# Patient Record
Sex: Male | Born: 1955
Health system: Southern US, Community
[De-identification: ages and names within clinical notes are randomized; demographics above are authoritative.]

## PROBLEM LIST (undated history)

## (undated) DIAGNOSIS — G4730 Sleep apnea, unspecified: Secondary | ICD-10-CM

## (undated) DIAGNOSIS — G510 Bell's palsy: Secondary | ICD-10-CM

## (undated) DIAGNOSIS — D696 Thrombocytopenia, unspecified: Secondary | ICD-10-CM

## (undated) DIAGNOSIS — E7800 Pure hypercholesterolemia, unspecified: Secondary | ICD-10-CM

## (undated) DIAGNOSIS — J309 Allergic rhinitis, unspecified: Secondary | ICD-10-CM

## (undated) DIAGNOSIS — K219 Gastro-esophageal reflux disease without esophagitis: Secondary | ICD-10-CM

## (undated) DIAGNOSIS — J45909 Unspecified asthma, uncomplicated: Secondary | ICD-10-CM

## (undated) DIAGNOSIS — C439 Malignant melanoma of skin, unspecified: Secondary | ICD-10-CM

## (undated) DIAGNOSIS — M109 Gout, unspecified: Secondary | ICD-10-CM

## (undated) DIAGNOSIS — M1990 Unspecified osteoarthritis, unspecified site: Secondary | ICD-10-CM

## (undated) DIAGNOSIS — D369 Benign neoplasm, unspecified site: Secondary | ICD-10-CM

## (undated) HISTORY — DX: Unspecified asthma, uncomplicated: J45.909

## (undated) HISTORY — DX: Malignant melanoma of skin, unspecified: C43.9

## (undated) HISTORY — DX: Allergic rhinitis, unspecified: J30.9

## (undated) HISTORY — PX: MELANOMA EXCISION: SHX5266

## (undated) HISTORY — DX: Bell's palsy: G51.0

## (undated) HISTORY — DX: Pure hypercholesterolemia, unspecified: E78.00

## (undated) HISTORY — PX: NASAL RECONSTRUCTION: SHX2069

## (undated) HISTORY — DX: Thrombocytopenia, unspecified: D69.6

## (undated) HISTORY — DX: Benign neoplasm, unspecified site: D36.9

## (undated) HISTORY — DX: Unspecified osteoarthritis, unspecified site: M19.90

## (undated) HISTORY — DX: Gastro-esophageal reflux disease without esophagitis: K21.9

## (undated) HISTORY — DX: Sleep apnea, unspecified: G47.30

## (undated) HISTORY — DX: Gout, unspecified: M10.9

---

## 1991-05-06 HISTORY — PX: KNEE SURGERY: SHX244

## 1997-05-05 HISTORY — PX: VASECTOMY: SHX75

## 1999-12-11 ENCOUNTER — Emergency Department (HOSPITAL_COMMUNITY): Admission: EM | Admit: 1999-12-11 | Discharge: 1999-12-11 | Payer: Self-pay | Admitting: Emergency Medicine

## 2002-10-16 ENCOUNTER — Ambulatory Visit (HOSPITAL_BASED_OUTPATIENT_CLINIC_OR_DEPARTMENT_OTHER): Admission: RE | Admit: 2002-10-16 | Discharge: 2002-10-16 | Payer: Self-pay | Admitting: Family Medicine

## 2009-08-09 ENCOUNTER — Emergency Department (HOSPITAL_COMMUNITY): Admission: EM | Admit: 2009-08-09 | Discharge: 2009-08-10 | Payer: Self-pay | Admitting: Emergency Medicine

## 2009-08-18 ENCOUNTER — Encounter: Payer: Self-pay | Admitting: Emergency Medicine

## 2009-08-18 ENCOUNTER — Ambulatory Visit: Payer: Self-pay | Admitting: Diagnostic Radiology

## 2009-08-18 ENCOUNTER — Ambulatory Visit (HOSPITAL_COMMUNITY): Admission: EM | Admit: 2009-08-18 | Discharge: 2009-08-19 | Payer: Self-pay | Admitting: Emergency Medicine

## 2009-08-19 ENCOUNTER — Ambulatory Visit: Payer: Self-pay | Admitting: Gastroenterology

## 2009-08-21 ENCOUNTER — Encounter (INDEPENDENT_AMBULATORY_CARE_PROVIDER_SITE_OTHER): Payer: Self-pay | Admitting: *Deleted

## 2009-08-21 ENCOUNTER — Telehealth (INDEPENDENT_AMBULATORY_CARE_PROVIDER_SITE_OTHER): Payer: Self-pay | Admitting: *Deleted

## 2009-08-21 DIAGNOSIS — K222 Esophageal obstruction: Secondary | ICD-10-CM | POA: Insufficient documentation

## 2009-09-13 ENCOUNTER — Ambulatory Visit (HOSPITAL_COMMUNITY): Admission: RE | Admit: 2009-09-13 | Discharge: 2009-09-13 | Payer: Self-pay | Admitting: Gastroenterology

## 2009-09-13 ENCOUNTER — Ambulatory Visit: Payer: Self-pay | Admitting: Gastroenterology

## 2010-06-04 NOTE — Letter (Signed)
Summary: EGD Instructions   Gastroenterology  [address] [place], [place] [address]   Phone: [phone]  Fax: [phone]       [name]    Sep 03, 1955    MRN: [id number]       Procedure Day /Date:09/13/09     Arrival Time: 630 am     Procedure Time:730 am     Location of Procedure:                     X  [facility] ( Outpatient Registration)    PREPARATION FOR ENDOSCOPY   On 09/13/09  THE DAY OF THE PROCEDURE:  1.   No solid foods, milk or milk products are allowed after midnight the night before your procedure.  2.   Do not drink anything colored red or purple.  Avoid juices with pulp.  No orange juice.  3.  You may drink clear liquids until 330 am , which is 4 hours before your procedure.                                                                                                CLEAR LIQUIDS INCLUDE: Water Jello Ice Popsicles Tea (sugar ok, no milk/cream) Powdered fruit flavored drinks Coffee (sugar ok, no milk/cream) Gatorade Juice: apple, white grape, white cranberry  Lemonade Clear bullion, consomm, broth Carbonated beverages (any kind) Strained chicken noodle soup Hard Candy   MEDICATION INSTRUCTIONS  Unless otherwise instructed, you should take regular prescription medications with a small sip of water as early as possible the morning of your procedure             OTHER INSTRUCTIONS  You will need a responsible adult at least 55 years of age to accompany you and drive you home.   This person must remain in the waiting room during your procedure.  Wear loose fitting clothing that is easily removed.  Leave jewelry and other valuables at home.  However, you may wish to bring a book to read or an iPod/MP3 player to listen to music as you wait for your procedure to start.  Remove all body piercing jewelry and leave at home.  Total time from sign-in until discharge is approximately 2-3 hours.  You should go home  directly after your procedure and rest.  You can resume normal activities the day after your procedure.  The day of your procedure you should not:   Drive   Make legal decisions   Operate machinery   Drink alcohol   Return to work  You will receive specific instructions about eating, activities and medications before you leave.    The above instructions have been reviewed and explained to me by   Chales Abrahams CMA Duncan Dull)  August 21, 2009 4:23 PM     I fully understand and can verbalize these instructions over the phone mailed to home Date 08/21/09

## 2010-06-04 NOTE — Procedures (Signed)
Summary: Upper Endoscopy  Patient: [name] Note: All result statuses are Final unless otherwise noted.  Tests: (1) Upper Endoscopy (EGD)   EGD Upper Endoscopy       DONE     [facility]     [address] [place], [place]  [address]           ENDOSCOPY PROCEDURE REPORT           PATIENT:  Joseph Powell, Joseph Powell  MR#:  [id number]     BIRTHDATE:  10-Feb-1956, 53 yrs. old  GENDER:  male     ENDOSCOPIST:  Rachael Fee, MD     PROCEDURE DATE:  09/13/2009     PROCEDURE:  EGD with balloon dilatation     ASA CLASS:  Class II     INDICATIONS:  recent food impaction above Schatzki's ring (2-3     weeks ago); here for dilation     MEDICATIONS:  Fentanyl 100 mcg IV, Versed 7 mg IV     TOPICAL ANESTHETIC:  none           DESCRIPTION OF PROCEDURE:   After the risks benefits and     alternatives of the procedure were thoroughly explained, informed     consent was obtained.  The  endoscope was introduced through the     mouth and advanced to the second portion of the duodenum, without     limitations.  The instrument was slowly withdrawn as the mucosa     was fully examined.     <<PROCEDUREIMAGES>>     There was a minor, partially obstricting Schatzki's ring above     otherwise normal GE junction. This was dilated up to 20mm (60Fr)     with a CRE TTS balloon held inflated for one minute (see image1).     Mild, non-specific bulbar duodenitis (see image3).  Otherwise the     examination was normal (see image2, image4, and image5).     Retroflexed views revealed no abnormalities.    The scope was then     withdrawn from the patient and the procedure completed.           COMPLICATIONS:  None           ENDOSCOPIC IMPRESSION:     1) Schatzki's ring, small. This was dilated up to 20 mm with CRE     TTS balloon     2) Mild, non-specific bulbar duodenitis     3) Otherwise normal examination           RECOMMENDATIONS:     OK to decrease protonix to once daily, best taken 20-30  min     prior to a meal.     Continue to chew food well, eat slowly.     Return for repeat dilation as needed.           ______________________________     Rachael Fee, MD           n.     eSIGNED:   Rachael Fee at 09/13/2009 07:48 AM           Steva Colder, [id number]  Note: An exclamation mark (!) indicates a result that was not dispersed into the flowsheet. Document Creation Date: 09/13/2009 7:49 AM _______________________________________________________________________  (1) Order result status: Final Collection or observation date-time: 09/13/2009 07:42 Requested date-time:  Receipt date-time:  Reported date-time:  Referring Physician:   Ordering Physician: Rob Bunting [phone]) Specimen  Source:  Source: Launa Grill Order Number: [id number] Lab site:

## 2010-06-04 NOTE — Progress Notes (Signed)
Summary: EGD  Phone Note Outgoing Call Call back at [facility] Phone [phone] Call back at Work Phone [phone]   Call placed by: Chales Abrahams CMA Duncan Dull),  August 21, 2009 4:05 PM Summary of Call: left message on machine to call back regarding 2 week repeat of EGD Initial call taken by: Chales Abrahams CMA Duncan Dull),  August 21, 2009 4:06 PM  Follow-up for Phone Call        pt returned call and was scheduled for repeat EGD with Ballon Dil.  Meds reviewed and instructed pt.  Mailed to the home. Follow-up by: Chales Abrahams CMA Duncan Dull),  August 21, 2009 4:22 PM  New Problems: SCHATZKI'S RING (ICD-530.3)   New Problems: SCHATZKI'S RING (ICD-530.3)

## 2010-06-04 NOTE — Procedures (Signed)
Summary: Upper Endoscopy  Patient: Joseph Powell Note: All result statuses are Final unless otherwise noted.  Tests: (1) Upper Endoscopy (EGD)   EGD Upper Endoscopy       DONE     [facility]     [address] [place], [place]  [address]           ENDOSCOPY PROCEDURE REPORT           PATIENT:  Rossie, Bretado  MR#:  [id number]     BIRTHDATE:  1955/09/30, 53 yrs. old  GENDER:  male     ENDOSCOPIST:  Rachael Fee, MD     PROCEDURE DATE:  08/19/2009     PROCEDURE:  EGD with foreign body removal     ASA CLASS:  Class II     INDICATIONS:  food impaction (chicken, while eating dinner several     hours ago)     MEDICATIONS:  Fentanyl 75 mcg IV, Versed 7 mg IV     TOPICAL ANESTHETIC:  Cetacaine Spray           DESCRIPTION OF PROCEDURE:   After the risks benefits and     alternatives of the procedure were thoroughly explained, informed     consent was obtained.  The EG-[id number] ([id number]) endoscope was     introduced through the mouth and advanced to the stomach antrum,     without limitations.  The instrument was slowly withdrawn as the     mucosa was fully examined.     <<PROCEDUREIMAGES>>     There was a large bolus of whitish meat in distal esophagus. This     was able to be gently pushed into the stomach with gastroscope. A     slightly obstructing Schatzki's ring was noted, surrounded by     acute appearing edema (see image001, image002, and image005).     There was retained solid and liquid food in stomach (see     image003).  Otherwise the examination was normal (see image004).     Retroflexed views revealed no abnormalities.    The scope was then     withdrawn from the patient and the procedure completed.     COMPLICATIONS:  None     ENDOSCOPIC IMPRESSION:     1) Large bolus of chicken in distal esophagus, gently pushed     into stomach     2) Mildly obstructive Schat[place]i's type ring in distal esophagus;     acute edema     3) Otherwise normal  examination           RECOMMENDATIONS:     Dr. Christella Hartigan' office will arrange repeat EGD at [facility] hospital in     about 2 weeks to dilate the narrow area of your esophagus.     Until then, take protonix twice daily.     Chew food very well and eat slowly.           ______________________________     Rachael Fee, MD           n.     eSIGNED:   Rachael Fee at 08/19/2009 02:48 AM           Steva Colder, [id number]  Note: An exclamation mark (!) indicates a result that was not dispersed into the flowsheet. Document Creation Date: 08/20/2009 9:08 AM _______________________________________________________________________  (1) Order result status: Final Collection or observation date-time: 08/19/2009 02:40 Requested date-time:  Receipt date-time:  Reported date-time:  Referring Physician:   Ordering Physician: Rob Bunting [phone]) Specimen Source:  Source: Launa Grill Order Number: [id number] Lab site:

## 2011-04-04 ENCOUNTER — Telehealth: Payer: Self-pay | Admitting: Internal Medicine

## 2011-04-04 NOTE — Telephone Encounter (Signed)
Talked to pt and gave him appt, address and telephone

## 2011-04-13 ENCOUNTER — Other Ambulatory Visit: Payer: Self-pay | Admitting: Internal Medicine

## 2011-04-14 ENCOUNTER — Encounter: Payer: Self-pay | Admitting: Internal Medicine

## 2011-04-14 ENCOUNTER — Ambulatory Visit (HOSPITAL_BASED_OUTPATIENT_CLINIC_OR_DEPARTMENT_OTHER): Payer: BC Managed Care – PPO | Admitting: Internal Medicine

## 2011-04-14 ENCOUNTER — Ambulatory Visit: Payer: BC Managed Care – PPO

## 2011-04-14 ENCOUNTER — Other Ambulatory Visit (HOSPITAL_BASED_OUTPATIENT_CLINIC_OR_DEPARTMENT_OTHER): Payer: BC Managed Care – PPO | Admitting: Lab

## 2011-04-14 VITALS — BP 153/96 | HR 74 | Temp 97.7°F | Ht 71.0 in | Wt 246.4 lb

## 2011-04-14 DIAGNOSIS — D696 Thrombocytopenia, unspecified: Secondary | ICD-10-CM

## 2011-04-14 DIAGNOSIS — R69 Illness, unspecified: Secondary | ICD-10-CM

## 2011-04-14 DIAGNOSIS — M2550 Pain in unspecified joint: Secondary | ICD-10-CM

## 2011-04-14 LAB — CBC WITH DIFFERENTIAL/PLATELET
BASO%: 0.4 % (ref 0.0–2.0)
EOS%: 0.6 % (ref 0.0–7.0)
MCH: 30.5 pg (ref 27.2–33.4)
MCV: 85.2 fL (ref 79.3–98.0)
MONO%: 21.9 % — ABNORMAL HIGH (ref 0.0–14.0)
NEUT#: 2 10*3/uL (ref 1.5–6.5)
RBC: 5.08 10*6/uL (ref 4.20–5.82)
RDW: 12.4 % (ref 11.0–14.6)
lymph#: 1.7 10*3/uL (ref 0.9–3.3)

## 2011-04-14 LAB — COMPREHENSIVE METABOLIC PANEL
ALT: 20 U/L (ref 0–53)
AST: 20 U/L (ref 0–37)
Albumin: 4.5 g/dL (ref 3.5–5.2)
Alkaline Phosphatase: 66 U/L (ref 39–117)
Chloride: 108 mEq/L (ref 96–112)
Potassium: 4.3 mEq/L (ref 3.5–5.3)
Sodium: 142 mEq/L (ref 135–145)
Total Protein: 6.8 g/dL (ref 6.0–8.3)

## 2011-04-14 NOTE — Progress Notes (Signed)
REASON FOR CONSULTATION:  55 years old white male with low platelets count.  HPI Joseph Powell is a 55 y.o. male was past medical history significant for gout, dyslipidemia, GERD, sleep apnea and allergic rhinitis. The patient was seen by his primary care physician recently and on blood work was found to have few platelets count. He mentioned that previous bloodwork performed 2-3 years ago also showed low platelets count but it was not as low as the recent one. He was referred to me today for evaluation and recommendation regarding his thrombocytopenia. The patient mentions that he has few episodes of gout recently and he was started on treatment with allopurinol last year he also receive treatment with indomethacin few times last one was almost 3-4 months ago. The patient also has few other medications that can cause thrombocytopenia including Protonix that he has been taken for 3 years he was also recently started on pravastatin, in addition to Relafen on as needed basis. Joseph Powell denied having any bleeding issues, no bruises or ecchymosis. He also denied having any other significant complaints today except for left knee pain. He has no significant weight loss or night sweats, no chest pain or shortness of breath., no cough or hemoptysis, no nausea or vomiting. @SFHPI @  Past Medical History  Diagnosis Date  . Hypercholesteremia     Past Surgical History  Procedure Date  . Knee surgery 1993  . Vasectomy 1999    No family history on file.  Social History History  Substance Use Topics  . Smoking status: Not on file  . Smokeless tobacco: Not on file  . Alcohol Use: Not on file    Allergies  Allergen Reactions  . Penicillins     Current Outpatient Prescriptions  Medication Sig Dispense Refill  . Albuterol Sulfate (PROAIR HFA IN) Inhale 90 mcg into the lungs as needed.        . ALLOPURINOL PO Take by mouth daily.        Marland Kitchen desloratadine (CLARINEX) 5 MG tablet Take 5 mg by  mouth daily.        . nabumetone (RELAFEN) 750 MG tablet Take 750 mg by mouth as needed.        . pantoprazole (PROTONIX) 40 MG tablet Take 40 mg by mouth daily.        . pravastatin (PRAVACHOL) 40 MG tablet Take 40 mg by mouth daily.        Marland Kitchen UNABLE TO FIND as needed. Endomethicine PRN         Review of Systems  A comprehensive review of systems was negative except for: Musculoskeletal: positive for arthralgias  Physical Exam  ZOX:WRUEA, healthy and no distress SKIN: skin color, texture, turgor are normal HEAD: Normocephalic EYES: normal EARS: External ears normal OROPHARYNX:no exudate, no erythema and lips, buccal mucosa, and tongue normal  NECK: supple, no adenopathy LYMPH:  no palpable lymphadenopathy LUNGS: clear to auscultation  HEART: regular rate & rhythm, no murmurs and no gallops ABDOMEN:abdomen soft, non-tender, obese and normal bowel sounds BACK: Back symmetric, no curvature. EXTREMITIES:no joint deformities, effusion, or inflammation, no edema, no skin discoloration, no clubbing  NEURO: alert & oriented x 3 with fluent speech, no focal motor/sensory deficits, gait normal   Assessment: This is a very pleasant 55 years old white male with thrombocytopenia most likely drug-induced. The patient is currently on several medications that can cause of the cytopenia including allopurinol, pravastatin, Protonix in addition to Relafen. I have a lengthy discussion with [name] about his condition. The patient is currently asymptomatic.  Plan: I recommended for him continuous observation for now. I would see him back for followup visit in 6 months with repeat CBC, CMET and LDH. The patient observation as long his platelets count to over 50,000 and he is asymptomatic. He agreed to the current plan.  All questions were answered. The patient knows to call the clinic with any problems, questions or concerns. We can certainly see the patient much sooner if necessary.  Thank you so  much for allowing me to participate in the care of Joseph Powell. I will continue to follow up the patient with you and assist in his care.  Joseph Powell K. 04/14/2011, 11:32 AM

## 2011-10-13 ENCOUNTER — Telehealth: Payer: Self-pay | Admitting: Internal Medicine

## 2011-10-13 ENCOUNTER — Ambulatory Visit (HOSPITAL_BASED_OUTPATIENT_CLINIC_OR_DEPARTMENT_OTHER): Payer: BC Managed Care – PPO | Admitting: Internal Medicine

## 2011-10-13 ENCOUNTER — Other Ambulatory Visit (HOSPITAL_BASED_OUTPATIENT_CLINIC_OR_DEPARTMENT_OTHER): Payer: BC Managed Care – PPO | Admitting: Lab

## 2011-10-13 VITALS — BP 142/91 | HR 73 | Temp 97.4°F | Ht 71.0 in | Wt 244.3 lb

## 2011-10-13 DIAGNOSIS — R69 Illness, unspecified: Secondary | ICD-10-CM

## 2011-10-13 DIAGNOSIS — D696 Thrombocytopenia, unspecified: Secondary | ICD-10-CM

## 2011-10-13 LAB — COMPREHENSIVE METABOLIC PANEL
ALT: 25 U/L (ref 0–53)
AST: 21 U/L (ref 0–37)
CO2: 25 mEq/L (ref 19–32)
Creatinine, Ser: 0.98 mg/dL (ref 0.50–1.35)
Sodium: 140 mEq/L (ref 135–145)
Total Bilirubin: 0.6 mg/dL (ref 0.3–1.2)
Total Protein: 6.8 g/dL (ref 6.0–8.3)

## 2011-10-13 LAB — LACTATE DEHYDROGENASE: LDH: 176 U/L (ref 94–250)

## 2011-10-13 LAB — CBC WITH DIFFERENTIAL/PLATELET
Basophils Absolute: 0.1 10*3/uL (ref 0.0–0.1)
Eosinophils Absolute: 0 10*3/uL (ref 0.0–0.5)
HGB: 15.8 g/dL (ref 13.0–17.1)
MONO#: 1.6 10*3/uL — ABNORMAL HIGH (ref 0.1–0.9)
NEUT#: 2.6 10*3/uL (ref 1.5–6.5)
RDW: 13.2 % (ref 11.0–14.6)
lymph#: 1.5 10*3/uL (ref 0.9–3.3)

## 2011-10-13 NOTE — Progress Notes (Signed)
[facility] [phone]   [phone]  OFFICE PROGRESS NOTE  DIAGNOSIS: Thrombocytopenia, drug induced/ITP   PRIOR THERAPY: None  CURRENT THERAPY: Observation.  INTERVAL HISTORY: Joseph Powell 56 y.o. male returns to the clinic today for six-month followup visit. The patient is feeling fine today with no specific complaints except for arthritis in the right knee. He still on several medications that can cause low platelets count including allopurinol, Relafen and pravastatin. He denied having any bleeding issues. He has no bruises or ecchymosis. No significant change in his weight or night sweats. He has repeat CBC performed earlier today and he is here for evaluation and discussion of his lab results.  MEDICAL HISTORY: Past Medical History  Diagnosis Date  . Hypercholesteremia     ALLERGIES:  is allergic to penicillins.  MEDICATIONS:  Current Outpatient Prescriptions  Medication Sig Dispense Refill  . Albuterol Sulfate (PROAIR HFA IN) Inhale 90 mcg into the lungs as needed.        . ALLOPURINOL PO Take 200 mg by mouth daily.       Marland Kitchen desloratadine (CLARINEX) 5 MG tablet Take 5 mg by mouth daily as needed.       . nabumetone (RELAFEN) 750 MG tablet Take 750 mg by mouth as needed.        . pantoprazole (PROTONIX) 40 MG tablet Take 40 mg by mouth daily.        . pravastatin (PRAVACHOL) 40 MG tablet Take 40 mg by mouth daily.        Marland Kitchen UNABLE TO FIND as needed. Endomethicine PRN         SURGICAL HISTORY:  Past Surgical History  Procedure Date  . Knee surgery 1993  . Vasectomy 1999    REVIEW OF SYSTEMS:  A comprehensive review of systems was negative.   PHYSICAL EXAMINATION: General appearance: alert, cooperative and no distress Neck: no adenopathy Lymph nodes: Cervical, supraclavicular, and axillary nodes normal. Resp: clear to auscultation bilaterally Cardio: regular rate and rhythm, S1, S2 normal, no murmur, click, rub or  gallop GI: soft, non-tender; bowel sounds normal; no masses,  no organomegaly Extremities: extremities normal, atraumatic, no cyanosis or edema Neurologic: Alert and oriented X 3, normal strength and tone. Normal symmetric reflexes. Normal coordination and gait  ECOG PERFORMANCE STATUS: 1 - Symptomatic but completely ambulatory  Blood pressure 142/91, pulse 73, temperature 97.4 F (36.3 C), temperature source Oral, height 5\' 11"  (1.803 m), weight 244 lb 4.8 oz (110.814 kg).  LABORATORY DATA: Lab Results  Component Value Date   WBC 5.8 10/13/2011   HGB 15.8 10/13/2011   HCT 46.2 10/13/2011   MCV 89.8 10/13/2011   PLT 89* 10/13/2011      Chemistry      Component Value Date/Time   NA 142 04/14/2011 1045   K 4.3 04/14/2011 1045   CL 108 04/14/2011 1045   CO2 27 04/14/2011 1045   BUN 12 04/14/2011 1045   CREATININE 0.96 04/14/2011 1045      Component Value Date/Time   CALCIUM 9.1 04/14/2011 1045   ALKPHOS 66 04/14/2011 1045   AST 20 04/14/2011 1045   ALT 20 04/14/2011 1045   BILITOT 0.6 04/14/2011 1045       RADIOGRAPHIC STUDIES: No results found.  ASSESSMENT: This is a very pleasant 56 years old white male with history of thrombocytopenia most likely drug-induced/ITP. The patient is doing fine and currently asymptomatic.  PLAN: I will continue him on  observation for now. I would not consider the patient for treatment unless his platelets count less than 50,000 or he becomes symptomatic with bleeding, bruises or ecchymosis. I would see him back for followup visit in 6 months with repeat CBC and comprehensive metabolic panel. He was advised to call me immediately if he has any concerning symptoms in the interval.  All questions were answered. The patient knows to call the clinic with any problems, questions or concerns. We can certainly see the patient much sooner if necessary.

## 2011-10-13 NOTE — Telephone Encounter (Signed)
gv pt appt for dec2013

## 2012-04-13 ENCOUNTER — Other Ambulatory Visit: Payer: BC Managed Care – PPO | Admitting: Lab

## 2012-04-13 ENCOUNTER — Ambulatory Visit: Payer: BC Managed Care – PPO | Admitting: Internal Medicine

## 2013-09-16 ENCOUNTER — Encounter: Payer: Self-pay | Admitting: *Deleted

## 2013-09-19 ENCOUNTER — Ambulatory Visit (INDEPENDENT_AMBULATORY_CARE_PROVIDER_SITE_OTHER): Payer: BC Managed Care – PPO | Admitting: Pulmonary Disease

## 2013-09-19 ENCOUNTER — Encounter (INDEPENDENT_AMBULATORY_CARE_PROVIDER_SITE_OTHER): Payer: Self-pay

## 2013-09-19 ENCOUNTER — Encounter: Payer: Self-pay | Admitting: Pulmonary Disease

## 2013-09-19 VITALS — BP 140/86 | HR 74 | Temp 98.2°F | Ht 71.0 in | Wt 252.0 lb

## 2013-09-19 DIAGNOSIS — G4733 Obstructive sleep apnea (adult) (pediatric): Secondary | ICD-10-CM

## 2013-09-19 NOTE — Progress Notes (Signed)
Subjective:    Patient ID: Joseph Powell, male    DOB: 04-04-1956, 58 y.o.   MRN: [id number]  HPI The patient is a 58 year old male who I've been asked to see for management of obstructive sleep apnea. He was diagnosed over 10 years ago, and has been on CPAP compliantly since that time. He is using the same machine, and also has aged supplies and a mask. He feels that he sleeps fairly well with the device, and has no issues with mouth opening. He does use a heated humidifier, but is unsure of his pressure. He feels that he is rested in the mornings upon arising, and is satisfied with his alertness during the day. His weight is neutral of the last few years, and his Epworth score today is only 5.   Sleep Questionnaire What time do you typically go to bed?( Between what hours) 12-1am 12-1am at 1544 on 09/19/13 by Lilli Few, CMA How long does it take you to fall asleep? 5 minutes 5 minutes at 1544 on 09/19/13 by Lilli Few, CMA How many times during the night do you wake up? 2 2 at 1544 on 09/19/13 by Lilli Few, CMA What time do you get out of bed to start your day? 0700 0700 at 1544 on 09/19/13 by Lilli Few, CMA Do you drive or operate heavy machinery in your occupation? No No at 1544 on 09/19/13 by Lilli Few, CMA How much has your weight changed (up or down) over the past two years? (In pounds) 0 oz (0 kg) 0 oz (0 kg) at 1544 on 09/19/13 by Lilli Few, CMA Have you ever had a sleep study before? Yes Yes at 1544 on 09/19/13 by Lilli Few, CMA If yes, location of study? [facility] [place] at 1544 on 09/19/13 by Lilli Few, CMA If yes, date of study? 2002 2002 at 1544 on 09/19/13 by Lilli Few, CMA Do you currently use CPAP? Yes Yes at 1544 on 09/19/13 by Lilli Few, CMA If so, what pressure? unknown unknown at 1544 on 09/19/13 by Lilli Few, CMA Do you wear oxygen at  any time? No No at 1544 on 09/19/13 by Lilli Few, CMA   Review of Systems  Constitutional: Negative for fever and unexpected weight change.  HENT: Positive for sneezing. Negative for congestion, dental problem, ear pain, nosebleeds, postnasal drip, rhinorrhea, sinus pressure, sore throat and trouble swallowing.   Eyes: Negative for redness and itching.  Respiratory: Positive for shortness of breath. Negative for cough, chest tightness and wheezing.   Cardiovascular: Negative for palpitations and leg swelling.  Gastrointestinal: Negative for nausea and vomiting.  Genitourinary: Negative for dysuria.  Musculoskeletal: Negative for joint swelling.  Skin: Negative for rash.  Neurological: Negative for headaches.  Hematological: Does not bruise/bleed easily.  Psychiatric/Behavioral: Negative for dysphoric mood. The patient is not nervous/anxious.        Objective:   Physical Exam Constitutional:  Obese male, no acute distress  HENT:  Nares patent without discharge, septal deviation to the left with narrowing.   Oropharynx without exudate, palate and uvula are thick and elongated.  Large tongue.  Eyes:  Perrla, eomi, no scleral icterus  Neck:  No JVD, no TMG  Cardiovascular:  Normal rate, regular rhythm, no rubs or gallops.  No murmurs        Intact distal pulses  Pulmonary :  Normal breath sounds, no stridor or respiratory distress  No rales, rhonchi, or wheezing  Abdominal:  Soft, nondistended, bowel sounds present.  No tenderness noted.   Musculoskeletal:  No lower extremity edema noted.  Lymph Nodes:  No cervical lymphadenopathy noted  Skin:  No cyanosis noted  Neurologic:  Alert, appropriate, moves all 4 extremities without obvious deficit.         Assessment & Plan:

## 2013-09-19 NOTE — Assessment & Plan Note (Signed)
The patient has a history of obstructive sleep apnea, and has done well with CPAP in the past. He is now well overdue for a new CPAP device and also supplies. Will take this opportunity to optimize his pressure again on the automatic setting, and I've also encouraged him to work aggressively on weight loss. If he is doing well with his new setup, I will see him back in one year. I have also stressed to him the importance of aggressive weight loss.

## 2013-09-19 NOTE — Patient Instructions (Signed)
Will get a new cpap machine, and set on the auto setting to optimize your pressure.  If you rather be on a fixed pressure, let me know Work on weight loss followup with me again in one year if doing well.  Please call if having issues with your device.

## 2013-10-12 ENCOUNTER — Encounter: Payer: Self-pay | Admitting: Pulmonary Disease

## 2014-11-17 ENCOUNTER — Telehealth: Payer: Self-pay | Admitting: Pulmonary Disease

## 2014-11-17 NOTE — Telephone Encounter (Signed)
Spoke with pt. He needs a copy of the order we placed in 2015 for his CPAP. This has been mailed to his home address.

## 2015-01-03 LAB — HM COLONOSCOPY

## 2015-09-28 DIAGNOSIS — R7301 Impaired fasting glucose: Secondary | ICD-10-CM | POA: Diagnosis not present

## 2015-09-28 DIAGNOSIS — G5601 Carpal tunnel syndrome, right upper limb: Secondary | ICD-10-CM | POA: Diagnosis not present

## 2015-09-28 DIAGNOSIS — R1013 Epigastric pain: Secondary | ICD-10-CM | POA: Diagnosis not present

## 2015-09-28 DIAGNOSIS — D696 Thrombocytopenia, unspecified: Secondary | ICD-10-CM | POA: Diagnosis not present

## 2015-10-02 ENCOUNTER — Other Ambulatory Visit: Payer: Self-pay | Admitting: Family Medicine

## 2015-10-02 DIAGNOSIS — R1013 Epigastric pain: Secondary | ICD-10-CM

## 2015-10-09 ENCOUNTER — Ambulatory Visit
Admission: RE | Admit: 2015-10-09 | Discharge: 2015-10-09 | Disposition: A | Discharge status: Home / Self Care | Payer: BLUE CROSS/BLUE SHIELD | Source: Ambulatory Visit | Attending: Family Medicine | Admitting: Family Medicine

## 2015-10-09 DIAGNOSIS — R1013 Epigastric pain: Secondary | ICD-10-CM

## 2015-10-10 ENCOUNTER — Other Ambulatory Visit (HOSPITAL_COMMUNITY): Payer: Self-pay | Admitting: Family Medicine

## 2015-10-10 DIAGNOSIS — R1013 Epigastric pain: Secondary | ICD-10-CM

## 2015-10-11 ENCOUNTER — Other Ambulatory Visit (HOSPITAL_COMMUNITY): Payer: Self-pay | Admitting: Family Medicine

## 2015-10-15 DIAGNOSIS — M25512 Pain in left shoulder: Secondary | ICD-10-CM | POA: Diagnosis not present

## 2015-10-17 ENCOUNTER — Ambulatory Visit (HOSPITAL_COMMUNITY): Payer: BLUE CROSS/BLUE SHIELD

## 2015-10-22 DIAGNOSIS — M62830 Muscle spasm of back: Secondary | ICD-10-CM | POA: Diagnosis not present

## 2015-10-22 DIAGNOSIS — M5137 Other intervertebral disc degeneration, lumbosacral region: Secondary | ICD-10-CM | POA: Diagnosis not present

## 2015-10-22 DIAGNOSIS — M545 Low back pain: Secondary | ICD-10-CM | POA: Diagnosis not present

## 2015-10-22 DIAGNOSIS — M9903 Segmental and somatic dysfunction of lumbar region: Secondary | ICD-10-CM | POA: Diagnosis not present

## 2015-10-24 DIAGNOSIS — M545 Low back pain: Secondary | ICD-10-CM | POA: Diagnosis not present

## 2015-10-24 DIAGNOSIS — M5137 Other intervertebral disc degeneration, lumbosacral region: Secondary | ICD-10-CM | POA: Diagnosis not present

## 2015-10-24 DIAGNOSIS — M9903 Segmental and somatic dysfunction of lumbar region: Secondary | ICD-10-CM | POA: Diagnosis not present

## 2015-10-24 DIAGNOSIS — M62830 Muscle spasm of back: Secondary | ICD-10-CM | POA: Diagnosis not present

## 2015-10-25 DIAGNOSIS — M5137 Other intervertebral disc degeneration, lumbosacral region: Secondary | ICD-10-CM | POA: Diagnosis not present

## 2015-10-25 DIAGNOSIS — M545 Low back pain: Secondary | ICD-10-CM | POA: Diagnosis not present

## 2015-10-25 DIAGNOSIS — M62830 Muscle spasm of back: Secondary | ICD-10-CM | POA: Diagnosis not present

## 2015-10-25 DIAGNOSIS — M9903 Segmental and somatic dysfunction of lumbar region: Secondary | ICD-10-CM | POA: Diagnosis not present

## 2015-10-29 DIAGNOSIS — M9903 Segmental and somatic dysfunction of lumbar region: Secondary | ICD-10-CM | POA: Diagnosis not present

## 2015-10-29 DIAGNOSIS — M62830 Muscle spasm of back: Secondary | ICD-10-CM | POA: Diagnosis not present

## 2015-10-29 DIAGNOSIS — M5137 Other intervertebral disc degeneration, lumbosacral region: Secondary | ICD-10-CM | POA: Diagnosis not present

## 2015-10-29 DIAGNOSIS — M545 Low back pain: Secondary | ICD-10-CM | POA: Diagnosis not present

## 2015-11-01 DIAGNOSIS — M25511 Pain in right shoulder: Secondary | ICD-10-CM | POA: Diagnosis not present

## 2015-11-01 DIAGNOSIS — G8929 Other chronic pain: Secondary | ICD-10-CM | POA: Diagnosis not present

## 2015-12-13 DIAGNOSIS — M5137 Other intervertebral disc degeneration, lumbosacral region: Secondary | ICD-10-CM | POA: Diagnosis not present

## 2015-12-13 DIAGNOSIS — M62830 Muscle spasm of back: Secondary | ICD-10-CM | POA: Diagnosis not present

## 2015-12-13 DIAGNOSIS — M545 Low back pain: Secondary | ICD-10-CM | POA: Diagnosis not present

## 2015-12-13 DIAGNOSIS — M9903 Segmental and somatic dysfunction of lumbar region: Secondary | ICD-10-CM | POA: Diagnosis not present

## 2015-12-18 DIAGNOSIS — M1712 Unilateral primary osteoarthritis, left knee: Secondary | ICD-10-CM | POA: Diagnosis not present

## 2015-12-26 DIAGNOSIS — M1712 Unilateral primary osteoarthritis, left knee: Secondary | ICD-10-CM | POA: Diagnosis not present

## 2016-01-02 DIAGNOSIS — M1712 Unilateral primary osteoarthritis, left knee: Secondary | ICD-10-CM | POA: Diagnosis not present

## 2016-02-18 DIAGNOSIS — L570 Actinic keratosis: Secondary | ICD-10-CM | POA: Diagnosis not present

## 2016-02-18 DIAGNOSIS — Z85820 Personal history of malignant melanoma of skin: Secondary | ICD-10-CM | POA: Diagnosis not present

## 2016-02-18 DIAGNOSIS — D235 Other benign neoplasm of skin of trunk: Secondary | ICD-10-CM | POA: Diagnosis not present

## 2016-02-18 DIAGNOSIS — L814 Other melanin hyperpigmentation: Secondary | ICD-10-CM | POA: Diagnosis not present

## 2016-02-18 DIAGNOSIS — D1801 Hemangioma of skin and subcutaneous tissue: Secondary | ICD-10-CM | POA: Diagnosis not present

## 2016-04-16 DIAGNOSIS — M7542 Impingement syndrome of left shoulder: Secondary | ICD-10-CM | POA: Diagnosis not present

## 2016-07-27 ENCOUNTER — Emergency Department (HOSPITAL_COMMUNITY): Payer: BLUE CROSS/BLUE SHIELD

## 2016-07-27 ENCOUNTER — Encounter (HOSPITAL_COMMUNITY): Payer: Self-pay | Admitting: Emergency Medicine

## 2016-07-27 ENCOUNTER — Observation Stay (HOSPITAL_COMMUNITY)
Admission: EM | Admit: 2016-07-27 | Discharge: 2016-07-28 | Disposition: A | Discharge status: Home / Self Care | Payer: BLUE CROSS/BLUE SHIELD | Attending: Internal Medicine | Admitting: Internal Medicine

## 2016-07-27 DIAGNOSIS — K222 Esophageal obstruction: Secondary | ICD-10-CM | POA: Diagnosis not present

## 2016-07-27 DIAGNOSIS — Z79899 Other long term (current) drug therapy: Secondary | ICD-10-CM | POA: Diagnosis not present

## 2016-07-27 DIAGNOSIS — G4733 Obstructive sleep apnea (adult) (pediatric): Secondary | ICD-10-CM | POA: Diagnosis not present

## 2016-07-27 DIAGNOSIS — G510 Bell's palsy: Secondary | ICD-10-CM | POA: Insufficient documentation

## 2016-07-27 DIAGNOSIS — K219 Gastro-esophageal reflux disease without esophagitis: Secondary | ICD-10-CM | POA: Diagnosis present

## 2016-07-27 DIAGNOSIS — R079 Chest pain, unspecified: Secondary | ICD-10-CM | POA: Diagnosis not present

## 2016-07-27 DIAGNOSIS — Z8249 Family history of ischemic heart disease and other diseases of the circulatory system: Secondary | ICD-10-CM | POA: Insufficient documentation

## 2016-07-27 DIAGNOSIS — Z880 Allergy status to penicillin: Secondary | ICD-10-CM | POA: Diagnosis not present

## 2016-07-27 DIAGNOSIS — M19012 Primary osteoarthritis, left shoulder: Secondary | ICD-10-CM | POA: Diagnosis not present

## 2016-07-27 DIAGNOSIS — Z7982 Long term (current) use of aspirin: Secondary | ICD-10-CM | POA: Diagnosis not present

## 2016-07-27 DIAGNOSIS — I10 Essential (primary) hypertension: Secondary | ICD-10-CM | POA: Diagnosis not present

## 2016-07-27 DIAGNOSIS — R0789 Other chest pain: Secondary | ICD-10-CM | POA: Diagnosis present

## 2016-07-27 DIAGNOSIS — Z825 Family history of asthma and other chronic lower respiratory diseases: Secondary | ICD-10-CM | POA: Diagnosis not present

## 2016-07-27 DIAGNOSIS — E7800 Pure hypercholesterolemia, unspecified: Secondary | ICD-10-CM | POA: Insufficient documentation

## 2016-07-27 DIAGNOSIS — Z833 Family history of diabetes mellitus: Secondary | ICD-10-CM | POA: Diagnosis not present

## 2016-07-27 DIAGNOSIS — Z9852 Vasectomy status: Secondary | ICD-10-CM | POA: Diagnosis not present

## 2016-07-27 DIAGNOSIS — D696 Thrombocytopenia, unspecified: Secondary | ICD-10-CM | POA: Diagnosis present

## 2016-07-27 DIAGNOSIS — E785 Hyperlipidemia, unspecified: Secondary | ICD-10-CM | POA: Diagnosis not present

## 2016-07-27 DIAGNOSIS — Z85820 Personal history of malignant melanoma of skin: Secondary | ICD-10-CM | POA: Diagnosis not present

## 2016-07-27 DIAGNOSIS — Z8349 Family history of other endocrine, nutritional and metabolic diseases: Secondary | ICD-10-CM | POA: Insufficient documentation

## 2016-07-27 DIAGNOSIS — M109 Gout, unspecified: Secondary | ICD-10-CM | POA: Diagnosis present

## 2016-07-27 LAB — CBC
HCT: 43.9 % (ref 39.0–52.0)
Hemoglobin: 15 g/dL (ref 13.0–17.0)
MCH: 29.6 pg (ref 26.0–34.0)
MCHC: 34.2 g/dL (ref 30.0–36.0)
MCV: 86.6 fL (ref 78.0–100.0)
PLATELETS: 88 10*3/uL — AB (ref 150–400)
RBC: 5.07 MIL/uL (ref 4.22–5.81)
RDW: 13.8 % (ref 11.5–15.5)
WBC: 6.9 10*3/uL (ref 4.0–10.5)

## 2016-07-27 LAB — TROPONIN I

## 2016-07-27 LAB — BASIC METABOLIC PANEL
Anion gap: 12 (ref 5–15)
BUN: 7 mg/dL (ref 6–20)
CALCIUM: 9.5 mg/dL (ref 8.9–10.3)
CHLORIDE: 102 mmol/L (ref 101–111)
CO2: 24 mmol/L (ref 22–32)
CREATININE: 0.9 mg/dL (ref 0.61–1.24)
GFR calc Af Amer: >60 mL/min (ref >60)
Glucose, Bld: 187 mg/dL — ABNORMAL HIGH (ref 65–99)
POTASSIUM: 3.9 mmol/L (ref 3.5–5.1)
Sodium: 138 mmol/L (ref 135–145)

## 2016-07-27 LAB — I-STAT TROPONIN, ED: TROPONIN I, POC: 0 ng/mL (ref 0.00–0.08)

## 2016-07-27 MED ORDER — ALLOPURINOL 100 MG PO TABS
100.0000 mg | ORAL_TABLET | Freq: Two times a day (BID) | ORAL | Status: DC
  Administered 2016-07-28: 100 mg via ORAL
  Filled 2016-07-27: qty 1

## 2016-07-27 MED ORDER — ASPIRIN 81 MG PO CHEW
324.0000 mg | CHEWABLE_TABLET | Freq: Every day | ORAL | Status: DC
  Administered 2016-07-28: 324 mg via ORAL
  Filled 2016-07-27: qty 4

## 2016-07-27 MED ORDER — MORPHINE SULFATE (PF) 4 MG/ML IV SOLN: 2.0000 mg | INTRAVENOUS | Status: DC | PRN

## 2016-07-27 MED ORDER — ZOLPIDEM TARTRATE 5 MG PO TABS: 5.0000 mg | ORAL_TABLET | Freq: Every evening | ORAL | Status: DC | PRN

## 2016-07-27 MED ORDER — NITROGLYCERIN 0.4 MG SL SUBL: 0.4000 mg | SUBLINGUAL_TABLET | SUBLINGUAL | Status: DC | PRN

## 2016-07-27 MED ORDER — DM-GUAIFENESIN ER 30-600 MG PO TB12: 1.0000 | ORAL_TABLET | Freq: Two times a day (BID) | ORAL | Status: DC | PRN

## 2016-07-27 MED ORDER — SODIUM CHLORIDE 0.9 % IV SOLN
INTRAVENOUS | Status: DC
  Administered 2016-07-27: 1000 mL via INTRAVENOUS

## 2016-07-27 MED ORDER — ACETAMINOPHEN 325 MG PO TABS: 650.0000 mg | ORAL_TABLET | ORAL | Status: DC | PRN

## 2016-07-27 MED ORDER — PANTOPRAZOLE SODIUM 40 MG PO TBEC
40.0000 mg | DELAYED_RELEASE_TABLET | Freq: Every day | ORAL | Status: DC
  Administered 2016-07-28: 40 mg via ORAL
  Filled 2016-07-27: qty 1

## 2016-07-27 MED ORDER — LORATADINE 10 MG PO TABS
10.0000 mg | ORAL_TABLET | Freq: Every day | ORAL | Status: DC
  Administered 2016-07-28: 10 mg via ORAL
  Filled 2016-07-27: qty 1

## 2016-07-27 MED ORDER — ASPIRIN 81 MG PO CHEW
324.0000 mg | CHEWABLE_TABLET | Freq: Once | ORAL | Status: AC
  Administered 2016-07-27: 324 mg via ORAL
  Filled 2016-07-27: qty 4

## 2016-07-27 MED ORDER — TURMERIC 500 MG PO CAPS: 1.0000 | ORAL_CAPSULE | Freq: Every day | ORAL | Status: DC

## 2016-07-27 MED ORDER — ONDANSETRON HCL 4 MG/2ML IJ SOLN: 4.0000 mg | Freq: Four times a day (QID) | INTRAMUSCULAR | Status: DC | PRN

## 2016-07-27 MED ORDER — GLUCOSAMINE-CHONDROITIN 500-400 MG PO TABS: 1.0000 | ORAL_TABLET | Freq: Every evening | ORAL | Status: DC

## 2016-07-27 MED ORDER — NABUMETONE 750 MG PO TABS
750.0000 mg | ORAL_TABLET | Freq: Two times a day (BID) | ORAL | Status: DC | PRN
  Filled 2016-07-27: qty 1

## 2016-07-27 MED ORDER — ENOXAPARIN SODIUM 40 MG/0.4ML ~~LOC~~ SOLN: 40.0000 mg | SUBCUTANEOUS | Status: DC

## 2016-07-27 MED ORDER — PRAVASTATIN SODIUM 40 MG PO TABS: 40.0000 mg | ORAL_TABLET | Freq: Every day | ORAL | Status: DC

## 2016-07-27 MED ORDER — ALBUTEROL SULFATE (2.5 MG/3ML) 0.083% IN NEBU: 3.0000 mL | INHALATION_SOLUTION | Freq: Four times a day (QID) | RESPIRATORY_TRACT | Status: DC | PRN

## 2016-07-27 NOTE — Progress Notes (Signed)
PHARMACIST - PHYSICIAN ORDER COMMUNICATION  CONCERNING: P&T Medication Policy on Herbal Medications  DESCRIPTION:  This patient's orders for:  turmeric and glucosamine-chondroitin  has been noted.  This product(s) is classified as an "herbal" or natural product. Due to a lack of definitive safety studies or FDA approval, nonstandard manufacturing practices, plus the potential risk of unknown drug-drug interactions while on inpatient medications, the Pharmacy and Therapeutics Committee does not permit the use of "herbal" or natural products of this type within [facility].   ACTION TAKEN: The pharmacy department is unable to verify this order at this time and your patient has been informed of this safety policy. Please reevaluate patient's clinical condition at discharge and address if the herbal or natural product(s) should be resumed at that time.

## 2016-07-27 NOTE — ED Triage Notes (Signed)
Pt c/o left arm pain, neck pain and shortness of breath onset this afternoon. Pt reports he just had lunch and when he came home he began to have the symptoms. Pt tried Relafen but then began to have center chest pain.

## 2016-07-27 NOTE — ED Provider Notes (Signed)
[facility] DEPT Provider Note   CSN: [id number] Arrival date & time: 07/27/16  1708     History   Chief Complaint Chief Complaint  Patient presents with  . Arm Pain  . Neck Pain  . Shortness of Breath    HPI Joseph Powell is a 61 y.o. male PMH HLD, non-smoker presents for left chest pain. Pt has significant osteoarthritis of left shoulder, which is normally sharp with ROM. This afternoon, around 1400 pt began to feel severe dull pain to left shoulder. Pain radiated to left neck, down left arm and to left chest. Associated labored breathing. Pain not worse with moving shoulder. No relief with Relafen. No diaphoresis, N/V. Pain self resolved after 2.5-3 hours. Non-exertional. He has never experienced pain like this before.   The history is provided by the patient and the spouse.  Chest Pain   This is a new problem. The current episode started 3 to 5 hours ago. The problem occurs constantly. The problem has been resolved. The pain is associated with rest. The pain is present in the lateral region. The pain is severe. The quality of the pain is described as dull, heavy and pressure-like. The pain radiates to the left neck and left arm. Duration of episode(s) is 3 hours. Associated symptoms include shortness of breath. Pertinent negatives include no abdominal pain, no back pain, no claudication, no cough, no diaphoresis, no dizziness, no exertional chest pressure, no fever, no headaches, no hemoptysis, no leg pain, no lower extremity edema, no malaise/fatigue, no nausea, no near-syncope, no numbness, no orthopnea, no palpitations, no syncope, no vomiting and no weakness. He has tried rest for the symptoms. Risk factors include male gender.  His past medical history is significant for hyperlipidemia.  Pertinent negatives for past medical history include no CAD, no COPD, no diabetes, no DVT, no hypertension, no MI, no PE, no recent injury, no seizures, no stimulant use and no TIA.    Pertinent negatives for family medical history include: no CAD.  Procedure history is negative for cardiac catheterization, echocardiogram and stress echo.    Past Medical History:  Diagnosis Date  . Allergic rhinitis   . Bell's palsy   . GERD (gastroesophageal reflux disease)   . Gout   . Hypercholesteremia   . Melanoma ([place])   . Osteoarthritis   . Sleep apnea   . Tubular adenoma     Patient Active Problem List   Diagnosis Date Noted  . HLD (hyperlipidemia) 07/27/2016  . GERD (gastroesophageal reflux disease) 07/27/2016  . Gout 07/27/2016  . Discomfort in chest 07/27/2016  . OSA (obstructive sleep apnea) 09/19/2013  . Thrombocytopenia ([place]) 04/14/2011  . SCHATZKI'S RING 08/21/2009    Past Surgical History:  Procedure Laterality Date  . KNEE SURGERY  1993  . MELANOMA EXCISION    . NASAL RECONSTRUCTION    . VASECTOMY  1999       Home Medications    Prior to Admission medications   Medication Sig Start Date End Date Taking? Authorizing Provider  albuterol (PROVENTIL HFA;VENTOLIN HFA) 108 (90 Base) MCG/ACT inhaler Inhale 1-2 puffs into the lungs every 6 (six) hours as needed for wheezing or shortness of breath.   Yes Historical Provider, MD  allopurinol (ZYLOPRIM) 100 MG tablet Take 100 mg by mouth 2 (two) times daily.   Yes Historical Provider, MD  desloratadine (CLARINEX) 5 MG tablet Take 5 mg by mouth daily.    Yes Historical Provider, MD  glucosamine-chondroitin 500-400 MG tablet Take 1  tablet by mouth every evening.    Yes Historical Provider, MD  indomethacin (INDOCIN) 25 MG capsule Take 25 mg by mouth 2 (two) times daily with a meal.   Yes Historical Provider, MD  nabumetone (RELAFEN) 750 MG tablet Take 750 mg by mouth 2 (two) times daily as needed for mild pain.    Yes Historical Provider, MD  pantoprazole (PROTONIX) 40 MG tablet Take 40 mg by mouth daily.    Yes Historical Provider, MD  pravastatin (PRAVACHOL) 40 MG tablet Take 40 mg by mouth daily.     Yes  Historical Provider, MD  TURMERIC PO Take 1 tablet by mouth daily.   Yes Historical Provider, MD    Family History Family History  Problem Relation Age of Onset  . Diabetes Mother   . Asthma Mother   . Gout Brother     Social History Social History  Substance Use Topics  . Smoking status: Never Smoker  . Smokeless tobacco: Never Used  . Alcohol use 1.0 oz/week    2 Standard drinks or equivalent per week     Allergies   Penicillins   Review of Systems Review of Systems  Constitutional: Negative for diaphoresis, fever and malaise/fatigue.  Respiratory: Positive for shortness of breath. Negative for cough and hemoptysis.   Cardiovascular: Positive for chest pain. Negative for palpitations, orthopnea, claudication, syncope and near-syncope.  Gastrointestinal: Negative for abdominal pain, nausea and vomiting.  Musculoskeletal: Negative for back pain, neck pain and neck stiffness.  Allergic/Immunologic: Negative for immunocompromised state.  Neurological: Negative for dizziness, seizures, weakness, numbness and headaches.  All other systems reviewed and are negative.    Physical Exam Updated Vital Signs BP (!) 154/87 (BP Location: Right Arm)   Pulse 81   Temp 97.6 F (36.4 C) (Oral)   Resp 18   Ht 5\' 11"  (1.803 m)   Wt 98.3 kg   SpO2 100%   BMI 30.24 kg/m   Physical Exam  Constitutional: He is oriented to person, place, and time. He appears well-developed and well-nourished.  HENT:  Head: Normocephalic and atraumatic.  Eyes: Conjunctivae and EOM are normal.  Neck: Normal range of motion. Neck supple.  Cardiovascular: Normal rate, regular rhythm and normal heart sounds.   No murmur heard. Pulmonary/Chest: Effort normal and breath sounds normal. No respiratory distress.  Abdominal: Soft. There is no tenderness.  Musculoskeletal: Normal range of motion. He exhibits no edema.  Neurological: He is alert and oriented to person, place, and time.  Skin: Skin is warm  and dry. Capillary refill takes less than 2 seconds.  Psychiatric: He has a normal mood and affect.  Nursing note and vitals reviewed.    ED Treatments / Results  Labs (all labs ordered are listed, but only abnormal results are displayed) Labs Reviewed  BASIC METABOLIC PANEL - Abnormal; Notable for the following:       Result Value   Glucose, Bld 187 (*)    All other components within normal limits  CBC - Abnormal; Notable for the following:    Platelets 88 (*)    All other components within normal limits  TROPONIN I  RAPID URINE DRUG SCREEN, HOSP PERFORMED  TROPONIN I  HEMOGLOBIN A1C  TROPONIN I  TROPONIN I  HIV ANTIBODY (ROUTINE TESTING)  LIPID PANEL  I-STAT TROPOININ, ED    EKG  EKG Interpretation  Date/Time:  Sunday July 27 2016 17:13:49 EDT Ventricular Rate:  100 PR Interval:  160 QRS Duration: 94 QT Interval:  352  QTC Calculation: 762 R Axis:   49 Text Interpretation:  Normal sinus rhythm Inferior infarct , age undetermined Anterolateral infarct , age undetermined Abnormal ECG No old tracing to compare Confirmed by Winfred Leeds  MD, SAM [phone]) on 07/27/2016 9:57:20 PM       Radiology Dg Chest 2 View  Result Date: 07/27/2016 CLINICAL DATA:  Pt c/o left arm pain, neck pain and shortness of breath onset this afternoon. Pt reports he just had lunch and when he came home he began to have the symptoms. Pt tried Relafen but then began to have center chest pain. Button artifact over the right upper chest. EXAM: CHEST  2 VIEW COMPARISON:  05/28/2012 FINDINGS: Heart size is normal. The lungs are free of focal consolidations and pleural effusions. Button overlies the right upper chest. IMPRESSION: No active cardiopulmonary disease. Electronically Signed   By: Nolon Nations M.D.   On: 07/27/2016 17:42    Procedures Procedures (including critical care time)  Medications Ordered in ED Medications  aspirin chewable tablet 324 mg (not administered)  albuterol  (PROVENTIL) (2.5 MG/3ML) 0.083% nebulizer solution 3 mL (not administered)  allopurinol (ZYLOPRIM) tablet 100 mg (not administered)  loratadine (CLARITIN) tablet 10 mg (not administered)  nabumetone (RELAFEN) tablet 750 mg (not administered)  pantoprazole (PROTONIX) EC tablet 40 mg (not administered)  pravastatin (PRAVACHOL) tablet 40 mg (not administered)  nitroGLYCERIN (NITROSTAT) SL tablet 0.4 mg (not administered)  morphine 4 MG/ML injection 2 mg (not administered)  0.9 %  sodium chloride infusion (1,000 mLs Intravenous New Bag/Given 07/27/16 2355)  acetaminophen (TYLENOL) tablet 650 mg (not administered)  ondansetron (ZOFRAN) injection 4 mg (not administered)  zolpidem (AMBIEN) tablet 5 mg (not administered)  dextromethorphan-guaiFENesin (MUCINEX DM) 30-600 MG per 12 hr tablet 1 tablet (not administered)  aspirin chewable tablet 324 mg (324 mg Oral Given 07/27/16 2220)     Initial Impression / Assessment and Plan / ED Course  I have reviewed the triage vital signs and the nursing notes.  Pertinent labs & imaging results that were available during my care of the patient were reviewed by me and considered in my medical decision making (see chart for details).    61 y.o. male presents with left chest pain concerning for ACS. Symptoms resolved PTA. VSS, no hypoxia or tachypnea. Well's negative, unable to [facility] due to age, low suspicion for PE. HEAR score 4.  - EKG NSR, no ischemic changes - CXR unremarkable - thrombocytopenia, c/w baseline. Hb stable 15. BMP wnl - delta troponin negative Due to moderate HEAR score and concerning HPI will admit to r/o cardiac etiology.   Final Clinical Impressions(s) / ED Diagnoses   Final diagnoses:  Chest pain at rest    New Prescriptions Current Discharge Medication List       Monico Blitz, MD 07/28/16 0145    Orlie Dakin, MD 07/29/16 1054

## 2016-07-27 NOTE — H&P (Signed)
History and Physical    Joseph Powell QTM:[id number] DOB: February 08, 1956 DOA: 07/27/2016  Referring MD/NP/PA:   PCP: Gennette Pac, MD   Patient coming from:  The patient is coming from home.  At baseline, pt is independent for most of ADL.   Chief Complaint: Chest discomfort, Left arm pain, left and neck pain, shortness of breath  HPI: Joseph Powell is a 61 y.o. male with medical history significant of hyperlipidemia, GERD, gout, OSA on CPAP, melanoma, Bell's palsy, thrombocytopenia, Schatzki's ring (s/p of dilation), who presents with chest discomfort, Left arm pain, left and neck pain, shortness of breath.  Patient states that he started having left arm pain, neck pain and shortness of breath this afternoon after he had lunch.  Pt tried Relafen, but then began to have chest discomfort, not chest pain. The pain in his left arm and neck was sharp, nonradiating, 8 out of 10 in severity. His symptoms lasted for about 2-3 hours, then spontaneously resolved. Currently he does not have chest pain, chest discomfort or SOB. Denies long distant traveling. No tenderness in calf areas. Patient states that he has intermittent dry cough for several months, which has not changed today. No fever or chills. Patient denies nausea, vomiting, diarrhea, abdominal pain, symptoms of UTI or unilateral weakness.   ED Course: pt was found to have negative troponin, WBC 6.9, platelet 88 which was 89 on 10/13/11, electrolytes and renal function okay, temperature 99.1, tachycardia, O2 sat 93-95% on room air. Negative chest x-ray. Patient is placed on telemetry bed for observation.  Review of Systems:   General: no fevers, chills, no changes in body weight. HEENT: no blurry vision, hearing changes or sore throat Respiratory: has dyspnea, coughing, no wheezing CV: has chest discomfort. no palpitations GI: no nausea, vomiting, abdominal pain, diarrhea, constipation GU: no dysuria, burning on urination, increased  urinary frequency, hematuria  Ext: no leg edema Neuro: no unilateral weakness, numbness, or tingling, no vision change or hearing loss Skin: no rash, no skin tear. MSK: had left arm and neck pain. Has left knee pain Heme: No easy bruising.  Travel history: No recent long distant travel.  Allergy:  Allergies  Allergen Reactions  . Penicillins Rash    Past Medical History:  Diagnosis Date  . Allergic rhinitis   . Bell's palsy   . GERD (gastroesophageal reflux disease)   . Gout   . Hypercholesteremia   . Melanoma ([place])   . Osteoarthritis   . Sleep apnea   . Tubular adenoma     Past Surgical History:  Procedure Laterality Date  . KNEE SURGERY  1993  . MELANOMA EXCISION    . NASAL RECONSTRUCTION    . VASECTOMY  1999    Social History:  reports that he has never smoked. He has never used smokeless tobacco. He reports that he drinks about 1.0 oz of alcohol per week . He reports that he does not use drugs.  Family History:  Family History  Problem Relation Age of Onset  . Diabetes Mother   . Asthma Mother   . Gout Brother      Prior to Admission medications   Medication Sig Start Date End Date Taking? Authorizing Provider  albuterol (PROVENTIL HFA;VENTOLIN HFA) 108 (90 Base) MCG/ACT inhaler Inhale 1-2 puffs into the lungs every 6 (six) hours as needed for wheezing or shortness of breath.   Yes Historical Provider, MD  allopurinol (ZYLOPRIM) 100 MG tablet Take 100 mg by mouth 2 (two) times daily.  Yes Historical Provider, MD  desloratadine (CLARINEX) 5 MG tablet Take 5 mg by mouth daily.    Yes Historical Provider, MD  glucosamine-chondroitin 500-400 MG tablet Take 1 tablet by mouth every evening.    Yes Historical Provider, MD  indomethacin (INDOCIN) 25 MG capsule Take 25 mg by mouth 2 (two) times daily with a meal.   Yes Historical Provider, MD  nabumetone (RELAFEN) 750 MG tablet Take 750 mg by mouth 2 (two) times daily as needed for mild pain.    Yes Historical  Provider, MD  pantoprazole (PROTONIX) 40 MG tablet Take 40 mg by mouth daily.    Yes Historical Provider, MD  pravastatin (PRAVACHOL) 40 MG tablet Take 40 mg by mouth daily.     Yes Historical Provider, MD  TURMERIC PO Take 1 tablet by mouth daily.   Yes Historical Provider, MD    Physical Exam: Vitals:   07/27/16 2015 07/27/16 2115 07/27/16 2215 07/27/16 2300  BP: 137/71 (!) 145/79 136/81 (!) 154/78  Pulse: 72 73 76 74  Resp: (!) 24 12 18  (!) 24  Temp:      TempSrc:      SpO2: 95% 95% 97% 96%  Weight:      Height:       General: Not in acute distress HEENT:       Eyes: PERRL, EOMI, no scleral icterus.       ENT: No discharge from the ears and nose, no pharynx injection, no tonsillar enlargement.        Neck: No JVD, no bruit, no mass felt. Heme: No neck lymph node enlargement. Cardiac: S1/S2, RRR, No murmurs, No gallops or rubs. Respiratory: No rales, wheezing, rhonchi or rubs. GI: Soft, nondistended, nontender, no rebound pain, no organomegaly, BS present. GU: No hematuria Ext: No pitting leg edema bilaterally. 2+DP/PT pulse bilaterally. Musculoskeletal: No joint deformities, No joint redness or warmth, no limitation of ROM in spin. Skin: No rashes.  Neuro: Alert, oriented X3, cranial nerves II-XII grossly intact, moves all extremities normally.  Psych: Patient is not psychotic, no suicidal or hemocidal ideation.  Labs on Admission: I have personally reviewed following labs and imaging studies  CBC:  Recent Labs Lab 07/27/16 1717  WBC 6.9  HGB 15.0  HCT 43.9  MCV 86.6  PLT 88*   Basic Metabolic Panel:  Recent Labs Lab 07/27/16 1717  NA 138  K 3.9  CL 102  CO2 24  GLUCOSE 187*  BUN 7  CREATININE 0.90  CALCIUM 9.5   GFR: Estimated Creatinine Clearance: 107.3 mL/min (by C-G formula based on SCr of 0.9 mg/dL). Liver Function Tests: No results for input(s): AST, ALT, ALKPHOS, BILITOT, PROT, ALBUMIN in the last 168 hours. No results for input(s): LIPASE,  AMYLASE in the last 168 hours. No results for input(s): AMMONIA in the last 168 hours. Coagulation Profile: No results for input(s): INR, PROTIME in the last 168 hours. Cardiac Enzymes:  Recent Labs Lab 07/27/16 2022  TROPONINI <0.03   BNP (last 3 results) No results for input(s): PROBNP in the last 8760 hours. HbA1C: No results for input(s): HGBA1C in the last 72 hours. CBG: No results for input(s): GLUCAP in the last 168 hours. Lipid Profile: No results for input(s): CHOL, HDL, LDLCALC, TRIG, CHOLHDL, LDLDIRECT in the last 72 hours. Thyroid Function Tests: No results for input(s): TSH, T4TOTAL, FREET4, T3FREE, THYROIDAB in the last 72 hours. Anemia Panel: No results for input(s): VITAMINB12, FOLATE, FERRITIN, TIBC, IRON, RETICCTPCT in the last 72 hours.  Urine analysis: No results found for: COLORURINE, APPEARANCEUR, LABSPEC, PHURINE, GLUCOSEU, HGBUR, BILIRUBINUR, KETONESUR, PROTEINUR, UROBILINOGEN, NITRITE, LEUKOCYTESUR Sepsis Labs: @LABRCNTIP (procalcitonin:4,lacticidven:4) )No results found for this or any previous visit (from the past 240 hour(s)).   Radiological Exams on Admission: Dg Chest 2 View  Result Date: 07/27/2016 CLINICAL DATA:  Pt c/o left arm pain, neck pain and shortness of breath onset this afternoon. Pt reports he just had lunch and when he came home he began to have the symptoms. Pt tried Relafen but then began to have center chest pain. Button artifact over the right upper chest. EXAM: CHEST  2 VIEW COMPARISON:  05/28/2012 FINDINGS: Heart size is normal. The lungs are free of focal consolidations and pleural effusions. Button overlies the right upper chest. IMPRESSION: No active cardiopulmonary disease. Electronically Signed   By: Nolon Nations M.D.   On: 07/27/2016 17:42     EKG: Independently reviewed.  Sinus rhythm, QTC 455, inferior infarction pattern, poor R-wave progression, anteroseptal infarction pattern  Assessment/Plan Principal Problem:    Discomfort in chest Active Problems:   SCHATZKI'S RING   Thrombocytopenia ([facility])   OSA (obstructive sleep apnea)   HLD (hyperlipidemia)   GERD (gastroesophageal reflux disease)   Gout   Discomfort in chest: Patient had atypical chest discomfort, described chest discomfort, left arm pain and the neck pain. Currently symptoms have resolved. No signs of DVT, less likely to have PE. Chest x-ray negative for pneumonia. DD include ACS, esophageal spasm given history of Schatzki's ring, acid reflux.  - will place on Tele bed for obs - cycle CE q6 x3 and repeat EKG in the am  - Nitroglycerin, Morphine, and aspirin, pravastatin - Risk factor stratification: will check FLP, UDS and A1C  - 2d echo  GERD: -Protonix  Gout: -continue home allopurinol  OSA: -CPAP  Thrombocytopenia ([place]): unclear etiology.Platelet stable, 89 on 10/13/11--> 88 today. No bleeding tendency. -Follow-up by CBC  HLD: Last LDL was not on record -Continue home medications: Pravastatin -f/u FLP   DVT ppx: SCD Code Status: Full code Family Communication: Yes, patient's wife at bed side Disposition Plan:  Anticipate discharge back to previous home environment Consults called:  none Admission status: Obs / tele       Date of Service 07/27/2016    Ivor Costa [facility] Pager [phone]  If 7PM-7AM, please contact night-coverage [email or web address] Password [email or web address] 07/27/2016, 11:14 PM

## 2016-07-28 ENCOUNTER — Other Ambulatory Visit: Payer: Self-pay | Admitting: Physician Assistant

## 2016-07-28 ENCOUNTER — Telehealth (HOSPITAL_COMMUNITY): Payer: Self-pay | Admitting: *Deleted

## 2016-07-28 ENCOUNTER — Observation Stay (HOSPITAL_BASED_OUTPATIENT_CLINIC_OR_DEPARTMENT_OTHER): Payer: BLUE CROSS/BLUE SHIELD

## 2016-07-28 DIAGNOSIS — E782 Mixed hyperlipidemia: Secondary | ICD-10-CM | POA: Diagnosis not present

## 2016-07-28 DIAGNOSIS — K219 Gastro-esophageal reflux disease without esophagitis: Secondary | ICD-10-CM | POA: Diagnosis not present

## 2016-07-28 DIAGNOSIS — D696 Thrombocytopenia, unspecified: Secondary | ICD-10-CM | POA: Diagnosis not present

## 2016-07-28 DIAGNOSIS — R0789 Other chest pain: Secondary | ICD-10-CM | POA: Diagnosis not present

## 2016-07-28 DIAGNOSIS — K222 Esophageal obstruction: Secondary | ICD-10-CM | POA: Diagnosis not present

## 2016-07-28 LAB — TROPONIN I
Troponin I: <0.03 ng/mL (ref <0.03)
Troponin I: <0.03 ng/mL (ref <0.03)

## 2016-07-28 LAB — RAPID URINE DRUG SCREEN, HOSP PERFORMED
AMPHETAMINES: NOT DETECTED
BENZODIAZEPINES: NOT DETECTED
Barbiturates: NOT DETECTED
COCAINE: NOT DETECTED
OPIATES: NOT DETECTED
Tetrahydrocannabinol: NOT DETECTED

## 2016-07-28 LAB — LIPID PANEL
CHOL/HDL RATIO: 10.1 ratio
Cholesterol: 111 mg/dL (ref 0–200)
HDL: 11 mg/dL — AB (ref >40)
LDL CALC: 42 mg/dL (ref 0–99)
Triglycerides: 288 mg/dL — ABNORMAL HIGH (ref <150)
VLDL: 58 mg/dL — AB (ref 0–40)

## 2016-07-28 LAB — ECHOCARDIOGRAM COMPLETE
HEIGHTINCHES: 71 in
WEIGHTICAEL: 3457.6 [oz_av]

## 2016-07-28 LAB — HIV ANTIBODY (ROUTINE TESTING W REFLEX): HIV SCREEN 4TH GENERATION: NONREACTIVE

## 2016-07-28 LAB — LIPASE, BLOOD: LIPASE: 14 U/L (ref 11–51)

## 2016-07-28 MED ORDER — ASPIRIN EC 81 MG PO TBEC: 81.0000 mg | DELAYED_RELEASE_TABLET | Freq: Every day | ORAL | Status: DC

## 2016-07-28 NOTE — ED Provider Notes (Signed)
Patient had episode this afternoon of left shoulder pain left-sided neck pain and vague anterior chest discomfort accompanied by lightheadedness which lasted 2 hours and resolved spontaneously. He's never had anything similar before. Symptoms resolved spontaneously without treatment. On exam no distress lungs clear auscultation heart regular rate and rhythm no supple no JVD  or bruit. Patient's story suspicious for angina. Has abnormal EKG, heart score equals 4   Orlie Dakin, MD 07/28/16 0104

## 2016-07-28 NOTE — Progress Notes (Signed)
  Echocardiogram 2D Echocardiogram has been performed.  Joseph Powell 07/28/2016, 2:38 PM

## 2016-07-28 NOTE — Discharge Summary (Signed)
Physician Discharge Summary  OBRIEN Powell SEG:[id number] DOB: 1955/06/07 DOA: 07/27/2016  PCP: Gennette Pac, MD  Admit date: 07/27/2016 Discharge date: 07/28/2016   Recommendations for Outpatient Follow-Up:   1. Echo results pending 2. Outpatient stress test 3. Avoid NSAIDS   Discharge Diagnosis:   Principal Problem:   Discomfort in chest Active Problems:   SCHATZKI'S RING   Thrombocytopenia (HCC)   OSA (obstructive sleep apnea)   HLD (hyperlipidemia)   GERD (gastroesophageal reflux disease)   Gout   Discharge disposition:  Home  Discharge Condition: Improved.  Diet recommendation: Low sodium, heart healthy.  Carbohydrate-modified.  Wound care: None.   History of Present Illness:   Joseph Powell is a 61 y.o. male with medical history significant of hyperlipidemia, GERD, gout, OSA on CPAP, melanoma, Bell's palsy, thrombocytopenia, Schatzki's ring (s/p of dilation), who presents with chest discomfort, Left arm pain, left and neck pain, shortness of breath.  Hospital Course by Problem:   Discomfort in chest: -atypical chest discomfort -CE negative -outpatient stress test  GERD: -Protonix  Gout: -continue home allopurinol  OSA: -CPAP  Thrombocytopenia ([place]): unclear etiology.Platelet stable, 89 on 10/13/11--> 88 today. No bleeding tendency.  HLD:  -Continue home medications: Pravastatin     Medical Consultants:    cards   Discharge Exam:   Vitals:   07/28/16 0513 07/28/16 0929  BP: (!) 148/84 (!) 153/90  Pulse: 76 71  Resp: 18 18  Temp: 98.1 F (36.7 C) 97.4 F (36.3 C)   Vitals:   07/27/16 2345 07/28/16 0030 07/28/16 0513 07/28/16 0929  BP: (!) 154/87  (!) 148/84 (!) 153/90  Pulse: 76 81 76 71  Resp: 18 18 18 18   Temp: 97.6 F (36.4 C)  98.1 F (36.7 C) 97.4 F (36.3 C)  TempSrc: Oral  Oral Oral  SpO2: 100% 100% 95% 98%  Weight: 98.3 kg (216 lb 12.8 oz)  98 kg (216 lb 1.6 oz)   Height: 5\' 11"  (1.803 m)        Gen:  NAD    The results of significant diagnostics from this hospitalization (including imaging, microbiology, ancillary and laboratory) are listed below for reference.     Procedures and Diagnostic Studies:   Dg Chest 2 View  Result Date: 07/27/2016 CLINICAL DATA:  Pt c/o left arm pain, neck pain and shortness of breath onset this afternoon. Pt reports he just had lunch and when he came home he began to have the symptoms. Pt tried Relafen but then began to have center chest pain. Button artifact over the right upper chest. EXAM: CHEST  2 VIEW COMPARISON:  05/28/2012 FINDINGS: Heart size is normal. The lungs are free of focal consolidations and pleural effusions. Button overlies the right upper chest. IMPRESSION: No active cardiopulmonary disease. Electronically Signed   By: Nolon Nations M.D.   On: 07/27/2016 17:42     Labs:   Basic Metabolic Panel:  Recent Labs Lab 07/27/16 1717  NA 138  K 3.9  CL 102  CO2 24  GLUCOSE 187*  BUN 7  CREATININE 0.90  CALCIUM 9.5   GFR Estimated Creatinine Clearance: 104.2 mL/min (by C-G formula based on SCr of 0.9 mg/dL). Liver Function Tests: No results for input(s): AST, ALT, ALKPHOS, BILITOT, PROT, ALBUMIN in the last 168 hours.  Recent Labs Lab 07/28/16 1029  LIPASE 14   No results for input(s): AMMONIA in the last 168 hours. Coagulation profile No results for input(s): INR, PROTIME in the last 168 hours.  CBC:  Recent Labs Lab 07/27/16 1717  WBC 6.9  HGB 15.0  HCT 43.9  MCV 86.6  PLT 88*   Cardiac Enzymes:  Recent Labs Lab 07/27/16 2022 07/27/16 2343 07/28/16 0507 07/28/16 1029  TROPONINI <0.03 <0.03 <0.03 <0.03   BNP: Invalid input(s): POCBNP CBG: No results for input(s): GLUCAP in the last 168 hours. D-Dimer No results for input(s): DDIMER in the last 72 hours. Hgb A1c No results for input(s): HGBA1C in the last 72 hours. Lipid Profile  Recent Labs  07/28/16 0507  CHOL 111  HDL 11*   LDLCALC 42  TRIG 288*  CHOLHDL 10.1   Thyroid function studies No results for input(s): TSH, T4TOTAL, T3FREE, THYROIDAB in the last 72 hours.  Invalid input(s): FREET3 Anemia work up No results for input(s): VITAMINB12, FOLATE, FERRITIN, TIBC, IRON, RETICCTPCT in the last 72 hours. Microbiology No results found for this or any previous visit (from the past 240 hour(s)).   Discharge Instructions:   Discharge Instructions    Diet - low sodium heart healthy    Complete by:  As directed    Discharge instructions    Complete by:  As directed    Avoid NSAIDS May need outpatient GI follow up once stress test complete   Increase activity slowly    Complete by:  As directed      Allergies as of 07/28/2016      Reactions   Penicillins Rash      Medication List    STOP taking these medications   indomethacin 25 MG capsule Commonly known as:  INDOCIN   nabumetone 750 MG tablet Commonly known as:  RELAFEN   TURMERIC PO     TAKE these medications   albuterol 108 (90 Base) MCG/ACT inhaler Commonly known as:  PROVENTIL HFA;VENTOLIN HFA Inhale 1-2 puffs into the lungs every 6 (six) hours as needed for wheezing or shortness of breath.   allopurinol 100 MG tablet Commonly known as:  ZYLOPRIM Take 100 mg by mouth 2 (two) times daily.   aspirin EC 81 MG tablet Take 1 tablet (81 mg total) by mouth daily.   desloratadine 5 MG tablet Commonly known as:  CLARINEX Take 5 mg by mouth daily.   glucosamine-chondroitin 500-400 MG tablet Take 1 tablet by mouth every evening.   pantoprazole 40 MG tablet Commonly known as:  PROTONIX Take 40 mg by mouth daily.   pravastatin 40 MG tablet Commonly known as:  PRAVACHOL Take 40 mg by mouth daily.      Follow-up Information    [facility] [address] Follow up on 07/29/2016.   Specialty:  Cardiology Why:  9:45 AM for treadmill (ok to switch to lexiscan) myoview stress test. Be at [facility] office at  9:30AM Contact information: [address] [place], PA-C Follow up on 08/05/2016.   Specialties:  Cardiology, Radiology Why:  8:00 AM for hospital follow up and review myoview results Contact information: [place] [address] [phone]        Gennette Pac, MD Follow up in 1 week(s).   Specialty:  Family Medicine Contact information: [place] [address] [phone]            Time coordinating discharge: 35 min  Signed:  Mayur Duman Alison Stalling   [facility] 07/28/2016, 1:14 PM

## 2016-07-28 NOTE — Discharge Instructions (Signed)
° °  You have a Stress Test scheduled at [facility]. Your doctor has ordered this test to check the blood flow in your heart arteries. ° °Please arrive 15 minutes early for paperwork. The whole test will take several hours. You may want to bring reading material to remain occupied while undergoing different parts of the test. ° °Instructions: °· No food/drink after midnight the night before. °· It is OK to take your morning meds with a sip of water EXCEPT for those types of medicines listed below or otherwise instructed. °· No caffeine/decaf products 24 hours before, including medicines such as Excedrin or Goody Powders. Call if there are any questions.  °· Wear comfortable clothes and shoes.  ° °Special Medication Instructions: °· Beta blockers such as metoprolol (Lopressor/Toprol XL), atenolol (Tenormin), carvedilol (Coreg), nebivolol (Bystolic), bisoprolol (Zebeta), propranolol (Inderal) should not be taken for 24 hours before the test. °· Calcium channel blockers such as diltiazem (Cardizem) or verapmil (Calan) should not be taken for 24 hours before the test. °· Remove nitroglycerin patches and do not take nitrate preparations such as Imdur/isosorbide the day of your test. °· No Persantine/Theophylline or Aggrenox medicines should be used within 24 hours of the test. ° °What To Expect: °When you arrive in the lab, the technician will inject a small amount of radioactive tracer into your arm through an IV while you are resting quietly. This helps us to form pictures of your heart. You will likely only feel a sting from the IV. After a waiting period, resting pictures will be obtained under a big camera. These are the "before" pictures. ° °Next, you will be prepped for the stress portion of the test. This may include either walking on a treadmill or receiving a medicine that helps to dilate blood vessels in your heart to simulate the effect of exercise on your heart. If you are walking on  a treadmill, you will walk at different paces to try to get your heart rate to a goal number that is based on your age. If your doctor has chosen the pharmacologic test, then you will receive a medicine through your IV that may cause temporary nausea, flushing, shortness of breath and sometimes chest discomfort or vomiting. This is typically short-lived and usually resolves quickly. If you experience symptoms, that does not automatically mean the test is abnormal. Some patients do not experience any symptoms at all. Your blood pressure and heart rate will be monitored, and we will be watching your EKG on a computer screen for any changes. During this portion of the test, the radiologist will inject another small amount of radioactive tracer into your IV. After a waiting period, you will undergo a second set of pictures. These are the "after" pictures. ° °The doctor reading the test will compare the before-and-after images to look for evidence of heart blockages or heart weakness. The test usually takes 1 day to complete, but in certain instances (for example, if a patient is over a certain weight limit), the test may be done over the span of 2 days. ° ° °

## 2016-07-28 NOTE — Consult Note (Signed)
Cardiology Consult    Patient ID: Joseph Powell MRN: [id number], DOB/AGE: March 21, 1956   Admit date: 07/27/2016 Date of Consult: 07/28/2016  Primary Physician: Gennette Pac, MD Primary Cardiologist: new - Dr. Sallyanne Kuster Requesting Provider: Dr. Eliseo Squires  Reason for Consult: chest, left arm, left neck pain  Patient Profile    Joseph Powell is a 61 yo male with a PMH significant for GERD, HLD, and OSA. [facility] with left arm and neck pain and SOB.  Cardiology was consulted for ACS evaluation.   Past Medical History   Past Medical History:  Diagnosis Date  . Allergic rhinitis   . Bell's palsy   . GERD (gastroesophageal reflux disease)   . Gout   . Hypercholesteremia   . Melanoma ([place])   . Osteoarthritis   . Sleep apnea   . Tubular adenoma     Past Surgical History:  Procedure Laterality Date  . KNEE SURGERY  1993  . MELANOMA EXCISION    . NASAL RECONSTRUCTION    . VASECTOMY  1999     Allergies  Allergies  Allergen Reactions  . Penicillins Rash    History of Present Illness    Joseph Powell reports that he was in his usual state of health until about 1400 yesterday (07/27/16) when he felt a severe dull pain in his left shoulder that radiated down his left arm and into his left neck. He has OA in the left shoulder, but states this pain was different. He was at rest when this started.  Associated symptoms include shortness of breath. Pain was not pleuritic in nature and was not worsened with arm movement.    Troponin x 3 negative. EKG with Q waves in the lateral and inferior leads, nonspecific. We do not have previous EKGs to compare. He has not been given nitro. On my interview, he denies chest pain, per se, but reports left arm pain and left neck pain. He was in pain from 1400 yesterday (07/27/16) until about 1730 when in the ED. He has significant shortness of breath with this episode. He was recently prescribed an inhaler (albuterol) which he used before  coming to the ED without any relief. He also took relafen without any relief, but that produced some stomach upset. He denies diaphoresis, nausea, and vomiting. 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 He denies exercise, but does yard work and chores around the house without chest pain or shortness of breath.   Inpatient Medications    . allopurinol  100 mg Oral BID  . aspirin  324 mg Oral Daily  . loratadine  10 mg Oral Daily  . pantoprazole  40 mg Oral Daily  . pravastatin  40 mg Oral q1800     Outpatient Medications    Prior to Admission medications   Medication Sig Start Date End Date Taking? Authorizing Provider  albuterol (PROVENTIL HFA;VENTOLIN HFA) 108 (90 Base) MCG/ACT inhaler Inhale 1-2 puffs into the lungs every 6 (six) hours as needed for wheezing or shortness of breath.   Yes Historical Provider, MD  allopurinol (ZYLOPRIM) 100 MG tablet Take 100 mg by mouth 2 (two) times daily.   Yes Historical Provider, MD  desloratadine (CLARINEX) 5 MG tablet Take 5 mg by mouth daily.    Yes Historical Provider, MD  glucosamine-chondroitin 500-400 MG tablet Take 1 tablet by mouth every evening.    Yes Historical Provider, MD  indomethacin (INDOCIN) 25 MG capsule Take 25 mg by mouth  2 (two) times daily with a meal.   Yes Historical Provider, MD  nabumetone (RELAFEN) 750 MG tablet Take 750 mg by mouth 2 (two) times daily as needed for mild pain.    Yes Historical Provider, MD  pantoprazole (PROTONIX) 40 MG tablet Take 40 mg by mouth daily.    Yes Historical Provider, MD  pravastatin (PRAVACHOL) 40 MG tablet Take 40 mg by mouth daily.     Yes Historical Provider, MD  TURMERIC PO Take 1 tablet by mouth daily.   Yes Historical Provider, MD     Family History     Family History  Problem Relation Age of Onset  . Diabetes Mother   . Asthma Mother   . Gout Brother     Social History    Social History   Social History  . Marital status: Married    Spouse name: N/A  .  Number of children: N/A  . Years of education: N/A   Occupational History  . Risk analyst    Social History Main Topics  . Smoking status: Never Smoker  . Smokeless tobacco: Never Used  . Alcohol use 1.0 oz/week    2 Standard drinks or equivalent per week  . Drug use: No  . Sexual activity: Not on file   Other Topics Concern  . Not on file   Social History Narrative  . No narrative on file     Review of Systems    General:  No chills, fever, night sweats or weight changes.  Cardiovascular:  No chest pain, dyspnea on exertion, edema, orthopnea, palpitations, paroxysmal nocturnal dyspnea. Dermatological: No rash, lesions/masses Respiratory: No cough, dyspnea Urologic: No hematuria, dysuria Abdominal:   No nausea, vomiting, diarrhea, bright red blood per rectum, melena, or hematemesis Neurologic:  No visual changes, wkns, changes in mental status. All other systems reviewed and are otherwise negative except as noted above.  Physical Exam    Blood pressure (!) 153/90, pulse 71, temperature 97.4 F (36.3 C), temperature source Oral, resp. rate 18, height 5\' 11"  (1.803 m), weight 216 lb 1.6 oz (98 kg), SpO2 98 %.  General: Pleasant, NAD Psych: Normal affect. Neuro: Alert and oriented X 3. Moves all extremities spontaneously. HEENT: Normal  Neck: Supple without bruits or no JVD. Lungs:  Resp regular and unlabored, CTA. Heart: RRR no s3, s4, or murmurs. Abdomen: Soft, non-tender, non-distended, BS + x 4.  Extremities: No clubbing, cyanosis or edema. DP/PT/Radials 2+ and equal bilaterally.  Labs    Troponin [facility] of Care Test)  Recent Labs  07/27/16 1736  TROPIPOC 0.00    Recent Labs  07/27/16 2022 07/27/16 2343 07/28/16 0507  TROPONINI <0.03 <0.03 <0.03   Lab Results  Component Value Date   WBC 6.9 07/27/2016   HGB 15.0 07/27/2016   HCT 43.9 07/27/2016   MCV 86.6 07/27/2016   PLT 88 (L) 07/27/2016    Recent Labs Lab 07/27/16 1717  NA 138  K 3.9    CL 102  CO2 24  BUN 7  CREATININE 0.90  CALCIUM 9.5  GLUCOSE 187*   Lab Results  Component Value Date   CHOL 111 07/28/2016   HDL 11 (L) 07/28/2016   LDLCALC 42 07/28/2016   TRIG 288 (H) 07/28/2016   No results found for: [facility]   Radiology Studies    Dg Chest 2 View  Result Date: 07/27/2016 CLINICAL DATA:  Pt c/o left arm pain, neck pain and shortness of breath onset this afternoon. Pt reports he  just had lunch and when he came home he began to have the symptoms. Pt tried Relafen but then began to have center chest pain. Button artifact over the right upper chest. EXAM: CHEST  2 VIEW COMPARISON:  05/28/2012 FINDINGS: Heart size is normal. The lungs are free of focal consolidations and pleural effusions. Button overlies the right upper chest. IMPRESSION: No active cardiopulmonary disease. Electronically Signed   By: Nolon Nations M.D.   On: 07/27/2016 17:42    ECG & Cardiac Imaging    EKG 07/28/16 NSR, Q waves III and AVF  EKG 07/27/16 NSR, Q waves II, III, AVF, V5/6  Echocardiogram 07/28/16: pending   Assessment & Plan    1. Chest / left arm / left neck pain / shortness of breath - Pt denies actual chest pain, but reports left arm pain and left neck pain that lasted approximately 3.5 hr yesterday and was associated with shortness of breath. Relafen and albuterol did not relieve his pain. After reporting to the ED, his pain was relieved spontaneously. Troponin x 3 negative, EKG without ischemic changes. Echocardiogram pending per primary team. ACS risk factors include HTN and HLD. His father had an MI and CABG in his 36s, no other family history.  - He has been arm and neck pain free without shortness of breath since last evening.  - Given his enzymes and EKG, but 3 risk factors, recommend OP stress test.    2. HTN  - BP running in the 140-150s, not on any home medications - recommend anti-hypertensive to be managed by outpatient PCP   3. HLD - triglycerides 288 -  continue statin, may consider fenofibrate at OP PCP follow up   Signed, Ledora Bottcher, PA-C 07/28/2016, 9:42 AM  I have seen and examined the patient along with Ledora Bottcher, PA.  I have reviewed the chart, notes and new data.  I agree with PA's note.  Key new complaints: symptoms occurred at rest and resolved spontaneously, but lasted for >2-3 hours. Key examination changes: No CHF, no arrhythmia Key new findings / data: markedly low HDL and many features of the metabolic sd. Troponin normal x 2. ECG with nonspecific inferior Q waves, no repolarization changes. Mild chronic thrombocytopenia.  PLAN: Outpatient treadmill Myoview and f/u after that. Advised further attempts at weight loss, moderate ETOH consumption. ASA daily and hold off NSAIDs until we clarify the cause of his symptoms.  Sanda Klein, MD, [place] [phone] 07/28/2016, 10:59 AM

## 2016-07-28 NOTE — Telephone Encounter (Signed)
Left message on voicemail in reference to upcoming appointment scheduled for 07/29/16. Phone number given for a call back so details instructions can be given. Sharyah Bostwick, Ranae Palms

## 2016-07-28 NOTE — Progress Notes (Signed)
Patient arrived to 3E20 from [facility]. Alert and oriented. No complaints of pain. VSS. SR on telemetry. No skin breakdown noted. O2 sats  100% on room air. Admitted for chest pain/observation.

## 2016-07-29 ENCOUNTER — Ambulatory Visit (HOSPITAL_COMMUNITY): Payer: BLUE CROSS/BLUE SHIELD | Attending: Cardiology

## 2016-07-29 DIAGNOSIS — R0789 Other chest pain: Secondary | ICD-10-CM

## 2016-07-29 DIAGNOSIS — R9439 Abnormal result of other cardiovascular function study: Secondary | ICD-10-CM | POA: Insufficient documentation

## 2016-07-29 LAB — MYOCARDIAL PERFUSION IMAGING
CHL CUP MPHR: 160 {beats}/min
CHL CUP NUCLEAR SSS: 9
CSEPEW: 7 METS
Exercise duration (min): 6 min
Exercise duration (sec): 0 s
LHR: 0.32
LV sys vol: 32 mL
LVDIAVOL: 88 mL (ref 62–150)
NUC STRESS TID: 0.84
Peak HR: 162 {beats}/min
Percent HR: 101 %
Rest HR: 88 {beats}/min
SDS: 0
SRS: 9

## 2016-07-29 LAB — HEMOGLOBIN A1C
Hgb A1c MFr Bld: 6.5 % — ABNORMAL HIGH (ref 4.8–5.6)
MEAN PLASMA GLUCOSE: 140 mg/dL

## 2016-07-29 MED ORDER — TECHNETIUM TC 99M TETROFOSMIN IV KIT
32.9000 | PACK | Freq: Once | INTRAVENOUS | Status: AC | PRN
  Administered 2016-07-29: 32.9 via INTRAVENOUS
  Filled 2016-07-29: qty 33

## 2016-07-29 MED ORDER — TECHNETIUM TC 99M TETROFOSMIN IV KIT
10.2000 | PACK | Freq: Once | INTRAVENOUS | Status: AC | PRN
  Administered 2016-07-29: 10.2 via INTRAVENOUS
  Filled 2016-07-29: qty 11

## 2016-07-31 ENCOUNTER — Telehealth: Payer: Self-pay | Admitting: Cardiovascular Disease

## 2016-07-31 NOTE — Telephone Encounter (Signed)
Called patient with results. Patient verbalized understanding. 

## 2016-07-31 NOTE — Telephone Encounter (Signed)
New Message    Pt calling regarding test results states that Darrick Penna called him

## 2016-08-05 ENCOUNTER — Ambulatory Visit (INDEPENDENT_AMBULATORY_CARE_PROVIDER_SITE_OTHER): Payer: BLUE CROSS/BLUE SHIELD | Admitting: Cardiology

## 2016-08-05 ENCOUNTER — Encounter: Payer: Self-pay | Admitting: Cardiology

## 2016-08-05 DIAGNOSIS — M159 Polyosteoarthritis, unspecified: Secondary | ICD-10-CM

## 2016-08-05 DIAGNOSIS — M150 Primary generalized (osteo)arthritis: Secondary | ICD-10-CM | POA: Diagnosis not present

## 2016-08-05 DIAGNOSIS — D696 Thrombocytopenia, unspecified: Secondary | ICD-10-CM

## 2016-08-05 DIAGNOSIS — M8949 Other hypertrophic osteoarthropathy, multiple sites: Secondary | ICD-10-CM

## 2016-08-05 DIAGNOSIS — E785 Hyperlipidemia, unspecified: Secondary | ICD-10-CM

## 2016-08-05 DIAGNOSIS — J4520 Mild intermittent asthma, uncomplicated: Secondary | ICD-10-CM | POA: Diagnosis not present

## 2016-08-05 DIAGNOSIS — R0789 Other chest pain: Secondary | ICD-10-CM | POA: Diagnosis not present

## 2016-08-05 DIAGNOSIS — J45909 Unspecified asthma, uncomplicated: Secondary | ICD-10-CM | POA: Insufficient documentation

## 2016-08-05 DIAGNOSIS — M1990 Unspecified osteoarthritis, unspecified site: Secondary | ICD-10-CM | POA: Insufficient documentation

## 2016-08-05 DIAGNOSIS — G4733 Obstructive sleep apnea (adult) (pediatric): Secondary | ICD-10-CM

## 2016-08-05 NOTE — Patient Instructions (Signed)

## 2016-08-05 NOTE — Assessment & Plan Note (Signed)
Knee and shoulder- takes NSAIDs infrequently

## 2016-08-05 NOTE — Assessment & Plan Note (Signed)
On C-pap 

## 2016-08-05 NOTE — Progress Notes (Signed)
08/05/2016 Joseph Powell   08/03/1955  [id number]  Primary Physician Gennette Pac, MD Primary Cardiologist: Dr Sallyanne Kuster  HPI:  61 y/o male seen in the ED 07/27/16. The pt was sitting on his couch when he noted Lt shoulder "ache"- different from his DJD pain. "I just couldn't get comfortable". He then became SOB and had vague chest discomfort. He called his wife who drove him to the ED. EKG shows small inferior Qs. Troponin negative x 3 and his symptoms where gone by the time he got to the ED. He has no history of exertional symptoms. He had an echo and Myoview as an OP and is here today for follow up/ he has had no further discomfort or SOB.    Current Outpatient Prescriptions  Medication Sig Dispense Refill  . albuterol (PROVENTIL HFA;VENTOLIN HFA) 108 (90 Base) MCG/ACT inhaler Inhale 1-2 puffs into the lungs every 6 (six) hours as needed for wheezing or shortness of breath.    . allopurinol (ZYLOPRIM) 100 MG tablet Take 100 mg by mouth 2 (two) times daily.    Marland Kitchen aspirin EC 81 MG tablet Take 1 tablet (81 mg total) by mouth daily.    Marland Kitchen desloratadine (CLARINEX) 5 MG tablet Take 5 mg by mouth daily.     Marland Kitchen glucosamine-chondroitin 500-400 MG tablet Take 1 tablet by mouth every evening.     . nabumetone (RELAFEN) 750 MG tablet Take 1 tablet by mouth as needed.  0  . pantoprazole (PROTONIX) 40 MG tablet Take 40 mg by mouth daily.     . pravastatin (PRAVACHOL) 40 MG tablet Take 40 mg by mouth daily.      . Turmeric Curcumin 500 MG CAPS Take 1 tablet by mouth daily.     No current facility-administered medications for this visit.     Allergies  Allergen Reactions  . Penicillins Rash    Past Medical History:  Diagnosis Date  . Allergic rhinitis   . Bell's palsy   . GERD (gastroesophageal reflux disease)   . Gout   . Hypercholesteremia   . Melanoma ([place])   . Osteoarthritis   . Sleep apnea   . Tubular adenoma     Social History   Social History  . Marital status: Married     Spouse name: N/A  . Number of children: N/A  . Years of education: N/A   Occupational History  . Risk analyst    Social History Main Topics  . Smoking status: Never Smoker  . Smokeless tobacco: Never Used  . Alcohol use 1.0 oz/week    2 Standard drinks or equivalent per week  . Drug use: No  . Sexual activity: Not on file   Other Topics Concern  . Not on file   Social History Narrative  . No narrative on file     Family History  Problem Relation Age of Onset  . Diabetes Mother   . Asthma Mother   . Gout Brother      Review of Systems: General: negative for chills, fever, night sweats or weight changes.  Cardiovascular: negative for chest pain, dyspnea on exertion, edema, orthopnea, palpitations, paroxysmal nocturnal dyspnea or shortness of breath Dermatological: negative for rash Respiratory: negative for cough or wheezing Urologic: negative for hematuria Abdominal: negative for nausea, vomiting, diarrhea, bright red blood per rectum, melena, or hematemesis Neurologic: negative for visual changes, syncope, or dizziness All other systems reviewed and are otherwise negative except as noted above.    Blood pressure Marland Kitchen)  142/84, pulse 87, height 5\' 11"  (1.803 m), weight 221 lb (100.2 kg).  General appearance: alert, cooperative and no distress Neck: no carotid bruit and no JVD Lungs: clear to auscultation bilaterally Heart: regular rate and rhythm Abdomen: soft, non-tender; bowel sounds normal; no masses,  no organomegaly Extremities: extremities normal, atraumatic, no cyanosis or edema Pulses: 2+ and symmetric Skin: Skin color, texture, turgor normal. No rashes or lesions Neurologic: Grossly normal  EKG NSR, small Q lead 3 and AVF  ASSESSMENT AND PLAN:   Discomfort in chest Pt seen in ED 07/27/16- Troponin negative x 3. EKG showed small inferior Qs, Myoview small basilar defect (? attenuation), echo shows normal LVF  Dyslipidemia HDL was 11- 07/27/16- (?  Lab error)  OSA (obstructive sleep apnea) On C-pap  Mild asthma Inhaler PRN  Thrombocytopenia (HCC) Plts 88k 07/27/16- he has seen Dr Julien Nordmann in the past-felt to have mild thrombocytopenia  DJD (degenerative joint disease) Knee and shoulder- takes NSAIDs infrequently   PLAN  I reviewed Mr Dentinger's test with him. His echo was normal. His Myoview showed an inferior defect and he has small inferior Qs on his EKG. I told him it was "possible" that he had a small blockage but that if it wasn't causing any more symptoms that we would just continue to watch him. He asked about PRN NSAIDs and I told him that would be OK. F/U with Dr Sallyanne Kuster in 6 months. F/U lipids with PCP in the next few months.   Kerin Ransom PA-C 08/05/2016 8:37 AM

## 2016-08-05 NOTE — Assessment & Plan Note (Signed)
Pt seen in ED 07/27/16- Troponin negative x 3. EKG showed small inferior Qs, Myoview small basilar defect (? attenuation), echo shows normal LVF

## 2016-08-05 NOTE — Assessment & Plan Note (Signed)
Inhaler PRN

## 2016-08-05 NOTE — Assessment & Plan Note (Signed)
Plts 88k 07/27/16- he has seen Dr Julien Nordmann in the past-felt to have mild thrombocytopenia

## 2016-08-05 NOTE — Assessment & Plan Note (Signed)
HDL was 11- 07/27/16- (? Lab error)

## 2016-08-06 DIAGNOSIS — I517 Cardiomegaly: Secondary | ICD-10-CM | POA: Diagnosis not present

## 2016-08-06 DIAGNOSIS — R7301 Impaired fasting glucose: Secondary | ICD-10-CM | POA: Diagnosis not present

## 2016-08-06 DIAGNOSIS — R079 Chest pain, unspecified: Secondary | ICD-10-CM | POA: Diagnosis not present

## 2016-08-26 ENCOUNTER — Encounter: Payer: Self-pay | Admitting: Gastroenterology

## 2016-10-07 ENCOUNTER — Ambulatory Visit (INDEPENDENT_AMBULATORY_CARE_PROVIDER_SITE_OTHER): Payer: BLUE CROSS/BLUE SHIELD | Admitting: Gastroenterology

## 2016-10-07 ENCOUNTER — Encounter: Payer: Self-pay | Admitting: Gastroenterology

## 2016-10-07 VITALS — BP 124/66 | HR 83 | Ht 71.0 in | Wt 203.0 lb

## 2016-10-07 DIAGNOSIS — R1310 Dysphagia, unspecified: Secondary | ICD-10-CM

## 2016-10-07 NOTE — Patient Instructions (Signed)
You will be set up for an upper endoscopy for dysphagia, dilation. You should change the way you are taking your antiacid medicine (protonix) so that you are taking it 20-30 minutes prior to a decent meal as that is the way the pill is designed to work most effectively.

## 2016-10-07 NOTE — Progress Notes (Signed)
HPI: This is a  very pleasant 61 year old man  who was referred to me by Hulan Fess, MD  to evaluate  intermittent dysphasia .    Chief complaint is intermittent nonprogressive solid food dysphagia  I last saw him about 7 years ago.  In 2011 he had an overt esophageal food impaction. This was treated endoscopically by pushing the meat bolus into the stomach. Follow-up EGD about 5 or 6 weeks later found a minor Schatzki's ring that was dilated to 20 mm with through-the-scope balloon dilation. At that time I recommended he continue on proton pump inhibitor once daily, chew his food well eating slowly and take small bites.  He clearly noticed a difference around then but over the past couple years the dysphagia has returned. It is intermittent, mild, doesn't seem very progressive. He has however lost about 40 pounds over the past 10 months. He takes proton pump inhibitor still once daily. This is generally after his breakfast meal.  No nausea or vomiting, no overt GI bleeding  He is getting colon cancer screening, polyp surveillance colonoscopies through Dr. Penelope Coop at [facility] gastroenterology. I explained to him that we are same type of Dr. Anola Gurney happy to take over the care for him if he wishes.  Review of systems: Pertinent positive and negative review of systems were noted in the above HPI section. All other review negative.   Past Medical History:  Diagnosis Date  . Allergic rhinitis   . Bell's palsy   . GERD (gastroesophageal reflux disease)   . Gout   . Hypercholesteremia   . Melanoma ([place])   . Osteoarthritis   . Sleep apnea   . Thrombocytopenia ([place])   . Tubular adenoma     Past Surgical History:  Procedure Laterality Date  . KNEE SURGERY  1993  . MELANOMA EXCISION    . NASAL RECONSTRUCTION    . VASECTOMY  1999    Current Outpatient Prescriptions  Medication Sig Dispense Refill  . albuterol (PROVENTIL HFA;VENTOLIN HFA) 108 (90 Base) MCG/ACT inhaler Inhale 1-2 puffs  into the lungs every 6 (six) hours as needed for wheezing or shortness of breath.    . allopurinol (ZYLOPRIM) 100 MG tablet Take 100 mg by mouth 2 (two) times daily.    Marland Kitchen aspirin EC 81 MG tablet Take 1 tablet (81 mg total) by mouth daily.    Marland Kitchen co-enzyme Q-10 30 MG capsule Take 30 mg by mouth daily.    Marland Kitchen desloratadine (CLARINEX) 5 MG tablet Take 5 mg by mouth daily.     Marland Kitchen glucosamine-chondroitin 500-400 MG tablet Take 1 tablet by mouth every evening.     . nabumetone (RELAFEN) 750 MG tablet Take 1 tablet by mouth as needed.  0  . pantoprazole (PROTONIX) 40 MG tablet Take 40 mg by mouth 2 (two) times daily.     . pravastatin (PRAVACHOL) 40 MG tablet Take 40 mg by mouth daily.      . Turmeric Curcumin 500 MG CAPS Take 1 tablet by mouth daily.     No current facility-administered medications for this visit.     Allergies as of 10/07/2016 - Review Complete 10/07/2016  Allergen Reaction Noted  . Penicillins Rash 04/14/2011    Family History  Problem Relation Age of Onset  . Diabetes Mother   . Asthma Mother   . Gout Brother     Social History   Social History  . Marital status: Married    Spouse name: N/A  . Number  of children: N/A  . Years of education: N/A   Occupational History  . Risk analyst    Social History Main Topics  . Smoking status: Never Smoker  . Smokeless tobacco: Never Used  . Alcohol use 1.0 oz/week    2 Standard drinks or equivalent per week  . Drug use: No  . Sexual activity: Not on file   Other Topics Concern  . Not on file   Social History Narrative  . No narrative on file     Physical Exam: BP 124/66 (BP Location: Right Arm, Patient Position: Sitting, Cuff Size: Normal)   Pulse 83   Ht 5\' 11"  (1.803 m)   Wt 203 lb (92.1 kg)   BMI 28.31 kg/m  Constitutional: generally well-appearing Psychiatric: alert and oriented x3 Eyes: extraocular movements intact Mouth: oral pharynx moist, no lesions Neck: supple no lymphadenopathy Cardiovascular:  heart regular rate and rhythm Lungs: clear to auscultation bilaterally Abdomen: soft, nontender, nondistended, no obvious ascites, no peritoneal signs, normal bowel sounds Extremities: no lower extremity edema bilaterally Skin: no lesions on visible extremities   Assessment and plan: 61 y.o. male with  Intermittent solid food nonprogressive dysphasia, positive intentional weight loss  Probably this is the Schatzki's ring causing symptoms again. He has lost 40 pounds but this is been intentional but certainly that does wonder if there is underlying significant neoplasm. We will proceed with upper endoscopy at his soonest convenience. In the meantime he is going to change the way he is taking his antiacid medicine so that is shortly before a meal as that is the way the pill is designed to work most effectively.    Please see the "Patient Instructions" section for addition details about the plan.   Owens Loffler, MD [facility] 10/07/2016, 1:20 PM  Cc: Hulan Fess, MD

## 2016-11-25 ENCOUNTER — Encounter: Payer: Self-pay | Admitting: Gastroenterology

## 2016-12-05 ENCOUNTER — Ambulatory Visit (AMBULATORY_SURGERY_CENTER): Payer: BLUE CROSS/BLUE SHIELD | Admitting: Gastroenterology

## 2016-12-05 ENCOUNTER — Encounter: Payer: Self-pay | Admitting: Gastroenterology

## 2016-12-05 VITALS — BP 106/65 | HR 84 | Temp 97.1°F | Resp 23 | Ht 71.0 in | Wt 203.0 lb

## 2016-12-05 DIAGNOSIS — R1310 Dysphagia, unspecified: Secondary | ICD-10-CM

## 2016-12-05 MED ORDER — SODIUM CHLORIDE 0.9 % IV SOLN: 500.0000 mL | INTRAVENOUS | Status: DC

## 2016-12-05 NOTE — Op Note (Signed)
[facility] Patient Name: Joseph Powell Procedure Date: 12/05/2016 9:27 AM MRN: [id number] Endoscopist: Milus Banister , MD Age: 61 Referring MD:  Date of Birth: 1955/06/10 Gender: Male Account #: [id number] Procedure:                Upper GI endoscopy Indications:              Dysphagia, Heartburn Medicines:                Monitored Anesthesia Care Procedure:                Pre-Anesthesia Assessment:                           - Prior to the procedure, a History and Physical                            was performed, and patient medications and                            allergies were reviewed. The patient's tolerance of                            previous anesthesia was also reviewed. The risks                            and benefits of the procedure and the sedation                            options and risks were discussed with the patient.                            All questions were answered, and informed consent                            was obtained. Prior Anticoagulants: The patient has                            taken no previous anticoagulant or antiplatelet                            agents. ASA Grade Assessment: II - A patient with                            mild systemic disease. After reviewing the risks                            and benefits, the patient was deemed in                            satisfactory condition to undergo the procedure.                           After obtaining informed consent, the endoscope was  passed under direct vision. Throughout the                            procedure, the patient's blood pressure, pulse, and                            oxygen saturations were monitored continuously. The                            Endoscope was introduced through the mouth, and                            advanced to the second part of duodenum. The upper                            GI endoscopy was accomplished  without difficulty.                            The patient tolerated the procedure well. Scope In: Scope Out: Findings:                 The esophagus was normal.                           The stomach was normal.                           The examined duodenum was normal. Complications:            No immediate complications. Estimated blood loss:                            None. Estimated Blood Loss:     Estimated blood loss: none. Impression:               - Normal esophagus.                           - Normal stomach.                           - Normal examined duodenum.                           - No specimens collected. Recommendation:           - Patient has a contact number available for                            emergencies. The signs and symptoms of potential                            delayed complications were discussed with the                            patient. Return to normal activities tomorrow.  Written discharge instructions were provided to the                            patient.                           - Resume previous diet. Chew your food well, eat                            slowly and take small bites.                           - Continue present medications. OK to decrease your                            protonix to once daily (20-30 min prior to                            breakfast or dinner meal). Milus Banister, MD 12/05/2016 9:43:06 AM This report has been signed electronically.

## 2016-12-05 NOTE — Patient Instructions (Signed)
Discharge instructions given. Normal exam. Resume previous medications. YOU HAD AN ENDOSCOPIC PROCEDURE TODAY AT THE [facility]:   Refer to the procedure report that was given to you for any specific questions about what was found during the examination.  If the procedure report does not answer your questions, please call your gastroenterologist to clarify.  If you requested that your care partner not be given the details of your procedure findings, then the procedure report has been included in a sealed envelope for you to review at your convenience later.  YOU SHOULD EXPECT: Some feelings of bloating in the abdomen. Passage of more gas than usual.  Walking can help get rid of the air that was put into your GI tract during the procedure and reduce the bloating. If you had a lower endoscopy (such as a colonoscopy or flexible sigmoidoscopy) you may notice spotting of blood in your stool or on the toilet paper. If you underwent a bowel prep for your procedure, you may not have a normal bowel movement for a few days.  Please Note:  You might notice some irritation and congestion in your nose or some drainage.  This is from the oxygen used during your procedure.  There is no need for concern and it should clear up in a day or so.  SYMPTOMS TO REPORT IMMEDIATELY:   Following upper endoscopy (EGD)  Vomiting of blood or coffee ground material  New chest pain or pain under the shoulder blades  Painful or persistently difficult swallowing  New shortness of breath  Fever of 100F or higher  Black, tarry-looking stools  For urgent or emergent issues, a gastroenterologist can be reached at any hour by calling [phone].   DIET:  We do recommend a small meal at first, but then you may proceed to your regular diet.  Drink plenty of fluids but you should avoid alcoholic beverages for 24 hours.  ACTIVITY:  You should plan to take it easy for the rest of today and you should NOT DRIVE or  use heavy machinery until tomorrow (because of the sedation medicines used during the test).    FOLLOW UP: Our staff will call the number listed on your records the next business day following your procedure to check on you and address any questions or concerns that you may have regarding the information given to you following your procedure. If we do not reach you, we will leave a message.  However, if you are feeling well and you are not experiencing any problems, there is no need to return our call.  We will assume that you have returned to your regular daily activities without incident.  If any biopsies were taken you will be contacted by phone or by letter within the next 1-3 weeks.  Please call us at [phone] if you have not heard about the biopsies in 3 weeks.    SIGNATURES/CONFIDENTIALITY: You and/or your care partner have signed paperwork which will be entered into your electronic medical record.  These signatures attest to the fact that that the information above on your After Visit Summary has been reviewed and is understood.  Full responsibility of the confidentiality of this discharge information lies with you and/or your care-partner. 

## 2016-12-05 NOTE — Progress Notes (Signed)
To recovery, report to McCoy, RN, VSS 

## 2016-12-08 ENCOUNTER — Telehealth: Payer: Self-pay | Admitting: *Deleted

## 2016-12-08 NOTE — Telephone Encounter (Signed)
No answer, no answering machine

## 2016-12-09 ENCOUNTER — Telehealth: Payer: Self-pay

## 2016-12-09 NOTE — Telephone Encounter (Signed)
  Follow up Call-  Call back number 12/05/2016  Post procedure Call Back phone  # [phone]  Permission to leave phone message Yes  Some recent data might be hidden     Left message

## 2016-12-24 ENCOUNTER — Encounter: Payer: Self-pay | Admitting: Gastroenterology

## 2016-12-26 ENCOUNTER — Telehealth: Payer: Self-pay

## 2016-12-26 DIAGNOSIS — R1310 Dysphagia, unspecified: Secondary | ICD-10-CM

## 2016-12-26 NOTE — Telephone Encounter (Signed)
-----   Message from Milus Banister, MD sent at 12/26/2016  8:17 AM EDT ----- He needs hi res esophageal manometry for dysphagia.  Thanks

## 2016-12-26 NOTE — Telephone Encounter (Signed)
Left message on machine to call back  

## 2016-12-29 NOTE — Telephone Encounter (Signed)
I called the pt and advised him of the [facility] and he states he does not wish to have any further treatment at this time.

## 2016-12-29 NOTE — Telephone Encounter (Signed)
You have been scheduled for an esophageal manometry at [facility] on 01/14/17 at 1030 am. Please arrive 30 minutes prior to your procedure for registration. You will need to go to outpatient registration (1st floor of the hospital) first. Make certain to bring your insurance cards as well as a complete list of medications.  Please remember the following:  1) Do not take any muscle relaxants, xanax (alprazolam) or ativan for 1 day prior to your     test as well as the day of the test.  2) Nothing to eat or drink after 12:00 midnight on the night before your test.  3) Hold all diabetic medications/insulin the morning of the test. You may eat and take             your medications after the test.  It will take at least 2 weeks to receive the results of this test from your physician. ------------------------------------------ ABOUT ESOPHAGEAL MANOMETRY Esophageal manometry (muh-NOM-uh-tree) is a test that gauges how well your esophagus works. Your esophagus is the long, muscular tube that connects your throat to your stomach. Esophageal manometry measures the rhythmic muscle contractions (peristalsis) that occur in your esophagus when you swallow. Esophageal manometry also measures the coordination and force exerted by the muscles of your esophagus.  During esophageal manometry, a thin, flexible tube (catheter) that contains sensors is passed through your nose, down your esophagus and into your stomach. Esophageal manometry can be helpful in diagnosing some mostly uncommon disorders that affect your esophagus.  Why it's done Esophageal manometry is used to evaluate the movement (motility) of food through the esophagus and into the stomach. The test measures how well the circular bands of muscle (sphincters) at the top and bottom of your esophagus open and close, as well as the pressure, strength and pattern of the wave of esophageal muscle contractions that moves food along.  What you can  expect Esophageal manometry is an outpatient procedure done without sedation. Most people tolerate it well. You may be asked to change into a hospital gown before the test starts.  During esophageal manometry  While you are sitting up, a member of your health care team sprays your throat with a numbing medication or puts numbing gel in your nose or both.  A catheter is guided through your nose into your esophagus. The catheter may be sheathed in a water-filled sleeve. It doesn't interfere with your breathing. However, your eyes may water, and you may gag. You may have a slight nosebleed from irritation.  After the catheter is in place, you may be asked to lie on your back on an exam table, or you may be asked to remain seated.  You then swallow small sips of water. As you do, a computer connected to the catheter records the pressure, strength and pattern of your esophageal muscle contractions.  During the test, you'll be asked to breathe slowly and smoothly, remain as still as possible, and swallow only when you're asked to do so.  A member of your health care team may move the catheter down into your stomach while the catheter continues its measurements.  The catheter then is slowly withdrawn. The test usually lasts 20 to 30 minutes.  After esophageal manometry  When your esophageal manometry is complete, you may return to your normal activities  This test typically takes 30-45 minutes to complete. ________________________________________________________________________________

## 2017-01-14 ENCOUNTER — Encounter (HOSPITAL_COMMUNITY): Payer: Self-pay

## 2017-01-14 ENCOUNTER — Ambulatory Visit (HOSPITAL_COMMUNITY): Admit: 2017-01-14 | Payer: BLUE CROSS/BLUE SHIELD | Admitting: Gastroenterology

## 2017-01-14 SURGERY — MANOMETRY, ESOPHAGUS
Anesthesia: Choice

## 2017-01-16 DIAGNOSIS — R05 Cough: Secondary | ICD-10-CM | POA: Diagnosis not present

## 2017-01-16 DIAGNOSIS — Z125 Encounter for screening for malignant neoplasm of prostate: Secondary | ICD-10-CM | POA: Diagnosis not present

## 2017-01-16 DIAGNOSIS — R634 Abnormal weight loss: Secondary | ICD-10-CM | POA: Diagnosis not present

## 2017-01-16 DIAGNOSIS — R61 Generalized hyperhidrosis: Secondary | ICD-10-CM | POA: Diagnosis not present

## 2017-01-20 ENCOUNTER — Emergency Department (HOSPITAL_COMMUNITY)
Admission: EM | Admit: 2017-01-20 | Discharge: 2017-01-20 | Discharge status: Against Medical Advice | Payer: BLUE CROSS/BLUE SHIELD | Attending: Emergency Medicine | Admitting: Emergency Medicine

## 2017-01-20 ENCOUNTER — Encounter (HOSPITAL_COMMUNITY): Payer: Self-pay | Admitting: Emergency Medicine

## 2017-01-20 DIAGNOSIS — Z5321 Procedure and treatment not carried out due to patient leaving prior to being seen by health care provider: Secondary | ICD-10-CM | POA: Insufficient documentation

## 2017-01-20 DIAGNOSIS — R109 Unspecified abdominal pain: Secondary | ICD-10-CM | POA: Diagnosis present

## 2017-01-20 LAB — URINALYSIS, ROUTINE W REFLEX MICROSCOPIC
BILIRUBIN URINE: NEGATIVE
Glucose, UA: NEGATIVE mg/dL
Ketones, ur: NEGATIVE mg/dL
Leukocytes, UA: NEGATIVE
Nitrite: NEGATIVE
PROTEIN: 100 mg/dL — AB
SPECIFIC GRAVITY, URINE: 1.023 (ref 1.005–1.030)
pH: 5 (ref 5.0–8.0)

## 2017-01-20 LAB — CBC
HCT: 39.5 % (ref 39.0–52.0)
Hemoglobin: 13.2 g/dL (ref 13.0–17.0)
MCH: 27.8 pg (ref 26.0–34.0)
MCHC: 33.4 g/dL (ref 30.0–36.0)
MCV: 83.2 fL (ref 78.0–100.0)
PLATELETS: 125 10*3/uL — AB (ref 150–400)
RBC: 4.75 MIL/uL (ref 4.22–5.81)
RDW: 16.3 % — ABNORMAL HIGH (ref 11.5–15.5)
WBC: 15.7 10*3/uL — ABNORMAL HIGH (ref 4.0–10.5)

## 2017-01-20 LAB — COMPREHENSIVE METABOLIC PANEL
ALBUMIN: 4 g/dL (ref 3.5–5.0)
ALK PHOS: 138 U/L — AB (ref 38–126)
ALT: 42 U/L (ref 17–63)
AST: 44 U/L — AB (ref 15–41)
Anion gap: 11 (ref 5–15)
BUN: 12 mg/dL (ref 6–20)
CALCIUM: 9.3 mg/dL (ref 8.9–10.3)
CO2: 28 mmol/L (ref 22–32)
CREATININE: 0.76 mg/dL (ref 0.61–1.24)
Chloride: 99 mmol/L — ABNORMAL LOW (ref 101–111)
GFR calc Af Amer: >60 mL/min (ref >60)
GFR calc non Af Amer: >60 mL/min (ref >60)
GLUCOSE: 138 mg/dL — AB (ref 65–99)
Potassium: 3.9 mmol/L (ref 3.5–5.1)
SODIUM: 138 mmol/L (ref 135–145)
Total Bilirubin: 1 mg/dL (ref 0.3–1.2)
Total Protein: 6.4 g/dL — ABNORMAL LOW (ref 6.5–8.1)

## 2017-01-20 LAB — LIPASE, BLOOD: Lipase: 23 U/L (ref 11–51)

## 2017-01-20 NOTE — ED Notes (Signed)
Patient told registration that he feels much better and did not want to wait any longer.

## 2017-01-20 NOTE — ED Triage Notes (Addendum)
Patient believes he is having a gall bladder attack due to his history. Pt c/o abdominal pain onset of last night after having a hamburger. Pt states that is the first time he has eaten fatty food in a long time. States pain comes and goes. Denies N/V/D.  Pt adds he is currently on a zpack and prednisone for bronchitis that began Friday.

## 2017-01-21 ENCOUNTER — Other Ambulatory Visit: Payer: Self-pay | Admitting: Family Medicine

## 2017-01-21 ENCOUNTER — Ambulatory Visit
Admission: RE | Admit: 2017-01-21 | Discharge: 2017-01-21 | Disposition: A | Discharge status: Home / Self Care | Payer: BLUE CROSS/BLUE SHIELD | Source: Ambulatory Visit | Attending: Family Medicine | Admitting: Family Medicine

## 2017-01-21 DIAGNOSIS — R7989 Other specified abnormal findings of blood chemistry: Secondary | ICD-10-CM

## 2017-01-21 DIAGNOSIS — R945 Abnormal results of liver function studies: Secondary | ICD-10-CM

## 2017-01-21 DIAGNOSIS — Z8739 Personal history of other diseases of the musculoskeletal system and connective tissue: Secondary | ICD-10-CM | POA: Diagnosis not present

## 2017-01-21 DIAGNOSIS — R7301 Impaired fasting glucose: Secondary | ICD-10-CM | POA: Diagnosis not present

## 2017-01-21 DIAGNOSIS — E782 Mixed hyperlipidemia: Secondary | ICD-10-CM | POA: Diagnosis not present

## 2017-01-21 DIAGNOSIS — Z0000 Encounter for general adult medical examination without abnormal findings: Secondary | ICD-10-CM | POA: Diagnosis not present

## 2017-01-21 DIAGNOSIS — R748 Abnormal levels of other serum enzymes: Secondary | ICD-10-CM | POA: Diagnosis not present

## 2017-01-21 DIAGNOSIS — R161 Splenomegaly, not elsewhere classified: Secondary | ICD-10-CM | POA: Diagnosis not present

## 2017-01-21 MED ORDER — IOPAMIDOL (ISOVUE-300) INJECTION 61%
100.0000 mL | Freq: Once | INTRAVENOUS | Status: AC | PRN
  Administered 2017-01-21: 100 mL via INTRAVENOUS

## 2017-01-22 ENCOUNTER — Other Ambulatory Visit: Payer: Self-pay | Admitting: Family Medicine

## 2017-01-22 DIAGNOSIS — R748 Abnormal levels of other serum enzymes: Secondary | ICD-10-CM

## 2017-01-23 NOTE — Progress Notes (Signed)
HEMATOLOGY/ONCOLOGY CONSULTATION NOTE  Date of Service: 01/26/2017  Patient Care Team: Hulan Fess, MD as PCP - General (Family Medicine)  CHIEF COMPLAINTS/PURPOSE OF CONSULTATION:  Suspicion for Lymphoma vs malignancy or unknown primary  HISTORY OF PRESENTING ILLNESS:   Joseph Powell is a wonderful 61 y.o. male who has been referred to [place] by Dr. Hulan Fess, PCP, for evaluation of symptoms and CT Abd pelvis concerning for lymphoma.   He presents to the clinic today with his wife.  He notes a cough that had been coming and going for 3 years now. It worsens with allergies and his asthma. Originally it was contributed to acid reflux but has not had experienced heartburn. This last episode of coughing prompted him to get an endoscopy 1-2 months ago which he reports showed no acid reflux or overt esophagitis.   In April he developed asthmatic bronchitis and was given 10mg  prednisone tablets taken 4 for 4 days and tapered down for 12 days to help resolve it.   Recently he has developed severe drenching night sweats which he was experiencing intermittently for 1-2 months. He reports to purposeful 50 pounds weight loss over the last 10 months orf which he rapidly and unexpectedly lost 20 pounds over the last month.   He notes that he has previously had abdominal pain thought to be the gall bladder and has significant abdominal pain once a few weeks ago for which he went to the emergency room but then decided to leave due to significant waiting times in the emergency room. He subsequently followed up with his primary care physician and had an outpatient CT abdomen and pelvis on 01/21/2017 which showed Extensive retroperitoneal, mesenteric and left pelvic lymphadenopathy. Splenomegaly with numerous low-attenuation splenic masses. Several small low-attenuation liver masses. The leading consideration is lymphoma, although metastatic disease is on the differential.  Patient reports that he  developed a knot in his left lower neck which is clinically obviously palpable about 3-4 days ago. No overt fevers or chills No sick contacts.  Recent EGD showed no significant abnormalities. Previous colonoscopy was about 2 years ago and showed no evidence of malignancy.  He denies leg swelling or testicular discomfort.  His gout is stable and his sleep apnea is better since he lost weight. He denies sore throat, headaches or change in vision. He has not had blood transfusion or tattoos.  Patient reports no other specific focal symptoms.  He has had a bothersome cough and wonders if he has any disease in his chest. He has an appointment to see a pulmonologist tomorrow.  His Melanoma was below his rt shoulder blade and was removed in 2009. He follows up yearly with Dermatologist Dr. Allyson Sabal.   He previously saw [facility]. Inda Merlin at [facility] for Low platelet in 2012 (down to 85K) that was likely attributed to his medication Relafen. His counts are back toward 125K on repeat labs about a week ago.  He does not note any family history of blood disorder or cancer. He has no history of smoking.  He previously worked in [employer] for 30 years and was indirectly around chemicals/VOC's.   MEDICAL HISTORY:  Past Medical History:  Diagnosis Date  . Allergic rhinitis   . Asthma    slight  . Bell's palsy   . GERD (gastroesophageal reflux disease)   . Gout   . Hypercholesteremia   . Melanoma ([place])   . Osteoarthritis   . Sleep apnea   . Thrombocytopenia ([place])   .  Tubular adenoma     SURGICAL HISTORY: Past Surgical History:  Procedure Laterality Date  . KNEE SURGERY  1993  . MELANOMA EXCISION    . NASAL RECONSTRUCTION    . VASECTOMY  1999    SOCIAL HISTORY: Social History   Social History  . Marital status: Married    Spouse name: N/A  . Number of children: N/A  . Years of education: N/A   Occupational History  . Risk analyst    Social History Main Topics  . Smoking status:  Never Smoker  . Smokeless tobacco: Never Used  . Alcohol use 1.0 oz/week    2 Standard drinks or equivalent per week  . Drug use: No  . Sexual activity: Not on file   Other Topics Concern  . Not on file   Social History Narrative  . No narrative on file    FAMILY HISTORY: Family History  Problem Relation Age of Onset  . Diabetes Mother   . Asthma Mother   . Gout Brother   . Colon cancer Paternal Uncle   . Esophageal cancer Neg Hx   . Stomach cancer Neg Hx     ALLERGIES:  is allergic to penicillins.  MEDICATIONS:  Current Outpatient Prescriptions  Medication Sig Dispense Refill  . albuterol (PROVENTIL HFA;VENTOLIN HFA) 108 (90 Base) MCG/ACT inhaler Inhale 1-2 puffs into the lungs every 6 (six) hours as needed for wheezing or shortness of breath.    . allopurinol (ZYLOPRIM) 100 MG tablet Take 100 mg by mouth 2 (two) times daily.    Marland Kitchen aspirin EC 81 MG tablet Take 1 tablet (81 mg total) by mouth daily. (Patient not taking: Reported on 01/27/2017)    . benzonatate (TESSALON) 200 MG capsule Take 1 capsule (200 mg total) by mouth 3 (three) times daily as needed for cough. 45 capsule 1  . co-enzyme Q-10 30 MG capsule Take 30 mg by mouth daily.    Marland Kitchen glucosamine-chondroitin 500-400 MG tablet Take 1 tablet by mouth 2 (two) times daily.     . mometasone-formoterol (DULERA) 100-5 MCG/ACT AERO Inhale 2 puffs into the lungs 2 (two) times daily. 1 Inhaler 11  . nabumetone (RELAFEN) 750 MG tablet Take 1 tablet by mouth as needed.  0  . pantoprazole (PROTONIX) 40 MG tablet Take 40 mg by mouth daily.     . pravastatin (PRAVACHOL) 40 MG tablet Take 40 mg by mouth daily.      . Turmeric Curcumin 500 MG CAPS Take 1 tablet by mouth daily.     Current Facility-Administered Medications  Medication Dose Route Frequency Provider Last Rate Last Dose  . 0.9 %  sodium chloride infusion  500 mL Intravenous Continuous Milus Banister, MD        REVIEW OF SYSTEMS:    10 Point review of Systems was  done is negative except as noted above.  PHYSICAL EXAMINATION: ECOG PERFORMANCE STATUS: 1 - Symptomatic but completely ambulatory  . Vitals:   01/26/17 1100  BP: (!) 155/76  Pulse: 96  Resp: 18  Temp: 98.2 F (36.8 C)  SpO2: 99%   Filed Weights   01/26/17 1100  Weight: 183 lb 11.2 oz (83.3 kg)   .Body mass index is 25.62 kg/m.  GENERAL:alert, in no acute distress and comfortable SKIN: no acute rashes, no significant lesions EYES: conjunctiva are pink and non-injected, sclera anicteric OROPHARYNX: MMM, no exudates, no oropharyngeal erythema or ulceration NECK: supple, no JVD LYMPH:  (+) 2 palpable lymphadenopathy in the cervical  left lower neck and no obvious lymphadenopathy in axillary or inguinal regions that I could appreciate LUNGS: clear to auscultation b/l with normal respiratory effort HEART: regular rate & rhythm ABDOMEN:  normoactive bowel sounds , non tender, not distended. Borderline palpable splenomegaly, no overt hepatomegaly.  Extremity: no pedal edema PSYCH: alert & oriented x 3 with fluent speech NEURO: no focal motor/sensory deficits  LABORATORY DATA:  I have reviewed the data as listed  . CBC Latest Ref Rng & Units 01/26/2017 01/20/2017 07/27/2016  WBC 4.0 - 10.3 10e3/uL 9.2 15.7(H) 6.9  Hemoglobin 13.0 - 17.1 g/dL 12.6(L) 13.2 15.0  Hematocrit 38.4 - 49.9 % 37.9(L) 39.5 43.9  Platelets 140 - 400 10e3/uL 56(L) 125(L) 88(L)    . CMP Latest Ref Rng & Units 01/26/2017 01/20/2017 07/27/2016  Glucose 70 - 140 mg/dl 200(H) 138(H) 187(H)  BUN 7.0 - 26.0 mg/dL 18.1 12 7   Creatinine 0.7 - 1.3 mg/dL 0.8 0.76 0.90  Sodium 136 - 145 mEq/L 136 138 138  Potassium 3.5 - 5.1 mEq/L 4.4 3.9 3.9  Chloride 101 - 111 mmol/L - 99(L) 102  CO2 22 - 29 mEq/L 29 28 24   Calcium 8.4 - 10.4 mg/dL 9.7 9.3 9.5  Total Protein 6.4 - 8.3 g/dL 6.6 6.4(L) -  Total Bilirubin 0.20 - 1.20 mg/dL 1.02 1.0 -  Alkaline Phos 40 - 150 U/L 218(H) 138(H) -  AST 5 - 34 U/L 64(H) 44(H) -  ALT 0  - 55 U/L 69(H) 42 -   Component     Latest Ref Rng & Units 01/26/2017  HIV Screen 4th Generation wRfx     Non Reactive Non Reactive  Hep C Virus Ab     0.0 - 0.9 s/co ratio <0.1  LDH     125 - 245 U/L 522 (H)  Sed Rate     0 - 30 mm/hr 6  Hep B Core Ab, Tot     Negative Negative  Hepatitis B Surface Ag     Negative Negative    RADIOGRAPHIC STUDIES: I have personally reviewed the radiological images as listed and agreed with the findings in the report. Ct Abdomen Pelvis W Contrast  Result Date: 01/21/2017 CLINICAL DATA:  Elevated liver function tests. History of back melanoma. EXAM: CT ABDOMEN AND PELVIS WITH CONTRAST TECHNIQUE: Multidetector CT imaging of the abdomen and pelvis was performed using the standard protocol following bolus administration of intravenous contrast. CONTRAST:  169mL ISOVUE-300 IOPAMIDOL (ISOVUE-300) INJECTION 61% COMPARISON:  10/09/2015 abdominal sonogram. FINDINGS: Lower chest: Mild patchy tree-in-bud opacities in the dependent lower lobes bilaterally. Coronary atherosclerosis. Hepatobiliary: Normal liver size. At least 4 scattered hypodense liver lesions, largest 1.6 cm in the segment 7 right liver lobe (series 2/ image 16) and 1.5 cm in the segment 2 left liver lobe (series 2/image 17). Normal gallbladder with no radiopaque cholelithiasis. No biliary ductal dilatation. Pancreas: Normal, with no mass or duct dilation. Spleen: Mildly enlarged spleen (craniocaudal splenic length 13.0 cm). Multiple (at least 10) hypodense masses scattered throughout the spleen, largest 2.8 cm (series 2/ image 29). Adrenals/Urinary Tract: Normal adrenals. No hydronephrosis. No renal masses. Normal bladder. Stomach/Bowel: Grossly normal stomach. Normal caliber small bowel with no small bowel wall thickening. Normal appendix. Normal large bowel with no diverticulosis, large bowel wall thickening or pericolonic fat stranding. Vascular/Lymphatic: Atherosclerotic nonaneurysmal abdominal aorta.  Patent portal, splenic, hepatic and renal veins. Gastrohepatic ligament lymphadenopathy measuring up to 1.6 cm (series 2/ image 20). Enlarged 2.3 cm portacaval node (series 2/ image  30). Multiple enlarged mesenteric lymph nodes, largest 3.4 cm in the right mesentery (series 2/ image 49). Right retrocrural adenopathy measuring up to the 2.1 cm (series 2/ image 29). Multiple enlarged left para-aortic nodes, largest 2.1 cm (series 2/ image 39). Left external iliac lymphadenopathy measuring up to 1.2 cm (series 2/ image 73). Reproductive: Top-normal size prostate with nonspecific internal prostatic calcifications. Other: No pneumoperitoneum. No focal fluid collection. Small volume free fluid in the deep pelvis. Musculoskeletal: No aggressive appearing focal osseous lesions. Mild thoracolumbar spondylosis. IMPRESSION: 1. Extensive retroperitoneal, mesenteric and left pelvic lymphadenopathy. Splenomegaly with numerous low-attenuation splenic masses. Several small low-attenuation liver masses. The leading consideration is lymphoma, although metastatic disease is on the differential given the history of melanoma. 2. Mild patchy tree-in-bud opacities in the dependent lung bases bilaterally, most compatible with a nonspecific mild infectious or inflammatory bronchiolitis, with the differential including aspiration. 3.  Aortic Atherosclerosis (ICD10-I70.0).  Coronary atherosclerosis. Electronically Signed   By: Ilona Sorrel M.D.   On: 01/21/2017 11:42    ASSESSMENT & PLAN:    UVALDO RYBACKI is a 61 y.o. male with   #1 Generalized lymphadenopathy with splenomegaly and elevated LDH -- high likelihood of intermediate to High grade Lymphoma.  CT abd/pelvis - 01/21/2017- Extensive retroperitoneal, mesenteric and left pelvic lymphadenopathy. Splenomegaly with numerous low-attenuation splenic masses. Several small low-attenuation liver masses. The leading consideration is lymphoma, although metastatic disease with  unknown primary in on the differential.  #2 Clinical evidence of left lower anterior cervical Lymphadenopathy  #3 Acute on chronic Thrombocytopenia. Patient has has some chronic thrombocytopenia 85-125k for a few years. Platelets today are down to 56k in the setting of newly diagnosed lymphoma/malignancy of unknown primary. Could be from hypersplenism.  #4 abnormal liver function tests likely related to liver involvement by tumor.  Plan -We'll need to get urgent PET/CT scan to evaluate the patient for potential primary lesion and also to guide diagnostic workup and treatment strategy for likely lymphoma. -Additional labs were done today and the patient was noted to be HIV-negative and negative for hepatitis B and C. Significantly elevated LDH level suggestive of intermediate to high-grade lymphoma. -Urgent referral to Dr. Melida Quitter ENT for consideration of excisional left cervical lymph node biopsy for definitive diagnosis. Patient specifically requested for Dr. Redmond Baseman since they know him and that he is from their church. - PET CT scan might help shed more light on the extent of disease to determine if there are any other accessible and appropriate biopsy sites and to determine if his left cervical lymphadenopathy is consistent with the rest of his disease state. -The labs were reviewed. -No indication for platelet infusion at this time and his platelet counts above 50k should be adequate for his lymph node biopsy. -He was recommended to avoid NSAIDs. -Avoid hepatotoxic medications. -We'll try to get outside records regarding his melanoma biopsy from Dr. Allyson Sabal.  #2 History of Melanoma, 2009 -Followed by Dr. Allyson Sabal for continued skin screening   #3 Patient Active Problem List   Diagnosis Date Noted  . Dyslipidemia 08/05/2016  . Mild asthma 08/05/2016  . DJD (degenerative joint disease) 08/05/2016  . HLD (hyperlipidemia) 07/27/2016  . GERD (gastroesophageal reflux disease) 07/27/2016   . Gout 07/27/2016  . Discomfort in chest 07/27/2016  . OSA (obstructive sleep apnea) 09/19/2013  . Thrombocytopenia ([place]) 04/14/2011  . SCHATZKI'S RING 08/21/2009  -Elevated Liver Enzymes  PLAN:  -I advised him to see pulmonologist and to avoid high does steroids at this time -  I advice he can use tylenol for his aches instead of aleve due to his thrombocytopenia.    Labs today PET/CT in 5 days ENT referral to Dr Melida Quitter urgently in 5-7 days for left lower neck LN excisional biopsy (concern for lymphoma) RTC with [name] in 2 weeks with labs    All of the patients questions were answered with apparent satisfaction. The patient knows to call the clinic with any problems, questions or concerns.  I spent 45 minutes counseling the patient face to face. The total time spent in the appointment was 60 minutes and more than 50% was on counseling and direct patient cares.   This document serves as a record of services personally performed by Sullivan Lone, MD. It was created on her behalf by Joslyn Devon, a trained medical scribe. The creation of this record is based on the scribe's personal observations and the provider's statements to them. This document has been checked and approved by the attending provider.   Sullivan Lone MD [place] AAHIVMS [facility] Hematology/Oncology Physician [facility]  (Office):       [phone] (Work cell):  [phone] (Fax):           [phone]  01/26/2017 11:35 AM

## 2017-01-26 ENCOUNTER — Ambulatory Visit (HOSPITAL_BASED_OUTPATIENT_CLINIC_OR_DEPARTMENT_OTHER): Payer: BLUE CROSS/BLUE SHIELD | Admitting: Hematology

## 2017-01-26 ENCOUNTER — Ambulatory Visit (HOSPITAL_BASED_OUTPATIENT_CLINIC_OR_DEPARTMENT_OTHER): Payer: BLUE CROSS/BLUE SHIELD

## 2017-01-26 ENCOUNTER — Telehealth: Payer: Self-pay | Admitting: Hematology

## 2017-01-26 ENCOUNTER — Encounter: Payer: Self-pay | Admitting: Hematology

## 2017-01-26 VITALS — BP 155/76 | HR 96 | Temp 98.2°F | Resp 18 | Ht 71.0 in | Wt 183.7 lb

## 2017-01-26 DIAGNOSIS — R634 Abnormal weight loss: Secondary | ICD-10-CM

## 2017-01-26 DIAGNOSIS — R591 Generalized enlarged lymph nodes: Secondary | ICD-10-CM | POA: Diagnosis not present

## 2017-01-26 DIAGNOSIS — R05 Cough: Secondary | ICD-10-CM | POA: Diagnosis not present

## 2017-01-26 DIAGNOSIS — R160 Hepatomegaly, not elsewhere classified: Secondary | ICD-10-CM | POA: Diagnosis not present

## 2017-01-26 DIAGNOSIS — R61 Generalized hyperhidrosis: Secondary | ICD-10-CM | POA: Diagnosis not present

## 2017-01-26 DIAGNOSIS — R161 Splenomegaly, not elsewhere classified: Secondary | ICD-10-CM

## 2017-01-26 DIAGNOSIS — Z85820 Personal history of malignant melanoma of skin: Secondary | ICD-10-CM | POA: Diagnosis not present

## 2017-01-26 DIAGNOSIS — D696 Thrombocytopenia, unspecified: Secondary | ICD-10-CM | POA: Diagnosis not present

## 2017-01-26 DIAGNOSIS — R740 Nonspecific elevation of levels of transaminase and lactic acid dehydrogenase [LDH]: Secondary | ICD-10-CM

## 2017-01-26 DIAGNOSIS — R590 Localized enlarged lymph nodes: Secondary | ICD-10-CM

## 2017-01-26 LAB — COMPREHENSIVE METABOLIC PANEL
ALBUMIN: 3.5 g/dL (ref 3.5–5.0)
ALK PHOS: 218 U/L — AB (ref 40–150)
ALT: 69 U/L — ABNORMAL HIGH (ref 0–55)
AST: 64 U/L — AB (ref 5–34)
Anion Gap: 8 mEq/L (ref 3–11)
BILIRUBIN TOTAL: 1.02 mg/dL (ref 0.20–1.20)
BUN: 18.1 mg/dL (ref 7.0–26.0)
CO2: 29 mEq/L (ref 22–29)
Calcium: 9.7 mg/dL (ref 8.4–10.4)
Chloride: 99 mEq/L (ref 98–109)
Creatinine: 0.8 mg/dL (ref 0.7–1.3)
EGFR: >90 mL/min/{1.73_m2} (ref >90)
GLUCOSE: 200 mg/dL — AB (ref 70–140)
Potassium: 4.4 mEq/L (ref 3.5–5.1)
SODIUM: 136 meq/L (ref 136–145)
TOTAL PROTEIN: 6.6 g/dL (ref 6.4–8.3)

## 2017-01-26 LAB — CBC & DIFF AND RETIC
BASO%: 0.7 % (ref 0.0–2.0)
BASOS ABS: 0.1 10*3/uL (ref 0.0–0.1)
EOS ABS: 0 10*3/uL (ref 0.0–0.5)
EOS%: 0 % (ref 0.0–7.0)
HCT: 37.9 % — ABNORMAL LOW (ref 38.4–49.9)
HEMOGLOBIN: 12.6 g/dL — AB (ref 13.0–17.1)
IMMATURE RETIC FRACT: 7.3 % (ref 3.00–10.60)
LYMPH#: 2.1 10*3/uL (ref 0.9–3.3)
LYMPH%: 22.6 % (ref 14.0–49.0)
MCH: 28.2 pg (ref 27.2–33.4)
MCHC: 33.2 g/dL (ref 32.0–36.0)
MCV: 84.8 fL (ref 79.3–98.0)
MONO#: 0.7 10*3/uL (ref 0.1–0.9)
MONO%: 7.5 % (ref 0.0–14.0)
NEUT#: 6.4 10*3/uL (ref 1.5–6.5)
NEUT%: 69.2 % (ref 39.0–75.0)
NRBC: 0 % (ref 0–0)
Platelets: 56 10*3/uL — ABNORMAL LOW (ref 140–400)
RBC: 4.47 10*6/uL (ref 4.20–5.82)
RDW: 17.3 % — AB (ref 11.0–14.6)
RETIC %: 2.66 % — AB (ref 0.80–1.80)
Retic Ct Abs: 118.9 10*3/uL — ABNORMAL HIGH (ref 34.80–93.90)
WBC: 9.2 10*3/uL (ref 4.0–10.3)

## 2017-01-26 LAB — LACTATE DEHYDROGENASE: LDH: 522 U/L — ABNORMAL HIGH (ref 125–245)

## 2017-01-26 LAB — TECHNOLOGIST REVIEW

## 2017-01-26 NOTE — Telephone Encounter (Signed)
Gave patient AVS and calendar of upcoming September appointments.  °

## 2017-01-27 ENCOUNTER — Ambulatory Visit (INDEPENDENT_AMBULATORY_CARE_PROVIDER_SITE_OTHER): Payer: BLUE CROSS/BLUE SHIELD | Admitting: Internal Medicine

## 2017-01-27 ENCOUNTER — Telehealth: Payer: Self-pay

## 2017-01-27 ENCOUNTER — Encounter: Payer: Self-pay | Admitting: Internal Medicine

## 2017-01-27 VITALS — BP 122/68 | HR 94 | Ht 71.0 in | Wt 184.0 lb

## 2017-01-27 DIAGNOSIS — J45991 Cough variant asthma: Secondary | ICD-10-CM

## 2017-01-27 LAB — HIV ANTIBODY (ROUTINE TESTING W REFLEX): HIV Screen 4th Generation wRfx: NONREACTIVE

## 2017-01-27 LAB — HEPATITIS B CORE ANTIBODY, TOTAL: Hep B Core Ab, Tot: NEGATIVE

## 2017-01-27 LAB — HEPATITIS C ANTIBODY: Hep C Virus Ab: <0.1 s/co ratio (ref 0.0–0.9)

## 2017-01-27 LAB — HEPATITIS B SURFACE ANTIGEN: HBsAg Screen: NEGATIVE

## 2017-01-27 LAB — SEDIMENTATION RATE: Sedimentation Rate-Westergren: 6 mm/hr (ref 0–30)

## 2017-01-27 MED ORDER — BENZONATATE 200 MG PO CAPS: 200.0000 mg | ORAL_CAPSULE | Freq: Three times a day (TID) | ORAL | 1 refills | Status: DC | PRN

## 2017-01-27 MED ORDER — MOMETASONE FURO-FORMOTEROL FUM 100-5 MCG/ACT IN AERO: 2.0000 | INHALATION_SPRAY | Freq: Two times a day (BID) | RESPIRATORY_TRACT | 11 refills | Status: DC

## 2017-01-27 MED ORDER — MOMETASONE FURO-FORMOTEROL FUM 100-5 MCG/ACT IN AERO: 2.0000 | INHALATION_SPRAY | Freq: Two times a day (BID) | RESPIRATORY_TRACT | 0 refills | Status: DC

## 2017-01-27 NOTE — Progress Notes (Signed)
Subjective:     Patient ID: Joseph Powell, male   DOB: 1955/09/14,    MRN: [id number]  HPI  15 yowm never smoker never problems with allergies/ asthma onset of pnds seasonally in 20s better on clarinex but eventually started needed year round but even then  gradually worse sense of pnds then daily cough x 2015 referred to pulmonary clinic 01/27/2017 by Dr   Hulan Fess with recently detected widespread adenopathy c/w lymphoma/ w/u in progress   01/27/2017 1st [facility] office visit/ Joseph Powell   Chief Complaint  Patient presents with  . Pulmonary Consult    Referred by Dr. Hulan Fess. Pt c/o cough off and on for the past 3 years. He is not producing any sputum at this point- just finished pred taper and round of abx. He states that when he tries to take in a deep breath it triggers him to cough.  He has an albuterol inhaler that he typically uses 3 x per month.   chronic pnds x decades then worse cough esp spring and fall much better with prednisone which tapered off one day prior to OV  But s excess/ purulent sputum or mucus plugs   ppi seemed to help some with neg EGD 12/05/16 and continuing to take ppi but not always 30 m ac  Not limited by breathing from desired activities but senses can't take a deep breath s provoking cough which is worse with ex   ? Some better with saba but rarely uses   No obvious day to day or daytime variability or assoc   hemoptysis or cp or chest tightness, subjective wheeze or overt sinus or hb symptoms. No unusual exp hx or h/o childhood pna/ asthma or knowledge of premature birth.  Sleeping ok flat without nocturnal  or early am exacerbation  of respiratory  c/o's or need for noct saba. Also denies any obvious fluctuation of symptoms with weather or environmental changes or other aggravating or alleviating factors except as outlined above   Current Allergies, Complete Past Medical History, Past Surgical History, Family History, and Social History were  reviewed in [employer] record.  ROS  The following are not active complaints unless bolded sore throat, dysphagia, dental problems, itching, sneezing,  nasal congestion or disharge of excess mucus or purulent secretions, ear ache,   fever, chills, sweats, unintended wt loss or wt gain, classically pleuritic or exertional cp,  orthopnea pnd or leg swelling, presyncope, palpitations, abdominal pain, anorexia, nausea, vomiting, diarrhea  or change in bowel habits or bladder habits, change in stools or change in urine, dysuria, hematuria,  rash, arthralgias, visual complaints, headache, numbness, weakness or ataxia or problems with walking or coordination,  change in mood/affect or memory.        Current Meds  Medication Sig  . albuterol (PROVENTIL HFA;VENTOLIN HFA) 108 (90 Base) MCG/ACT inhaler Inhale 1-2 puffs into the lungs every 6 (six) hours as needed for wheezing or shortness of breath.  . allopurinol (ZYLOPRIM) 100 MG tablet Take 100 mg by mouth 2 (two) times daily.  Marland Kitchen co-enzyme Q-10 30 MG capsule Take 30 mg by mouth daily.  Marland Kitchen glucosamine-chondroitin 500-400 MG tablet Take 1 tablet by mouth 2 (two) times daily.   . nabumetone (RELAFEN) 750 MG tablet Take 1 tablet by mouth as needed.  . pantoprazole (PROTONIX) 40 MG tablet Take 40 mg by mouth daily.   . pravastatin (PRAVACHOL) 40 MG tablet Take 40 mg by mouth daily.    Marland Kitchen  Turmeric Curcumin 500 MG CAPS Take 1 tablet by mouth daily.  Marland Kitchen   desloratadine (CLARINEX) 5 MG tablet Take 5 mg by mouth daily.       Review of Systems     Objective:   Physical Exam    amb wm freq throat clearing  Wt Readings from Last 3 Encounters:  01/27/17 184 lb (83.5 kg)  01/26/17 183 lb 11.2 oz (83.3 kg)  01/20/17 182 lb (82.6 kg)    Vital signs reviewed  - Note on arrival 02 sats  95% on RA     HEENT: nl dentition, turbinates bilaterally, and oropharynx. Nl external ear canals without cough reflex Large marble sized very hard min  mobile node L supraclavicular area not tender   NECK :  without JVD/Nodes/TM/ nl carotid upstrokes bilaterally   LUNGS: no acc muscle use,  Nl contour chest which is clear to A and P bilaterally with cough early  on insp  maneuvers   CV:  RRR  no s3 or murmur or increase in P2, and no edema   ABD:  soft and nontender with nl inspiratory excursion in the supine position. No bruits or organomegaly appreciated, bowel sounds nl  MS:  Nl gait/ ext warm without deformities, calf tenderness, cyanosis or clubbing No obvious joint restrictions   SKIN: warm and dry without lesions    NEURO:  alert, approp, nl sensorium with  no motor or cerebellar deficits apparent.      I personally reviewed images and agree with radiology impression as follows:   Chest CT portion of CT abd  01/21/17  Lower chest: Mild patchy tree-in-bud opacities in the dependent lower lobes bilaterally   Assessment:

## 2017-01-27 NOTE — Patient Instructions (Addendum)
Continue protonix 40 mg Take 30-60 min before first meal of the day   GERD (REFLUX)  is an extremely common cause of respiratory symptoms just like yours , many times with no obvious heartburn at all.    It can be treated with medication, but also with lifestyle changes including elevation of the head of your bed (ideally with 6 inch  bed blocks),  Smoking cessation, avoidance of late meals, excessive alcohol, and avoid fatty foods, chocolate, peppermint, colas, red wine, and acidic juices such as orange juice.  NO MINT OR MENTHOL PRODUCTS SO NO COUGH DROPS   USE SUGARLESS CANDY INSTEAD (Jolley ranchers or Stover's or Life Savers) or even ice chips will also do - the key is to swallow to prevent all throat clearing. NO OIL BASED VITAMINS - use powdered substitutes.  Dulera 100 Take 2 puffs first thing in am and then another 2 puffs about 12 hours later.   Work on inhaler technique:  relax and gently blow all the way out then take a nice smooth deep breath back in, triggering the inhaler at same time you start breathing in.  Hold for up to 5 seconds if you can. Blow out thru nose. Rinse and gargle with water when done  If still can't stop coughing > tessalon 200 mg  Up to three times daily as needed   For drainage / throat tickle try instead of clarinex take CHLORPHENIRAMINE  4 mg - take one every 4 hours as needed - available over the counter- may cause drowsiness so start with just a bedtime dose or two and see how you tolerate it before trying in daytime  Please schedule a follow up office visit in 6 weeks, call sooner if needed

## 2017-01-27 NOTE — Telephone Encounter (Signed)
Pt referred to Dr. Melida Quitter, ENT for excisional biopsy to cervical lymphnode  (left lower neck). Demographics, insurance information, and last OV note provided. Confirmed fax receipt 01/27/17 at 1409.

## 2017-01-28 NOTE — Assessment & Plan Note (Signed)
CT abd 01/21/17 with nonspecific and very mild tree in bud pattern 01/27/2017  After extensive coaching HFA effectiveness =    90% > try duler 100 2bid   The most common causes of chronic cough in immunocompetent adults include the following: upper airway cough syndrome (UACS), previously referred to as postnasal drip syndrome (PNDS), which is caused by variety of rhinosinus conditions; (2) asthma; (3) GERD; (4) chronic bronchitis from cigarette smoking or other inhaled environmental irritants; (5) nonasthmatic eosinophilic bronchitis; and (6) bronchiectasis.   These conditions, singly or in combination, have accounted for up to 94% of the causes of chronic cough in prospective studies.   Other conditions have constituted no >6% of the causes in prospective studies These have included bronchogenic carcinoma, chronic interstitial pneumonia, sarcoidosis, left ventricular failure, ACEI-induced cough, and aspiration from a condition associated with pharyngeal dysfunction.    Chronic cough is often simultaneously caused by more than one condition. A single cause has been found from 38 to 82% of the time, multiple causes from 18 to 62%. Multiply caused cough has been the result of three diseases up to 42% of the time.       Based on hx he clearly has UACS with cough early on inspiration but he does report some improvement on saba so may also have developed cough variant asthma at this point in the setting of chronic rhinitis ? Allergic. Since he's likely got some form of lymphoma rec he complete that w/u and start on systemic rx and try to control the cough with conservative/ local rx at least to start.   rec -Trial of dulera 100 for the lower airways -1st gen H1 blockers per guidelines  For uacs instead of clariton - note  Intranasal steroids and intranasal antihistamines are effective for symptoms associated with non-allergic rhinitis, whereas second generation antihistamines such as cetirizine,  loratadine and fexofenadine have been found to be ineffective for this condition.  -Continue max rx for gerd/ tessalon and hard rock candy for cyclical throat clearing  - f/u in 6 weeks    Total time devoted to counseling  > 50 % of initial 60 min office visit:  review case with pt/wife Joseph Powell discussion of options/alternatives/ personally creating written customized instructions  in presence of pt  then going over those specific  Instructions directly with the pt including how to use all of the meds but in particular covering each new medication in detail and the difference between the maintenance= "automatic" meds and the prns using an action plan format for the latter (If this problem/symptom => do that organization reading Left to right).  Please see AVS from this visit for a full list of these instructions which I personally wrote for this pt and  are unique to this visit.

## 2017-01-29 ENCOUNTER — Encounter (HOSPITAL_COMMUNITY)
Admission: RE | Admit: 2017-01-29 | Discharge: 2017-01-29 | Disposition: A | Discharge status: Home / Self Care | Payer: BLUE CROSS/BLUE SHIELD | Source: Ambulatory Visit | Attending: Hematology | Admitting: Hematology

## 2017-01-29 DIAGNOSIS — R591 Generalized enlarged lymph nodes: Secondary | ICD-10-CM | POA: Diagnosis not present

## 2017-01-29 DIAGNOSIS — R161 Splenomegaly, not elsewhere classified: Secondary | ICD-10-CM | POA: Insufficient documentation

## 2017-01-29 DIAGNOSIS — R61 Generalized hyperhidrosis: Secondary | ICD-10-CM | POA: Diagnosis not present

## 2017-01-29 DIAGNOSIS — C8591 Non-Hodgkin lymphoma, unspecified, lymph nodes of head, face, and neck: Secondary | ICD-10-CM | POA: Diagnosis not present

## 2017-01-29 DIAGNOSIS — R634 Abnormal weight loss: Secondary | ICD-10-CM | POA: Diagnosis not present

## 2017-01-29 LAB — GLUCOSE, CAPILLARY: Glucose-Capillary: 150 mg/dL — ABNORMAL HIGH (ref 65–99)

## 2017-01-29 MED ORDER — FLUDEOXYGLUCOSE F - 18 (FDG) INJECTION
9.2000 | Freq: Once | INTRAVENOUS | Status: AC | PRN
  Administered 2017-01-29: 9.2 via INTRAVENOUS

## 2017-01-30 ENCOUNTER — Telehealth: Payer: Self-pay

## 2017-01-30 DIAGNOSIS — R61 Generalized hyperhidrosis: Secondary | ICD-10-CM | POA: Diagnosis not present

## 2017-01-30 DIAGNOSIS — R634 Abnormal weight loss: Secondary | ICD-10-CM | POA: Diagnosis not present

## 2017-01-30 DIAGNOSIS — J343 Hypertrophy of nasal turbinates: Secondary | ICD-10-CM | POA: Diagnosis not present

## 2017-01-30 DIAGNOSIS — R590 Localized enlarged lymph nodes: Secondary | ICD-10-CM | POA: Diagnosis not present

## 2017-01-30 NOTE — Telephone Encounter (Signed)
Spoke with pt's wife, [name]. Told her that [name] does want to proceed with biopsy. Wife confirmed that biopsy is scheduled for Monday. MD wants pt to be seen 3-4days post to review PET scan and biopsy. Scheduling message sent.

## 2017-02-02 ENCOUNTER — Telehealth: Payer: Self-pay | Admitting: Hematology

## 2017-02-02 ENCOUNTER — Other Ambulatory Visit: Payer: Self-pay | Admitting: Otolaryngology

## 2017-02-02 ENCOUNTER — Other Ambulatory Visit (HOSPITAL_COMMUNITY)
Admission: RE | Admit: 2017-02-02 | Discharge: 2017-02-02 | Disposition: A | Discharge status: Home / Self Care | Payer: BLUE CROSS/BLUE SHIELD | Source: Ambulatory Visit | Attending: Otolaryngology | Admitting: Otolaryngology

## 2017-02-02 DIAGNOSIS — R591 Generalized enlarged lymph nodes: Secondary | ICD-10-CM | POA: Diagnosis not present

## 2017-02-02 DIAGNOSIS — C8440 Peripheral T-cell lymphoma, not classified, unspecified site: Secondary | ICD-10-CM | POA: Diagnosis not present

## 2017-02-02 DIAGNOSIS — R590 Localized enlarged lymph nodes: Secondary | ICD-10-CM | POA: Diagnosis not present

## 2017-02-02 NOTE — Telephone Encounter (Signed)
Scheduled appts per 9/28 sch msg. Left voicemail for patient and sending him a confirmation letter.

## 2017-02-02 NOTE — Telephone Encounter (Signed)
Faxed records to surgical center of [phone]

## 2017-02-04 ENCOUNTER — Telehealth: Payer: Self-pay | Admitting: Hematology

## 2017-02-04 NOTE — Telephone Encounter (Signed)
GK out. Moved 10/5 appointment to 10/4. Left message for patient.

## 2017-02-05 ENCOUNTER — Ambulatory Visit (HOSPITAL_BASED_OUTPATIENT_CLINIC_OR_DEPARTMENT_OTHER): Payer: BLUE CROSS/BLUE SHIELD | Admitting: Hematology

## 2017-02-05 ENCOUNTER — Other Ambulatory Visit: Payer: Self-pay | Admitting: Hematology

## 2017-02-05 ENCOUNTER — Telehealth: Payer: Self-pay

## 2017-02-05 DIAGNOSIS — C84Z8 Other mature T/NK-cell lymphomas, lymph nodes of multiple sites: Secondary | ICD-10-CM

## 2017-02-05 MED ORDER — DEXAMETHASONE 4 MG PO TABS: 20.0000 mg | ORAL_TABLET | Freq: Every day | ORAL | 0 refills | Status: AC

## 2017-02-05 NOTE — Telephone Encounter (Signed)
Attempt to call pt on home, mobile, and work numbers first with no answer. Got in touch with pt wife on mobile phone. Appointment cancelled today because results of biopsy not yet available. Spoke with pathology and was told it will take one to two more days to gain results. Dr. Melina Copa requested [name] call her back at [phone] to discuss.

## 2017-02-06 ENCOUNTER — Ambulatory Visit: Payer: BLUE CROSS/BLUE SHIELD | Admitting: Hematology

## 2017-02-09 ENCOUNTER — Telehealth: Payer: Self-pay

## 2017-02-09 ENCOUNTER — Ambulatory Visit: Payer: BLUE CROSS/BLUE SHIELD | Admitting: Hematology

## 2017-02-09 NOTE — Telephone Encounter (Signed)
Pt communicated earlier that he was having some trouble sleeping. Tomorrow is the last day of his dexamethasone prescription. [name] okay for pt to take benadryl OTC at night to help with sleep. Pt stated that he will just use tylenol. Having slight discomfort at night and two extra-strength tylenol last night really helped. Does not take much tylenol because it increases a symptom he is already having of night sweats. Pt also noted slight constipation. At this time, he is taking miralax and that is alleviating his symptoms. Pt told that he can take Senna S OTC bid if miralax does not seem to be working as well over the next few days. No other questions from the pt at this time. Pt to call [phone] if he has any changes or questions prior to appt on Friday.

## 2017-02-09 NOTE — Telephone Encounter (Signed)
Communicated with Dr. Melina Copa in Pathology this morning, and full results will not be available from biopsy until Wednesday (02/11/17). Additional staining required. Pt was scheduled for first appointment with [name] today (02/09/17). Called pt wife on cell after trouble reaching the patient. Both had just arrived in the parking lot. Pt and wife offered the option to see [name] today and again later in the week if they preferred. Decided to wait until Friday since Dr. Melina Copa did not think full results would be available until Wednesday at the earliest. Pt c/o trouble sleeping with dexamethasone and some additional gas. Will communicate with [name] if there is anything the patient can do to alleviate these symptoms. Over the phone, could hear patient frustration, and apologized to pt's wife about appointment being changed multiple times. She verbalized understanding and thanks for the call.

## 2017-02-10 ENCOUNTER — Other Ambulatory Visit: Payer: Self-pay | Admitting: Hematology

## 2017-02-10 DIAGNOSIS — C8498 Mature T/NK-cell lymphomas, unspecified, lymph nodes of multiple sites: Secondary | ICD-10-CM | POA: Insufficient documentation

## 2017-02-10 NOTE — Progress Notes (Signed)
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

## 2017-02-12 ENCOUNTER — Encounter: Payer: Self-pay | Admitting: Hematology

## 2017-02-12 ENCOUNTER — Telehealth: Payer: Self-pay

## 2017-02-12 DIAGNOSIS — Z7189 Other specified counseling: Secondary | ICD-10-CM | POA: Insufficient documentation

## 2017-02-12 NOTE — Telephone Encounter (Signed)
Lanelle Bal, RN spoke with pathology tech at [facility]. Pathology report will be resulted 3-5 business days from shipment on 10/9. Created new appt for pt on 10/17, but per wife pt having a lot of symptoms and would like to keep appt with [name] tomorrow.

## 2017-02-13 ENCOUNTER — Ambulatory Visit (HOSPITAL_BASED_OUTPATIENT_CLINIC_OR_DEPARTMENT_OTHER): Payer: BLUE CROSS/BLUE SHIELD

## 2017-02-13 ENCOUNTER — Other Ambulatory Visit: Payer: Self-pay

## 2017-02-13 ENCOUNTER — Ambulatory Visit (HOSPITAL_BASED_OUTPATIENT_CLINIC_OR_DEPARTMENT_OTHER): Payer: BLUE CROSS/BLUE SHIELD | Admitting: Hematology

## 2017-02-13 ENCOUNTER — Ambulatory Visit (HOSPITAL_COMMUNITY)
Admission: RE | Admit: 2017-02-13 | Discharge: 2017-02-13 | Disposition: A | Discharge status: Home / Self Care | Payer: BLUE CROSS/BLUE SHIELD | Source: Ambulatory Visit | Attending: Hematology | Admitting: Hematology

## 2017-02-13 VITALS — BP 107/66 | HR 109 | Temp 99.1°F | Resp 20 | Ht 71.0 in | Wt 173.4 lb

## 2017-02-13 DIAGNOSIS — D696 Thrombocytopenia, unspecified: Secondary | ICD-10-CM | POA: Diagnosis not present

## 2017-02-13 DIAGNOSIS — D739 Disease of spleen, unspecified: Secondary | ICD-10-CM

## 2017-02-13 DIAGNOSIS — I5189 Other ill-defined heart diseases: Secondary | ICD-10-CM | POA: Insufficient documentation

## 2017-02-13 DIAGNOSIS — R109 Unspecified abdominal pain: Secondary | ICD-10-CM

## 2017-02-13 DIAGNOSIS — M549 Dorsalgia, unspecified: Secondary | ICD-10-CM | POA: Diagnosis not present

## 2017-02-13 DIAGNOSIS — R591 Generalized enlarged lymph nodes: Secondary | ICD-10-CM | POA: Diagnosis not present

## 2017-02-13 DIAGNOSIS — C8442 Peripheral T-cell lymphoma, not classified, intrathoracic lymph nodes: Secondary | ICD-10-CM

## 2017-02-13 DIAGNOSIS — C8498 Mature T/NK-cell lymphomas, unspecified, lymph nodes of multiple sites: Secondary | ICD-10-CM

## 2017-02-13 DIAGNOSIS — N3941 Urge incontinence: Secondary | ICD-10-CM

## 2017-02-13 DIAGNOSIS — R161 Splenomegaly, not elsewhere classified: Secondary | ICD-10-CM

## 2017-02-13 DIAGNOSIS — E440 Moderate protein-calorie malnutrition: Secondary | ICD-10-CM

## 2017-02-13 DIAGNOSIS — Z7189 Other specified counseling: Secondary | ICD-10-CM

## 2017-02-13 DIAGNOSIS — E785 Hyperlipidemia, unspecified: Secondary | ICD-10-CM | POA: Diagnosis not present

## 2017-02-13 DIAGNOSIS — R634 Abnormal weight loss: Secondary | ICD-10-CM | POA: Diagnosis not present

## 2017-02-13 DIAGNOSIS — C84Z8 Other mature T/NK-cell lymphomas, lymph nodes of multiple sites: Secondary | ICD-10-CM | POA: Diagnosis not present

## 2017-02-13 DIAGNOSIS — R7989 Other specified abnormal findings of blood chemistry: Secondary | ICD-10-CM

## 2017-02-13 DIAGNOSIS — K769 Liver disease, unspecified: Secondary | ICD-10-CM | POA: Diagnosis not present

## 2017-02-13 DIAGNOSIS — Z85820 Personal history of malignant melanoma of skin: Secondary | ICD-10-CM | POA: Diagnosis not present

## 2017-02-13 LAB — CBC & DIFF AND RETIC
BASO%: 0.5 % (ref 0.0–2.0)
BASOS ABS: 0.1 10*3/uL (ref 0.0–0.1)
EOS ABS: 0 10*3/uL (ref 0.0–0.5)
EOS%: 0 % (ref 0.0–7.0)
HCT: 37.9 % — ABNORMAL LOW (ref 38.4–49.9)
HEMOGLOBIN: 12.8 g/dL — AB (ref 13.0–17.1)
IMMATURE RETIC FRACT: 4.1 % (ref 3.00–10.60)
LYMPH#: 4.4 10*3/uL — AB (ref 0.9–3.3)
LYMPH%: 43.2 % (ref 14.0–49.0)
MCH: 28.4 pg (ref 27.2–33.4)
MCHC: 33.8 g/dL (ref 32.0–36.0)
MCV: 84 fL (ref 79.3–98.0)
MONO#: 0.9 10*3/uL (ref 0.1–0.9)
MONO%: 9 % (ref 0.0–14.0)
NEUT#: 4.8 10*3/uL (ref 1.5–6.5)
NEUT%: 47.3 % (ref 39.0–75.0)
NRBC: 0 % (ref 0–0)
Platelets: 49 10*3/uL — ABNORMAL LOW (ref 140–400)
RBC: 4.51 10*6/uL (ref 4.20–5.82)
RDW: 18.3 % — AB (ref 11.0–14.6)
RETIC %: 3.45 % — AB (ref 0.80–1.80)
RETIC CT ABS: 155.6 10*3/uL — AB (ref 34.80–93.90)
WBC: 10.2 10*3/uL (ref 4.0–10.3)

## 2017-02-13 LAB — TECHNOLOGIST REVIEW

## 2017-02-13 LAB — URINALYSIS, MICROSCOPIC - CHCC
Bilirubin (Urine): NEGATIVE
Glucose: NEGATIVE mg/dL
Ketones: NEGATIVE mg/dL
Leukocyte Esterase: NEGATIVE
NITRITE: NEGATIVE
PROTEIN: 100 mg/dL
SPECIFIC GRAVITY, URINE: 1.025 (ref 1.003–1.035)
UROBILINOGEN UR: 0.2 mg/dL (ref 0.2–1)
pH: 6 (ref 4.6–8.0)

## 2017-02-13 LAB — LACTATE DEHYDROGENASE: LDH: 751 U/L — AB (ref 125–245)

## 2017-02-13 LAB — COMPREHENSIVE METABOLIC PANEL
ALT: 80 U/L — AB (ref 0–55)
AST: 90 U/L — AB (ref 5–34)
Albumin: 3.5 g/dL (ref 3.5–5.0)
Alkaline Phosphatase: 291 U/L — ABNORMAL HIGH (ref 40–150)
Anion Gap: 11 mEq/L (ref 3–11)
BILIRUBIN TOTAL: 1.74 mg/dL — AB (ref 0.20–1.20)
BUN: 23.3 mg/dL (ref 7.0–26.0)
CHLORIDE: 94 meq/L — AB (ref 98–109)
CO2: 26 meq/L (ref 22–29)
CREATININE: 1 mg/dL (ref 0.7–1.3)
Calcium: 9.3 mg/dL (ref 8.4–10.4)
EGFR: >60 mL/min/{1.73_m2} (ref >60)
GLUCOSE: 155 mg/dL — AB (ref 70–140)
Potassium: 4.3 mEq/L (ref 3.5–5.1)
Sodium: 130 mEq/L — ABNORMAL LOW (ref 136–145)
TOTAL PROTEIN: 6.6 g/dL (ref 6.4–8.3)

## 2017-02-13 LAB — URIC ACID: Uric Acid, Serum: 4.8 mg/dl (ref 2.6–7.4)

## 2017-02-13 MED ORDER — LIDOCAINE-PRILOCAINE 2.5-2.5 % EX CREA: TOPICAL_CREAM | CUTANEOUS | 3 refills | Status: DC

## 2017-02-13 MED ORDER — PROCHLORPERAZINE MALEATE 10 MG PO TABS: 10.0000 mg | ORAL_TABLET | Freq: Four times a day (QID) | ORAL | 6 refills | Status: DC | PRN

## 2017-02-13 MED ORDER — ONDANSETRON HCL 8 MG PO TABS: 8.0000 mg | ORAL_TABLET | Freq: Two times a day (BID) | ORAL | 1 refills | Status: DC | PRN

## 2017-02-13 MED ORDER — PREDNISONE 20 MG PO TABS: 60.0000 mg | ORAL_TABLET | Freq: Every day | ORAL | 5 refills | Status: DC

## 2017-02-13 MED ORDER — LORAZEPAM 0.5 MG PO TABS: 0.5000 mg | ORAL_TABLET | Freq: Three times a day (TID) | ORAL | 0 refills | Status: DC | PRN

## 2017-02-13 MED ORDER — DRONABINOL 5 MG PO CAPS: 5.0000 mg | ORAL_CAPSULE | Freq: Two times a day (BID) | ORAL | 0 refills | Status: DC

## 2017-02-13 NOTE — Progress Notes (Signed)
  Echocardiogram 2D Echocardiogram has been performed.  Technically difficult exam due to patient lung interference and small rib spacing. Unable to hold breath during exam to better assess LV function.   Corwin Kuiken L Androw 02/13/2017, 10:45 AM

## 2017-02-13 NOTE — Progress Notes (Signed)
HEMATOLOGY/ONCOLOGY CONSULTATION NOTE  Date of Service: 02/13/2017  Patient Care Team: Hulan Fess, MD as PCP - General (Family Medicine)  CHIEF COMPLAINTS/PURPOSE OF CONSULTATION:  Suspicion for Lymphoma vs malignancy or unknown primary  HISTORY OF PRESENTING ILLNESS:   Joseph Powell is a wonderful 61 y.o. male who has been referred to [place] by Dr. Hulan Fess, PCP, for evaluation of symptoms and CT Abd pelvis concerning for lymphoma.   He presents to the clinic today with his wife.  He notes a cough that had been coming and going for 3 years now. It worsens with allergies and his asthma. Originally it was contributed to acid reflux but has not had experienced heartburn. This last episode of coughing prompted him to get an endoscopy 1-2 months ago which he reports showed no acid reflux or overt esophagitis.   In April he developed asthmatic bronchitis and was given '10mg'$  prednisone tablets taken 4 for 4 days and tapered down for 12 days to help resolve it.   Recently he has developed severe drenching night sweats which he was experiencing intermittently for 1-2 months. He reports to purposeful 50 pounds weight loss over the last 10 months orf which he rapidly and unexpectedly lost 20 pounds over the last month.   He notes that he has previously had abdominal pain thought to be the gall bladder and has significant abdominal pain once a few weeks ago for which he went to the emergency room but then decided to leave due to significant waiting times in the emergency room. He subsequently followed up with his primary care physician and had an outpatient CT abdomen and pelvis on 01/21/2017 which showed Extensive retroperitoneal, mesenteric and left pelvic lymphadenopathy. Splenomegaly with numerous low-attenuation splenic masses. Several small low-attenuation liver masses. The leading consideration is lymphoma, although metastatic disease is on the differential.  Patient reports that he  developed a knot in his left lower neck which is clinically obviously palpable about 3-4 days ago. No overt fevers or chills No sick contacts.  Recent EGD showed no significant abnormalities. Previous colonoscopy was about 2 years ago and showed no evidence of malignancy.  He denies leg swelling or testicular discomfort.  His gout is stable and his sleep apnea is better since he lost weight. He denies sore throat, headaches or change in vision. He has not had blood transfusion or tattoos.  Patient reports no other specific focal symptoms.  He has had a bothersome cough and wonders if he has any disease in his chest. He has an appointment to see a pulmonologist tomorrow.  His Melanoma was below his rt shoulder blade and was removed in 2009. He follows up yearly with Dermatologist Dr. Allyson Sabal.   He previously saw Dr. Inda Merlin at [facility] for Low platelet in 2012 (down to 85K) that was likely attributed to his medication Relafen. His counts are back toward 125K on repeat labs about a week ago.  He does not note any family history of blood disorder or cancer. He has no history of smoking.  He previously worked in Tax adviser for 30 years and was indirectly around chemicals/VOC's.   INTERVAL HISTORY:   Joseph Powell returns today for routine follow up accompanied by his wife. Since we last met, he reports that he has had continued pain wrapping around his upper abdomen. He was placed Dexamethasone that this has kept him up a night. He saw Dr Redmond Baseman yesterday who informed him to take Benadryl and this helped him  sleep well last night. He is now finished with these steroid. Additionally, his wife reports that he has been experiencing urgency with some urinary incontinence which he has not previously experienced. He denies any dysuria associated with this. He also reports that he has continued to have night sweats and this has been bothersome for him. His appetite has overall been fair recently. He  has continued to lose weight, per his wife. Of note, he had an ECHO performed today which shoed unchanged LVEF from prior.   MEDICAL HISTORY:  Past Medical History:  Diagnosis Date  . Allergic rhinitis   . Asthma    slight  . Bell's palsy   . GERD (gastroesophageal reflux disease)   . Gout   . Hypercholesteremia   . Melanoma ([place])   . Osteoarthritis   . Sleep apnea    Resolving  . Thrombocytopenia ([place])   . Tubular adenoma     SURGICAL HISTORY: Past Surgical History:  Procedure Laterality Date  . KNEE SURGERY  1993  . MELANOMA EXCISION    . NASAL RECONSTRUCTION    . VASECTOMY  1999    SOCIAL HISTORY: Social History   Social History  . Marital status: Married    Spouse name: N/A  . Number of children: N/A  . Years of education: N/A   Occupational History  . Risk analyst    Social History Main Topics  . Smoking status: Never Smoker  . Smokeless tobacco: Never Used  . Alcohol use 1.0 oz/week    2 Standard drinks or equivalent per week     Comment: socially  . Drug use: No  . Sexual activity: Not on file   Other Topics Concern  . Not on file   Social History Narrative  . No narrative on file    FAMILY HISTORY: Family History  Problem Relation Age of Onset  . Diabetes Mother   . Asthma Mother   . Gout Brother   . Colon cancer Paternal Uncle   . Esophageal cancer Neg Hx   . Stomach cancer Neg Hx     ALLERGIES:  is allergic to penicillins.  MEDICATIONS:  Current Outpatient Prescriptions  Medication Sig Dispense Refill  . albuterol (PROVENTIL HFA;VENTOLIN HFA) 108 (90 Base) MCG/ACT inhaler Inhale 1-2 puffs into the lungs every 6 (six) hours as needed for wheezing or shortness of breath.    . allopurinol (ZYLOPRIM) 100 MG tablet Take 100 mg by mouth 2 (two) times daily.    Marland Kitchen aspirin EC 81 MG tablet Take 1 tablet (81 mg total) by mouth daily. (Patient not taking: Reported on 01/27/2017)    . benzonatate (TESSALON) 200 MG capsule Take 1 capsule (200  mg total) by mouth 3 (three) times daily as needed for cough. 45 capsule 1  . co-enzyme Q-10 30 MG capsule Take 30 mg by mouth daily.    Marland Kitchen glucosamine-chondroitin 500-400 MG tablet Take 1 tablet by mouth 2 (two) times daily.     . mometasone-formoterol (DULERA) 100-5 MCG/ACT AERO Inhale 2 puffs into the lungs 2 (two) times daily. 1 Inhaler 11  . nabumetone (RELAFEN) 750 MG tablet Take 1 tablet by mouth as needed.  0  . pantoprazole (PROTONIX) 40 MG tablet Take 40 mg by mouth daily.     . pravastatin (PRAVACHOL) 40 MG tablet Take 40 mg by mouth daily.      . Turmeric Curcumin 500 MG CAPS Take 1 tablet by mouth daily.     Current Facility-Administered Medications  Medication Dose Route Frequency Provider Last Rate Last Dose  . 0.9 %  sodium chloride infusion  500 mL Intravenous Continuous Milus Banister, MD        REVIEW OF SYSTEMS:    10 Point review of Systems was done is negative except as noted above.  PHYSICAL EXAMINATION: ECOG PERFORMANCE STATUS: 1 - Symptomatic but completely ambulatory  . Vitals:   02/13/17 1208  BP: 107/66  Pulse: (!) 109  Resp: 20  Temp: 99.1 F (37.3 C)  SpO2: 100%   Filed Weights   02/13/17 1208  Weight: 173 lb 6.4 oz (78.7 kg)   .Body mass index is 24.18 kg/m.  GENERAL:alert, in no acute distress and comfortable SKIN: no acute rashes, no significant lesions EYES: conjunctiva are pink and non-injected, sclera anicteric OROPHARYNX: MMM, no exudates, no oropharyngeal erythema or ulceration NECK: supple, no JVD LYMPH:  (+) 2 palpable lymphadenopathy in the cervical left lower neck and no obvious lymphadenopathy in axillary or inguinal regions that I could appreciate LUNGS: clear to auscultation b/l with normal respiratory effort HEART: regular rate & rhythm ABDOMEN:  normoactive bowel sounds , non tender, not distended. Borderline palpable splenomegaly, no overt hepatomegaly.  Extremity: no pedal edema PSYCH: alert & oriented x 3 with fluent  speech NEURO: no focal motor/sensory deficits  LABORATORY DATA:  I have reviewed the data as listed  . CBC Latest Ref Rng & Units 02/13/2017  WBC 4.0 - 10.3 10e3/uL 10.2  Hemoglobin 13.0 - 17.1 g/dL 12.8(L)  Hematocrit 38.4 - 49.9 % 37.9(L)  Platelets 140 - 400 10e3/uL 49(L)    . CMP Latest Ref Rng & Units 01/26/2017  Glucose 70 - 140 mg/dl 200(H)  BUN 7.0 - 26.0 mg/dL 18.1  Creatinine 0.7 - 1.3 mg/dL 0.8  Sodium 136 - 145 mEq/L 136  Potassium 3.5 - 5.1 mEq/L 4.4  Chloride 101 - 111 mmol/L -  CO2 22 - 29 mEq/L 29  Calcium 8.4 - 10.4 mg/dL 9.7  Total Protein 6.4 - 8.3 g/dL 6.6  Total Bilirubin 0.20 - 1.20 mg/dL 1.02  Alkaline Phos 40 - 150 U/L 218(H)  AST 5 - 34 U/L 64(H)  ALT 0 - 55 U/L 69(H)   Component     Latest Ref Rng & Units 01/26/2017  HIV Screen 4th Generation wRfx     Non Reactive Non Reactive  Hep C Virus Ab     0.0 - 0.9 s/co ratio <0.1  LDH     125 - 245 U/L 522 (H)  Sed Rate     0 - 30 mm/hr 6  Hep B Core Ab, Tot     Negative Negative  Hepatitis B Surface Ag     Negative Negative    RADIOGRAPHIC STUDIES: I have personally reviewed the radiological images as listed and agreed with the findings in the report. Ct Abdomen Pelvis W Contrast  Result Date: 01/21/2017 CLINICAL DATA:  Elevated liver function tests. History of back melanoma. EXAM: CT ABDOMEN AND PELVIS WITH CONTRAST TECHNIQUE: Multidetector CT imaging of the abdomen and pelvis was performed using the standard protocol following bolus administration of intravenous contrast. CONTRAST:  113m ISOVUE-300 IOPAMIDOL (ISOVUE-300) INJECTION 61% COMPARISON:  10/09/2015 abdominal sonogram. FINDINGS: Lower chest: Mild patchy tree-in-bud opacities in the dependent lower lobes bilaterally. Coronary atherosclerosis. Hepatobiliary: Normal liver size. At least 4 scattered hypodense liver lesions, largest 1.6 cm in the segment 7 right liver lobe (series 2/ image 16) and 1.5 cm in the segment 2 left liver lobe  (  series 2/image 17). Normal gallbladder with no radiopaque cholelithiasis. No biliary ductal dilatation. Pancreas: Normal, with no mass or duct dilation. Spleen: Mildly enlarged spleen (craniocaudal splenic length 13.0 cm). Multiple (at least 10) hypodense masses scattered throughout the spleen, largest 2.8 cm (series 2/ image 29). Adrenals/Urinary Tract: Normal adrenals. No hydronephrosis. No renal masses. Normal bladder. Stomach/Bowel: Grossly normal stomach. Normal caliber small bowel with no small bowel wall thickening. Normal appendix. Normal large bowel with no diverticulosis, large bowel wall thickening or pericolonic fat stranding. Vascular/Lymphatic: Atherosclerotic nonaneurysmal abdominal aorta. Patent portal, splenic, hepatic and renal veins. Gastrohepatic ligament lymphadenopathy measuring up to 1.6 cm (series 2/ image 20). Enlarged 2.3 cm portacaval node (series 2/ image 30). Multiple enlarged mesenteric lymph nodes, largest 3.4 cm in the right mesentery (series 2/ image 49). Right retrocrural adenopathy measuring up to the 2.1 cm (series 2/ image 29). Multiple enlarged left para-aortic nodes, largest 2.1 cm (series 2/ image 39). Left external iliac lymphadenopathy measuring up to 1.2 cm (series 2/ image 73). Reproductive: Top-normal size prostate with nonspecific internal prostatic calcifications. Other: No pneumoperitoneum. No focal fluid collection. Small volume free fluid in the deep pelvis. Musculoskeletal: No aggressive appearing focal osseous lesions. Mild thoracolumbar spondylosis. IMPRESSION: 1. Extensive retroperitoneal, mesenteric and left pelvic lymphadenopathy. Splenomegaly with numerous low-attenuation splenic masses. Several small low-attenuation liver masses. The leading consideration is lymphoma, although metastatic disease is on the differential given the history of melanoma. 2. Mild patchy tree-in-bud opacities in the dependent lung bases bilaterally, most compatible with a  nonspecific mild infectious or inflammatory bronchiolitis, with the differential including aspiration. 3.  Aortic Atherosclerosis (ICD10-I70.0).  Coronary atherosclerosis. Electronically Signed   By: Ilona Sorrel M.D.   On: 01/21/2017 11:42   Nm Pet Image Initial (pi) Skull Base To Thigh  Result Date: 01/29/2017 CLINICAL DATA:  Initial treatment strategy for lymphoma. EXAM: NUCLEAR MEDICINE PET SKULL BASE TO THIGH TECHNIQUE: 9.2 mCi F-18 FDG was injected intravenously. Full-ring PET imaging was performed from the skull base to thigh after the radiotracer. CT data was obtained and used for attenuation correction and anatomic localization. FASTING BLOOD GLUCOSE:  Value: 150 mg/dl COMPARISON:  None. FINDINGS: NECK: Bilateral hypermetabolic cervical lymph nodes identified. Right level-II lymph node measures 5 mm and has an SUV max equal to 6.7. 9 mm right supraclavicular lymph node is identified within SUV max equal to 11.18. Left supraclavicular lymph node measures 6 mm and has an SUV max equal to 8.85. CHEST: Extensive mediastinal and bilateral hilar adenopathy identified. There is also bilateral internal mammary lymph node chain hypermetabolic adenopathy. Nodal mass within the pre-vascular region of the anterior mediastinum measures 7 cm and has an SUV max equal to 28.0. Left paratracheal lymph node measures 1.5 cm and has an SUV max equal to 23.68. 1.3 cm sub- carinal lymph node is identified with an SUV max equal to 14.6. No pleural effusion.  No hypermetabolic pulmonary nodules. ABDOMEN/PELVIS: No focal areas of increased uptake within the liver. The spleen measures 16.9 x 7.0 x 12.8 cm (volume = 790 cm^3). Multiple hypermetabolic splenic lesions identified. Within the anterior spleen there is a 1.9 cm hypermetabolic lesion with an SUV max equal to 11.6. Extensive hypermetabolic adenopathy within the abdomen. Ileocolic mesenteric nodal mass measures 5.1 cm and has an SUV max equal to 16.6. Left periaortic  nodal mass measures 5.9 cm and has an SUV max equal to 24.76. Retrocrural node measures 2.1 cm and has an SUV max equal to 19.03. Bilateral hypermetabolic iliac nodes  are identified. Measures 1.3 cm and has an SUV max equal to 21.2. Right external iliac node measures 1.2 cm and has an SUV max equal to 14.5. SKELETON: Extensive hypermetabolic tumor is identified throughout the axial and appendicular skeleton. Hypermetabolic lesion within the proximal right humerus has an SUV max equal to 10.77. Hypermetabolic lesion within the right iliac bone has an SUV max equal to 11.25. Within the proximal right femur there is a intensely hypermetabolic lesion with an SUV max equal to 12.32. FDG uptake within the T11 vertebra has an SUV max equal to 14.5. Within the sternum hypermetabolic tumor has an SUV max equal to 10.95. IMPRESSION: 1. Examination is positive for extensive hypermetabolic adenopathy within the neck, chest, abdomen and pelvis. 2. Splenomegaly. Multifocal hypermetabolic splenic lesions are noted. 3. Extensive hypermetabolic tumor involvement of the axial and appendicular skeleton. Electronically Signed   By: Kerby Moors M.D.   On: 01/29/2017 14:55    ASSESSMENT & PLAN:    Joseph Powell is a 61 y.o. male with   #1 Generalized lymphadenopathy with splenomegaly and elevated LDH -- high likelihood of intermediate to High grade T cell Lymphoma. stage IV T-cell Non-Hodgkin's Lymphoma, subtype TBD  CT abd/pelvis - 01/21/2017- Extensive retroperitoneal, mesenteric and left pelvic lymphadenopathy. Splenomegaly with numerous low-attenuation splenic masses. Several small low-attenuation liver masses. The leading consideration is lymphoma, although metastatic disease with unknown primary in on the differential.  Recent PET imaging concluded with findings consistent with positive hypermetabolic activity within the chest, abdomen, pelvis, spleen, and axial and appendicular skeleton. With these findings and  bone marrow involvement, this is consistent with Stage IV disease. Additionally, per Dr Kathe Mariner initial review of his pathology this is consistent with T-cell NHL, subtype to be determined.  -Labs have deemed him negative for HIV, HepB/C.   #2 Clinical evidence of left lower anterior cervical Lymphadenopathy  #3 Acute on chronic Thrombocytopenia. Patient has has some chronic thrombocytopenia 85-125k for a few years. Platelets today are down to 56k in the setting of newly diagnosed lymphoma/malignancy of unknown primary. Could be from hypersplenism.  #4 abnormal liver function tests likely related to liver involvement by tumor.  Plan -The labs were reviewed. -He was recommended to avoid NSAIDs. -Avoid hepatotoxic medications. -Still waiting for Dr Kathe Mariner input from pathology. So far, this appears to be NHL. I explained to him that there are ~70 subtypes of NHL, and that his typing on initial review appears to be a T-cell lymphoma. We will confirm the subtype of his T-cell lymphoma when pathology finishes their screening.  -Additionally, his PET scan is showing that there is involvement within the bone marrow -Due to the progression of his disease, and given his PET scan/lab results, with involvement of the bone marrow on PET his staging is stage IV.  -I discussed with him based on these results that his overall diagnosis is stage IV T-cell Non-Hodgkin's Lymphoma, subtype TBD and we would begin CHOEP  with curative treatment intents. We will plan for 6 cycles of this with Neulasta support. We will begin this ASAP. -Subtype pending, we will refer him to transplant centers in the area, likely [facility], for evaluation for recommendation regarding indication for bone marrow transplant if he acheives CR after planned treatment. -ECHO normal.  -Port placement on 02/17/17 -Chemo class on 02/19/17 -We will obtain labs to begin platelet trending  -symptomatically I expect him to improve with treatment     #2 History of Melanoma, 2009 -Followed by Dr. Allyson Sabal for  continued skin screening   #3 Patient Active Problem List   Diagnosis Date Noted  . Counseling regarding advanced care planning and goals of care 02/12/2017  . Mature T/NK-cell lymphomas, unspecified, lymph nodes of multiple sites ([place]) 02/10/2017  . Cough variant asthma vs UACS 01/27/2017  . Dyslipidemia 08/05/2016  . Mild asthma 08/05/2016  . DJD (degenerative joint disease) 08/05/2016  . HLD (hyperlipidemia) 07/27/2016  . GERD (gastroesophageal reflux disease) 07/27/2016  . Gout 07/27/2016  . Discomfort in chest 07/27/2016  . OSA (obstructive sleep apnea) 09/19/2013  . Thrombocytopenia ([place]) 04/14/2011  . SCHATZKI'S RING 08/21/2009  -Elevated Liver Enzymes  PLAN:  -I advised him to see pulmonologist and to avoid high does steroids at this time -I advice he can use tylenol for his aches instead of aleve due to his thrombocytopenia.    labs today Dietician referral Start CHOEP on Wednesday 02/18/2017 (3 days -will need to start on Wednesday) Reschedule chemo-counseling for 10/16 or 10/17 Has f/u with me on 10/17 pre-treatment with me    All of the patients questions were answered with apparent satisfaction. The patient knows to call the clinic with any problems, questions or concerns.  I spent 25 minutes counseling the patient face to face. The total time spent in the appointment was 40 minutes and more than 50% was on counseling and direct patient cares.   Sullivan Lone MD MS AAHIVMS [facility] Hematology/Oncology Physician [facility]  (Office):       [phone] (Work cell):  [phone] (Fax):           [phone]  02/13/2017 12:45 PM  This document serves as a record of services personally performed by Sullivan Lone, MD. It was created on his behalf by Reola Mosher, a trained medical scribe. The creation of this record is based on the scribe's personal observations and the provider's  statements to them. This document has been checked and approved by the attending provider.

## 2017-02-15 ENCOUNTER — Other Ambulatory Visit: Payer: Self-pay | Admitting: Radiology

## 2017-02-16 ENCOUNTER — Other Ambulatory Visit: Payer: Self-pay | Admitting: Radiology

## 2017-02-16 DIAGNOSIS — L821 Other seborrheic keratosis: Secondary | ICD-10-CM | POA: Diagnosis not present

## 2017-02-16 DIAGNOSIS — D229 Melanocytic nevi, unspecified: Secondary | ICD-10-CM | POA: Diagnosis not present

## 2017-02-16 DIAGNOSIS — Z85820 Personal history of malignant melanoma of skin: Secondary | ICD-10-CM | POA: Diagnosis not present

## 2017-02-16 DIAGNOSIS — L814 Other melanin hyperpigmentation: Secondary | ICD-10-CM | POA: Diagnosis not present

## 2017-02-17 ENCOUNTER — Other Ambulatory Visit: Payer: Self-pay | Admitting: Hematology

## 2017-02-17 ENCOUNTER — Encounter (HOSPITAL_COMMUNITY): Payer: Self-pay

## 2017-02-17 ENCOUNTER — Ambulatory Visit (HOSPITAL_COMMUNITY)
Admission: RE | Admit: 2017-02-17 | Discharge: 2017-02-17 | Disposition: A | Discharge status: Home / Self Care | Payer: BLUE CROSS/BLUE SHIELD | Source: Ambulatory Visit | Attending: Hematology | Admitting: Hematology

## 2017-02-17 ENCOUNTER — Other Ambulatory Visit: Payer: Self-pay | Admitting: Student

## 2017-02-17 DIAGNOSIS — Z7952 Long term (current) use of systemic steroids: Secondary | ICD-10-CM | POA: Diagnosis not present

## 2017-02-17 DIAGNOSIS — C8590 Non-Hodgkin lymphoma, unspecified, unspecified site: Secondary | ICD-10-CM | POA: Diagnosis not present

## 2017-02-17 DIAGNOSIS — C84Z8 Other mature T/NK-cell lymphomas, lymph nodes of multiple sites: Secondary | ICD-10-CM

## 2017-02-17 DIAGNOSIS — E7800 Pure hypercholesterolemia, unspecified: Secondary | ICD-10-CM | POA: Diagnosis not present

## 2017-02-17 DIAGNOSIS — Z880 Allergy status to penicillin: Secondary | ICD-10-CM | POA: Diagnosis not present

## 2017-02-17 DIAGNOSIS — M109 Gout, unspecified: Secondary | ICD-10-CM | POA: Insufficient documentation

## 2017-02-17 DIAGNOSIS — Z7982 Long term (current) use of aspirin: Secondary | ICD-10-CM | POA: Diagnosis not present

## 2017-02-17 DIAGNOSIS — M1990 Unspecified osteoarthritis, unspecified site: Secondary | ICD-10-CM | POA: Insufficient documentation

## 2017-02-17 DIAGNOSIS — G4730 Sleep apnea, unspecified: Secondary | ICD-10-CM | POA: Diagnosis not present

## 2017-02-17 DIAGNOSIS — J45909 Unspecified asthma, uncomplicated: Secondary | ICD-10-CM | POA: Insufficient documentation

## 2017-02-17 DIAGNOSIS — K219 Gastro-esophageal reflux disease without esophagitis: Secondary | ICD-10-CM | POA: Insufficient documentation

## 2017-02-17 DIAGNOSIS — Z5111 Encounter for antineoplastic chemotherapy: Secondary | ICD-10-CM | POA: Diagnosis not present

## 2017-02-17 DIAGNOSIS — C8490 Mature T/NK-cell lymphomas, unspecified, unspecified site: Secondary | ICD-10-CM | POA: Diagnosis not present

## 2017-02-17 HISTORY — PX: IR US GUIDE VASC ACCESS RIGHT: IMG2390

## 2017-02-17 HISTORY — PX: IR FLUORO GUIDE PORT INSERTION RIGHT: IMG5741

## 2017-02-17 LAB — CBC WITH DIFFERENTIAL/PLATELET
BASOS PCT: 1 %
Basophils Absolute: 0.1 10*3/uL (ref 0.0–0.1)
EOS PCT: 0 %
Eosinophils Absolute: 0 10*3/uL (ref 0.0–0.7)
HCT: 34.6 % — ABNORMAL LOW (ref 39.0–52.0)
Hemoglobin: 11.5 g/dL — ABNORMAL LOW (ref 13.0–17.0)
LYMPHS ABS: 1.2 10*3/uL (ref 0.7–4.0)
Lymphocytes Relative: 18 %
MCH: 27.9 pg (ref 26.0–34.0)
MCHC: 33.2 g/dL (ref 30.0–36.0)
MCV: 84 fL (ref 78.0–100.0)
MONO ABS: 2.2 10*3/uL — AB (ref 0.1–1.0)
Monocytes Relative: 34 %
Neutro Abs: 3 10*3/uL (ref 1.7–7.7)
Neutrophils Relative %: 47 %
PLATELETS: 55 10*3/uL — AB (ref 150–400)
RBC: 4.12 MIL/uL — AB (ref 4.22–5.81)
RDW: 17.9 % — AB (ref 11.5–15.5)
WBC: 6.5 10*3/uL (ref 4.0–10.5)

## 2017-02-17 LAB — PROTIME-INR
INR: 1.09
Prothrombin Time: 14 seconds (ref 11.4–15.2)

## 2017-02-17 MED ORDER — SODIUM CHLORIDE 0.9 % IV SOLN
INTRAVENOUS | Status: DC
  Administered 2017-02-17: 10:00:00 via INTRAVENOUS

## 2017-02-17 MED ORDER — MIDAZOLAM HCL 2 MG/2ML IJ SOLN
INTRAMUSCULAR | Status: AC
  Filled 2017-02-17: qty 4

## 2017-02-17 MED ORDER — HEPARIN SOD (PORK) LOCK FLUSH 100 UNIT/ML IV SOLN
INTRAVENOUS | Status: AC | PRN
  Administered 2017-02-17: 500 [IU] via INTRAVENOUS

## 2017-02-17 MED ORDER — LIDOCAINE HCL 1 % IJ SOLN
INTRAMUSCULAR | Status: AC
  Filled 2017-02-17: qty 20

## 2017-02-17 MED ORDER — FENTANYL CITRATE (PF) 100 MCG/2ML IJ SOLN
INTRAMUSCULAR | Status: AC
  Filled 2017-02-17: qty 4

## 2017-02-17 MED ORDER — CLINDAMYCIN PHOSPHATE 600 MG/50ML IV SOLN
600.0000 mg | Freq: Once | INTRAVENOUS | Status: AC
  Administered 2017-02-17: 600 mg via INTRAVENOUS
  Filled 2017-02-17: qty 50

## 2017-02-17 MED ORDER — LIDOCAINE HCL 1 % IJ SOLN
INTRAMUSCULAR | Status: AC | PRN
  Administered 2017-02-17: 20 mL

## 2017-02-17 MED ORDER — HEPARIN SOD (PORK) LOCK FLUSH 100 UNIT/ML IV SOLN
INTRAVENOUS | Status: AC
  Filled 2017-02-17: qty 5

## 2017-02-17 MED ORDER — FENTANYL CITRATE (PF) 100 MCG/2ML IJ SOLN
INTRAMUSCULAR | Status: AC | PRN
  Administered 2017-02-17 (×2): 50 ug via INTRAVENOUS

## 2017-02-17 MED ORDER — MIDAZOLAM HCL 2 MG/2ML IJ SOLN
INTRAMUSCULAR | Status: AC | PRN
  Administered 2017-02-17 (×2): 1 mg via INTRAVENOUS

## 2017-02-17 NOTE — Discharge Instructions (Signed)
Moderate Conscious Sedation, Adult, Care After These instructions provide you with information about caring for yourself after your procedure. Your health care provider may also give you more specific instructions. Your treatment has been planned according to current medical practices, but problems sometimes occur. Call your health care provider if you have any problems or questions after your procedure. What can I expect after the procedure? After your procedure, it is common:  To feel sleepy for several hours.  To feel clumsy and have poor balance for several hours.  To have poor judgment for several hours.  To vomit if you eat too soon.  Follow these instructions at home: For at least 24 hours after the procedure:   Do not: ? Participate in activities where you could fall or become injured. ? Drive. ? Use heavy machinery. ? Drink alcohol. ? Take sleeping pills or medicines that cause drowsiness. ? Make important decisions or sign legal documents. ? Take care of children on your own.  Rest. Eating and drinking  Follow the diet recommended by your health care provider.  If you vomit: ? Drink water, juice, or soup when you can drink without vomiting. ? Make sure you have little or no nausea before eating solid foods. General instructions  Have a responsible adult stay with you until you are awake and alert.  Take over-the-counter and prescription medicines only as told by your health care provider.  If you smoke, do not smoke without supervision.  Keep all follow-up visits as told by your health care provider. This is important. Contact a health care provider if:  You keep feeling nauseous or you keep vomiting.  You feel light-headed.  You develop a rash.  You have a fever. Get help right away if:  You have trouble breathing. This information is not intended to replace advice given to you by your health care provider. Make sure you discuss any questions you have  with your health care provider. Document Released: 02/09/2013 Document Revised: 09/24/2015 Document Reviewed: 08/11/2015 Elsevier Interactive Patient Education  2018 [place] Insertion, Care After This sheet gives you information about how to care for yourself after your procedure. Your health care provider may also give you more specific instructions. If you have problems or questions, contact your health care provider. What can I expect after the procedure? After your procedure, it is common to have:  Discomfort at the port insertion site.  Bruising on the skin over the port. This should improve over 3-4 days.  Follow these instructions at home: [facility] care  After your port is placed, you will get a manufacturer's information card. The card has information about your port. Keep this card with you at all times.  Take care of the port as told by your health care provider. Ask your health care provider if you or a family member can get training for taking care of the port at home. A home health care nurse may also take care of the port.  Make sure to remember what type of port you have. Incision care  Follow instructions from your health care provider about how to take care of your port insertion site. Make sure you: ? Wash your hands with soap and water before you change your bandage (dressing). If soap and water are not available, use hand sanitizer. ? Change your dressing as told by your health care provider.  You may remove the dressing tomorrow 02/18/17.  ? Leave skin glue in place. These  skin closures may need to stay in place for 2 weeks or longer. Do not remove adhesive strips completely unless your health care provider tells you to do that.  DO NOT use EMLA cream for the first 2 weeks after port placement as this cream will remove surgical glue.  Check your port insertion site every day for signs of infection. Check for: ? More redness, swelling, or  pain. ? More fluid or blood. ? Warmth. ? Pus or a bad smell. General instructions  Do not take baths, swim, or use a hot tub until your health care provider approves.  You may shower tomorrow 02/18/17.  Do not lift anything that is heavier than 10 lb (4.5 kg) for a week, or as told by your health care provider.  Ask your health care provider when it is okay to: ? Return to work or school. ? Resume usual physical activities or sports.  Do not drive for 24 hours if you were given a medicine to help you relax (sedative).  Take over-the-counter and prescription medicines only as told by your health care provider.  Wear a medical alert bracelet in case of an emergency. This will tell any health care providers that you have a port.  Keep all follow-up visits as told by your health care provider. This is important. Contact a health care provider if:  You have a fever or chills.  You have more redness, swelling, or pain around your port insertion site.  You have more fluid or blood coming from your port insertion site.  Your port insertion site feels warm to the touch.  You have pus or a bad smell coming from the port insertion site. Get help right away if:  You have chest pain or shortness of breath.  You have bleeding from your port that you cannot control. Summary  Take care of the port as told by your health care provider.  Change your dressing as told by your health care provider.  Keep all follow-up visits as told by your health care provider. This information is not intended to replace advice given to you by your health care provider. Make sure you discuss any questions you have with your health care provider. Document Released: 02/09/2013 Document Revised: 03/12/2016 Document Reviewed: 03/12/2016 Elsevier Interactive Patient Education  2017 [employer].

## 2017-02-17 NOTE — Progress Notes (Signed)
HEMATOLOGY/ONCOLOGY CONSULTATION NOTE  Date of Service: 02/18/2017  Patient Care Team: Hulan Fess, MD as PCP - General (Family Medicine)  CHIEF COMPLAINTS/PURPOSE OF CONSULTATION:   F/u for newly diagnosed T cell NHL  HISTORY OF PRESENTING ILLNESS:   Joseph Powell is a wonderful 61 y.o. male  Is here for his pre-treatment f/u.  His platelet count as of 02/17/2017 is 55k without significatn bump with high dose Dexamethasone.  Pt presents to the office today accompanied by his wife. He reports that he is doing well overall. He recently had his port placed and was connected for chemo education this morning at 10. He states that he is now able to sleep with ativan. He recently started Marinol to help with his appetite.Pt notes some improvement in his constitutional symptoms with steroids.  On review of systems, pt reports urinary symptoms improved, and denies fever, chills and any other acute symptoms.    MEDICAL HISTORY:  Past Medical History:  Diagnosis Date  . Allergic rhinitis   . Asthma    slight  . Bell's palsy   . GERD (gastroesophageal reflux disease)   . Gout   . Hypercholesteremia   . Melanoma ([place])   . Osteoarthritis   . Sleep apnea    Resolving  . Thrombocytopenia ([place])   . Tubular adenoma     SURGICAL HISTORY: Past Surgical History:  Procedure Laterality Date  . IR FLUORO GUIDE PORT INSERTION RIGHT  02/17/2017  . IR US GUIDE VASC ACCESS RIGHT  02/17/2017  . KNEE SURGERY  1993  . MELANOMA EXCISION    . NASAL RECONSTRUCTION    . VASECTOMY  1999    SOCIAL HISTORY: Social History   Social History  . Marital status: Married    Spouse name: N/A  . Number of children: N/A  . Years of education: N/A   Occupational History  . Risk analyst    Social History Main Topics  . Smoking status: Never Smoker  . Smokeless tobacco: Never Used  . Alcohol use 1.0 oz/week    2 Standard drinks or equivalent per week     Comment: socially  . Drug use:  No  . Sexual activity: Not on file   Other Topics Concern  . Not on file   Social History Narrative  . No narrative on file    FAMILY HISTORY: Family History  Problem Relation Age of Onset  . Diabetes Mother   . Asthma Mother   . Gout Brother   . Colon cancer Paternal Uncle   . Esophageal cancer Neg Hx   . Stomach cancer Neg Hx     ALLERGIES:  is allergic to penicillins.  MEDICATIONS:  Current Outpatient Prescriptions  Medication Sig Dispense Refill  . albuterol (PROVENTIL HFA;VENTOLIN HFA) 108 (90 Base) MCG/ACT inhaler Inhale 1-2 puffs into the lungs every 6 (six) hours as needed for wheezing or shortness of breath.    . allopurinol (ZYLOPRIM) 100 MG tablet Take 100 mg by mouth 2 (two) times daily.    . benzonatate (TESSALON) 200 MG capsule Take 1 capsule (200 mg total) by mouth 3 (three) times daily as needed for cough. 45 capsule 1  . dronabinol (MARINOL) 5 MG capsule Take 1 capsule (5 mg total) by mouth 2 (two) times daily before a meal. 60 capsule 0  . lidocaine-prilocaine (EMLA) cream Apply to affected area once 30 g 3  . LORazepam (ATIVAN) 0.5 MG tablet Take 1 tablet (0.5 mg total) by mouth every  8 (eight) hours as needed for anxiety. 30 tablet 0  . mometasone-formoterol (DULERA) 100-5 MCG/ACT AERO Inhale 2 puffs into the lungs 2 (two) times daily. 1 Inhaler 11  . ondansetron (ZOFRAN) 8 MG tablet Take 1 tablet (8 mg total) by mouth 2 (two) times daily as needed for refractory nausea / vomiting. Start on day 3 after each chemotherapy 30 tablet 1  . pantoprazole (PROTONIX) 40 MG tablet Take 40 mg by mouth daily.     . pravastatin (PRAVACHOL) 40 MG tablet Take 40 mg by mouth daily.      . predniSONE (DELTASONE) 20 MG tablet Take 3 tablets (60 mg total) by mouth daily. Take on days 1-5 of chemotherapy. 15 tablet 5  . prochlorperazine (COMPAZINE) 10 MG tablet Take 1 tablet (10 mg total) by mouth every 6 (six) hours as needed (Nausea or vomiting). 30 tablet 6   Current  Facility-Administered Medications  Medication Dose Route Frequency Provider Last Rate Last Dose  . 0.9 %  sodium chloride infusion  500 mL Intravenous Continuous Milus Banister, MD        REVIEW OF SYSTEMS:    10 Point review of Systems was done is negative except as noted above.  PHYSICAL EXAMINATION: ECOG PERFORMANCE STATUS: 1 - Symptomatic but completely ambulatory  . Vitals:   02/18/17 0907  BP: 124/66  Pulse: 97  Resp: 17  Temp: 97.7 F (36.5 C)  SpO2: 99%   Filed Weights   02/18/17 0907  Weight: 176 lb 12.8 oz (80.2 kg)   .Body mass index is 24.66 kg/m.  GENERAL:alert, in no acute distress and comfortable SKIN: no acute rashes, no significant lesions EYES: conjunctiva are pink and non-injected, sclera anicteric OROPHARYNX: MMM, no exudates, no oropharyngeal erythema or ulceration NECK: supple, no JVD LYMPH:  (+) 2 palpable lymphadenopathy in the cervical left lower neck and no obvious lymphadenopathy in axillary or inguinal regions that I could appreciate LUNGS: clear to auscultation b/l with normal respiratory effort HEART: regular rate & rhythm ABDOMEN:  normoactive bowel sounds , non tender, not distended. Borderline palpable splenomegaly, no overt hepatomegaly.  Extremity: no pedal edema PSYCH: alert & oriented x 3 with fluent speech NEURO: no focal motor/sensory deficits  LABORATORY DATA:  I have reviewed the data as listed  . CBC Latest Ref Rng & Units 02/19/2017 02/17/2017 02/13/2017  WBC 4.0 - 10.3 10e3/uL 4.3 6.5 10.2  Hemoglobin 13.0 - 17.1 g/dL 10.2(L) 11.5(L) 12.8(L)  Hematocrit 38.4 - 49.9 % 31.3(L) 34.6(L) 37.9(L)  Platelets 140 - 400 10e3/uL 66(L) 55(L) 49(L)    . CMP Latest Ref Rng & Units 02/19/2017 02/13/2017 01/26/2017  Glucose 70 - 140 mg/dl 335(H) 155(H) 200(H)  BUN 7.0 - 26.0 mg/dL 12.1 23.3 18.1  Creatinine 0.7 - 1.3 mg/dL 0.8 1.0 0.8  Sodium 136 - 145 mEq/L 134(L) 130(L) 136  Potassium 3.5 - 5.1 mEq/L 4.1 4.3 4.4  Chloride 101  - 111 mmol/L - - -  CO2 22 - 29 mEq/L 22 26 29   Calcium 8.4 - 10.4 mg/dL 8.5 9.3 9.7  Total Protein 6.4 - 8.3 g/dL 5.7(L) 6.6 6.6  Total Bilirubin 0.20 - 1.20 mg/dL 0.89 1.74(H) 1.02  Alkaline Phos 40 - 150 U/L 213(H) 291(H) 218(H)  AST 5 - 34 U/L 51(H) 90(H) 64(H)  ALT 0 - 55 U/L 46 80(H) 69(H)   Component     Latest Ref Rng & Units 01/26/2017  HIV Screen 4th Generation wRfx     Non Reactive Non Reactive  Hep C Virus Ab     0.0 - 0.9 s/co ratio <0.1  LDH     125 - 245 U/L 522 (H)  Sed Rate     0 - 30 mm/hr 6  Hep B Core Ab, Tot     Negative Negative  Hepatitis B Surface Ag     Negative Negative       RADIOGRAPHIC STUDIES: I have personally reviewed the radiological images as listed and agreed with the findings in the report. Ct Abdomen Pelvis W Contrast  Result Date: 01/21/2017 CLINICAL DATA:  Elevated liver function tests. History of back melanoma. EXAM: CT ABDOMEN AND PELVIS WITH CONTRAST TECHNIQUE: Multidetector CT imaging of the abdomen and pelvis was performed using the standard protocol following bolus administration of intravenous contrast. CONTRAST:  155mL ISOVUE-300 IOPAMIDOL (ISOVUE-300) INJECTION 61% COMPARISON:  10/09/2015 abdominal sonogram. FINDINGS: Lower chest: Mild patchy tree-in-bud opacities in the dependent lower lobes bilaterally. Coronary atherosclerosis. Hepatobiliary: Normal liver size. At least 4 scattered hypodense liver lesions, largest 1.6 cm in the segment 7 right liver lobe (series 2/ image 16) and 1.5 cm in the segment 2 left liver lobe (series 2/image 17). Normal gallbladder with no radiopaque cholelithiasis. No biliary ductal dilatation. Pancreas: Normal, with no mass or duct dilation. Spleen: Mildly enlarged spleen (craniocaudal splenic length 13.0 cm). Multiple (at least 10) hypodense masses scattered throughout the spleen, largest 2.8 cm (series 2/ image 29). Adrenals/Urinary Tract: Normal adrenals. No hydronephrosis. No renal masses. Normal  bladder. Stomach/Bowel: Grossly normal stomach. Normal caliber small bowel with no small bowel wall thickening. Normal appendix. Normal large bowel with no diverticulosis, large bowel wall thickening or pericolonic fat stranding. Vascular/Lymphatic: Atherosclerotic nonaneurysmal abdominal aorta. Patent portal, splenic, hepatic and renal veins. Gastrohepatic ligament lymphadenopathy measuring up to 1.6 cm (series 2/ image 20). Enlarged 2.3 cm portacaval node (series 2/ image 30). Multiple enlarged mesenteric lymph nodes, largest 3.4 cm in the right mesentery (series 2/ image 49). Right retrocrural adenopathy measuring up to the 2.1 cm (series 2/ image 29). Multiple enlarged left para-aortic nodes, largest 2.1 cm (series 2/ image 39). Left external iliac lymphadenopathy measuring up to 1.2 cm (series 2/ image 73). Reproductive: Top-normal size prostate with nonspecific internal prostatic calcifications. Other: No pneumoperitoneum. No focal fluid collection. Small volume free fluid in the deep pelvis. Musculoskeletal: No aggressive appearing focal osseous lesions. Mild thoracolumbar spondylosis. IMPRESSION: 1. Extensive retroperitoneal, mesenteric and left pelvic lymphadenopathy. Splenomegaly with numerous low-attenuation splenic masses. Several small low-attenuation liver masses. The leading consideration is lymphoma, although metastatic disease is on the differential given the history of melanoma. 2. Mild patchy tree-in-bud opacities in the dependent lung bases bilaterally, most compatible with a nonspecific mild infectious or inflammatory bronchiolitis, with the differential including aspiration. 3.  Aortic Atherosclerosis (ICD10-I70.0).  Coronary atherosclerosis. Electronically Signed   By: Ilona Sorrel M.D.   On: 01/21/2017 11:42   Nm Pet Image Initial (pi) Skull Base To Thigh  Result Date: 01/29/2017 CLINICAL DATA:  Initial treatment strategy for lymphoma. EXAM: NUCLEAR MEDICINE PET SKULL BASE TO THIGH  TECHNIQUE: 9.2 mCi F-18 FDG was injected intravenously. Full-ring PET imaging was performed from the skull base to thigh after the radiotracer. CT data was obtained and used for attenuation correction and anatomic localization. FASTING BLOOD GLUCOSE:  Value: 150 mg/dl COMPARISON:  None. FINDINGS: NECK: Bilateral hypermetabolic cervical lymph nodes identified. Right level-II lymph node measures 5 mm and has an SUV max equal to 6.7. 9 mm right supraclavicular lymph node is identified within  SUV max equal to 11.18. Left supraclavicular lymph node measures 6 mm and has an SUV max equal to 8.85. CHEST: Extensive mediastinal and bilateral hilar adenopathy identified. There is also bilateral internal mammary lymph node chain hypermetabolic adenopathy. Nodal mass within the pre-vascular region of the anterior mediastinum measures 7 cm and has an SUV max equal to 28.0. Left paratracheal lymph node measures 1.5 cm and has an SUV max equal to 23.68. 1.3 cm sub- carinal lymph node is identified with an SUV max equal to 14.6. No pleural effusion.  No hypermetabolic pulmonary nodules. ABDOMEN/PELVIS: No focal areas of increased uptake within the liver. The spleen measures 16.9 x 7.0 x 12.8 cm (volume = 790 cm^3). Multiple hypermetabolic splenic lesions identified. Within the anterior spleen there is a 1.9 cm hypermetabolic lesion with an SUV max equal to 11.6. Extensive hypermetabolic adenopathy within the abdomen. Ileocolic mesenteric nodal mass measures 5.1 cm and has an SUV max equal to 16.6. Left periaortic nodal mass measures 5.9 cm and has an SUV max equal to 24.76. Retrocrural node measures 2.1 cm and has an SUV max equal to 19.03. Bilateral hypermetabolic iliac nodes are identified. Measures 1.3 cm and has an SUV max equal to 21.2. Right external iliac node measures 1.2 cm and has an SUV max equal to 14.5. SKELETON: Extensive hypermetabolic tumor is identified throughout the axial and appendicular skeleton. Hypermetabolic  lesion within the proximal right humerus has an SUV max equal to 10.77. Hypermetabolic lesion within the right iliac bone has an SUV max equal to 11.25. Within the proximal right femur there is a intensely hypermetabolic lesion with an SUV max equal to 12.32. FDG uptake within the T11 vertebra has an SUV max equal to 14.5. Within the sternum hypermetabolic tumor has an SUV max equal to 10.95. IMPRESSION: 1. Examination is positive for extensive hypermetabolic adenopathy within the neck, chest, abdomen and pelvis. 2. Splenomegaly. Multifocal hypermetabolic splenic lesions are noted. 3. Extensive hypermetabolic tumor involvement of the axial and appendicular skeleton. Electronically Signed   By: Kerby Moors M.D.   On: 01/29/2017 14:55   Ir US Guide Vasc Access Right  Result Date: 02/17/2017 INDICATION: 61 year old male with a past history of melanoma and now matured T-cell lymphoma. He requires durable venous access for chemotherapy. EXAM: IMPLANTED PORT A CATH PLACEMENT WITH ULTRASOUND AND FLUOROSCOPIC GUIDANCE MEDICATIONS: Clindamycin 600 mg IV; The antibiotic was administered within an appropriate time interval prior to skin puncture. ANESTHESIA/SEDATION: Versed 2 mg IV; Fentanyl 100 mcg IV; Moderate Sedation Time:  20 minutes The patient was continuously monitored during the procedure by the interventional radiology nurse under my direct supervision. FLUOROSCOPY TIME:  0 minutes, 12 seconds (2 mGy) COMPLICATIONS: None immediate. PROCEDURE: The right neck and chest was prepped with chlorhexidine, and draped in the usual sterile fashion using maximum barrier technique (cap and mask, sterile gown, sterile gloves, large sterile sheet, hand hygiene and cutaneous antiseptic). Local anesthesia was attained by infiltration with 1% lidocaine with epinephrine. Ultrasound demonstrated patency of the right internal jugular vein, and this was documented with an image. Under Joseph-time ultrasound guidance, this vein was  accessed with a 21 gauge micropuncture needle and image documentation was performed. A small dermatotomy was made at the access site with an 11 scalpel. A 0.018" wire was advanced into the SVC and the access needle exchanged for a 12F micropuncture vascular sheath. The 0.018" wire was then removed and a 0.035" wire advanced into the IVC. An appropriate location for the subcutaneous reservoir  was selected below the clavicle and an incision was made through the skin and underlying soft tissues. The subcutaneous tissues were then dissected using a combination of blunt and sharp surgical technique and a pocket was formed. A single lumen power injectable portacatheter was then tunneled through the subcutaneous tissues from the pocket to the dermatotomy and the port reservoir placed within the subcutaneous pocket. The venous access site was then serially dilated and a peel away vascular sheath placed over the wire. The wire was removed and the port catheter advanced into position under fluoroscopic guidance. The catheter tip is positioned in the cavoatrial junction. This was documented with a spot image. The portacatheter was then tested and found to flush and aspirate well. The port was flushed with saline followed by 100 units/mL heparinized saline. The pocket was then closed in two layers using first subdermal inverted interrupted absorbable sutures followed by a running subcuticular suture. The epidermis was then sealed with Dermabond. The dermatotomy at the venous access site was also closed with a single inverted subdermal suture and the epidermis sealed with Dermabond. IMPRESSION: Successful placement of a right IJ approach Power Port with ultrasound and fluoroscopic guidance. The catheter is ready for use. Signed, Criselda Peaches, MD [facility] Electronically Signed   By: Jacqulynn Cadet M.D.   On: 02/17/2017 13:06   Ir Fluoro Guide Port Insertion  Right  Result Date: 02/17/2017 INDICATION: 61 year old male with a past history of melanoma and now matured T-cell lymphoma. He requires durable venous access for chemotherapy. EXAM: IMPLANTED PORT A CATH PLACEMENT WITH ULTRASOUND AND FLUOROSCOPIC GUIDANCE MEDICATIONS: Clindamycin 600 mg IV; The antibiotic was administered within an appropriate time interval prior to skin puncture. ANESTHESIA/SEDATION: Versed 2 mg IV; Fentanyl 100 mcg IV; Moderate Sedation Time:  20 minutes The patient was continuously monitored during the procedure by the interventional radiology nurse under my direct supervision. FLUOROSCOPY TIME:  0 minutes, 12 seconds (2 mGy) COMPLICATIONS: None immediate. PROCEDURE: The right neck and chest was prepped with chlorhexidine, and draped in the usual sterile fashion using maximum barrier technique (cap and mask, sterile gown, sterile gloves, large sterile sheet, hand hygiene and cutaneous antiseptic). Local anesthesia was attained by infiltration with 1% lidocaine with epinephrine. Ultrasound demonstrated patency of the right internal jugular vein, and this was documented with an image. Under Joseph-time ultrasound guidance, this vein was accessed with a 21 gauge micropuncture needle and image documentation was performed. A small dermatotomy was made at the access site with an 11 scalpel. A 0.018" wire was advanced into the SVC and the access needle exchanged for a 32F micropuncture vascular sheath. The 0.018" wire was then removed and a 0.035" wire advanced into the IVC. An appropriate location for the subcutaneous reservoir was selected below the clavicle and an incision was made through the skin and underlying soft tissues. The subcutaneous tissues were then dissected using a combination of blunt and sharp surgical technique and a pocket was formed. A single lumen power injectable portacatheter was then tunneled through the subcutaneous tissues from the pocket to the dermatotomy and the port  reservoir placed within the subcutaneous pocket. The venous access site was then serially dilated and a peel away vascular sheath placed over the wire. The wire was removed and the port catheter advanced into position under fluoroscopic guidance. The catheter tip is positioned in the cavoatrial junction. This was documented with a spot image. The portacatheter was then tested and found to flush and  aspirate well. The port was flushed with saline followed by 100 units/mL heparinized saline. The pocket was then closed in two layers using first subdermal inverted interrupted absorbable sutures followed by a running subcuticular suture. The epidermis was then sealed with Dermabond. The dermatotomy at the venous access site was also closed with a single inverted subdermal suture and the epidermis sealed with Dermabond. IMPRESSION: Successful placement of a right IJ approach Power Port with ultrasound and fluoroscopic guidance. The catheter is ready for use. Signed, Criselda Peaches, MD [facility] Radiology Electronically Signed   By: Jacqulynn Cadet M.D.   On: 02/17/2017 13:06    ASSESSMENT & PLAN:    SUYASH AMORY is a 61 y.o. 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 Splenomegaly with numerous low-attenuation splenic masses. Several small low-attenuation liver masses. The leading consideration is lymphoma, although metastatic disease with unknown primary in on the differential.  #2 Clinical evidence of left lower anterior cervical Lymphadenopathy  #3 Acute on chronic Thrombocytopenia. Patient has has some chronic thrombocytopenia 85-125k for a few years. Platelets today are down to 56k in the setting of newly diagnosed lymphoma Could be from hypersplenism. -platelets at 55k as of 02/17/2017  #4 abnormal liver function  tests likely related to liver involvement by tumor.  Plan -reviewed PET/CT and pathology results in details with the patient and his wife. Starting CHOEP from today. Will get D1 and D2 (thursday and Friday). Holding D3 f (etoposide) first cycle Referral to nutritional therapy Neulasta on Saturday RTC to Clinic with Dr Irene Limbo 02/27/2017 with labs for toxicity check Plz schedule C2 of CHOEP (3 days) in 3weeks.   #2 History of Melanoma, 2009 -Followed by Dr. Allyson Sabal for continued skin screening   #3 Patient Active Problem List   Diagnosis Date Noted  . Counseling regarding advanced care planning and goals of care 02/12/2017  . Mature T/NK-cell lymphomas, unspecified, lymph nodes of multiple sites ([place]) 02/10/2017  . Cough variant asthma vs UACS 01/27/2017  . Dyslipidemia 08/05/2016  . Mild asthma 08/05/2016  . DJD (degenerative joint disease) 08/05/2016  . HLD (hyperlipidemia) 07/27/2016  . GERD (gastroesophageal reflux disease) 07/27/2016  . Gout 07/27/2016  . Discomfort in chest 07/27/2016  . OSA (obstructive sleep apnea) 09/19/2013  . Thrombocytopenia ([place]) 04/14/2011  . SCHATZKI'S RING 08/21/2009    All of the patients questions were answered with apparent satisfaction. The patient knows to call the clinic with any problems, questions or concerns.  I spent 20 minutes counseling the patient face to face. The total time spent in the appointment was 25 minutes and more than 50% was on counseling and direct patient cares.    Sullivan Lone MD [place] AAHIVMS [facility] Hematology/Oncology Physician [facility]  (Office):       [phone] (Work cell):  [phone] (Fax):           [phone]  02/18/2017 9:54 AM   This document serves as a record of services personally performed by Sullivan Lone, MD. It was created on her behalf by Alean Rinne, a trained medical scribe. The creation of this record is based on the scribe's personal observations and the provider's  statements to them. This document has been checked and approved by the attending provider.

## 2017-02-17 NOTE — Procedures (Signed)
Interventional Radiology Procedure Note  Procedure: Placement of a right IJ approach single lumen PowerPort.  Tip is positioned at the superior cavoatrial junction and catheter is ready for immediate use.  Complications: No immediate Recommendations:  - Ok to shower tomorrow - Do not submerge for 7 days - Routine line care   Signed,  Octave Montrose K. Braden Deloach, MD   

## 2017-02-17 NOTE — Consult Note (Signed)
Chief Complaint: Patient was seen in consultation today for  Port-A-Cath placement  Referring Physician(s): Brunetta Genera  Supervising Physician: Jacqulynn Cadet  Patient Status: [facility] - Out-pt  History of Present Illness: Joseph Powell is a 61 y.o. male with past medical history significant for melanoma excision approximately 9 years ago and now with newly diagnosed T-cell lymphoma (NHL). He presents today for Port-A-Cath placement for chemotherapy.   Past Medical History:  Diagnosis Date  . Allergic rhinitis   . Asthma    slight  . Bell's palsy   . GERD (gastroesophageal reflux disease)   . Gout   . Hypercholesteremia   . Melanoma ([place])   . Osteoarthritis   . Sleep apnea    Resolving  . Thrombocytopenia ([place])   . Tubular adenoma     Past Surgical History:  Procedure Laterality Date  . KNEE SURGERY  1993  . MELANOMA EXCISION    . NASAL RECONSTRUCTION    . VASECTOMY  1999    Allergies: Penicillins  Medications: Prior to Admission medications   Medication Sig Start Date End Date Taking? Authorizing Provider  albuterol (PROVENTIL HFA;VENTOLIN HFA) 108 (90 Base) MCG/ACT inhaler Inhale 1-2 puffs into the lungs every 6 (six) hours as needed for wheezing or shortness of breath.    [provider]  allopurinol (ZYLOPRIM) 100 MG tablet Take 100 mg by mouth 2 (two) times daily.    [provider]  aspirin EC 81 MG tablet Take 1 tablet (81 mg total) by mouth daily. Patient not taking: Reported on 01/27/2017 07/28/16   Geradine Girt, DO  benzonatate (TESSALON) 200 MG capsule Take 1 capsule (200 mg total) by mouth 3 (three) times daily as needed for cough. 01/27/17   Tanda Rockers, MD  co-enzyme Q-10 30 MG capsule Take 30 mg by mouth daily.    [provider]  dronabinol (MARINOL) 5 MG capsule Take 1 capsule (5 mg total) by mouth 2 (two) times daily before a meal. 02/13/17   Brunetta Genera, MD  glucosamine-chondroitin 500-400  MG tablet Take 1 tablet by mouth 2 (two) times daily.     [provider]  lidocaine-prilocaine (EMLA) cream Apply to affected area once 02/13/17   Brunetta Genera, MD  LORazepam (ATIVAN) 0.5 MG tablet Take 1 tablet (0.5 mg total) by mouth every 8 (eight) hours as needed for anxiety. 02/13/17   Brunetta Genera, MD  mometasone-formoterol [facility]) 100-5 MCG/ACT AERO Inhale 2 puffs into the lungs 2 (two) times daily. 01/27/17   Tanda Rockers, MD  nabumetone (RELAFEN) 750 MG tablet Take 1 tablet by mouth as needed. 05/07/16   [provider]  ondansetron (ZOFRAN) 8 MG tablet Take 1 tablet (8 mg total) by mouth 2 (two) times daily as needed for refractory nausea / vomiting. Start on day 3 after each chemotherapy 02/13/17   Brunetta Genera, MD  pantoprazole (PROTONIX) 40 MG tablet Take 40 mg by mouth daily.     [provider]  pravastatin (PRAVACHOL) 40 MG tablet Take 40 mg by mouth daily.      [provider]  predniSONE (DELTASONE) 20 MG tablet Take 3 tablets (60 mg total) by mouth daily. Take on days 1-5 of chemotherapy. 02/13/17   Brunetta Genera, MD  prochlorperazine (COMPAZINE) 10 MG tablet Take 1 tablet (10 mg total) by mouth every 6 (six) hours as needed (Nausea or vomiting). 02/13/17   Brunetta Genera, MD  Turmeric Curcumin 500 MG  CAPS Take 1 tablet by mouth daily. 07/30/16   [provider]     Family History  Problem Relation Age of Onset  . Diabetes Mother   . Asthma Mother   . Gout Brother   . Colon cancer Paternal Uncle   . Esophageal cancer Neg Hx   . Stomach cancer Neg Hx     Social History   Social History  . Marital status: Married    Spouse name: N/A  . Number of children: N/A  . Years of education: N/A   Occupational History  . Risk analyst    Social History Main Topics  . Smoking status: Never Smoker  . Smokeless tobacco: Never Used  . Alcohol use 1.0 oz/week    2 Standard drinks or equivalent  per week     Comment: socially  . Drug use: No  . Sexual activity: Not on file   Other Topics Concern  . Not on file   Social History Narrative  . No narrative on file      Review of Systems: currently denies headache, chest pain, dyspnea, cough, nausea, vomiting or bleeding. He has had recent fevers, night sweats,intermittent abdominal/back discomfort and weight loss. He is anxious.  Vital Signs: BP 127/62 (BP Location: Right Arm)   Pulse (!) 107   Temp 98.2 F (36.8 C) (Oral)   Resp 18   SpO2 100%   Physical Exam awake, alert. Chest clear to auscultation bilaterally. Heart with tachycardia but regular rhythm. Abdomen soft, positive bowel sounds, nontender. No lower extremity edema. Prior lymph node biopsy site left neck clean and dry.  Imaging: Ct Abdomen Pelvis W Contrast  Result Date: 01/21/2017 CLINICAL DATA:  Elevated liver function tests. History of back melanoma. EXAM: CT ABDOMEN AND PELVIS WITH CONTRAST TECHNIQUE: Multidetector CT imaging of the abdomen and pelvis was performed using the standard protocol following bolus administration of intravenous contrast. CONTRAST:  182mL ISOVUE-300 IOPAMIDOL (ISOVUE-300) INJECTION 61% COMPARISON:  10/09/2015 abdominal sonogram. FINDINGS: Lower chest: Mild patchy tree-in-bud opacities in the dependent lower lobes bilaterally. Coronary atherosclerosis. Hepatobiliary: Normal liver size. At least 4 scattered hypodense liver lesions, largest 1.6 cm in the segment 7 right liver lobe (series 2/ image 16) and 1.5 cm in the segment 2 left liver lobe (series 2/image 17). Normal gallbladder with no radiopaque cholelithiasis. No biliary ductal dilatation. Pancreas: Normal, with no mass or duct dilation. Spleen: Mildly enlarged spleen (craniocaudal splenic length 13.0 cm). Multiple (at least 10) hypodense masses scattered throughout the spleen, largest 2.8 cm (series 2/ image 29). Adrenals/Urinary Tract: Normal adrenals. No hydronephrosis. No renal  masses. Normal bladder. Stomach/Bowel: Grossly normal stomach. Normal caliber small bowel with no small bowel wall thickening. Normal appendix. Normal large bowel with no diverticulosis, large bowel wall thickening or pericolonic fat stranding. Vascular/Lymphatic: Atherosclerotic nonaneurysmal abdominal aorta. Patent portal, splenic, hepatic and renal veins. Gastrohepatic ligament lymphadenopathy measuring up to 1.6 cm (series 2/ image 20). Enlarged 2.3 cm portacaval node (series 2/ image 30). Multiple enlarged mesenteric lymph nodes, largest 3.4 cm in the right mesentery (series 2/ image 49). Right retrocrural adenopathy measuring up to the 2.1 cm (series 2/ image 29). Multiple enlarged left para-aortic nodes, largest 2.1 cm (series 2/ image 39). Left external iliac lymphadenopathy measuring up to 1.2 cm (series 2/ image 73). Reproductive: Top-normal size prostate with nonspecific internal prostatic calcifications. Other: No pneumoperitoneum. No focal fluid collection. Small volume free fluid in the deep pelvis. Musculoskeletal: No aggressive appearing focal osseous lesions. Mild thoracolumbar spondylosis.  IMPRESSION: 1. Extensive retroperitoneal, mesenteric and left pelvic lymphadenopathy. Splenomegaly with numerous low-attenuation splenic masses. Several small low-attenuation liver masses. The leading consideration is lymphoma, although metastatic disease is on the differential given the history of melanoma. 2. Mild patchy tree-in-bud opacities in the dependent lung bases bilaterally, most compatible with a nonspecific mild infectious or inflammatory bronchiolitis, with the differential including aspiration. 3.  Aortic Atherosclerosis (ICD10-I70.0).  Coronary atherosclerosis. Electronically Signed   By: Ilona Sorrel M.D.   On: 01/21/2017 11:42   Nm Pet Image Initial (pi) Skull Base To Thigh  Result Date: 01/29/2017 CLINICAL DATA:  Initial treatment strategy for lymphoma. EXAM: NUCLEAR MEDICINE PET SKULL BASE  TO THIGH TECHNIQUE: 9.2 mCi F-18 FDG was injected intravenously. Full-ring PET imaging was performed from the skull base to thigh after the radiotracer. CT data was obtained and used for attenuation correction and anatomic localization. FASTING BLOOD GLUCOSE:  Value: 150 mg/dl COMPARISON:  None. FINDINGS: NECK: Bilateral hypermetabolic cervical lymph nodes identified. Right level-II lymph node measures 5 mm and has an SUV max equal to 6.7. 9 mm right supraclavicular lymph node is identified within SUV max equal to 11.18. Left supraclavicular lymph node measures 6 mm and has an SUV max equal to 8.85. CHEST: Extensive mediastinal and bilateral hilar adenopathy identified. There is also bilateral internal mammary lymph node chain hypermetabolic adenopathy. Nodal mass within the pre-vascular region of the anterior mediastinum measures 7 cm and has an SUV max equal to 28.0. Left paratracheal lymph node measures 1.5 cm and has an SUV max equal to 23.68. 1.3 cm sub- carinal lymph node is identified with an SUV max equal to 14.6. No pleural effusion.  No hypermetabolic pulmonary nodules. ABDOMEN/PELVIS: No focal areas of increased uptake within the liver. The spleen measures 16.9 x 7.0 x 12.8 cm (volume = 790 cm^3). Multiple hypermetabolic splenic lesions identified. Within the anterior spleen there is a 1.9 cm hypermetabolic lesion with an SUV max equal to 11.6. Extensive hypermetabolic adenopathy within the abdomen. Ileocolic mesenteric nodal mass measures 5.1 cm and has an SUV max equal to 16.6. Left periaortic nodal mass measures 5.9 cm and has an SUV max equal to 24.76. Retrocrural node measures 2.1 cm and has an SUV max equal to 19.03. Bilateral hypermetabolic iliac nodes are identified. Measures 1.3 cm and has an SUV max equal to 21.2. Right external iliac node measures 1.2 cm and has an SUV max equal to 14.5. SKELETON: Extensive hypermetabolic tumor is identified throughout the axial and appendicular skeleton.  Hypermetabolic lesion within the proximal right humerus has an SUV max equal to 10.77. Hypermetabolic lesion within the right iliac bone has an SUV max equal to 11.25. Within the proximal right femur there is a intensely hypermetabolic lesion with an SUV max equal to 12.32. FDG uptake within the T11 vertebra has an SUV max equal to 14.5. Within the sternum hypermetabolic tumor has an SUV max equal to 10.95. IMPRESSION: 1. Examination is positive for extensive hypermetabolic adenopathy within the neck, chest, abdomen and pelvis. 2. Splenomegaly. Multifocal hypermetabolic splenic lesions are noted. 3. Extensive hypermetabolic tumor involvement of the axial and appendicular skeleton. Electronically Signed   By: Kerby Moors M.D.   On: 01/29/2017 14:55    Labs:  CBC:  Recent Labs  07/27/16 1717 01/20/17 1138 01/26/17 1332 02/13/17 1531  WBC 6.9 15.7* 9.2 10.2  HGB 15.0 13.2 12.6* 12.8*  HCT 43.9 39.5 37.9* 37.9*  PLT 88* 125* 56* 49*    COAGS: No results for  input(s): INR, APTT in the last 8760 hours.  BMP:  Recent Labs  07/27/16 1717 01/20/17 1138 01/26/17 1332 02/13/17 1531  NA 138 138 136 130*  K 3.9 3.9 4.4 4.3  CL 102 99*  --   --   CO2 24 28 29 26   GLUCOSE 187* 138* 200* 155*  BUN 7 12 18.1 23.3  CALCIUM 9.5 9.3 9.7 9.3  CREATININE 0.90 0.76 0.8 1.0  GFRNONAA >60 >60  --   --   GFRAA >60 >60  --   --     LIVER FUNCTION TESTS:  Recent Labs  01/20/17 1138 01/26/17 1332 02/13/17 1531  BILITOT 1.0 1.02 1.74*  AST 44* 64* 90*  ALT 42 69* 80*  ALKPHOS 138* 218* 291*  PROT 6.4* 6.6 6.6  ALBUMIN 4.0 3.5 3.5    TUMOR MARKERS: No results for input(s): AFPTM, CEA, CA199, CHROMGRNA in the last 8760 hours.  Assessment and Plan: 60 y.o. male with past medical history significant for melanoma excision approximately 9 years ago and now with newly diagnosed T-cell lymphoma (NHL). He presents today for Port-A-Cath placement for chemotherapy.Risks and benefits discussed  with the patient/spouse including, but not limited to bleeding, infection, pneumothorax, or fibrin sheath development and need for additional procedures.All of the patient's questions were answered, patient is agreeable to proceed.Consent signed and in chart. LABS PENDING.     Thank you for this interesting consult.  I greatly enjoyed meeting MARLON SULEIMAN and look forward to participating in their care.  A copy of this report was sent to the requesting provider on this date.  Electronically Signed: D. Rowe Robert, PA-C 02/17/2017, 9:34 AM   I spent a total of  25 minutes   in face to face in clinical consultation, greater than 50% of which was counseling/coordinating care for Port-A-Cath placement

## 2017-02-18 ENCOUNTER — Ambulatory Visit (HOSPITAL_BASED_OUTPATIENT_CLINIC_OR_DEPARTMENT_OTHER): Payer: BLUE CROSS/BLUE SHIELD | Admitting: Hematology

## 2017-02-18 ENCOUNTER — Telehealth: Payer: Self-pay | Admitting: Hematology

## 2017-02-18 ENCOUNTER — Other Ambulatory Visit: Payer: BLUE CROSS/BLUE SHIELD

## 2017-02-18 ENCOUNTER — Encounter: Payer: Self-pay | Admitting: *Deleted

## 2017-02-18 ENCOUNTER — Encounter: Payer: Self-pay | Admitting: Hematology

## 2017-02-18 ENCOUNTER — Ambulatory Visit: Payer: BLUE CROSS/BLUE SHIELD

## 2017-02-18 VITALS — BP 124/66 | HR 97 | Temp 97.7°F | Resp 17 | Ht 71.0 in | Wt 176.8 lb

## 2017-02-18 DIAGNOSIS — D696 Thrombocytopenia, unspecified: Secondary | ICD-10-CM

## 2017-02-18 DIAGNOSIS — R591 Generalized enlarged lymph nodes: Secondary | ICD-10-CM

## 2017-02-18 DIAGNOSIS — C8440 Peripheral T-cell lymphoma, not classified, unspecified site: Secondary | ICD-10-CM

## 2017-02-18 DIAGNOSIS — C8442 Peripheral T-cell lymphoma, not classified, intrathoracic lymph nodes: Secondary | ICD-10-CM

## 2017-02-18 DIAGNOSIS — Z85820 Personal history of malignant melanoma of skin: Secondary | ICD-10-CM

## 2017-02-18 DIAGNOSIS — R7989 Other specified abnormal findings of blood chemistry: Secondary | ICD-10-CM

## 2017-02-18 DIAGNOSIS — E440 Moderate protein-calorie malnutrition: Secondary | ICD-10-CM

## 2017-02-18 NOTE — Patient Instructions (Signed)
Thank you for choosing [facility] to provide your oncology and hematology care.  To afford each patient quality time with our providers, please arrive 30 minutes before your scheduled appointment time.  If you arrive late for your appointment, you may be asked to reschedule.  We strive to give you quality time with our providers, and arriving late affects you and other patients whose appointments are after yours.   If you are a no show for multiple scheduled visits, you may be dismissed from the clinic at the providers discretion.    Again, thank you for choosing [facility], our hope is that these requests will decrease the amount of time that you wait before being seen by our physicians.  ______________________________________________________________________  Should you have questions after your visit to the [facility], please contact our office at [phone] between the hours of 8:30 and 4:30 p.m.    Voicemails left after 4:30p.m will not be returned until the following business day.    For prescription refill requests, please have your pharmacy contact us directly.  Please also try to allow 48 hours for prescription requests.    Please contact the scheduling department for questions regarding scheduling.  For scheduling of procedures such as PET scans, CT scans, MRI, Ultrasound, etc please contact central scheduling at [phone].    Resources For Cancer Patients and Caregivers:   Oncolink.org:  A wonderful resource for patients and healthcare providers for information regarding your disease, ways to tract your treatment, what to expect, etc.     American Cancer Society:  800-227-2345  Can help patients locate various types of support and financial assistance  Cancer Care: 1-800-813-HOPE (4673) Provides financial assistance, online support groups, medication/co-pay assistance.    [place] DSS:  336-641-3447 Where to apply for food  stamps, Medicaid, and utility assistance  Medicare Rights Center: 800-333-4114 Helps people with Medicare understand their rights and benefits, navigate the Medicare system, and secure the quality healthcare they deserve  SCAT: 336-333-6589 [employer]'s shared-ride transportation service for eligible riders who have a disability that prevents them from riding the fixed route bus.    For additional information on assistance programs please contact our social worker:   Grier Hock/Abigail Elmore:  [phone]            

## 2017-02-18 NOTE — Telephone Encounter (Signed)
Gave avs and calendar for October and November  °

## 2017-02-19 ENCOUNTER — Other Ambulatory Visit: Payer: BLUE CROSS/BLUE SHIELD

## 2017-02-19 ENCOUNTER — Ambulatory Visit (HOSPITAL_BASED_OUTPATIENT_CLINIC_OR_DEPARTMENT_OTHER): Payer: BLUE CROSS/BLUE SHIELD

## 2017-02-19 ENCOUNTER — Telehealth: Payer: Self-pay | Admitting: *Deleted

## 2017-02-19 VITALS — BP 121/58 | HR 102 | Temp 98.5°F | Resp 20

## 2017-02-19 DIAGNOSIS — Z7189 Other specified counseling: Secondary | ICD-10-CM

## 2017-02-19 DIAGNOSIS — C8440 Peripheral T-cell lymphoma, not classified, unspecified site: Secondary | ICD-10-CM | POA: Diagnosis not present

## 2017-02-19 DIAGNOSIS — C8498 Mature T/NK-cell lymphomas, unspecified, lymph nodes of multiple sites: Secondary | ICD-10-CM

## 2017-02-19 DIAGNOSIS — Z5111 Encounter for antineoplastic chemotherapy: Secondary | ICD-10-CM | POA: Diagnosis not present

## 2017-02-19 LAB — CBC WITH DIFFERENTIAL/PLATELET
BASO%: 1.2 % (ref 0.0–2.0)
Basophils Absolute: 0.1 10*3/uL (ref 0.0–0.1)
EOS%: 0 % (ref 0.0–7.0)
Eosinophils Absolute: 0 10*3/uL (ref 0.0–0.5)
HEMATOCRIT: 31.3 % — AB (ref 38.4–49.9)
HEMOGLOBIN: 10.2 g/dL — AB (ref 13.0–17.1)
LYMPH#: 0.8 10*3/uL — AB (ref 0.9–3.3)
LYMPH%: 19.7 % (ref 14.0–49.0)
MCH: 27.8 pg (ref 27.2–33.4)
MCHC: 32.7 g/dL (ref 32.0–36.0)
MCV: 85 fL (ref 79.3–98.0)
MONO#: 0.7 10*3/uL (ref 0.1–0.9)
MONO%: 17.3 % — ABNORMAL HIGH (ref 0.0–14.0)
NEUT%: 61.8 % (ref 39.0–75.0)
NEUTROS ABS: 2.7 10*3/uL (ref 1.5–6.5)
PLATELETS: 66 10*3/uL — AB (ref 140–400)
RBC: 3.67 10*6/uL — ABNORMAL LOW (ref 4.20–5.82)
RDW: 18.6 % — AB (ref 11.0–14.6)
WBC: 4.3 10*3/uL (ref 4.0–10.3)

## 2017-02-19 LAB — COMPREHENSIVE METABOLIC PANEL
ALBUMIN: 3 g/dL — AB (ref 3.5–5.0)
ALK PHOS: 213 U/L — AB (ref 40–150)
ALT: 46 U/L (ref 0–55)
ANION GAP: 12 meq/L — AB (ref 3–11)
AST: 51 U/L — AB (ref 5–34)
BILIRUBIN TOTAL: 0.89 mg/dL (ref 0.20–1.20)
BUN: 12.1 mg/dL (ref 7.0–26.0)
CALCIUM: 8.5 mg/dL (ref 8.4–10.4)
CO2: 22 mEq/L (ref 22–29)
CREATININE: 0.8 mg/dL (ref 0.7–1.3)
Chloride: 101 mEq/L (ref 98–109)
EGFR: >60 mL/min/{1.73_m2} (ref >60)
Glucose: 335 mg/dl — ABNORMAL HIGH (ref 70–140)
Potassium: 4.1 mEq/L (ref 3.5–5.1)
Sodium: 134 mEq/L — ABNORMAL LOW (ref 136–145)
TOTAL PROTEIN: 5.7 g/dL — AB (ref 6.4–8.3)

## 2017-02-19 LAB — TECHNOLOGIST REVIEW

## 2017-02-19 MED ORDER — DOXORUBICIN HCL CHEMO IV INJECTION 2 MG/ML
50.0000 mg/m2 | Freq: Once | INTRAVENOUS | Status: AC
  Administered 2017-02-19: 102 mg via INTRAVENOUS
  Filled 2017-02-19: qty 51

## 2017-02-19 MED ORDER — PALONOSETRON HCL INJECTION 0.25 MG/5ML
INTRAVENOUS | Status: AC
  Filled 2017-02-19: qty 5

## 2017-02-19 MED ORDER — VINCRISTINE SULFATE CHEMO INJECTION 1 MG/ML
2.0000 mg | Freq: Once | INTRAVENOUS | Status: AC
  Administered 2017-02-19: 2 mg via INTRAVENOUS
  Filled 2017-02-19: qty 2

## 2017-02-19 MED ORDER — SODIUM CHLORIDE 0.9 % IV SOLN
97.0000 mg/m2 | Freq: Once | INTRAVENOUS | Status: AC
  Administered 2017-02-19: 200 mg via INTRAVENOUS
  Filled 2017-02-19: qty 10

## 2017-02-19 MED ORDER — SODIUM CHLORIDE 0.9% FLUSH
10.0000 mL | INTRAVENOUS | Status: DC | PRN
  Administered 2017-02-19: 10 mL
  Filled 2017-02-19: qty 10

## 2017-02-19 MED ORDER — PALONOSETRON HCL INJECTION 0.25 MG/5ML
0.2500 mg | Freq: Once | INTRAVENOUS | Status: AC
Start: 2017-02-19 — End: 2017-02-19
  Administered 2017-02-19: 0.25 mg via INTRAVENOUS

## 2017-02-19 MED ORDER — HEPARIN SOD (PORK) LOCK FLUSH 100 UNIT/ML IV SOLN
500.0000 [IU] | Freq: Once | INTRAVENOUS | Status: AC | PRN
  Administered 2017-02-19: 500 [IU]
  Filled 2017-02-19: qty 5

## 2017-02-19 MED ORDER — SODIUM CHLORIDE 0.9 % IV SOLN
750.0000 mg/m2 | Freq: Once | INTRAVENOUS | Status: AC
  Administered 2017-02-19: 1540 mg via INTRAVENOUS
  Filled 2017-02-19: qty 77

## 2017-02-19 MED ORDER — SODIUM CHLORIDE 0.9 % IV SOLN
Freq: Once | INTRAVENOUS | Status: AC
  Administered 2017-02-19: 10:00:00 via INTRAVENOUS
  Filled 2017-02-19: qty 5

## 2017-02-19 MED ORDER — SODIUM CHLORIDE 0.9 % IV SOLN
Freq: Once | INTRAVENOUS | Status: AC
  Administered 2017-02-19: 10:00:00 via INTRAVENOUS

## 2017-02-19 NOTE — Telephone Encounter (Signed)
Per [name], referral made for consultation for stem cell transplant to Dr. Dellis Filbert at [facility].  Pt info faxed to [phone].  [facility] to contact patient when consult has been approved by insurance.

## 2017-02-19 NOTE — Progress Notes (Signed)
Dr Irene Limbo made aware of low platelets and gave order to proceed with treatment today.

## 2017-02-19 NOTE — Patient Instructions (Addendum)
[facility] Discharge Instructions for Patients Receiving Chemotherapy  Today you received the following chemotherapy agents:  Adriamycin, Vincristine, Cytoxan, and Etoposide.  To help prevent nausea and vomiting after your treatment, we encourage you to take your nausea medication as directed.   If you develop nausea and vomiting that is not controlled by your nausea medication, call the clinic.   BELOW ARE SYMPTOMS THAT SHOULD BE REPORTED IMMEDIATELY:  *FEVER GREATER THAN 100.5 F  *CHILLS WITH OR WITHOUT FEVER  NAUSEA AND VOMITING THAT IS NOT CONTROLLED WITH YOUR NAUSEA MEDICATION  *UNUSUAL SHORTNESS OF BREATH  *UNUSUAL BRUISING OR BLEEDING  TENDERNESS IN MOUTH AND THROAT WITH OR WITHOUT PRESENCE OF ULCERS  *URINARY PROBLEMS  *BOWEL PROBLEMS  UNUSUAL RASH Items with * indicate a potential emergency and should be followed up as soon as possible.  Feel free to call the clinic should you have any questions or concerns. The clinic phone number is [phone].  Please show the [facility] at check-in to the Emergency Department and triage nurse.  Doxorubicin injection What is this medicine? DOXORUBICIN (dox oh ROO bi sin) is a chemotherapy drug. It is used to treat many kinds of cancer like leukemia, lymphoma, neuroblastoma, sarcoma, and Wilms' tumor. It is also used to treat bladder cancer, breast cancer, lung cancer, ovarian cancer, stomach cancer, and thyroid cancer. This medicine may be used for other purposes; ask your health care provider or pharmacist if you have questions. COMMON BRAND NAME(S): Adriamycin, Adriamycin PFS, Adriamycin RDF, Rubex What should I tell my health care provider before I take this medicine? They need to know if you have any of these conditions: -heart disease -history of low blood counts caused by a medicine -liver disease -recent or ongoing radiation therapy -an unusual or allergic reaction to doxorubicin, other  chemotherapy agents, other medicines, foods, dyes, or preservatives -pregnant or trying to get pregnant -breast-feeding How should I use this medicine? This drug is given as an infusion into a vein. It is administered in a hospital or clinic by a specially trained health care professional. If you have pain, swelling, burning or any unusual feeling around the site of your injection, tell your health care professional right away. Talk to your pediatrician regarding the use of this medicine in children. Special care may be needed. Overdosage: If you think you have taken too much of this medicine contact a poison control center or emergency room at once. NOTE: This medicine is only for you. Do not share this medicine with others. What if I miss a dose? It is important not to miss your dose. Call your doctor or health care professional if you are unable to keep an appointment. What may interact with this medicine? This medicine may interact with the following medications: -6-mercaptopurine -paclitaxel -phenytoin -St. John's Wort -trastuzumab -verapamil This list may not describe all possible interactions. Give your health care provider a list of all the medicines, herbs, non-prescription drugs, or dietary supplements you use. Also tell them if you smoke, drink alcohol, or use illegal drugs. Some items may interact with your medicine. What should I watch for while using this medicine? This drug may make you feel generally unwell. This is not uncommon, as chemotherapy can affect healthy cells as well as cancer cells. Report any side effects. Continue your course of treatment even though you feel ill unless your doctor tells you to stop. There is a maximum amount of this medicine you should receive throughout your life. The  amount depends on the medical condition being treated and your overall health. Your doctor will watch how much of this medicine you receive in your lifetime. Tell your doctor if you  have taken this medicine before. You may need blood work done while you are taking this medicine. Your urine may turn red for a few days after your dose. This is not blood. If your urine is dark or brown, call your doctor. In some cases, you may be given additional medicines to help with side effects. Follow all directions for their use. Call your doctor or health care professional for advice if you get a fever, chills or sore throat, or other symptoms of a cold or flu. Do not treat yourself. This drug decreases your body's ability to fight infections. Try to avoid being around people who are sick. This medicine may increase your risk to bruise or bleed. Call your doctor or health care professional if you notice any unusual bleeding. Talk to your doctor about your risk of cancer. You may be more at risk for certain types of cancers if you take this medicine. Do not become pregnant while taking this medicine or for 6 months after stopping it. Women should inform their doctor if they wish to become pregnant or think they might be pregnant. Men should not father a child while taking this medicine and for 6 months after stopping it. There is a potential for serious side effects to an unborn child. Talk to your health care professional or pharmacist for more information. Do not breast-feed an infant while taking this medicine. This medicine has caused ovarian failure in some women and reduced sperm counts in some men This medicine may interfere with the ability to have a child. Talk with your doctor or health care professional if you are concerned about your fertility. What side effects may I notice from receiving this medicine? Side effects that you should report to your doctor or health care professional as soon as possible: -allergic reactions like skin rash, itching or hives, swelling of the face, lips, or tongue -breathing problems -chest pain -fast or irregular heartbeat -low blood counts - this  medicine may decrease the number of white blood cells, red blood cells and platelets. You may be at increased risk for infections and bleeding. -pain, redness, or irritation at site where injected -signs of infection - fever or chills, cough, sore throat, pain or difficulty passing urine -signs of decreased platelets or bleeding - bruising, pinpoint red spots on the skin, black, tarry stools, blood in the urine -swelling of the ankles, feet, hands -tiredness -weakness Side effects that usually do not require medical attention (report to your doctor or health care professional if they continue or are bothersome): -diarrhea -hair loss -mouth sores -nail discoloration or damage -nausea -red colored urine -vomiting This list may not describe all possible side effects. Call your doctor for medical advice about side effects. You may report side effects to FDA at 1-800-FDA-1088. Where should I keep my medicine? This drug is given in a hospital or clinic and will not be stored at home. NOTE: This sheet is a summary. It may not cover all possible information. If you have questions about this medicine, talk to your doctor, pharmacist, or health care provider.  2018 Elsevier/Gold Standard (2015-06-18 11:28:51)  Vincristine injection What is this medicine? VINCRISTINE (vin KRIS teen) is a chemotherapy drug. It slows the growth of cancer cells. This medicine is used to treat many types of cancer like  Hodgkin's disease, leukemia, non-Hodgkin's lymphoma, neuroblastoma (brain cancer), rhabdomyosarcoma, and Wilms' tumor. This medicine may be used for other purposes; ask your health care provider or pharmacist if you have questions. COMMON BRAND NAME(S): Oncovin, Vincasar PFS What should I tell my health care provider before I take this medicine? They need to know if you have any of these conditions: -blood disorders -gout -infection (especially chickenpox, cold sores, or herpes) -kidney  disease -liver disease -lung disease -nervous system disease like Charcot-Marie-Tooth (CMT) -recent or ongoing radiation therapy -an unusual or allergic reaction to vincristine, other chemotherapy agents, other medicines, foods, dyes, or preservatives -pregnant or trying to get pregnant -breast-feeding How should I use this medicine? This drug is given as an infusion into a vein. It is administered in a hospital or clinic by a specially trained health care professional. If you have pain, swelling, burning, or any unusual feeling around the site of your injection, tell your health care professional right away. Talk to your pediatrician regarding the use of this medicine in children. While this drug may be prescribed for selected conditions, precautions do apply. Overdosage: If you think you have taken too much of this medicine contact a poison control center or emergency room at once. NOTE: This medicine is only for you. Do not share this medicine with others. What if I miss a dose? It is important not to miss your dose. Call your doctor or health care professional if you are unable to keep an appointment. What may interact with this medicine? Do not take this medicine with any of the following medications: -itraconazole -mibefradil -voriconazole This medicine may also interact with the following medications: -cyclosporine -erythromycin -fluconazole -ketoconazole -medicines for HIV like delavirdine, efavirenz, nevirapine -medicines for seizures like ethotoin, fosphenotoin, phenytoin -medicines to increase blood counts like filgrastim, pegfilgrastim, sargramostim -other chemotherapy drugs like cisplatin, L-asparaginase, methotrexate, mitomycin, paclitaxel -pegaspargase -vaccines -zalcitabine, ddC Talk to your doctor or health care professional before taking any of these medicines: -acetaminophen -aspirin -ibuprofen -ketoprofen -naproxen This list may not describe all possible  interactions. Give your health care provider a list of all the medicines, herbs, non-prescription drugs, or dietary supplements you use. Also tell them if you smoke, drink alcohol, or use illegal drugs. Some items may interact with your medicine. What should I watch for while using this medicine? Your condition will be monitored carefully while you are receiving this medicine. You will need important blood work done while you are taking this medicine. This drug may make you feel generally unwell. This is not uncommon, as chemotherapy can affect healthy cells as well as cancer cells. Report any side effects. Continue your course of treatment even though you feel ill unless your doctor tells you to stop. In some cases, you may be given additional medicines to help with side effects. Follow all directions for their use. Call your doctor or health care professional for advice if you get a fever, chills or sore throat, or other symptoms of a cold or flu. Do not treat yourself. Avoid taking products that contain aspirin, acetaminophen, ibuprofen, naproxen, or ketoprofen unless instructed by your doctor. These medicines may hide a fever. Do not become pregnant while taking this medicine. Women should inform their doctor if they wish to become pregnant or think they might be pregnant. There is a potential for serious side effects to an unborn child. Talk to your health care professional or pharmacist for more information. Do not breast-feed an infant while taking this medicine. Men may have  a lower sperm count while taking this medicine. Talk to your doctor if you plan to father a child. What side effects may I notice from receiving this medicine? Side effects that you should report to your doctor or health care professional as soon as possible: -allergic reactions like skin rash, itching or hives, swelling of the face, lips, or tongue -breathing problems -confusion or changes in emotions or  moods -constipation -cough -mouth sores -muscle weakness -nausea and vomiting -pain, swelling, redness or irritation at the injection site -pain, tingling, numbness in the hands or feet -problems with balance, talking, walking -seizures -stomach pain -trouble passing urine or change in the amount of urine Side effects that usually do not require medical attention (report to your doctor or health care professional if they continue or are bothersome): -diarrhea -hair loss -jaw pain -loss of appetite This list may not describe all possible side effects. Call your doctor for medical advice about side effects. You may report side effects to FDA at 1-800-FDA-1088. Where should I keep my medicine? This drug is given in a hospital or clinic and will not be stored at home. NOTE: This sheet is a summary. It may not cover all possible information. If you have questions about this medicine, talk to your doctor, pharmacist, or health care provider.  2018 Elsevier/Gold Standard (2008-01-17 17:17:13)  Cyclophosphamide injection What is this medicine? CYCLOPHOSPHAMIDE (sye kloe FOSS fa mide) is a chemotherapy drug. It slows the growth of cancer cells. This medicine is used to treat many types of cancer like lymphoma, myeloma, leukemia, breast cancer, and ovarian cancer, to name a few. This medicine may be used for other purposes; ask your health care provider or pharmacist if you have questions. COMMON BRAND NAME(S): Cytoxan, Neosar What should I tell my health care provider before I take this medicine? They need to know if you have any of these conditions: -blood disorders -history of other chemotherapy -infection -kidney disease -liver disease -recent or ongoing radiation therapy -tumors in the bone marrow -an unusual or allergic reaction to cyclophosphamide, other chemotherapy, other medicines, foods, dyes, or preservatives -pregnant or trying to get pregnant -breast-feeding How should I  use this medicine? This drug is usually given as an injection into a vein or muscle or by infusion into a vein. It is administered in a hospital or clinic by a specially trained health care professional. Talk to your pediatrician regarding the use of this medicine in children. Special care may be needed. Overdosage: If you think you have taken too much of this medicine contact a poison control center or emergency room at once. NOTE: This medicine is only for you. Do not share this medicine with others. What if I miss a dose? It is important not to miss your dose. Call your doctor or health care professional if you are unable to keep an appointment. What may interact with this medicine? This medicine may interact with the following medications: -amiodarone -amphotericin B -azathioprine -certain antiviral medicines for HIV or AIDS such as protease inhibitors (e.g., indinavir, ritonavir) and zidovudine -certain blood pressure medications such as benazepril, captopril, enalapril, fosinopril, lisinopril, moexipril, monopril, perindopril, quinapril, ramipril, trandolapril -certain cancer medications such as anthracyclines (e.g., daunorubicin, doxorubicin), busulfan, cytarabine, paclitaxel, pentostatin, tamoxifen, trastuzumab -certain diuretics such as chlorothiazide, chlorthalidone, hydrochlorothiazide, indapamide, metolazone -certain medicines that treat or prevent blood clots like warfarin -certain muscle relaxants such as succinylcholine -cyclosporine -etanercept -indomethacin -medicines to increase blood counts like filgrastim, pegfilgrastim, sargramostim -medicines used as general anesthesia -metronidazole -  natalizumab This list may not describe all possible interactions. Give your health care provider a list of all the medicines, herbs, non-prescription drugs, or dietary supplements you use. Also tell them if you smoke, drink alcohol, or use illegal drugs. Some items may interact with your  medicine. What should I watch for while using this medicine? Visit your doctor for checks on your progress. This drug may make you feel generally unwell. This is not uncommon, as chemotherapy can affect healthy cells as well as cancer cells. Report any side effects. Continue your course of treatment even though you feel ill unless your doctor tells you to stop. Drink water or other fluids as directed. Urinate often, even at night. In some cases, you may be given additional medicines to help with side effects. Follow all directions for their use. Call your doctor or health care professional for advice if you get a fever, chills or sore throat, or other symptoms of a cold or flu. Do not treat yourself. This drug decreases your body's ability to fight infections. Try to avoid being around people who are sick. This medicine may increase your risk to bruise or bleed. Call your doctor or health care professional if you notice any unusual bleeding. Be careful brushing and flossing your teeth or using a toothpick because you may get an infection or bleed more easily. If you have any dental work done, tell your dentist you are receiving this medicine. You may get drowsy or dizzy. Do not drive, use machinery, or do anything that needs mental alertness until you know how this medicine affects you. Do not become pregnant while taking this medicine or for 1 year after stopping it. Women should inform their doctor if they wish to become pregnant or think they might be pregnant. Men should not father a child while taking this medicine and for 4 months after stopping it. There is a potential for serious side effects to an unborn child. Talk to your health care professional or pharmacist for more information. Do not breast-feed an infant while taking this medicine. This medicine may interfere with the ability to have a child. This medicine has caused ovarian failure in some women. This medicine has caused reduced sperm  counts in some men. You should talk with your doctor or health care professional if you are concerned about your fertility. If you are going to have surgery, tell your doctor or health care professional that you have taken this medicine. What side effects may I notice from receiving this medicine? Side effects that you should report to your doctor or health care professional as soon as possible: -allergic reactions like skin rash, itching or hives, swelling of the face, lips, or tongue -low blood counts - this medicine may decrease the number of white blood cells, red blood cells and platelets. You may be at increased risk for infections and bleeding. -signs of infection - fever or chills, cough, sore throat, pain or difficulty passing urine -signs of decreased platelets or bleeding - bruising, pinpoint red spots on the skin, black, tarry stools, blood in the urine -signs of decreased red blood cells - unusually weak or tired, fainting spells, lightheadedness -breathing problems -dark urine -dizziness -palpitations -swelling of the ankles, feet, hands -trouble passing urine or change in the amount of urine -weight gain -yellowing of the eyes or skin Side effects that usually do not require medical attention (report to your doctor or health care professional if they continue or are bothersome): -changes in nail  or skin color -hair loss -missed menstrual periods -mouth sores -nausea, vomiting This list may not describe all possible side effects. Call your doctor for medical advice about side effects. You may report side effects to FDA at 1-800-FDA-1088. Where should I keep my medicine? This drug is given in a hospital or clinic and will not be stored at home. NOTE: This sheet is a summary. It may not cover all possible information. If you have questions about this medicine, talk to your doctor, pharmacist, or health care provider.  2018 Elsevier/Gold Standard (2012-03-05  16:22:58)  Etoposide, VP-16 injection What is this medicine? ETOPOSIDE, VP-16 (e toe POE side) is a chemotherapy drug. It is used to treat testicular cancer, lung cancer, and other cancers. This medicine may be used for other purposes; ask your health care provider or pharmacist if you have questions. COMMON BRAND NAME(S): Etopophos, Toposar, VePesid What should I tell my health care provider before I take this medicine? They need to know if you have any of these conditions: -infection -kidney disease -liver disease -low blood counts, like low white cell, platelet, or red cell counts -an unusual or allergic reaction to etoposide, other medicines, foods, dyes, or preservatives -pregnant or trying to get pregnant -breast-feeding How should I use this medicine? This medicine is for infusion into a vein. It is administered in a hospital or clinic by a specially trained health care professional. Talk to your pediatrician regarding the use of this medicine in children. Special care may be needed. Overdosage: If you think you have taken too much of this medicine contact a poison control center or emergency room at once. NOTE: This medicine is only for you. Do not share this medicine with others. What if I miss a dose? It is important not to miss your dose. Call your doctor or health care professional if you are unable to keep an appointment. What may interact with this medicine? -aspirin -certain medications for seizures like carbamazepine, phenobarbital, phenytoin, valproic acid -cyclosporine -levamisole -warfarin This list may not describe all possible interactions. Give your health care provider a list of all the medicines, herbs, non-prescription drugs, or dietary supplements you use. Also tell them if you smoke, drink alcohol, or use illegal drugs. Some items may interact with your medicine. What should I watch for while using this medicine? Visit your doctor for checks on your progress.  This drug may make you feel generally unwell. This is not uncommon, as chemotherapy can affect healthy cells as well as cancer cells. Report any side effects. Continue your course of treatment even though you feel ill unless your doctor tells you to stop. In some cases, you may be given additional medicines to help with side effects. Follow all directions for their use. Call your doctor or health care professional for advice if you get a fever, chills or sore throat, or other symptoms of a cold or flu. Do not treat yourself. This drug decreases your body's ability to fight infections. Try to avoid being around people who are sick. This medicine may increase your risk to bruise or bleed. Call your doctor or health care professional if you notice any unusual bleeding. Talk to your doctor about your risk of cancer. You may be more at risk for certain types of cancers if you take this medicine. Do not become pregnant while taking this medicine or for at least 6 months after stopping it. Women should inform their doctor if they wish to become pregnant or think they might  be pregnant. Women of child-bearing potential will need to have a negative pregnancy test before starting this medicine. There is a potential for serious side effects to an unborn child. Talk to your health care professional or pharmacist for more information. Do not breast-feed an infant while taking this medicine. Men must use a latex condom during sexual contact with a woman while taking this medicine and for at least 4 months after stopping it. A latex condom is needed even if you have had a vasectomy. Contact your doctor right away if your partner becomes pregnant. Do not donate sperm while taking this medicine and for at least 4 months after you stop taking this medicine. Men should inform their doctors if they wish to father a child. This medicine may lower sperm counts. What side effects may I notice from receiving this medicine? Side  effects that you should report to your doctor or health care professional as soon as possible: -allergic reactions like skin rash, itching or hives, swelling of the face, lips, or tongue -low blood counts - this medicine may decrease the number of white blood cells, red blood cells and platelets. You may be at increased risk for infections and bleeding. -signs of infection - fever or chills, cough, sore throat, pain or difficulty passing urine -signs of decreased platelets or bleeding - bruising, pinpoint red spots on the skin, black, tarry stools, blood in the urine -signs of decreased red blood cells - unusually weak or tired, fainting spells, lightheadedness -breathing problems -changes in vision -mouth or throat sores or ulcers -pain, redness, swelling or irritation at the injection site -pain, tingling, numbness in the hands or feet -redness, blistering, peeling or loosening of the skin, including inside the mouth -seizures -vomiting Side effects that usually do not require medical attention (report to your doctor or health care professional if they continue or are bothersome): -diarrhea -hair loss -loss of appetite -nausea -stomach pain This list may not describe all possible side effects. Call your doctor for medical advice about side effects. You may report side effects to FDA at 1-800-FDA-1088. Where should I keep my medicine? This drug is given in a hospital or clinic and will not be stored at home. NOTE: This sheet is a summary. It may not cover all possible information. If you have questions about this medicine, talk to your doctor, pharmacist, or health care provider.  2018 Elsevier/Gold Standard (2015-04-13 11:53:23)

## 2017-02-20 ENCOUNTER — Ambulatory Visit (HOSPITAL_BASED_OUTPATIENT_CLINIC_OR_DEPARTMENT_OTHER): Payer: BLUE CROSS/BLUE SHIELD

## 2017-02-20 VITALS — BP 149/86 | HR 75 | Temp 97.7°F | Resp 18

## 2017-02-20 DIAGNOSIS — C8440 Peripheral T-cell lymphoma, not classified, unspecified site: Secondary | ICD-10-CM

## 2017-02-20 DIAGNOSIS — Z5111 Encounter for antineoplastic chemotherapy: Secondary | ICD-10-CM

## 2017-02-20 DIAGNOSIS — Z7189 Other specified counseling: Secondary | ICD-10-CM

## 2017-02-20 DIAGNOSIS — C8498 Mature T/NK-cell lymphomas, unspecified, lymph nodes of multiple sites: Secondary | ICD-10-CM

## 2017-02-20 MED ORDER — SODIUM CHLORIDE 0.9 % IV SOLN
98.0000 mg/m2 | Freq: Once | INTRAVENOUS | Status: AC
  Administered 2017-02-20: 200 mg via INTRAVENOUS
  Filled 2017-02-20: qty 10

## 2017-02-20 MED ORDER — PEGFILGRASTIM 6 MG/0.6ML ~~LOC~~ PSKT
6.0000 mg | PREFILLED_SYRINGE | Freq: Once | SUBCUTANEOUS | Status: AC
  Administered 2017-02-20: 6 mg via SUBCUTANEOUS
  Filled 2017-02-20: qty 0.6

## 2017-02-20 MED ORDER — PROCHLORPERAZINE MALEATE 10 MG PO TABS
ORAL_TABLET | ORAL | Status: AC
  Filled 2017-02-20: qty 1

## 2017-02-20 MED ORDER — PROCHLORPERAZINE MALEATE 10 MG PO TABS
10.0000 mg | ORAL_TABLET | Freq: Once | ORAL | Status: AC
  Administered 2017-02-20: 10 mg via ORAL

## 2017-02-20 MED ORDER — HOT PACK MISC ONCOLOGY
1.0000 | Freq: Once | Status: DC | PRN
  Filled 2017-02-20: qty 1

## 2017-02-20 MED ORDER — SODIUM CHLORIDE 0.9% FLUSH
10.0000 mL | INTRAVENOUS | Status: DC | PRN
  Administered 2017-02-20: 10 mL
  Filled 2017-02-20: qty 10

## 2017-02-20 MED ORDER — COLD PACK MISC ONCOLOGY
1.0000 | Freq: Once | Status: DC | PRN
  Filled 2017-02-20: qty 1

## 2017-02-20 MED ORDER — HEPARIN SOD (PORK) LOCK FLUSH 100 UNIT/ML IV SOLN
500.0000 [IU] | Freq: Once | INTRAVENOUS | Status: AC | PRN
  Administered 2017-02-20: 500 [IU]
  Filled 2017-02-20: qty 5

## 2017-02-20 MED ORDER — PROCHLORPERAZINE EDISYLATE 5 MG/ML IJ SOLN: 10.0000 mg | Freq: Once | INTRAMUSCULAR | Status: DC

## 2017-02-20 NOTE — Patient Instructions (Signed)
[facility] Discharge Instructions for Patients Receiving Chemotherapy  Today you received the following chemotherapy agents:  Etoposide (VP16)  To help prevent nausea and vomiting after your treatment, we encourage you to take your nausea medication as prescribed.   If you develop nausea and vomiting that is not controlled by your nausea medication, call the clinic.   BELOW ARE SYMPTOMS THAT SHOULD BE REPORTED IMMEDIATELY:  *FEVER GREATER THAN 100.5 F  *CHILLS WITH OR WITHOUT FEVER  NAUSEA AND VOMITING THAT IS NOT CONTROLLED WITH YOUR NAUSEA MEDICATION  *UNUSUAL SHORTNESS OF BREATH  *UNUSUAL BRUISING OR BLEEDING  TENDERNESS IN MOUTH AND THROAT WITH OR WITHOUT PRESENCE OF ULCERS  *URINARY PROBLEMS  *BOWEL PROBLEMS  UNUSUAL RASH Items with * indicate a potential emergency and should be followed up as soon as possible.  Feel free to call the clinic should you have any questions or concerns. The clinic phone number is [phone].  Please show the CHEMO ALERT CARD at check-in to the Emergency Department and triage nurse.   

## 2017-02-21 ENCOUNTER — Ambulatory Visit: Payer: BLUE CROSS/BLUE SHIELD

## 2017-02-23 ENCOUNTER — Telehealth: Payer: Self-pay

## 2017-02-23 ENCOUNTER — Telehealth: Payer: Self-pay | Admitting: Hematology

## 2017-02-23 ENCOUNTER — Encounter: Payer: Self-pay | Admitting: Hematology

## 2017-02-23 ENCOUNTER — Ambulatory Visit (HOSPITAL_BASED_OUTPATIENT_CLINIC_OR_DEPARTMENT_OTHER): Payer: BLUE CROSS/BLUE SHIELD | Admitting: Nutrition

## 2017-02-23 NOTE — Progress Notes (Signed)
61 year old male diagnosed with likely lymphoma versus unknown primary.  He is a patient of [name].  Past medical history includes Bell's palsy, GERD, gout, hypercholesterolemia, and melanoma.  Medications include Marinol, Ativan, Zofran, Protonix, prednisone and Compazine.  Labs include sodium 130 and glucose 155 on October 12.  Height: 5 feet 11 inches. Weight: 176.8 pounds. Weight on 08/20/2016 was 221 pounds. BMI: 24.66.  Patient reports that he lost 50 pounds intentionally before diagnosis. He lost an additional 25 pounds after diagnosis, which was not intentional. He currently feels constipated but is working on a bowel regimen. Patient drinks one to 2 bottles premier protein daily. Patient and wife have some questions regarding sugar and cancer, Probiotics, and organic food. Physical exam was deferred.  Patient is uncomfortable today secondary to constipation.  Nutrition diagnosis: Food and nutrition related knowledge deficit related to cancer diagnosis as evidenced by questions regarding food intake. Unintended weight loss related to cancer diagnosis as evidenced by 20% weight loss in the past 6 months.  Intervention: Educated patient to begin small frequent meals and snacks with a protein choice at each meal and snack. Recommended patient continue at least one premier protein daily. Educated patient on a bowel regimen to avoid further constipation. Recommended patient attempt to maintain current weight. Provided education on sugar and cancer, organic food, and probiotics.  Fact sheets were provided. Questions were answered.  Teach back method used.  Contact information provided.  Next visit: To be scheduled as needed.  **Disclaimer: This note was dictated with voice recognition software. Similar sounding words can inadvertently be transcribed and this note may contain transcription errors which may not have been corrected upon publication of note.**

## 2017-02-23 NOTE — Telephone Encounter (Signed)
Patients wife called in about injection time on Saturday I talked with samantha she said to just come in befor 1pm on sat.

## 2017-02-23 NOTE — Progress Notes (Signed)
Met with patient and spouse to introduce myself as Arboriculturist.  Asked patient if ded/OOP have been met for this year. They were sure it has been.  Advised them of the co-pay assistance program for [employer] for Neulasta and asked if they would be interested in enrolling in for 2019. They stated yes.  Attempted to enroll but receive message due to being early. Advised them I would apply at the beginning of the year to assist with 2019 charges. Also discussed possible assistance through [employer]. Went to portal and funds for his diagnosis had been exhausted. Advised I will monitor from time to time to see if funds reopen at the beginning of the year. Provided them a card with [employer]  Contact number and website information.  Gave them my card for any additional financial questions or concerns.

## 2017-02-23 NOTE — Progress Notes (Signed)
Called [employer] to complete an enrollment online for another patient and it would not allow me to.  Called patient to determine the reason and it was registered to this patient.  Will re-confirm enrollment at beginning of 2019. Placed card in book for this patient with his name and dob.

## 2017-02-24 ENCOUNTER — Ambulatory Visit: Payer: BLUE CROSS/BLUE SHIELD

## 2017-02-24 ENCOUNTER — Telehealth: Payer: Self-pay

## 2017-02-24 MED ORDER — LACTULOSE 10 GM/15ML PO SOLN: 20.0000 g | Freq: Three times a day (TID) | ORAL | 1 refills | Status: DC | PRN

## 2017-02-24 NOTE — Telephone Encounter (Signed)
No results today for BM. Pt is not throwing up. He is not eating much, abd is getting a little hard and round. There is no pain. Not using any pain meds. He is using zofran, and compazine since chemo to prevent nausea, he is not nauseated.   S/w Dr Irene Limbo: Instructed to stop zofran and compazine around the clock and only take it prn at this point. Discussed drinking warm prune juice, using mineral oil compress under warm compress on his abdomen, taking mineral oil PO, using digital stimulation, also will rx lactulose per Dr Irene Limbo.  Instructed to call back tomorrow if no results.

## 2017-02-24 NOTE — Telephone Encounter (Signed)
Pt called with constipation. LBM 10/18-19. He has used senna-s, he took 4 bid yesterday. And taken 4 senna-s today. He is using miralax, 1 dose per day. He used 1 bottle of mag citrate yesterday, he just drank 1/2 bottle mag citrate at 1130 today. He has been drinking at least 64 oz of water per day, plus juice.   Discussed increasing miralax to 2 doses per day on next cycle of chemo.  Will ask Dr Irene Limbo if it is OK to use enema or suppository.  Lvm: want to ask questions if he is obstructed or taking large amnt of pain meds.

## 2017-02-25 ENCOUNTER — Ambulatory Visit: Payer: BLUE CROSS/BLUE SHIELD

## 2017-02-25 ENCOUNTER — Telehealth: Payer: Self-pay

## 2017-02-25 ENCOUNTER — Other Ambulatory Visit: Payer: Self-pay

## 2017-02-25 NOTE — Telephone Encounter (Signed)
Unable to reach pt on home or mobile phone. Spoke with pt wife [name], and shared that [facility] called and they have an appt scheduled with Dr. Dellis Filbert on 03/19/17 at 1300. They should receive a packet in the mail with directions, but should arrive at 1215. [name] asked if [name] had sent pathology for a second opinion and received it back. I told her that at this time, I am not sure. Staff message sent to [name] requesting information.

## 2017-02-26 ENCOUNTER — Other Ambulatory Visit: Payer: Self-pay

## 2017-02-26 ENCOUNTER — Telehealth: Payer: Self-pay

## 2017-02-26 ENCOUNTER — Ambulatory Visit: Payer: BLUE CROSS/BLUE SHIELD

## 2017-02-26 NOTE — Telephone Encounter (Signed)
Spoke with pt wife on Monday (02/23/17) to f/u post chemotherapy. Pt had no complaints other than constipation. Spoke with pt directly, and he had been taking miralax. No BM for four days. Pt encouraged to additionally take Senna S 2 pills twice a day and 1/2 bottle of magnesium citrate in the morning and evening (maximum 356mL daily). If this did not help pt to call back on Tuesday afternoon. No further calls received from patient regarding constipation this week.

## 2017-02-27 ENCOUNTER — Other Ambulatory Visit (HOSPITAL_BASED_OUTPATIENT_CLINIC_OR_DEPARTMENT_OTHER): Payer: BLUE CROSS/BLUE SHIELD

## 2017-02-27 ENCOUNTER — Telehealth: Payer: Self-pay | Admitting: Hematology

## 2017-02-27 ENCOUNTER — Ambulatory Visit (HOSPITAL_BASED_OUTPATIENT_CLINIC_OR_DEPARTMENT_OTHER): Payer: BLUE CROSS/BLUE SHIELD | Admitting: Hematology

## 2017-02-27 VITALS — BP 125/69 | HR 113 | Temp 100.3°F | Resp 16 | Ht 71.0 in | Wt 167.2 lb

## 2017-02-27 DIAGNOSIS — D696 Thrombocytopenia, unspecified: Secondary | ICD-10-CM | POA: Diagnosis not present

## 2017-02-27 DIAGNOSIS — R634 Abnormal weight loss: Secondary | ICD-10-CM | POA: Diagnosis not present

## 2017-02-27 DIAGNOSIS — Z85820 Personal history of malignant melanoma of skin: Secondary | ICD-10-CM

## 2017-02-27 DIAGNOSIS — R161 Splenomegaly, not elsewhere classified: Secondary | ICD-10-CM

## 2017-02-27 DIAGNOSIS — C8442 Peripheral T-cell lymphoma, not classified, intrathoracic lymph nodes: Secondary | ICD-10-CM | POA: Diagnosis not present

## 2017-02-27 DIAGNOSIS — R591 Generalized enlarged lymph nodes: Secondary | ICD-10-CM

## 2017-02-27 DIAGNOSIS — D630 Anemia in neoplastic disease: Secondary | ICD-10-CM | POA: Diagnosis not present

## 2017-02-27 DIAGNOSIS — B370 Candidal stomatitis: Secondary | ICD-10-CM | POA: Diagnosis not present

## 2017-02-27 DIAGNOSIS — D709 Neutropenia, unspecified: Secondary | ICD-10-CM | POA: Diagnosis not present

## 2017-02-27 DIAGNOSIS — K769 Liver disease, unspecified: Secondary | ICD-10-CM | POA: Diagnosis not present

## 2017-02-27 DIAGNOSIS — C8440 Peripheral T-cell lymphoma, not classified, unspecified site: Secondary | ICD-10-CM | POA: Diagnosis not present

## 2017-02-27 DIAGNOSIS — D6481 Anemia due to antineoplastic chemotherapy: Secondary | ICD-10-CM

## 2017-02-27 LAB — CBC & DIFF AND RETIC
BASO%: 0.5 % (ref 0.0–2.0)
BASOS ABS: 0 10*3/uL (ref 0.0–0.1)
EOS ABS: 0 10*3/uL (ref 0.0–0.5)
EOS%: 0 % (ref 0.0–7.0)
HEMATOCRIT: 28.2 % — AB (ref 38.4–49.9)
HEMOGLOBIN: 9.6 g/dL — AB (ref 13.0–17.1)
IMMATURE RETIC FRACT: 16.7 % — AB (ref 3.00–10.60)
LYMPH%: 27.7 % (ref 14.0–49.0)
MCH: 28.3 pg (ref 27.2–33.4)
MCHC: 34 g/dL (ref 32.0–36.0)
MCV: 83.2 fL (ref 79.3–98.0)
MONO#: 0.2 10*3/uL (ref 0.1–0.9)
MONO%: 11.3 % (ref 0.0–14.0)
NEUT#: 1.3 10*3/uL — ABNORMAL LOW (ref 1.5–6.5)
NEUT%: 60.5 % (ref 39.0–75.0)
Platelets: 36 10*3/uL — ABNORMAL LOW (ref 140–400)
RBC: 3.39 10*6/uL — ABNORMAL LOW (ref 4.20–5.82)
RDW: 16.8 % — ABNORMAL HIGH (ref 11.0–14.6)
Retic %: 0.4 % — ABNORMAL LOW (ref 0.80–1.80)
Retic Ct Abs: 13.56 10*3/uL — ABNORMAL LOW (ref 34.80–93.90)
WBC: 2.1 10*3/uL — ABNORMAL LOW (ref 4.0–10.3)
lymph#: 0.6 10*3/uL — ABNORMAL LOW (ref 0.9–3.3)

## 2017-02-27 LAB — COMPREHENSIVE METABOLIC PANEL
ALBUMIN: 3.3 g/dL — AB (ref 3.5–5.0)
ALK PHOS: 168 U/L — AB (ref 40–150)
ALT: 18 U/L (ref 0–55)
AST: 17 U/L (ref 5–34)
Anion Gap: 9 mEq/L (ref 3–11)
BILIRUBIN TOTAL: 1.5 mg/dL — AB (ref 0.20–1.20)
BUN: 11.5 mg/dL (ref 7.0–26.0)
CALCIUM: 9.1 mg/dL (ref 8.4–10.4)
CO2: 30 mEq/L — ABNORMAL HIGH (ref 22–29)
Chloride: 93 mEq/L — ABNORMAL LOW (ref 98–109)
Creatinine: 0.8 mg/dL (ref 0.7–1.3)
GLUCOSE: 171 mg/dL — AB (ref 70–140)
Potassium: 3.7 mEq/L (ref 3.5–5.1)
SODIUM: 132 meq/L — AB (ref 136–145)
TOTAL PROTEIN: 6.1 g/dL — AB (ref 6.4–8.3)

## 2017-02-27 LAB — URIC ACID: Uric Acid, Serum: 3.1 mg/dl (ref 2.6–7.4)

## 2017-02-27 LAB — LACTATE DEHYDROGENASE: LDH: 225 U/L (ref 125–245)

## 2017-02-27 MED ORDER — NYSTATIN 100000 UNIT/ML MT SUSP: 5.0000 mL | Freq: Four times a day (QID) | OROMUCOSAL | 1 refills | Status: AC

## 2017-02-27 MED ORDER — FLUCONAZOLE 200 MG PO TABS: 200.0000 mg | ORAL_TABLET | Freq: Every day | ORAL | 0 refills | Status: DC

## 2017-02-27 NOTE — Progress Notes (Signed)
HEMATOLOGY/ONCOLOGY CONSULTATION NOTE  Date of Service: 02/27/2017  Patient Care Team: Hulan Fess, MD as PCP - General (Family Medicine)  CHIEF COMPLAINTS/PURPOSE OF CONSULTATION:   F/u for newly diagnosed T cell NHL  CURRENT THERAPY: R-CHOEP  HISTORY OF PRESENTING ILLNESS:   Joseph Powell is a wonderful 61 y.o. male here for his toxicity check following his first cycle of R-CHOEP. He is currently nine days following his first cycle. He received his Neulast injection on Saturday. He reports that overall he is fatigued from his treatments, but he states that he feels much improved from his first few treatment days. He was significantly constipated, but following several laxatives and stool softeners this is now improved completely. He denies any fever, chills,   His wife does note that he has continued to lose weight, but he has been taking marinol but due to his continued decreased taste he still has not been able to eat even with this medication. Noted to have significant thrush and was prescribed Nystatin mouthwash and fluconazole.  He denies any soreness within his mouth or pain within the throat.   He denies fever, chills, rashes, significant bone pains, abdominal pain, back pain, or any other associated symptoms.   MEDICAL HISTORY:  Past Medical History:  Diagnosis Date  . Allergic rhinitis   . Asthma    slight  . Bell's palsy   . GERD (gastroesophageal reflux disease)   . Gout   . Hypercholesteremia   . Melanoma ([place])   . Osteoarthritis   . Sleep apnea    Resolving  . Thrombocytopenia ([place])   . Tubular adenoma     SURGICAL HISTORY: Past Surgical History:  Procedure Laterality Date  . IR FLUORO GUIDE PORT INSERTION RIGHT  02/17/2017  . IR US GUIDE VASC ACCESS RIGHT  02/17/2017  . KNEE SURGERY  1993  . MELANOMA EXCISION    . NASAL RECONSTRUCTION    . VASECTOMY  1999    SOCIAL HISTORY: Social History   Social History  . Marital status: Married      Spouse name: N/A  . Number of children: N/A  . Years of education: N/A   Occupational History  . Risk analyst    Social History Main Topics  . Smoking status: Never Smoker  . Smokeless tobacco: Never Used  . Alcohol use 1.0 oz/week    2 Standard drinks or equivalent per week     Comment: socially  . Drug use: No  . Sexual activity: Not on file   Other Topics Concern  . Not on file   Social History Narrative  . No narrative on file    FAMILY HISTORY: Family History  Problem Relation Age of Onset  . Diabetes Mother   . Asthma Mother   . Gout Brother   . Colon cancer Paternal Uncle   . Esophageal cancer Neg Hx   . Stomach cancer Neg Hx     ALLERGIES:  is allergic to penicillins.  MEDICATIONS:  Current Outpatient Prescriptions  Medication Sig Dispense Refill  . albuterol (PROVENTIL HFA;VENTOLIN HFA) 108 (90 Base) MCG/ACT inhaler Inhale 1-2 puffs into the lungs every 6 (six) hours as needed for wheezing or shortness of breath.    . allopurinol (ZYLOPRIM) 100 MG tablet Take 100 mg by mouth 2 (two) times daily.    . benzonatate (TESSALON) 200 MG capsule Take 1 capsule (200 mg total) by mouth 3 (three) times daily as needed for cough. 45 capsule 1  .  dronabinol (MARINOL) 5 MG capsule Take 1 capsule (5 mg total) by mouth 2 (two) times daily before a meal. 60 capsule 0  . fluconazole (DIFLUCAN) 200 MG tablet Take 1 tablet (200 mg total) by mouth daily. Hold 24h prior to chemotherapy 30 tablet 0  . lactulose (CHRONULAC) 10 GM/15ML solution Take 30 mLs (20 g total) by mouth 3 (three) times daily as needed for moderate constipation. 240 mL 1  . lidocaine-prilocaine (EMLA) cream Apply to affected area once 30 g 3  . LORazepam (ATIVAN) 0.5 MG tablet Take 1 tablet (0.5 mg total) by mouth every 8 (eight) hours as needed for anxiety. 30 tablet 0  . mometasone-formoterol (DULERA) 100-5 MCG/ACT AERO Inhale 2 puffs into the lungs 2 (two) times daily. 1 Inhaler 11  . nystatin  (MYCOSTATIN) 100000 UNIT/ML suspension Take 5 mLs (500,000 Units total) by mouth 4 (four) times daily. 473 mL 1  . ondansetron (ZOFRAN) 8 MG tablet Take 1 tablet (8 mg total) by mouth 2 (two) times daily as needed for refractory nausea / vomiting. Start on day 3 after each chemotherapy 30 tablet 1  . pantoprazole (PROTONIX) 40 MG tablet Take 40 mg by mouth daily.     . pravastatin (PRAVACHOL) 40 MG tablet Take 40 mg by mouth daily.      . predniSONE (DELTASONE) 20 MG tablet Take 3 tablets (60 mg total) by mouth daily. Take on days 1-5 of chemotherapy. 15 tablet 5  . prochlorperazine (COMPAZINE) 10 MG tablet Take 1 tablet (10 mg total) by mouth every 6 (six) hours as needed (Nausea or vomiting). 30 tablet 6   Current Facility-Administered Medications  Medication Dose Route Frequency Provider Last Rate Last Dose  . 0.9 %  sodium chloride infusion  500 mL Intravenous Continuous Milus Banister, MD        REVIEW OF SYSTEMS:    10 Point review of Systems was done is negative except as noted above.  PHYSICAL EXAMINATION: ECOG PERFORMANCE STATUS: 1 - Symptomatic but completely ambulatory  . Vitals:   02/27/17 1131  BP: 125/69  Pulse: (!) 113  Resp: 16  Temp: 100.3 F (37.9 C)  SpO2: 100%   Filed Weights   02/27/17 1131  Weight: 167 lb 3.2 oz (75.8 kg)   .Body mass index is 23.32 kg/m.  GENERAL:alert, in no acute distress and comfortable SKIN: no acute rashes, no significant lesions EYES: conjunctiva are pink and non-injected, sclera anicteric OROPHARYNX: MMM, no exudates, no oropharyngeal erythema or ulceration NECK: supple, no JVD LYMPH:  (+) 2 palpable lymphadenopathy in the cervical left lower neck and no obvious lymphadenopathy in axillary or inguinal regions that I could appreciate LUNGS: clear to auscultation b/l with normal respiratory effort HEART: regular rate & rhythm ABDOMEN:  normoactive bowel sounds , non tender, not distended. Borderline palpable splenomegaly, no  overt hepatomegaly.  Extremity: no pedal edema PSYCH: alert & oriented x 3 with fluent speech NEURO: no focal motor/sensory deficits  LABORATORY DATA:  I have reviewed the data as listed  . CBC Latest Ref Rng & Units 02/27/2017 02/19/2017 02/17/2017  WBC 4.0 - 10.3 10e3/uL 2.1(L) 4.3 6.5  Hemoglobin 13.0 - 17.1 g/dL 9.6(L) 10.2(L) 11.5(L)  Hematocrit 38.4 - 49.9 % 28.2(L) 31.3(L) 34.6(L)  Platelets 140 - 400 10e3/uL 36(L) 66(L) 55(L)    . CMP Latest Ref Rng & Units 02/27/2017 02/19/2017 02/13/2017  Glucose 70 - 140 mg/dl 171(H) 335(H) 155(H)  BUN 7.0 - 26.0 mg/dL 11.5 12.1 23.3  Creatinine 0.7 -  1.3 mg/dL 0.8 0.8 1.0  Sodium 136 - 145 mEq/L 132(L) 134(L) 130(L)  Potassium 3.5 - 5.1 mEq/L 3.7 4.1 4.3  Chloride 101 - 111 mmol/L - - -  CO2 22 - 29 mEq/L 30(H) 22 26  Calcium 8.4 - 10.4 mg/dL 9.1 8.5 9.3  Total Protein 6.4 - 8.3 g/dL 6.1(L) 5.7(L) 6.6  Total Bilirubin 0.20 - 1.20 mg/dL 1.50(H) 0.89 1.74(H)  Alkaline Phos 40 - 150 U/L 168(H) 213(H) 291(H)  AST 5 - 34 U/L 17 51(H) 90(H)  ALT 0 - 55 U/L 18 46 80(H)   Component     Latest Ref Rng & Units 01/26/2017  HIV Screen 4th Generation wRfx     Non Reactive Non Reactive  Hep C Virus Ab     0.0 - 0.9 s/co ratio <0.1  LDH     125 - 245 U/L 522 (H)  Sed Rate     0 - 30 mm/hr 6  Hep B Core Ab, Tot     Negative Negative  Hepatitis B Surface Ag     Negative Negative       RADIOGRAPHIC STUDIES: I have personally reviewed the radiological images as listed and agreed with the findings in the report. Nm Pet Image Initial (pi) Skull Base To Thigh  Result Date: 01/29/2017 CLINICAL DATA:  Initial treatment strategy for lymphoma. EXAM: NUCLEAR MEDICINE PET SKULL BASE TO THIGH TECHNIQUE: 9.2 mCi F-18 FDG was injected intravenously. Full-ring PET imaging was performed from the skull base to thigh after the radiotracer. CT data was obtained and used for attenuation correction and anatomic localization. FASTING BLOOD GLUCOSE:  Value:  150 mg/dl COMPARISON:  None. FINDINGS: NECK: Bilateral hypermetabolic cervical lymph nodes identified. Right level-II lymph node measures 5 mm and has an SUV max equal to 6.7. 9 mm right supraclavicular lymph node is identified within SUV max equal to 11.18. Left supraclavicular lymph node measures 6 mm and has an SUV max equal to 8.85. CHEST: Extensive mediastinal and bilateral hilar adenopathy identified. There is also bilateral internal mammary lymph node chain hypermetabolic adenopathy. Nodal mass within the pre-vascular region of the anterior mediastinum measures 7 cm and has an SUV max equal to 28.0. Left paratracheal lymph node measures 1.5 cm and has an SUV max equal to 23.68. 1.3 cm sub- carinal lymph node is identified with an SUV max equal to 14.6. No pleural effusion.  No hypermetabolic pulmonary nodules. ABDOMEN/PELVIS: No focal areas of increased uptake within the liver. The spleen measures 16.9 x 7.0 x 12.8 cm (volume = 790 cm^3). Multiple hypermetabolic splenic lesions identified. Within the anterior spleen there is a 1.9 cm hypermetabolic lesion with an SUV max equal to 11.6. Extensive hypermetabolic adenopathy within the abdomen. Ileocolic mesenteric nodal mass measures 5.1 cm and has an SUV max equal to 16.6. Left periaortic nodal mass measures 5.9 cm and has an SUV max equal to 24.76. Retrocrural node measures 2.1 cm and has an SUV max equal to 19.03. Bilateral hypermetabolic iliac nodes are identified. Measures 1.3 cm and has an SUV max equal to 21.2. Right external iliac node measures 1.2 cm and has an SUV max equal to 14.5. SKELETON: Extensive hypermetabolic tumor is identified throughout the axial and appendicular skeleton. Hypermetabolic lesion within the proximal right humerus has an SUV max equal to 10.77. Hypermetabolic lesion within the right iliac bone has an SUV max equal to 11.25. Within the proximal right femur there is a intensely hypermetabolic lesion with an SUV max equal to  12.32. FDG uptake within the T11 vertebra has an SUV max equal to 14.5. Within the sternum hypermetabolic tumor has an SUV max equal to 10.95. IMPRESSION: 1. Examination is positive for extensive hypermetabolic adenopathy within the neck, chest, abdomen and pelvis. 2. Splenomegaly. Multifocal hypermetabolic splenic lesions are noted. 3. Extensive hypermetabolic tumor involvement of the axial and appendicular skeleton. Electronically Signed   By: Kerby Moors M.D.   On: 01/29/2017 14:55   Ir US Guide Vasc Access Right  Result Date: 02/17/2017 INDICATION: 61 year old male with a past history of melanoma and now matured T-cell lymphoma. He requires durable venous access for chemotherapy. EXAM: IMPLANTED PORT A CATH PLACEMENT WITH ULTRASOUND AND FLUOROSCOPIC GUIDANCE MEDICATIONS: Clindamycin 600 mg IV; The antibiotic was administered within an appropriate time interval prior to skin puncture. ANESTHESIA/SEDATION: Versed 2 mg IV; Fentanyl 100 mcg IV; Moderate Sedation Time:  20 minutes The patient was continuously monitored during the procedure by the interventional radiology nurse under my direct supervision. FLUOROSCOPY TIME:  0 minutes, 12 seconds (2 mGy) COMPLICATIONS: None immediate. PROCEDURE: The right neck and chest was prepped with chlorhexidine, and draped in the usual sterile fashion using maximum barrier technique (cap and mask, sterile gown, sterile gloves, large sterile sheet, hand hygiene and cutaneous antiseptic). Local anesthesia was attained by infiltration with 1% lidocaine with epinephrine. Ultrasound demonstrated patency of the right internal jugular vein, and this was documented with an image. Under real-time ultrasound guidance, this vein was accessed with a 21 gauge micropuncture needle and image documentation was performed. A small dermatotomy was made at the access site with an 11 scalpel. A 0.018" wire was advanced into the SVC and the access needle exchanged for a 38F micropuncture  vascular sheath. The 0.018" wire was then removed and a 0.035" wire advanced into the IVC. An appropriate location for the subcutaneous reservoir was selected below the clavicle and an incision was made through the skin and underlying soft tissues. The subcutaneous tissues were then dissected using a combination of blunt and sharp surgical technique and a pocket was formed. A single lumen power injectable portacatheter was then tunneled through the subcutaneous tissues from the pocket to the dermatotomy and the port reservoir placed within the subcutaneous pocket. The venous access site was then serially dilated and a peel away vascular sheath placed over the wire. The wire was removed and the port catheter advanced into position under fluoroscopic guidance. The catheter tip is positioned in the cavoatrial junction. This was documented with a spot image. The portacatheter was then tested and found to flush and aspirate well. The port was flushed with saline followed by 100 units/mL heparinized saline. The pocket was then closed in two layers using first subdermal inverted interrupted absorbable sutures followed by a running subcuticular suture. The epidermis was then sealed with Dermabond. The dermatotomy at the venous access site was also closed with a single inverted subdermal suture and the epidermis sealed with Dermabond. IMPRESSION: Successful placement of a right IJ approach Power Port with ultrasound and fluoroscopic guidance. The catheter is ready for use. Signed, Criselda Peaches, MD [facility] Electronically Signed   By: Jacqulynn Cadet M.D.   On: 02/17/2017 13:06   Ir Fluoro Guide Port Insertion Right  Result Date: 02/17/2017 INDICATION: 61 year old male with a past history of melanoma and now matured T-cell lymphoma. He requires durable venous access for chemotherapy. EXAM: IMPLANTED PORT A CATH PLACEMENT WITH ULTRASOUND AND  FLUOROSCOPIC GUIDANCE MEDICATIONS: Clindamycin 600 mg  IV; The antibiotic was administered within an appropriate time interval prior to skin puncture. ANESTHESIA/SEDATION: Versed 2 mg IV; Fentanyl 100 mcg IV; Moderate Sedation Time:  20 minutes The patient was continuously monitored during the procedure by the interventional radiology nurse under my direct supervision. FLUOROSCOPY TIME:  0 minutes, 12 seconds (2 mGy) COMPLICATIONS: None immediate. PROCEDURE: The right neck and chest was prepped with chlorhexidine, and draped in the usual sterile fashion using maximum barrier technique (cap and mask, sterile gown, sterile gloves, large sterile sheet, hand hygiene and cutaneous antiseptic). Local anesthesia was attained by infiltration with 1% lidocaine with epinephrine. Ultrasound demonstrated patency of the right internal jugular vein, and this was documented with an image. Under real-time ultrasound guidance, this vein was accessed with a 21 gauge micropuncture needle and image documentation was performed. A small dermatotomy was made at the access site with an 11 scalpel. A 0.018" wire was advanced into the SVC and the access needle exchanged for a 79F micropuncture vascular sheath. The 0.018" wire was then removed and a 0.035" wire advanced into the IVC. An appropriate location for the subcutaneous reservoir was selected below the clavicle and an incision was made through the skin and underlying soft tissues. The subcutaneous tissues were then dissected using a combination of blunt and sharp surgical technique and a pocket was formed. A single lumen power injectable portacatheter was then tunneled through the subcutaneous tissues from the pocket to the dermatotomy and the port reservoir placed within the subcutaneous pocket. The venous access site was then serially dilated and a peel away vascular sheath placed over the wire. The wire was removed and the port catheter advanced into position under fluoroscopic  guidance. The catheter tip is positioned in the cavoatrial junction. This was documented with a spot image. The portacatheter was then tested and found to flush and aspirate well. The port was flushed with saline followed by 100 units/mL heparinized saline. The pocket was then closed in two layers using first subdermal inverted interrupted absorbable sutures followed by a running subcuticular suture. The epidermis was then sealed with Dermabond. The dermatotomy at the venous access site was also closed with a single inverted subdermal suture and the epidermis sealed with Dermabond. IMPRESSION: Successful placement of a right IJ approach Power Port with ultrasound and fluoroscopic guidance. The catheter is ready for use. Signed, Criselda Peaches, MD [facility] Radiology Electronically Signed   By: Jacqulynn Cadet M.D.   On: 02/17/2017 13:06    ASSESSMENT & PLAN:    EMIN FOREE is a 61 y.o. 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 Splenomegaly with numerous low-attenuation splenic masses. Several small low-attenuation liver masses. The leading consideration is lymphoma, although metastatic disease with unknown primary in on the differential.  #2 Clinical evidence of left lower anterior cervical Lymphadenopathy  #3 Acute on chronic Thrombocytopenia. Patient has has some chronic thrombocytopenia 85-125k for a few years. Platelets today are down to 36k in the setting of newly diagnosed lymphoma and treatment with R-CHOEP Could be from hypersplenism.  #4 abnormal liver function tests likely related to liver involvement by tumor.Improving.  # 5 Anemia from NHL+ Chemotherapy  # 6 Mild neutropenia - hopefully continues to improve with Neulasta/time.  # 7 Significant Oro-pharyngeal thrush -- causing low grade fever and  change in taste -- treating aggressively with Nystatin mouthwashes and PO  Fluconazole.  Plan - overall appears to be have tolerated 1st cycle of R-CHOEP relatively well.  -He does currently have thrush, we will place him on fluconazole and Nystatin mouth wash to help with this.  -He has followed up with Ernestene Kiel, they are trying to implement smaller meals and more frequent meals into his regimen to help with his appetite and we also discussed ways to try to increase his po intake. Hopefully treating his thrush will help. -His counts have been stable and will hopefully bounce with his continued treatment. He has been mildly anemic but this has held overall, platelets have dropped to 36K but I expect this to bounce.  -will need to readdress chemotherapy and Etoposide doses based on blood count recovery -No significant evidence of TLS.  -he has recommended to seek immediate help for any fevers. -He has been able to keep with his fluid intake. I did offer him IV fluid replacement, but he would like to hold off on this for now -PET/CT following third cycle to reevaluate.  -does have an appointment with Dr Duanne Moron for 2nd opinion at [facility].  #2 History of Melanoma, 2009 -Followed by Dr. Allyson Sabal for continued skin screening   #3 Patient Active Problem List   Diagnosis Date Noted  . Counseling regarding advanced care planning and goals of care 02/12/2017  . Mature T/NK-cell lymphomas, unspecified, lymph nodes of multiple sites ([place]) 02/10/2017  . Cough variant asthma vs UACS 01/27/2017  . Dyslipidemia 08/05/2016  . Mild asthma 08/05/2016  . DJD (degenerative joint disease) 08/05/2016  . HLD (hyperlipidemia) 07/27/2016  . GERD (gastroesophageal reflux disease) 07/27/2016  . Gout 07/27/2016  . Discomfort in chest 07/27/2016  . OSA (obstructive sleep apnea) 09/19/2013  . Thrombocytopenia ([place]) 04/14/2011  . SCHATZKI'S RING 08/21/2009    RTC with Dr Irene Limbo with labs on Nina on 03/12/17  Plz  schedule cycle 3 and cycle 4 of treatment   All of the patients questions were answered with apparent satisfaction. The patient knows to call the clinic with any problems, questions or concerns.  I spent 25 minutes counseling the patient face to face. The total time spent in the appointment was 40 minutes and more than 50% was on counseling and direct patient cares.    Sullivan Lone MD [place] [facility] Hematology/Oncology Physician [facility]  (Office):       [phone] (Work cell):  [phone] (Fax):           [phone]  02/27/2017 11:48 AM   This document serves as a record of services personally performed by Sullivan Lone, MD. It was created on his behalf by Reola Mosher, a trained medical scribe. The creation of this record is based on the scribe's personal observations and the provider's statements to them. This document has been checked and approved by the attending provider.

## 2017-02-27 NOTE — Telephone Encounter (Signed)
Patient wanted to change dates on los and Dr Irene Limbo said ok

## 2017-02-27 NOTE — Telephone Encounter (Signed)
Error

## 2017-02-27 NOTE — Telephone Encounter (Signed)
Gave avs clalendar for November - December

## 2017-03-02 ENCOUNTER — Telehealth: Payer: Self-pay

## 2017-03-02 ENCOUNTER — Telehealth: Payer: Self-pay | Admitting: *Deleted

## 2017-03-02 LAB — KAPPA/LAMBDA LIGHT CHAINS
IG KAPPA FREE LIGHT CHAIN: 8.9 mg/L (ref 3.3–19.4)
Ig Lambda Free Light Chain: 17.4 mg/L (ref 5.7–26.3)
KAPPA/LAMBDA FLC RATIO: 0.51 (ref 0.26–1.65)

## 2017-03-02 NOTE — Telephone Encounter (Signed)
Pt had a throat irritation. Is coughing a lot. A pulmonologist is seeing him for the cough. Dr Melvyn Novas has given him a few inhalers, that has helped a lot. He is having pain/soreness across the bottom of left ribcage to slightly passed middle on the right.   He has not used anything for the soreness. No radiation up neck or down arms, no pressure, it did show up after coughing got worse. No fever  Instructed him to use tylenol or advil, use ice to the area 10 minutes every 2-3 hours. Call back if this does not help.

## 2017-03-02 NOTE — Telephone Encounter (Signed)
Error

## 2017-03-03 LAB — MULTIPLE MYELOMA PANEL, SERUM
Albumin SerPl Elph-Mcnc: 2.9 g/dL (ref 2.9–4.4)
Albumin/Glob SerPl: 1.2 (ref 0.7–1.7)
Alpha 1: 0.4 g/dL (ref 0.0–0.4)
Alpha2 Glob SerPl Elph-Mcnc: 0.9 g/dL (ref 0.4–1.0)
B-GLOBULIN SERPL ELPH-MCNC: 0.9 g/dL (ref 0.7–1.3)
GAMMA GLOB SERPL ELPH-MCNC: 0.4 g/dL (ref 0.4–1.8)
GLOBULIN, TOTAL: 2.5 g/dL (ref 2.2–3.9)
IgA, Qn, Serum: 130 mg/dL (ref 61–437)
IgG, Qn, Serum: 363 mg/dL — ABNORMAL LOW (ref 700–1600)
IgM, Qn, Serum: 16 mg/dL — ABNORMAL LOW (ref 20–172)
Total Protein: 5.4 g/dL — ABNORMAL LOW (ref 6.0–8.5)

## 2017-03-09 NOTE — Progress Notes (Signed)
HEMATOLOGY/ONCOLOGY CONSULTATION NOTE  Date of Service: 03/11/2017  Patient Care Team: Hulan Fess, MD as PCP - General (Family Medicine)  CHIEF COMPLAINTS/PURPOSE OF CONSULTATION:   F/u for newly diagnosed T cell NHL  CURRENT THERAPY: R-CHOEP  HISTORY OF PRESENTING ILLNESS:  See previous note for details  INTERVAL HISTORY  Joseph Powell is a wonderful 61 y.o. male here for his scheduled followup prior to second cycle of chemotherapy. Plt today are at 130K, Hgb at 9.7. WBC are at 17.2. LDH is 267. He is accompanied by his wife. He reports that he is doing well overall. He states that his appetite has improved and he has gained one pound since his last visit. Ritta Slot has resolved and night sweats have nearly resolved also. He has a history of borderline A1c without need for mx, however instructed the pt today to monitor this at home as well as to f/u with his pcp for consideration of insulin for this while on steroids given hyperglycemia. His wife notes that he has understandably been experiencing anxiety which could likely be from prednisone.  His case was discussed is our tumor and since his neoplastic T cells are aberrantly expressing CD20 -- there was a recommendation to add Rituxan to his CHOEP which will be done from this cycle.  On review of systems, pt reports one mild night sweat, pain and soreness to the left jaw and denies fever, chills, constipation, abdominal pain and any acute new accompanying symptoms.     MEDICAL HISTORY:  Past Medical History:  Diagnosis Date  . Allergic rhinitis   . Asthma    slight  . Bell's palsy   . GERD (gastroesophageal reflux disease)   . Gout   . Hypercholesteremia   . Melanoma ([place])   . Osteoarthritis   . Sleep apnea    Resolving  . Thrombocytopenia ([place])   . Tubular adenoma     SURGICAL HISTORY: Past Surgical History:  Procedure Laterality Date  . IR FLUORO GUIDE PORT INSERTION RIGHT  02/17/2017  . IR US GUIDE VASC  ACCESS RIGHT  02/17/2017  . KNEE SURGERY  1993  . MELANOMA EXCISION    . NASAL RECONSTRUCTION    . VASECTOMY  1999    SOCIAL HISTORY: Social History   Socioeconomic History  . Marital status: Married    Spouse name: Not on file  . Number of children: Not on file  . Years of education: Not on file  . Highest education level: Not on file  Social Needs  . Financial resource strain: Not on file  . Food insecurity - worry: Not on file  . Food insecurity - inability: Not on file  . Transportation needs - medical: Not on file  . Transportation needs - non-medical: Not on file  Occupational History  . Occupation: Risk analyst  Tobacco Use  . Smoking status: Never Smoker  . Smokeless tobacco: Never Used  Substance and Sexual Activity  . Alcohol use: Yes    Alcohol/week: 1.0 oz    Types: 2 Standard drinks or equivalent per week    Comment: socially  . Drug use: No  . Sexual activity: Not on file  Other Topics Concern  . Not on file  Social History Narrative  . Not on file    FAMILY HISTORY: Family History  Problem Relation Age of Onset  . Diabetes Mother   . Asthma Mother   . Gout Brother   . Colon cancer Paternal Uncle   .  Esophageal cancer Neg Hx   . Stomach cancer Neg Hx     ALLERGIES:  is allergic to penicillins.  MEDICATIONS:  Current Outpatient Medications  Medication Sig Dispense Refill  . albuterol (PROVENTIL HFA;VENTOLIN HFA) 108 (90 Base) MCG/ACT inhaler Inhale 1-2 puffs into the lungs every 6 (six) hours as needed for wheezing or shortness of breath.    . allopurinol (ZYLOPRIM) 100 MG tablet Take 100 mg by mouth 2 (two) times daily.    . benzonatate (TESSALON) 200 MG capsule Take 1 capsule (200 mg total) by mouth 3 (three) times daily as needed for cough. 45 capsule 1  . chlorpheniramine (CHLOR-TRIMETON) 4 MG tablet Take 4 mg every 4 (four) hours as needed by mouth for allergies.    Marland Kitchen dronabinol (MARINOL) 5 MG capsule Take 1 capsule (5 mg total) by mouth  2 (two) times daily before a meal. 60 capsule 0  . fluconazole (DIFLUCAN) 200 MG tablet Take 1 tablet (200 mg total) by mouth daily. Hold 24h prior to chemotherapy 30 tablet 0  . lactulose (CHRONULAC) 10 GM/15ML solution Take 30 mLs (20 g total) by mouth 3 (three) times daily as needed for moderate constipation. 240 mL 1  . lidocaine-prilocaine (EMLA) cream Apply to affected area once 30 g 3  . LORazepam (ATIVAN) 0.5 MG tablet Take 1 tablet (0.5 mg total) every 8 (eight) hours as needed by mouth for anxiety. 30 tablet 0  . mometasone-formoterol (DULERA) 100-5 MCG/ACT AERO Inhale 2 puffs into the lungs 2 (two) times daily. 1 Inhaler 11  . ondansetron (ZOFRAN) 8 MG tablet Take 1 tablet (8 mg total) by mouth 2 (two) times daily as needed for refractory nausea / vomiting. Start on day 3 after each chemotherapy 30 tablet 1  . pantoprazole (PROTONIX) 40 MG tablet Take 40 mg by mouth daily.     . pravastatin (PRAVACHOL) 40 MG tablet Take 40 mg by mouth daily.      . predniSONE (DELTASONE) 20 MG tablet Take 3 tablets (60 mg total) by mouth daily. Take on days 1-5 of chemotherapy. 15 tablet 5  . prochlorperazine (COMPAZINE) 10 MG tablet Take 1 tablet (10 mg total) by mouth every 6 (six) hours as needed (Nausea or vomiting). 30 tablet 6   Current Facility-Administered Medications  Medication Dose Route Frequency Provider Last Rate Last Dose  . 0.9 %  sodium chloride infusion  500 mL Intravenous Continuous Milus Banister, MD        REVIEW OF SYSTEMS:    10 Point review of Systems was done is negative except as noted above.  PHYSICAL EXAMINATION: ECOG PERFORMANCE STATUS: 1 - Symptomatic but completely ambulatory  . Vitals:   03/11/17 0853  BP: 123/64  Pulse: (!) 104  Resp: 20  Temp: 97.8 F (36.6 C)  SpO2: 100%   Filed Weights   03/11/17 0853  Weight: 168 lb 14.4 oz (76.6 kg)   .Body mass index is 23.56 kg/m.  GENERAL:alert, in no acute distress and comfortable SKIN: no acute rashes,  no significant lesions EYES: conjunctiva are pink and non-injected, sclera anicteric OROPHARYNX: MMM, no exudates, no oropharyngeal erythema or ulceration NECK: supple, no JVD LYMPH:  (+) 2 palpable lymphadenopathy in the cervical left lower neck and no obvious lymphadenopathy in axillary or inguinal regions that I could appreciate LUNGS: clear to auscultation b/l with normal respiratory effort HEART: regular rate & rhythm ABDOMEN:  normoactive bowel sounds , non tender, not distended. Borderline palpable splenomegaly, no overt hepatomegaly.  Extremity:  no pedal edema PSYCH: alert & oriented x 3 with fluent speech NEURO: no focal motor/sensory deficits  LABORATORY DATA:  I have reviewed the data as listed  . CBC Latest Ref Rng & Units 03/10/2017 02/27/2017 02/19/2017  WBC 4.0 - 10.3 10e3/uL 17.2(H) 2.1(L) 4.3  Hemoglobin 13.0 - 17.1 g/dL 9.7(L) 9.6(L) 10.2(L)  Hematocrit 38.4 - 49.9 % 30.5(L) 28.2(L) 31.3(L)  Platelets 140 - 400 10e3/uL 130(L) 36(L) 66(L)    . CMP Latest Ref Rng & Units 03/10/2017 02/27/2017 02/27/2017  Glucose 70 - 140 mg/dl 272(H) 171(H) -  BUN 7.0 - 26.0 mg/dL 7.1 11.5 -  Creatinine 0.7 - 1.3 mg/dL 0.8 0.8 -  Sodium 136 - 145 mEq/L 141 132(L) -  Potassium 3.5 - 5.1 mEq/L 4.1 3.7 -  Chloride 101 - 111 mmol/L - - -  CO2 22 - 29 mEq/L 27 30(H) -  Calcium 8.4 - 10.4 mg/dL 9.2 9.1 -  Total Protein 6.4 - 8.3 g/dL 6.2(L) 6.1(L) 5.4(L)  Total Bilirubin 0.20 - 1.20 mg/dL 0.50 1.50(H) -  Alkaline Phos 40 - 150 U/L 167(H) 168(H) -  AST 5 - 34 U/L 24 17 -  ALT 0 - 55 U/L 21 18 -   Component     Latest Ref Rng & Units 01/26/2017  HIV Screen 4th Generation wRfx     Non Reactive Non Reactive  Hep C Virus Ab     0.0 - 0.9 s/co ratio <0.1  LDH     125 - 245 U/L 522 (H)  Sed Rate     0 - 30 mm/hr 6  Hep B Core Ab, Tot     Negative Negative  Hepatitis B Surface Ag     Negative Negative   Uric acid 3.1      RADIOGRAPHIC STUDIES:  I have personally reviewed  the radiological images as listed and agreed with the findings in the report. Ir US Guide Vasc Access Right  Result Date: 02/17/2017 INDICATION: 61 year old male with a past history of melanoma and now matured T-cell lymphoma. He requires durable venous access for chemotherapy. EXAM: IMPLANTED PORT A CATH PLACEMENT WITH ULTRASOUND AND FLUOROSCOPIC GUIDANCE MEDICATIONS: Clindamycin 600 mg IV; The antibiotic was administered within an appropriate time interval prior to skin puncture. ANESTHESIA/SEDATION: Versed 2 mg IV; Fentanyl 100 mcg IV; Moderate Sedation Time:  20 minutes The patient was continuously monitored during the procedure by the interventional radiology nurse under my direct supervision. FLUOROSCOPY TIME:  0 minutes, 12 seconds (2 mGy) COMPLICATIONS: None immediate. PROCEDURE: The right neck and chest was prepped with chlorhexidine, and draped in the usual sterile fashion using maximum barrier technique (cap and mask, sterile gown, sterile gloves, large sterile sheet, hand hygiene and cutaneous antiseptic). Local anesthesia was attained by infiltration with 1% lidocaine with epinephrine. Ultrasound demonstrated patency of the right internal jugular vein, and this was documented with an image. Under real-time ultrasound guidance, this vein was accessed with a 21 gauge micropuncture needle and image documentation was performed. A small dermatotomy was made at the access site with an 11 scalpel. A 0.018" wire was advanced into the SVC and the access needle exchanged for a 27F micropuncture vascular sheath. The 0.018" wire was then removed and a 0.035" wire advanced into the IVC. An appropriate location for the subcutaneous reservoir was selected below the clavicle and an incision was made through the skin and underlying soft tissues. The subcutaneous tissues were then dissected using a combination of blunt and sharp surgical technique and  a pocket was formed. A single lumen power injectable portacatheter  was then tunneled through the subcutaneous tissues from the pocket to the dermatotomy and the port reservoir placed within the subcutaneous pocket. The venous access site was then serially dilated and a peel away vascular sheath placed over the wire. The wire was removed and the port catheter advanced into position under fluoroscopic guidance. The catheter tip is positioned in the cavoatrial junction. This was documented with a spot image. The portacatheter was then tested and found to flush and aspirate well. The port was flushed with saline followed by 100 units/mL heparinized saline. The pocket was then closed in two layers using first subdermal inverted interrupted absorbable sutures followed by a running subcuticular suture. The epidermis was then sealed with Dermabond. The dermatotomy at the venous access site was also closed with a single inverted subdermal suture and the epidermis sealed with Dermabond. IMPRESSION: Successful placement of a right IJ approach Power Port with ultrasound and fluoroscopic guidance. The catheter is ready for use. Signed, Criselda Peaches, MD [facility] Radiology Electronically Signed   By: Jacqulynn Cadet M.D.   On: 02/17/2017 13:06   Ir Fluoro Guide Port Insertion Right  Result Date: 02/17/2017 INDICATION: 61 year old male with a past history of melanoma and now matured T-cell lymphoma. He requires durable venous access for chemotherapy. EXAM: IMPLANTED PORT A CATH PLACEMENT WITH ULTRASOUND AND FLUOROSCOPIC GUIDANCE MEDICATIONS: Clindamycin 600 mg IV; The antibiotic was administered within an appropriate time interval prior to skin puncture. ANESTHESIA/SEDATION: Versed 2 mg IV; Fentanyl 100 mcg IV; Moderate Sedation Time:  20 minutes The patient was continuously monitored during the procedure by the interventional radiology nurse under my direct supervision. FLUOROSCOPY TIME:  0 minutes, 12 seconds (2 mGy)  COMPLICATIONS: None immediate. PROCEDURE: The right neck and chest was prepped with chlorhexidine, and draped in the usual sterile fashion using maximum barrier technique (cap and mask, sterile gown, sterile gloves, large sterile sheet, hand hygiene and cutaneous antiseptic). Local anesthesia was attained by infiltration with 1% lidocaine with epinephrine. Ultrasound demonstrated patency of the right internal jugular vein, and this was documented with an image. Under real-time ultrasound guidance, this vein was accessed with a 21 gauge micropuncture needle and image documentation was performed. A small dermatotomy was made at the access site with an 11 scalpel. A 0.018" wire was advanced into the SVC and the access needle exchanged for a 35F micropuncture vascular sheath. The 0.018" wire was then removed and a 0.035" wire advanced into the IVC. An appropriate location for the subcutaneous reservoir was selected below the clavicle and an incision was made through the skin and underlying soft tissues. The subcutaneous tissues were then dissected using a combination of blunt and sharp surgical technique and a pocket was formed. A single lumen power injectable portacatheter was then tunneled through the subcutaneous tissues from the pocket to the dermatotomy and the port reservoir placed within the subcutaneous pocket. The venous access site was then serially dilated and a peel away vascular sheath placed over the wire. The wire was removed and the port catheter advanced into position under fluoroscopic guidance. The catheter tip is positioned in the cavoatrial junction. This was documented with a spot image. The portacatheter was then tested and found to flush and aspirate well. The port was flushed with saline followed by 100 units/mL heparinized saline. The pocket was then closed in two layers using first subdermal inverted interrupted absorbable sutures followed by a running  subcuticular suture. The epidermis was  then sealed with Dermabond. The dermatotomy at the venous access site was also closed with a single inverted subdermal suture and the epidermis sealed with Dermabond. IMPRESSION: Successful placement of a right IJ approach Power Port with ultrasound and fluoroscopic guidance. The catheter is ready for use. Signed, Criselda Peaches, MD [facility] Radiology Electronically Signed   By: Jacqulynn Cadet M.D.   On: 02/17/2017 13:06    ASSESSMENT & PLAN:    Joseph Powell is a 61 y.o. 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 Splenomegaly with numerous low-attenuation splenic masses. Several small low-attenuation liver masses. The leading consideration is lymphoma, although metastatic disease with unknown primary in on the differential.  Improvement in consitutional symptoms and cervical lymphadenopathy.  #3 Acute on chronic Thrombocytopenia. Patient has has some chronic thrombocytopenia 85-125k for a few years. Platelets today are down to 36k in the setting of newly diagnosed lymphoma and treatment with R-CHOEP and from hypersplenism. PLt counts have now improved to 130k  #4 abnormal liver function tests likely related to liver involvement by tumor.Improving.  # 5 Anemia from NHL+ Chemotherapy  # 6 Mild neutropenia - resolved with Neulasta  # 7 Significant Oro-pharyngeal thrush -- causing low grade fever and change in taste -- treated aggressively with Nystatin mouthwashes and PO Fluconazole. Resolved -- continuing fluconazole prophylaxis.  Plan - overall appears to be have tolerated CHOEP relatively well.  -thrush has now resolved - continue po fluconazole for prophylaxis. -He has followed up with Ernestene Kiel, they are trying to implement smaller meals and more frequent meals into his regimen  to help with his appetite and we also discussed ways to try to increase his po intake. . -His counts have been stable and will hopefully bounce with his continued treatment. He has been mildly anemic but this has held overall, platelets have bounced up to 130k today 03/11/2017 -No significant evidence of TLS.  -he has recommended to seek immediate help for any fevers. -added Rituxan from this cycle given aberrant strong CD 20 positivity in the neoplastic T cells. -PET/CT following third cycle to reevaluate.  -does have an appointment with Dr Duanne Moron for 2nd opinion at [facility]. -Pt to fu with PCP regarding receive of insulin for high blood sugars while receiving steroids. -Instructed pt to monitor blood sugars at home   #2 History of Melanoma, 2009 -Followed by Dr. Allyson Sabal for continued skin screening   #3 Patient Active Problem List   Diagnosis Date Noted  . Counseling regarding advanced care planning and goals of care 02/12/2017  . Mature T/NK-cell lymphomas, unspecified, lymph nodes of multiple sites ([place]) 02/10/2017  . Cough variant asthma vs UACS 01/27/2017  . Dyslipidemia 08/05/2016  . Mild asthma 08/05/2016  . DJD (degenerative joint disease) 08/05/2016  . HLD (hyperlipidemia) 07/27/2016  . GERD (gastroesophageal reflux disease) 07/27/2016  . Gout 07/27/2016  . Discomfort in chest 07/27/2016  . OSA (obstructive sleep apnea) 09/19/2013  . Thrombocytopenia ([place]) 04/14/2011  . SCHATZKI'S RING 08/21/2009    -Rituxan added to D3 of each cycle starting on 03/13/2017 -continue treatment as per orders -PET/CT in 5 weeks -RTC with Dr Irene Limbo in 10-12 days for toxicity check -RTC witih Dr Irene Limbo with labs C3D1  All of the patients questions were answered with apparent satisfaction. The patient knows to call the  clinic with any problems, questions or concerns.  I spent 30 minutes counseling the patient face to face. The total time spent in the appointment was 40 minutes and more than 50% was  on counseling and direct patient cares.    Sullivan Lone MD [place] AAHIVMS [facility] Hematology/Oncology Physician [facility]  (Office):       [phone] (Work cell):  [phone] (Fax):           [phone]  03/11/2017 9:02 AM   This document serves as a record of services personally performed by Sullivan Lone, MD. It was created on his behalf by Alean Rinne, a trained medical scribe. The creation of this record is based on the scribe's personal observations and the provider's statements to them.   .I have reviewed the above documentation for accuracy and completeness, and I agree with the above.  Brunetta Genera MD MS

## 2017-03-10 ENCOUNTER — Other Ambulatory Visit (HOSPITAL_BASED_OUTPATIENT_CLINIC_OR_DEPARTMENT_OTHER): Payer: BLUE CROSS/BLUE SHIELD

## 2017-03-10 ENCOUNTER — Encounter: Payer: Self-pay | Admitting: Internal Medicine

## 2017-03-10 ENCOUNTER — Other Ambulatory Visit: Payer: Self-pay | Admitting: Hematology

## 2017-03-10 ENCOUNTER — Ambulatory Visit: Payer: BLUE CROSS/BLUE SHIELD | Admitting: Internal Medicine

## 2017-03-10 ENCOUNTER — Telehealth: Payer: Self-pay

## 2017-03-10 VITALS — BP 112/60 | HR 97

## 2017-03-10 DIAGNOSIS — R634 Abnormal weight loss: Secondary | ICD-10-CM

## 2017-03-10 DIAGNOSIS — C8442 Peripheral T-cell lymphoma, not classified, intrathoracic lymph nodes: Secondary | ICD-10-CM

## 2017-03-10 DIAGNOSIS — J45991 Cough variant asthma: Secondary | ICD-10-CM

## 2017-03-10 DIAGNOSIS — C8440 Peripheral T-cell lymphoma, not classified, unspecified site: Secondary | ICD-10-CM | POA: Diagnosis not present

## 2017-03-10 DIAGNOSIS — D696 Thrombocytopenia, unspecified: Secondary | ICD-10-CM

## 2017-03-10 LAB — COMPREHENSIVE METABOLIC PANEL
ALBUMIN: 3.2 g/dL — AB (ref 3.5–5.0)
ALK PHOS: 167 U/L — AB (ref 40–150)
ALT: 21 U/L (ref 0–55)
AST: 24 U/L (ref 5–34)
Anion Gap: 8 mEq/L (ref 3–11)
BUN: 7.1 mg/dL (ref 7.0–26.0)
CALCIUM: 9.2 mg/dL (ref 8.4–10.4)
CHLORIDE: 105 meq/L (ref 98–109)
CO2: 27 mEq/L (ref 22–29)
Creatinine: 0.8 mg/dL (ref 0.7–1.3)
Glucose: 272 mg/dl — ABNORMAL HIGH (ref 70–140)
POTASSIUM: 4.1 meq/L (ref 3.5–5.1)
SODIUM: 141 meq/L (ref 136–145)
Total Bilirubin: 0.5 mg/dL (ref 0.20–1.20)
Total Protein: 6.2 g/dL — ABNORMAL LOW (ref 6.4–8.3)

## 2017-03-10 LAB — CBC & DIFF AND RETIC
BASO%: 0.4 % (ref 0.0–2.0)
Basophils Absolute: 0.1 10*3/uL (ref 0.0–0.1)
EOS%: 0.2 % (ref 0.0–7.0)
Eosinophils Absolute: 0 10*3/uL (ref 0.0–0.5)
HCT: 30.5 % — ABNORMAL LOW (ref 38.4–49.9)
HGB: 9.7 g/dL — ABNORMAL LOW (ref 13.0–17.1)
IMMATURE RETIC FRACT: 15.7 % — AB (ref 3.00–10.60)
LYMPH#: 3 10*3/uL (ref 0.9–3.3)
LYMPH%: 17.6 % (ref 14.0–49.0)
MCH: 28.4 pg (ref 27.2–33.4)
MCHC: 31.8 g/dL — ABNORMAL LOW (ref 32.0–36.0)
MCV: 89.2 fL (ref 79.3–98.0)
MONO#: 1.4 10*3/uL — AB (ref 0.1–0.9)
MONO%: 7.9 % (ref 0.0–14.0)
NEUT%: 73.9 % (ref 39.0–75.0)
NEUTROS ABS: 12.7 10*3/uL — AB (ref 1.5–6.5)
Platelets: 130 10*3/uL — ABNORMAL LOW (ref 140–400)
RBC: 3.42 10*6/uL — AB (ref 4.20–5.82)
RDW: 21.1 % — ABNORMAL HIGH (ref 11.0–14.6)
RETIC %: 5.62 % — AB (ref 0.80–1.80)
RETIC CT ABS: 192.2 10*3/uL — AB (ref 34.80–93.90)
WBC: 17.2 10*3/uL — AB (ref 4.0–10.3)

## 2017-03-10 LAB — MAGNESIUM: MAGNESIUM: 2.4 mg/dL (ref 1.5–2.5)

## 2017-03-10 LAB — LACTATE DEHYDROGENASE: LDH: 267 U/L — ABNORMAL HIGH (ref 125–245)

## 2017-03-10 NOTE — Progress Notes (Signed)
Subjective:     Patient ID: Joseph Powell, male   DOB: [birth year]     MRN: [id number]    Brief patient profile:  33 yowm never smoker never problems with allergies/ asthma onset of pnds seasonally in 40s better on clarinex but eventually started needed year round but even then  gradually worse sense of pnds then daily cough x 2015 referred to pulmonary clinic 01/27/2017 by Dr   Hulan Fess with recently detected widespread adenopathy c/w T cell lymphoma.     History of Present Illness  01/27/2017 1st [facility] office visit/ Sonia Stickels   Chief Complaint  Patient presents with  . Pulmonary Consult    Referred by Dr. Hulan Fess. Pt c/o cough off and on for the past 3 years. He is not producing any sputum at this point- just finished pred taper and round of abx. He states that when he tries to take in a deep breath it triggers him to cough.  He has an albuterol inhaler that he typically uses 3 x per month.   chronic pnds x decades then worse cough esp spring and fall much better with prednisone which tapered off one day prior to OV  But s excess/ purulent sputum or mucus plugs   ppi seemed to help some with neg EGD 12/05/16 and continuing to take ppi but not always 30 m ac  Not limited by breathing from desired activities but senses can't take a deep breath s provoking cough which is worse with ex   ? Some better with saba but rarely uses  rec Continue protonix 40 mg Take 30-60 min before first meal of the day  GERD   Dulera 100 Take 2 puffs first thing in am and then another 2 puffs about 12 hours later.  Work on inhaler technique:   If still can't stop coughing > tessalon 200 mg  Up to three times daily as needed  For drainage / throat tickle try instead of clarinex take CHLORPHENIRAMINE  4 mg - take one every 4 hours as needed - available over the counter- may cause drowsiness so start with just a bedtime dose or two and see how you tolerate it before trying in daytime    03/10/2017   f/u ov/Aubreyanna Dorrough re:  Cough variant asthma improved on dulera 100 2bid  Chief Complaint  Patient presents with  . Follow-up    Cough is "99% better". Still clearing his throat often.    no problem with noct cough  Exercise > sob = MMRC1 = can walk nl pace, flat grade, can't hurry or go uphills or steps s sob   No additional changes in resp symptoms  when on pred for chemo for lymphoma x one week cycles   No obvious day to day or daytime variability or assoc excess/ purulent sputum or mucus plugs or hemoptysis or cp or chest tightness, subjective wheeze or overt sinus or hb symptoms. No unusual exp hx or h/o childhood pna/ asthma or knowledge of premature birth.  Sleeping ok flat without nocturnal  or early am exacerbation  of respiratory  c/o's or need for noct saba. Also denies any obvious fluctuation of symptoms with weather or environmental changes or other aggravating or alleviating factors except as outlined above   Current Allergies, Complete Past Medical History, Past Surgical History, Family History, and Social History were reviewed in [employer] record.  ROS  The following are not active complaints unless bolded Hoarseness, sore throat, dysphagia,  dental problems, itching, sneezing,  nasal congestion or discharge of excess mucus or purulent secretions, ear ache,   fever, chills, sweats, unintended wt loss or wt gain, classically pleuritic or exertional cp,  orthopnea pnd or leg swelling, presyncope, palpitations, abdominal pain, anorexia, nausea, vomiting, diarrhea  or change in bowel habits or change in bladder habits, change in stools or change in urine, dysuria, hematuria,  rash, arthralgias, visual complaints, headache, numbness, weakness or ataxia or problems with walking or coordination,  change in mood/affect or memory.        Current Meds  Medication Sig  . albuterol (PROVENTIL HFA;VENTOLIN HFA) 108 (90 Base) MCG/ACT inhaler Inhale 1-2 puffs into the lungs  every 6 (six) hours as needed for wheezing or shortness of breath.  . allopurinol (ZYLOPRIM) 100 MG tablet Take 100 mg by mouth 2 (two) times daily.  . benzonatate (TESSALON) 200 MG capsule Take 1 capsule (200 mg total) by mouth 3 (three) times daily as needed for cough.  . chlorpheniramine (CHLOR-TRIMETON) 4 MG tablet Take 4 mg every 4 (four) hours as needed by mouth for allergies.  Marland Kitchen dronabinol (MARINOL) 5 MG capsule Take 1 capsule (5 mg total) by mouth 2 (two) times daily before a meal.  . fluconazole (DIFLUCAN) 200 MG tablet Take 1 tablet (200 mg total) by mouth daily. Hold 24h prior to chemotherapy  . lactulose (CHRONULAC) 10 GM/15ML solution Take 30 mLs (20 g total) by mouth 3 (three) times daily as needed for moderate constipation.  . lidocaine-prilocaine (EMLA) cream Apply to affected area once  . LORazepam (ATIVAN) 0.5 MG tablet Take 1 tablet (0.5 mg total) by mouth every 8 (eight) hours as needed for anxiety.  . mometasone-formoterol (DULERA) 100-5 MCG/ACT AERO Inhale 2 puffs into the lungs 2 (two) times daily.  Marland Kitchen nystatin (MYCOSTATIN) 100000 UNIT/ML suspension Take 5 mLs (500,000 Units total) by mouth 4 (four) times daily.  . ondansetron (ZOFRAN) 8 MG tablet Take 1 tablet (8 mg total) by mouth 2 (two) times daily as needed for refractory nausea / vomiting. Start on day 3 after each chemotherapy  . pantoprazole (PROTONIX) 40 MG tablet Take 40 mg by mouth daily.   . pravastatin (PRAVACHOL) 40 MG tablet Take 40 mg by mouth daily.    . predniSONE (DELTASONE) 20 MG tablet Take 3 tablets (60 mg total) by mouth daily. Take on days 1-5 of chemotherapy.  . prochlorperazine (COMPAZINE) 10 MG tablet Take 1 tablet (10 mg total) by mouth every 6 (six) hours as needed (Nausea or vomiting).              Objective:   Physical Exam    amb wm occ throat clearing            01/27/17 184 lb (83.5 kg)  01/26/17 183 lb 11.2 oz (83.3 kg)  01/20/17 182 lb (82.6 kg)    Vital signs reviewed  - Note  on arrival 02 sats  98% on RA     HEENT: nl dentition, turbinates bilaterally, and oropharynx. Nl external ear canals without cough reflex     NECK :  without JVD/Nodes/TM/ nl carotid upstrokes bilaterally   LUNGS: no acc muscle use,  Nl contour chest which is clear to A and P bilaterally with no cough on insp/exp   CV:  RRR  no s3 or murmur or increase in P2, and no edema   ABD:  soft and nontender with nl inspiratory excursion in the supine position. No bruits or organomegaly appreciated,  bowel sounds nl  MS:  Nl gait/ ext warm without deformities, calf tenderness, cyanosis or clubbing No obvious joint restrictions   SKIN: warm and dry without lesions          Assessment:

## 2017-03-10 NOTE — Patient Instructions (Signed)
Plan A = Automatic = dulera 100 Take 2 puffs first thing in am and then another 2 puffs about 12 hours later.     Plan B = Backup Only use your albuterol as a rescue medication to be used if you can't catch your breath by resting or doing a relaxed purse lip breathing pattern.  - The less you use it, the better it will work when you need it. - Ok to use the inhaler up to 2 puffs  every 4 hours if you must but call for appointment if use goes up over your usual need - Don't leave home without it !!  (think of it like the spare tire for your car)    Try to suppress cough with either hard rock candy or Tessalon to prevent throat clearing   For drainage / throat tickle try take CHLORPHENIRAMINE  4 mg - take one every 4 hours as needed - available over the counter- may cause drowsiness so start with just a bedtime dose or two and see how you tolerate it before trying in daytime    Please schedule a follow up visit in 6 months but call sooner if needed

## 2017-03-10 NOTE — Telephone Encounter (Signed)
Scheduling message sent for first-time rituxan to be added to etoposide treatment in infusion on 11/8 or 11/9. Both days capped. Email sent to [facility]. In scheduling, Meade Maw, DON and Tonye Royalty, RN.

## 2017-03-11 ENCOUNTER — Telehealth: Payer: Self-pay | Admitting: Hematology

## 2017-03-11 ENCOUNTER — Telehealth: Payer: Self-pay

## 2017-03-11 ENCOUNTER — Ambulatory Visit (HOSPITAL_BASED_OUTPATIENT_CLINIC_OR_DEPARTMENT_OTHER): Payer: BLUE CROSS/BLUE SHIELD | Admitting: Hematology

## 2017-03-11 ENCOUNTER — Encounter: Payer: Self-pay | Admitting: Internal Medicine

## 2017-03-11 ENCOUNTER — Ambulatory Visit (HOSPITAL_BASED_OUTPATIENT_CLINIC_OR_DEPARTMENT_OTHER): Payer: BLUE CROSS/BLUE SHIELD

## 2017-03-11 VITALS — BP 123/64 | HR 104 | Temp 97.8°F | Resp 20 | Ht 71.0 in | Wt 168.9 lb

## 2017-03-11 VITALS — HR 89

## 2017-03-11 DIAGNOSIS — R161 Splenomegaly, not elsewhere classified: Secondary | ICD-10-CM

## 2017-03-11 DIAGNOSIS — C8442 Peripheral T-cell lymphoma, not classified, intrathoracic lymph nodes: Secondary | ICD-10-CM | POA: Insufficient documentation

## 2017-03-11 DIAGNOSIS — C8440 Peripheral T-cell lymphoma, not classified, unspecified site: Secondary | ICD-10-CM

## 2017-03-11 DIAGNOSIS — Z5111 Encounter for antineoplastic chemotherapy: Secondary | ICD-10-CM

## 2017-03-11 DIAGNOSIS — D6481 Anemia due to antineoplastic chemotherapy: Secondary | ICD-10-CM

## 2017-03-11 DIAGNOSIS — Z7189 Other specified counseling: Secondary | ICD-10-CM

## 2017-03-11 DIAGNOSIS — Z85828 Personal history of other malignant neoplasm of skin: Secondary | ICD-10-CM

## 2017-03-11 DIAGNOSIS — R7989 Other specified abnormal findings of blood chemistry: Secondary | ICD-10-CM | POA: Diagnosis not present

## 2017-03-11 DIAGNOSIS — C8498 Mature T/NK-cell lymphomas, unspecified, lymph nodes of multiple sites: Secondary | ICD-10-CM

## 2017-03-11 DIAGNOSIS — D696 Thrombocytopenia, unspecified: Secondary | ICD-10-CM

## 2017-03-11 DIAGNOSIS — D630 Anemia in neoplastic disease: Secondary | ICD-10-CM

## 2017-03-11 DIAGNOSIS — K769 Liver disease, unspecified: Secondary | ICD-10-CM

## 2017-03-11 DIAGNOSIS — B370 Candidal stomatitis: Secondary | ICD-10-CM | POA: Diagnosis not present

## 2017-03-11 DIAGNOSIS — R61 Generalized hyperhidrosis: Secondary | ICD-10-CM

## 2017-03-11 MED ORDER — SODIUM CHLORIDE 0.9% FLUSH
10.0000 mL | INTRAVENOUS | Status: DC | PRN
  Administered 2017-03-11: 10 mL
  Filled 2017-03-11: qty 10

## 2017-03-11 MED ORDER — SODIUM CHLORIDE 0.9 % IV SOLN
750.0000 mg/m2 | Freq: Once | INTRAVENOUS | Status: AC
  Administered 2017-03-11: 1540 mg via INTRAVENOUS
  Filled 2017-03-11: qty 77

## 2017-03-11 MED ORDER — DOXORUBICIN HCL CHEMO IV INJECTION 2 MG/ML
50.0000 mg/m2 | Freq: Once | INTRAVENOUS | Status: AC
  Administered 2017-03-11: 102 mg via INTRAVENOUS
  Filled 2017-03-11: qty 51

## 2017-03-11 MED ORDER — HEPARIN SOD (PORK) LOCK FLUSH 100 UNIT/ML IV SOLN
500.0000 [IU] | Freq: Once | INTRAVENOUS | Status: AC | PRN
  Administered 2017-03-11: 500 [IU]
  Filled 2017-03-11: qty 5

## 2017-03-11 MED ORDER — PALONOSETRON HCL INJECTION 0.25 MG/5ML
0.2500 mg | Freq: Once | INTRAVENOUS | Status: AC
  Administered 2017-03-11: 0.25 mg via INTRAVENOUS

## 2017-03-11 MED ORDER — ETOPOSIDE CHEMO INJECTION 1 GM/50ML
99.0000 mg/m2 | Freq: Once | INTRAVENOUS | Status: AC
  Administered 2017-03-11: 200 mg via INTRAVENOUS
  Filled 2017-03-11: qty 10

## 2017-03-11 MED ORDER — SODIUM CHLORIDE 0.9 % IV SOLN
Freq: Once | INTRAVENOUS | Status: AC
  Administered 2017-03-11: 11:00:00 via INTRAVENOUS
  Filled 2017-03-11: qty 5

## 2017-03-11 MED ORDER — LORAZEPAM 0.5 MG PO TABS: 0.5000 mg | ORAL_TABLET | Freq: Three times a day (TID) | ORAL | 0 refills | Status: DC | PRN

## 2017-03-11 MED ORDER — SODIUM CHLORIDE 0.9 % IV SOLN
Freq: Once | INTRAVENOUS | Status: AC
  Administered 2017-03-11: 10:00:00 via INTRAVENOUS

## 2017-03-11 MED ORDER — PALONOSETRON HCL INJECTION 0.25 MG/5ML
INTRAVENOUS | Status: AC
  Filled 2017-03-11: qty 5

## 2017-03-11 MED ORDER — VINCRISTINE SULFATE CHEMO INJECTION 1 MG/ML
2.0000 mg | Freq: Once | INTRAVENOUS | Status: AC
  Administered 2017-03-11: 2 mg via INTRAVENOUS
  Filled 2017-03-11: qty 2

## 2017-03-11 NOTE — Assessment & Plan Note (Signed)
CT abd 01/21/17 with nonspecific and very mild tree in bud pattern 01/27/2017  After extensive coaching HFA effectiveness =    90% > try duler 100 2bid   - Spirometry 03/10/2017  FEV1 3.29 (89%)  Ratio 86 p am Dulera 100 x 2 pffs  - 03/10/2017  Walked RA x 3 laps @ 185 ft each stopped due to  End of study, fast pace, no sob or desat      All goals of chronic asthma control met including optimal function and elimination of symptoms with minimal need for rescue therapy.  Contingencies discussed in full including contacting this office immediately if not controlling the symptoms using the rule of two's.    Each maintenance medication was reviewed in detail including most importantly the difference between maintenance and as needed and under what circumstances the prns are to be used.  Please see AVS for specific  Instructions which are unique to this visit and I personally typed out  which were reviewed in detail in writing with the patient and a copy provided.

## 2017-03-11 NOTE — Telephone Encounter (Signed)
Email sent to Aleen Sells Chrismon requesting authorization for Onpro beginning 11/9.

## 2017-03-11 NOTE — Telephone Encounter (Signed)
Scheduled appt per 11/7 los - added additional appts - treatment already scheduled - gave patient calender .

## 2017-03-11 NOTE — Patient Instructions (Signed)
[place] Discharge Instructions for Patients Receiving Chemotherapy  Today you received the following chemotherapy agents Etoposide, Adriamycin, Vincristine, Cytoxan To help prevent nausea and vomiting after your treatment, we encourage you to take your nausea medication as prescribed.   If you develop nausea and vomiting that is not controlled by your nausea medication, call the clinic.   BELOW ARE SYMPTOMS THAT SHOULD BE REPORTED IMMEDIATELY:  *FEVER GREATER THAN 100.5 F  *CHILLS WITH OR WITHOUT FEVER  NAUSEA AND VOMITING THAT IS NOT CONTROLLED WITH YOUR NAUSEA MEDICATION  *UNUSUAL SHORTNESS OF BREATH  *UNUSUAL BRUISING OR BLEEDING  TENDERNESS IN MOUTH AND THROAT WITH OR WITHOUT PRESENCE OF ULCERS  *URINARY PROBLEMS  *BOWEL PROBLEMS  UNUSUAL RASH Items with * indicate a potential emergency and should be followed up as soon as possible.  Feel free to call the clinic should you have any questions or concerns. The clinic phone number is [phone].  Please show the [place] at check-in to the Emergency Department and triage nurse.

## 2017-03-12 ENCOUNTER — Ambulatory Visit (HOSPITAL_BASED_OUTPATIENT_CLINIC_OR_DEPARTMENT_OTHER): Payer: BLUE CROSS/BLUE SHIELD

## 2017-03-12 VITALS — BP 131/70 | HR 84 | Temp 98.0°F | Resp 18

## 2017-03-12 DIAGNOSIS — C8498 Mature T/NK-cell lymphomas, unspecified, lymph nodes of multiple sites: Secondary | ICD-10-CM

## 2017-03-12 DIAGNOSIS — Z5111 Encounter for antineoplastic chemotherapy: Secondary | ICD-10-CM | POA: Diagnosis not present

## 2017-03-12 DIAGNOSIS — C8440 Peripheral T-cell lymphoma, not classified, unspecified site: Secondary | ICD-10-CM | POA: Diagnosis not present

## 2017-03-12 DIAGNOSIS — C8442 Peripheral T-cell lymphoma, not classified, intrathoracic lymph nodes: Secondary | ICD-10-CM

## 2017-03-12 DIAGNOSIS — Z7189 Other specified counseling: Secondary | ICD-10-CM

## 2017-03-12 MED ORDER — HEPARIN SOD (PORK) LOCK FLUSH 100 UNIT/ML IV SOLN
500.0000 [IU] | Freq: Once | INTRAVENOUS | Status: AC | PRN
  Administered 2017-03-12: 500 [IU]
  Filled 2017-03-12: qty 5

## 2017-03-12 MED ORDER — SODIUM CHLORIDE 0.9% FLUSH
10.0000 mL | INTRAVENOUS | Status: DC | PRN
  Administered 2017-03-12: 10 mL
  Filled 2017-03-12: qty 10

## 2017-03-12 MED ORDER — SODIUM CHLORIDE 0.9 % IV SOLN
98.0000 mg/m2 | Freq: Once | INTRAVENOUS | Status: AC
  Administered 2017-03-12: 200 mg via INTRAVENOUS
  Filled 2017-03-12: qty 10

## 2017-03-12 MED ORDER — PROCHLORPERAZINE EDISYLATE 5 MG/ML IJ SOLN
INTRAMUSCULAR | Status: AC
  Filled 2017-03-12: qty 2

## 2017-03-12 MED ORDER — PROCHLORPERAZINE EDISYLATE 5 MG/ML IJ SOLN
10.0000 mg | Freq: Once | INTRAMUSCULAR | Status: AC
  Administered 2017-03-12: 10 mg via INTRAVENOUS

## 2017-03-12 NOTE — Patient Instructions (Signed)
[facility] Discharge Instructions for Patients Receiving Chemotherapy  Today you received the following chemotherapy agent :  Etoposide.  To help prevent nausea and vomiting after your treatment, we encourage you to take your nausea medication as prescribed.   If you develop nausea and vomiting that is not controlled by your nausea medication, call the clinic.   BELOW ARE SYMPTOMS THAT SHOULD BE REPORTED IMMEDIATELY:  *FEVER GREATER THAN 100.5 F  *CHILLS WITH OR WITHOUT FEVER  NAUSEA AND VOMITING THAT IS NOT CONTROLLED WITH YOUR NAUSEA MEDICATION  *UNUSUAL SHORTNESS OF BREATH  *UNUSUAL BRUISING OR BLEEDING  TENDERNESS IN MOUTH AND THROAT WITH OR WITHOUT PRESENCE OF ULCERS  *URINARY PROBLEMS  *BOWEL PROBLEMS  UNUSUAL RASH Items with * indicate a potential emergency and should be followed up as soon as possible.  Feel free to call the clinic should you have any questions or concerns. The clinic phone number is [phone].  Please show the CHEMO ALERT CARD at check-in to the Emergency Department and triage nurse.   

## 2017-03-13 ENCOUNTER — Ambulatory Visit (HOSPITAL_BASED_OUTPATIENT_CLINIC_OR_DEPARTMENT_OTHER): Payer: BLUE CROSS/BLUE SHIELD

## 2017-03-13 VITALS — BP 130/79 | HR 78 | Temp 97.8°F | Resp 17

## 2017-03-13 DIAGNOSIS — C8440 Peripheral T-cell lymphoma, not classified, unspecified site: Secondary | ICD-10-CM

## 2017-03-13 DIAGNOSIS — Z5112 Encounter for antineoplastic immunotherapy: Secondary | ICD-10-CM | POA: Diagnosis not present

## 2017-03-13 DIAGNOSIS — C8442 Peripheral T-cell lymphoma, not classified, intrathoracic lymph nodes: Secondary | ICD-10-CM

## 2017-03-13 DIAGNOSIS — Z5111 Encounter for antineoplastic chemotherapy: Secondary | ICD-10-CM

## 2017-03-13 DIAGNOSIS — Z7189 Other specified counseling: Secondary | ICD-10-CM

## 2017-03-13 DIAGNOSIS — C8498 Mature T/NK-cell lymphomas, unspecified, lymph nodes of multiple sites: Secondary | ICD-10-CM

## 2017-03-13 MED ORDER — SODIUM CHLORIDE 0.9 % IV SOLN
375.0000 mg/m2 | Freq: Once | INTRAVENOUS | Status: AC
  Administered 2017-03-13: 700 mg via INTRAVENOUS
  Filled 2017-03-13: qty 50

## 2017-03-13 MED ORDER — PROCHLORPERAZINE EDISYLATE 5 MG/ML IJ SOLN
INTRAMUSCULAR | Status: AC
  Filled 2017-03-13: qty 2

## 2017-03-13 MED ORDER — ETOPOSIDE CHEMO INJECTION 1 GM/50ML
99.0000 mg/m2 | Freq: Once | INTRAVENOUS | Status: AC
  Administered 2017-03-13: 200 mg via INTRAVENOUS
  Filled 2017-03-13: qty 10

## 2017-03-13 MED ORDER — PEGFILGRASTIM 6 MG/0.6ML ~~LOC~~ PSKT
6.0000 mg | PREFILLED_SYRINGE | Freq: Once | SUBCUTANEOUS | Status: AC
  Administered 2017-03-13: 6 mg via SUBCUTANEOUS
  Filled 2017-03-13: qty 0.6

## 2017-03-13 MED ORDER — HEPARIN SOD (PORK) LOCK FLUSH 100 UNIT/ML IV SOLN
500.0000 [IU] | Freq: Once | INTRAVENOUS | Status: AC | PRN
  Administered 2017-03-13: 500 [IU]
  Filled 2017-03-13: qty 5

## 2017-03-13 MED ORDER — PROCHLORPERAZINE MALEATE 10 MG PO TABS
ORAL_TABLET | ORAL | Status: AC
  Filled 2017-03-13: qty 1

## 2017-03-13 MED ORDER — DIPHENHYDRAMINE HCL 25 MG PO CAPS
ORAL_CAPSULE | ORAL | Status: AC
  Filled 2017-03-13: qty 2

## 2017-03-13 MED ORDER — SODIUM CHLORIDE 0.9 % IV SOLN
Freq: Once | INTRAVENOUS | Status: AC
  Administered 2017-03-13: 08:00:00 via INTRAVENOUS

## 2017-03-13 MED ORDER — DIPHENHYDRAMINE HCL 25 MG PO CAPS
50.0000 mg | ORAL_CAPSULE | Freq: Once | ORAL | Status: AC
  Administered 2017-03-13: 50 mg via ORAL

## 2017-03-13 MED ORDER — ACETAMINOPHEN 325 MG PO TABS
650.0000 mg | ORAL_TABLET | Freq: Once | ORAL | Status: AC
  Administered 2017-03-13: 650 mg via ORAL

## 2017-03-13 MED ORDER — SODIUM CHLORIDE 0.9% FLUSH
10.0000 mL | INTRAVENOUS | Status: DC | PRN
  Administered 2017-03-13: 10 mL
  Filled 2017-03-13: qty 10

## 2017-03-13 MED ORDER — PROCHLORPERAZINE EDISYLATE 5 MG/ML IJ SOLN
10.0000 mg | Freq: Once | INTRAMUSCULAR | Status: AC
  Administered 2017-03-13: 10 mg via INTRAVENOUS

## 2017-03-13 MED ORDER — ACETAMINOPHEN 325 MG PO TABS
ORAL_TABLET | ORAL | Status: AC
  Filled 2017-03-13: qty 2

## 2017-03-13 NOTE — Patient Instructions (Signed)
[place] Discharge Instructions for Patients Receiving Chemotherapy  Today you received the following chemotherapy agents: Etopodside (Vepesid) and Rifuximab (Rituxan)  To help prevent nausea and vomiting after your treatment, we encourage you to take your nausea medication as prescribed.  If you develop nausea and vomiting that is not controlled by your nausea medication, call the clinic.   BELOW ARE SYMPTOMS THAT SHOULD BE REPORTED IMMEDIATELY:  *FEVER GREATER THAN 100.5 F  *CHILLS WITH OR WITHOUT FEVER  NAUSEA AND VOMITING THAT IS NOT CONTROLLED WITH YOUR NAUSEA MEDICATION  *UNUSUAL SHORTNESS OF BREATH  *UNUSUAL BRUISING OR BLEEDING  TENDERNESS IN MOUTH AND THROAT WITH OR WITHOUT PRESENCE OF ULCERS  *URINARY PROBLEMS  *BOWEL PROBLEMS  UNUSUAL RASH Items with * indicate a potential emergency and should be followed up as soon as possible.  Feel free to call the clinic should you have any questions or concerns. The clinic phone number is [phone].  Please show the [place] at check-in to the Emergency Department and triage nurse.    Rituximab injection What is this medicine? RITUXIMAB (ri TUX i mab) is a monoclonal antibody. It is used to treat certain types of cancer like non-Hodgkin lymphoma and chronic lymphocytic leukemia. It is also used to treat rheumatoid arthritis, granulomatosis with polyangiitis (or Wegener's granulomatosis), and microscopic polyangiitis. This medicine may be used for other purposes; ask your health care provider or pharmacist if you have questions. COMMON BRAND NAME(S): Rituxan What should I tell my health care provider before I take this medicine? They need to know if you have any of these conditions: -heart disease -infection (especially a virus infection such as hepatitis B, chickenpox, cold sores, or herpes) -immune system problems -irregular heartbeat -kidney disease -lung or breathing disease, like  asthma -recently received or scheduled to receive a vaccine -an unusual or allergic reaction to rituximab, mouse proteins, other medicines, foods, dyes, or preservatives -pregnant or trying to get pregnant -breast-feeding How should I use this medicine? This medicine is for infusion into a vein. It is administered in a hospital or clinic by a specially trained health care professional. A special MedGuide will be given to you by the pharmacist with each prescription and refill. Be sure to read this information carefully each time. Talk to your pediatrician regarding the use of this medicine in children. This medicine is not approved for use in children. Overdosage: If you think you have taken too much of this medicine contact a poison control center or emergency room at once. NOTE: This medicine is only for you. Do not share this medicine with others. What if I miss a dose? It is important not to miss a dose. Call your doctor or health care professional if you are unable to keep an appointment. What may interact with this medicine? -cisplatin -other medicines for arthritis like disease modifying antirheumatic drugs or tumor necrosis factor inhibitors -live virus vaccines This list may not describe all possible interactions. Give your health care provider a list of all the medicines, herbs, non-prescription drugs, or dietary supplements you use. Also tell them if you smoke, drink alcohol, or use illegal drugs. Some items may interact with your medicine. What should I watch for while using this medicine? Your condition will be monitored carefully while you are receiving this medicine. You may need blood work done while you are taking this medicine. This medicine can cause serious allergic reactions. To reduce your risk you may need to take medicine before treatment with  this medicine. Take your medicine as directed. In some patients, this medicine may cause a serious brain infection that may cause  death. If you have any problems seeing, thinking, speaking, walking, or standing, tell your doctor right away. If you cannot reach your doctor, urgently seek other source of medical care. Call your doctor or health care professional for advice if you get a fever, chills or sore throat, or other symptoms of a cold or flu. Do not treat yourself. This drug decreases your body's ability to fight infections. Try to avoid being around people who are sick. Do not become pregnant while taking this medicine or for 12 months after stopping it. Women should inform their doctor if they wish to become pregnant or think they might be pregnant. There is a potential for serious side effects to an unborn child. Talk to your health care professional or pharmacist for more information. What side effects may I notice from receiving this medicine? Side effects that you should report to your doctor or health care professional as soon as possible: -breathing problems -chest pain -dizziness or feeling faint -fast, irregular heartbeat -low blood counts - this medicine may decrease the number of white blood cells, red blood cells and platelets. You may be at increased risk for infections and bleeding. -mouth sores -redness, blistering, peeling or loosening of the skin, including inside the mouth (this can be added for any serious or exfoliative rash that could lead to hospitalization) -signs of infection - fever or chills, cough, sore throat, pain or difficulty passing urine -signs and symptoms of kidney injury like trouble passing urine or change in the amount of urine -signs and symptoms of liver injury like dark yellow or brown urine; general ill feeling or flu-like symptoms; light-colored stools; loss of appetite; nausea; right upper belly pain; unusually weak or tired; yellowing of the eyes or skin -stomach pain -vomiting Side effects that usually do not require medical attention (report to your doctor or health care  professional if they continue or are bothersome): -headache -joint pain -muscle cramps or muscle pain This list may not describe all possible side effects. Call your doctor for medical advice about side effects. You may report side effects to FDA at 1-800-FDA-1088. Where should I keep my medicine? This drug is given in a hospital or clinic and will not be stored at home. NOTE: This sheet is a summary. It may not cover all possible information. If you have questions about this medicine, talk to your doctor, pharmacist, or health care provider.  2018 Elsevier/Gold Standard (2015-11-28 15:28:09)

## 2017-03-14 ENCOUNTER — Ambulatory Visit: Payer: BLUE CROSS/BLUE SHIELD

## 2017-03-17 ENCOUNTER — Other Ambulatory Visit: Payer: Self-pay | Admitting: Hematology

## 2017-03-17 DIAGNOSIS — C8448 Peripheral T-cell lymphoma, not classified, lymph nodes of multiple sites: Secondary | ICD-10-CM | POA: Diagnosis not present

## 2017-03-17 MED ORDER — TRAZODONE HCL 50 MG PO TABS: 50.0000 mg | ORAL_TABLET | Freq: Every day | ORAL | 0 refills | Status: DC

## 2017-03-19 DIAGNOSIS — C8448 Peripheral T-cell lymphoma, not classified, lymph nodes of multiple sites: Secondary | ICD-10-CM | POA: Diagnosis not present

## 2017-03-20 ENCOUNTER — Other Ambulatory Visit (HOSPITAL_BASED_OUTPATIENT_CLINIC_OR_DEPARTMENT_OTHER): Payer: BLUE CROSS/BLUE SHIELD

## 2017-03-20 ENCOUNTER — Ambulatory Visit: Payer: BLUE CROSS/BLUE SHIELD | Admitting: Hematology

## 2017-03-20 ENCOUNTER — Encounter: Payer: Self-pay | Admitting: Hematology

## 2017-03-20 VITALS — BP 116/63 | HR 114 | Temp 98.2°F | Resp 18 | Ht 71.0 in | Wt 172.0 lb

## 2017-03-20 DIAGNOSIS — C8442 Peripheral T-cell lymphoma, not classified, intrathoracic lymph nodes: Secondary | ICD-10-CM

## 2017-03-20 DIAGNOSIS — K769 Liver disease, unspecified: Secondary | ICD-10-CM | POA: Diagnosis not present

## 2017-03-20 DIAGNOSIS — R7989 Other specified abnormal findings of blood chemistry: Secondary | ICD-10-CM | POA: Diagnosis not present

## 2017-03-20 DIAGNOSIS — Z85828 Personal history of other malignant neoplasm of skin: Secondary | ICD-10-CM | POA: Diagnosis not present

## 2017-03-20 DIAGNOSIS — C8440 Peripheral T-cell lymphoma, not classified, unspecified site: Secondary | ICD-10-CM

## 2017-03-20 DIAGNOSIS — D630 Anemia in neoplastic disease: Secondary | ICD-10-CM | POA: Diagnosis not present

## 2017-03-20 DIAGNOSIS — E440 Moderate protein-calorie malnutrition: Secondary | ICD-10-CM

## 2017-03-20 DIAGNOSIS — D649 Anemia, unspecified: Secondary | ICD-10-CM

## 2017-03-20 DIAGNOSIS — D6481 Anemia due to antineoplastic chemotherapy: Secondary | ICD-10-CM

## 2017-03-20 DIAGNOSIS — D696 Thrombocytopenia, unspecified: Secondary | ICD-10-CM

## 2017-03-20 DIAGNOSIS — C161 Malignant neoplasm of fundus of stomach: Secondary | ICD-10-CM

## 2017-03-20 LAB — CBC & DIFF AND RETIC
BASO%: 0.5 % (ref 0.0–2.0)
Basophils Absolute: 0 10*3/uL (ref 0.0–0.1)
EOS%: 0.1 % (ref 0.0–7.0)
Eosinophils Absolute: 0 10*3/uL (ref 0.0–0.5)
HEMATOCRIT: 24.1 % — AB (ref 38.4–49.9)
HGB: 7.9 g/dL — ABNORMAL LOW (ref 13.0–17.1)
Immature Retic Fract: 56.6 % — ABNORMAL HIGH (ref 3.00–10.60)
LYMPH%: 15.1 % (ref 14.0–49.0)
MCH: 28.1 pg (ref 27.2–33.4)
MCHC: 32.8 g/dL (ref 32.0–36.0)
MCV: 85.8 fL (ref 79.3–98.0)
MONO#: 1.7 10*3/uL — ABNORMAL HIGH (ref 0.1–0.9)
MONO%: 22.4 % — AB (ref 0.0–14.0)
NEUT#: 4.7 10*3/uL (ref 1.5–6.5)
NEUT%: 61.9 % (ref 39.0–75.0)
PLATELETS: 38 10*3/uL — AB (ref 140–400)
RBC: 2.81 10*6/uL — AB (ref 4.20–5.82)
RDW: 19.8 % — ABNORMAL HIGH (ref 11.0–14.6)
RETIC CT ABS: 23.32 10*3/uL — AB (ref 34.80–93.90)
Retic %: 0.83 % (ref 0.80–1.80)
WBC: 7.6 10*3/uL (ref 4.0–10.3)
lymph#: 1.2 10*3/uL (ref 0.9–3.3)
nRBC: 1 % — ABNORMAL HIGH (ref 0–0)

## 2017-03-20 LAB — COMPREHENSIVE METABOLIC PANEL
ALBUMIN: 3.4 g/dL — AB (ref 3.5–5.0)
ALK PHOS: 188 U/L — AB (ref 40–150)
ALT: 24 U/L (ref 0–55)
AST: 17 U/L (ref 5–34)
Anion Gap: 11 mEq/L (ref 3–11)
BILIRUBIN TOTAL: 0.34 mg/dL (ref 0.20–1.20)
BUN: 5.2 mg/dL — AB (ref 7.0–26.0)
CALCIUM: 9.1 mg/dL (ref 8.4–10.4)
CO2: 26 mEq/L (ref 22–29)
Chloride: 101 mEq/L (ref 98–109)
Creatinine: 0.7 mg/dL (ref 0.7–1.3)
EGFR: >60 mL/min/{1.73_m2} (ref >60)
GLUCOSE: 293 mg/dL — AB (ref 70–140)
POTASSIUM: 3.5 meq/L (ref 3.5–5.1)
SODIUM: 138 meq/L (ref 136–145)
TOTAL PROTEIN: 5.9 g/dL — AB (ref 6.4–8.3)

## 2017-03-20 NOTE — Patient Instructions (Signed)
Thank you for choosing [facility] to provide your oncology and hematology care.  To afford each patient quality time with our providers, please arrive 30 minutes before your scheduled appointment time.  If you arrive late for your appointment, you may be asked to reschedule.  We strive to give you quality time with our providers, and arriving late affects you and other patients whose appointments are after yours.   If you are a no show for multiple scheduled visits, you may be dismissed from the clinic at the providers discretion.    Again, thank you for choosing [facility], our hope is that these requests will decrease the amount of time that you wait before being seen by our physicians.  ______________________________________________________________________  Should you have questions after your visit to the [facility], please contact our office at [phone] between the hours of 8:30 and 4:30 p.m.    Voicemails left after 4:30p.m will not be returned until the following business day.    For prescription refill requests, please have your pharmacy contact us directly.  Please also try to allow 48 hours for prescription requests.    Please contact the scheduling department for questions regarding scheduling.  For scheduling of procedures such as PET scans, CT scans, MRI, Ultrasound, etc please contact central scheduling at [phone].    Resources For Cancer Patients and Caregivers:   Oncolink.org:  A wonderful resource for patients and healthcare providers for information regarding your disease, ways to tract your treatment, what to expect, etc.     American Cancer Society:  800-227-2345  Can help patients locate various types of support and financial assistance  Cancer Care: 1-800-813-HOPE (4673) Provides financial assistance, online support groups, medication/co-pay assistance.    [place] DSS:  336-641-3447 Where to apply for food  stamps, Medicaid, and utility assistance  Medicare Rights Center: 800-333-4114 Helps people with Medicare understand their rights and benefits, navigate the Medicare system, and secure the quality healthcare they deserve  SCAT: 336-333-6589 [employer]'s shared-ride transportation service for eligible riders who have a disability that prevents them from riding the fixed route bus.    For additional information on assistance programs please contact our social worker:   Grier Hock/Abigail Elmore:  [phone]            

## 2017-03-20 NOTE — Progress Notes (Signed)
HEMATOLOGY/ONCOLOGY CLINIC NOTE  Date of Service: 03/20/2017  Patient Care Team: Hulan Fess, MD as PCP - General (Family Medicine)  CHIEF COMPLAINTS/PURPOSE OF CONSULTATION:   F/u for newly diagnosed T cell NHL  CURRENT THERAPY: R-CHOEP  HISTORY OF PRESENTING ILLNESS:  See previous note for details  INTERVAL HISTORY  Joseph Powell is a wonderful 61 y.o. male here for his scheduled followup after second cycle of chemotherapy for toxicity check given treatment adjustment and adding Rituxan. Plt today are at 38K, Hgb at 7.9. WBC are at 7.6. No significant symptoms from the anemia. He is accompanied by his wife. He has been doing relatively well since we last saw him. He has not had any issues with his most recent cycle of chemotherapy. He has not had any issues with thrush. He denies any fever, chills, abnormal bleeding/bruising. 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 This has been emotionally therapeutic for him. His night sweats have nearly resolved and he has continued to gain weight. His appetite has been much improved this cycle. His insomnia has been much improved on Trazodone.  Was seen by Dr. Dellis Filbert at [facility] and was recommended autologous transplantation if he achieves CR with current treatment.  On review of systems, pt denies fever, chills, rash, mouth sores, weight loss, decreased appetite, urinary complaints. Denies pain. Pt denies abdominal pain, nausea, vomiting. Notable for that in the above HPI.    MEDICAL HISTORY:  Past Medical History:  Diagnosis Date  . Allergic rhinitis   . Asthma    slight  . Bell's palsy   . GERD (gastroesophageal reflux disease)   . Gout   . Hypercholesteremia   . Melanoma ([place])   . Osteoarthritis   . Sleep apnea    Resolving  . Thrombocytopenia ([place])   . Tubular adenoma     SURGICAL HISTORY: Past Surgical History:  Procedure  Laterality Date  . IR FLUORO GUIDE PORT INSERTION RIGHT  02/17/2017  . IR US GUIDE VASC ACCESS RIGHT  02/17/2017  . KNEE SURGERY  1993  . MELANOMA EXCISION    . NASAL RECONSTRUCTION    . VASECTOMY  1999    SOCIAL HISTORY: Social History   Socioeconomic History  . Marital status: Married    Spouse name: Not on file  . Number of children: Not on file  . Years of education: Not on file  . Highest education level: Not on file  Social Needs  . Financial resource strain: Not on file  . Food insecurity - worry: Not on file  . Food insecurity - inability: Not on file  . Transportation needs - medical: Not on file  . Transportation needs - non-medical: Not on file  Occupational History  . Occupation: Risk analyst  Tobacco Use  . Smoking status: Never Smoker  . Smokeless tobacco: Never Used  Substance and Sexual Activity  . Alcohol use: Yes    Alcohol/week: 1.0 oz    Types: 2 Standard drinks or equivalent per week    Comment: socially  . Drug use: No  . Sexual activity: Not on file  Other Topics Concern  . Not on file  Social History Narrative  . Not on file    FAMILY HISTORY: Family History  Problem Relation Age of Onset  . Diabetes Mother   . Asthma Mother   . Gout Brother   . Colon cancer  Paternal Uncle   . Esophageal cancer Neg Hx   . Stomach cancer Neg Hx     ALLERGIES:  is allergic to penicillins.  MEDICATIONS:  Current Outpatient Medications  Medication Sig Dispense Refill  . albuterol (PROVENTIL HFA;VENTOLIN HFA) 108 (90 Base) MCG/ACT inhaler Inhale 1-2 puffs into the lungs every 6 (six) hours as needed for wheezing or shortness of breath.    . allopurinol (ZYLOPRIM) 100 MG tablet Take 100 mg by mouth 2 (two) times daily.    . benzonatate (TESSALON) 200 MG capsule Take 1 capsule (200 mg total) by mouth 3 (three) times daily as needed for cough. 45 capsule 1  . chlorpheniramine (CHLOR-TRIMETON) 4 MG tablet Take 4 mg every 4 (four) hours as needed by mouth  for allergies.    Marland Kitchen dronabinol (MARINOL) 5 MG capsule Take 1 capsule (5 mg total) by mouth 2 (two) times daily before a meal. 60 capsule 0  . fluconazole (DIFLUCAN) 200 MG tablet Take 1 tablet (200 mg total) by mouth daily. Hold 24h prior to chemotherapy 30 tablet 0  . lactulose (CHRONULAC) 10 GM/15ML solution Take 30 mLs (20 g total) by mouth 3 (three) times daily as needed for moderate constipation. 240 mL 1  . lidocaine-prilocaine (EMLA) cream Apply to affected area once 30 g 3  . LORazepam (ATIVAN) 0.5 MG tablet Take 1 tablet (0.5 mg total) every 8 (eight) hours as needed by mouth for anxiety. 30 tablet 0  . mometasone-formoterol (DULERA) 100-5 MCG/ACT AERO Inhale 2 puffs into the lungs 2 (two) times daily. 1 Inhaler 11  . ondansetron (ZOFRAN) 8 MG tablet Take 1 tablet (8 mg total) by mouth 2 (two) times daily as needed for refractory nausea / vomiting. Start on day 3 after each chemotherapy 30 tablet 1  . pantoprazole (PROTONIX) 40 MG tablet Take 40 mg by mouth daily.     . pravastatin (PRAVACHOL) 40 MG tablet Take 40 mg by mouth daily.      . predniSONE (DELTASONE) 20 MG tablet Take 3 tablets (60 mg total) by mouth daily. Take on days 1-5 of chemotherapy. 15 tablet 5  . prochlorperazine (COMPAZINE) 10 MG tablet Take 1 tablet (10 mg total) by mouth every 6 (six) hours as needed (Nausea or vomiting). 30 tablet 6  . traZODone (DESYREL) 50 MG tablet Take 1-2 tablets (50-100 mg total) at bedtime by mouth. 60 tablet 0   Current Facility-Administered Medications  Medication Dose Route Frequency Provider Last Rate Last Dose  . 0.9 %  sodium chloride infusion  500 mL Intravenous Continuous Milus Banister, MD        REVIEW OF SYSTEMS:    10 Point review of Systems was done is negative except as noted above.  PHYSICAL EXAMINATION: ECOG PERFORMANCE STATUS: 1 - Symptomatic but completely ambulatory  . Vitals:   03/20/17 0928  BP: 116/63  Pulse: (!) 114  Resp: 18  Temp: 98.2 F (36.8 C)    SpO2: 100%   Filed Weights   03/20/17 0928  Weight: 172 lb (78 kg)   .Body mass index is 23.99 kg/m.  GENERAL:alert, in no acute distress and comfortable SKIN: no acute rashes, no significant lesions EYES: conjunctiva are pink and non-injected, sclera anicteric OROPHARYNX: MMM, no exudates, no oropharyngeal erythema or ulceration NECK: supple, no JVD LYMPH:  palpable lymphadenopathy in the cervical left lower neck and no obvious lymphadenopathy in axillary or inguinal regions that I could appreciate LUNGS: clear to auscultation b/l with normal respiratory effort HEART:  regular rate & rhythm ABDOMEN:  normoactive bowel sounds , non tender, not distended. Borderline palpable splenomegaly, no overt hepatomegaly.  Extremity: no pedal edema PSYCH: alert & oriented x 3 with fluent speech NEURO: no focal motor/sensory deficits  LABORATORY DATA:  I have reviewed the data as listed  . CBC Latest Ref Rng & Units 03/20/2017 03/10/2017 02/27/2017  WBC 4.0 - 10.3 10e3/uL 7.6 17.2(H) 2.1(L)  Hemoglobin 13.0 - 17.1 g/dL 7.9(L) 9.7(L) 9.6(L)  Hematocrit 38.4 - 49.9 % 24.1(L) 30.5(L) 28.2(L)  Platelets 140 - 400 10e3/uL 38(L) 130(L) 36(L)    . CMP Latest Ref Rng & Units 03/20/2017 03/10/2017 02/27/2017  Glucose 70 - 140 mg/dl 293(H) 272(H) 171(H)  BUN 7.0 - 26.0 mg/dL 5.2(L) 7.1 11.5  Creatinine 0.7 - 1.3 mg/dL 0.7 0.8 0.8  Sodium 136 - 145 mEq/L 138 141 132(L)  Potassium 3.5 - 5.1 mEq/L 3.5 4.1 3.7  Chloride 101 - 111 mmol/L - - -  CO2 22 - 29 mEq/L 26 27 30(H)  Calcium 8.4 - 10.4 mg/dL 9.1 9.2 9.1  Total Protein 6.4 - 8.3 g/dL 5.9(L) 6.2(L) 6.1(L)  Total Bilirubin 0.20 - 1.20 mg/dL 0.34 0.50 1.50(H)  Alkaline Phos 40 - 150 U/L 188(H) 167(H) 168(H)  AST 5 - 34 U/L 17 24 17   ALT 0 - 55 U/L 24 21 18    Component     Latest Ref Rng & Units 01/26/2017  HIV Screen 4th Generation wRfx     Non Reactive Non Reactive  Hep C Virus Ab     0.0 - 0.9 s/co ratio <0.1  LDH     125 - 245 U/L  522 (H)  Sed Rate     0 - 30 mm/hr 6  Hep B Core Ab, Tot     Negative Negative  Hepatitis B Surface Ag     Negative Negative   Uric acid 3.1      RADIOGRAPHIC STUDIES:  I have personally reviewed the radiological images as listed and agreed with the findings in the report. No results found.  ASSESSMENT & PLAN:    Joseph Powell is a 61 y.o. 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 Splenomegaly with numerous low-attenuation splenic masses. Several small low-attenuation liver masses. The leading consideration is lymphoma, although metastatic disease with unknown primary in on the differential.  Improvement in consitutional symptoms and cervical lymphadenopathy.  #3 Acute on chronic Thrombocytopenia. Patient has has some chronic thrombocytopenia 85-125k for a few years. Platelets today are down to 38k from a count of 130k prior to cycle 2.   Likely related to chemotherapy plus lymphoma.  Will likely improve prior to cycle 3  #4 abnormal liver function tests likely related to liver involvement by tumor.Improving.  # 5 Anemia from NHL+ Chemotherapy - hgb down to 7.9 with no significant symptoms -- significantly increase immat reti suggest good upcoming retic response -Patient counseled that we would consider PRBC transfusion if symptomatic.  # 6 Mild neutropenia - resolved with Neulasta  # 7 Significant Oro-pharyngeal thrush -- causing low grade fever and change in taste -- treated aggressively with Nystatin mouthwashes and PO Fluconazole. Resolved -- continuing fluconazole prophylaxis.  Plan - overall appears to be have tolerated CHOEP-R relatively well.  -thrush has now resolved - continue po fluconazole for prophylaxis. -His counts have been stable and will hopefully bounce with his continued  treatment. He has been mildly anemic  but this has held overall, platelets did bounce up to 130k on 03/11/2017, but this has decreased again to 38K.  -No significant evidence of TLS.  -he has recommended to seek immediate help for any fevers. -added Rituxan from 2nd cycle given aberrant strong CD 20 positivity in the neoplastic T cells. - did have an appointment with Dr Duanne Moron for 2nd opinion at [facility], she recommended PET/CT following his fourth cycle - has been scheduled for 04/30/2017. -She has also recommended outpatient autologous transplant if he achieves CR -Pt to fu with PCP regarding receive of insulin for high blood sugars while receiving steroids. -Instructed pt to monitor blood sugars at home, this has been stable.  -He is to continue f/u with [facility] regarding his transplant coordination.  Patient was recommended to call us with any symptoms of anemia to consider PRBC transfusion if increasing fatigue shortness of breath chest pain or dyspnea on exertion.  #2 History of Melanoma, 2009 -Followed by Dr. Allyson Sabal for continued skin screening   #3 Patient Active Problem List   Diagnosis Date Noted  . Peripheral T cell lymphoma of intrathoracic lymph nodes ([place]) 03/11/2017  . Counseling regarding advanced care planning and goals of care 02/12/2017  . Mature T/NK-cell lymphomas, unspecified, lymph nodes of multiple sites ([place]) 02/10/2017  . Cough variant asthma vs UACS 01/27/2017  . Dyslipidemia 08/05/2016  . Mild asthma 08/05/2016  . DJD (degenerative joint disease) 08/05/2016  . HLD (hyperlipidemia) 07/27/2016  . GERD (gastroesophageal reflux disease) 07/27/2016  . Gout 07/27/2016  . Discomfort in chest 07/27/2016  . OSA (obstructive sleep apnea) 09/19/2013  . Thrombocytopenia ([place]) 04/14/2011  . SCHATZKI'S RING 08/21/2009    RTC with Dr Irene Limbo with labs on 03/30/2017 prior to C3D1chemotherapy Please schedule C3 and C4 of chemotherapy as per orders  All of the patients questions were answered with apparent  satisfaction. The patient knows to call the clinic with any problems, questions or concerns.  I spent 20 minutes counseling the patient face to face. The total time spent in the appointment was 25 minutes and more than 50% was on counseling and direct patient cares.    Sullivan Lone MD [place] AAHIVMS [facility] Hematology/Oncology Physician [facility]  (Office):       [phone] (Work cell):  [phone] (Fax):           [phone]  03/20/2017 9:16 AM   This document serves as a record of services personally performed by Sullivan Lone, MD. It was created on his behalf by Reola Mosher, a trained medical scribe. The creation of this record is based on the scribe's personal observations and the provider's statements to them.   .I have reviewed the above documentation for accuracy and completeness, and I agree with the above.  Brunetta Genera MD

## 2017-03-30 ENCOUNTER — Other Ambulatory Visit (HOSPITAL_BASED_OUTPATIENT_CLINIC_OR_DEPARTMENT_OTHER): Payer: BLUE CROSS/BLUE SHIELD

## 2017-03-30 ENCOUNTER — Ambulatory Visit: Payer: BLUE CROSS/BLUE SHIELD | Admitting: Hematology

## 2017-03-30 ENCOUNTER — Ambulatory Visit (HOSPITAL_BASED_OUTPATIENT_CLINIC_OR_DEPARTMENT_OTHER): Payer: BLUE CROSS/BLUE SHIELD

## 2017-03-30 ENCOUNTER — Encounter: Payer: Self-pay | Admitting: Hematology

## 2017-03-30 VITALS — HR 100 | Resp 18

## 2017-03-30 VITALS — BP 117/65 | HR 111 | Temp 97.8°F | Resp 18 | Ht 71.0 in | Wt 169.8 lb

## 2017-03-30 DIAGNOSIS — D6481 Anemia due to antineoplastic chemotherapy: Secondary | ICD-10-CM | POA: Diagnosis not present

## 2017-03-30 DIAGNOSIS — D696 Thrombocytopenia, unspecified: Secondary | ICD-10-CM

## 2017-03-30 DIAGNOSIS — K769 Liver disease, unspecified: Secondary | ICD-10-CM | POA: Diagnosis not present

## 2017-03-30 DIAGNOSIS — Z5111 Encounter for antineoplastic chemotherapy: Secondary | ICD-10-CM | POA: Diagnosis not present

## 2017-03-30 DIAGNOSIS — C8440 Peripheral T-cell lymphoma, not classified, unspecified site: Secondary | ICD-10-CM

## 2017-03-30 DIAGNOSIS — C8498 Mature T/NK-cell lymphomas, unspecified, lymph nodes of multiple sites: Secondary | ICD-10-CM

## 2017-03-30 DIAGNOSIS — Z7189 Other specified counseling: Secondary | ICD-10-CM

## 2017-03-30 DIAGNOSIS — R161 Splenomegaly, not elsewhere classified: Secondary | ICD-10-CM | POA: Diagnosis not present

## 2017-03-30 DIAGNOSIS — D739 Disease of spleen, unspecified: Secondary | ICD-10-CM

## 2017-03-30 DIAGNOSIS — C8442 Peripheral T-cell lymphoma, not classified, intrathoracic lymph nodes: Secondary | ICD-10-CM

## 2017-03-30 DIAGNOSIS — R7989 Other specified abnormal findings of blood chemistry: Secondary | ICD-10-CM

## 2017-03-30 DIAGNOSIS — Z85828 Personal history of other malignant neoplasm of skin: Secondary | ICD-10-CM

## 2017-03-30 DIAGNOSIS — D649 Anemia, unspecified: Secondary | ICD-10-CM

## 2017-03-30 LAB — COMPREHENSIVE METABOLIC PANEL
ALBUMIN: 3.3 g/dL — AB (ref 3.5–5.0)
ALK PHOS: 164 U/L — AB (ref 40–150)
ALT: 15 U/L (ref 0–55)
ANION GAP: 12 meq/L — AB (ref 3–11)
AST: 14 U/L (ref 5–34)
BUN: 9.5 mg/dL (ref 7.0–26.0)
CALCIUM: 9.6 mg/dL (ref 8.4–10.4)
CHLORIDE: 99 meq/L (ref 98–109)
CO2: 25 mEq/L (ref 22–29)
Creatinine: 0.8 mg/dL (ref 0.7–1.3)
EGFR: >60 mL/min/{1.73_m2} (ref >60)
Glucose: 222 mg/dl — ABNORMAL HIGH (ref 70–140)
POTASSIUM: 3.5 meq/L (ref 3.5–5.1)
Sodium: 136 mEq/L (ref 136–145)
Total Bilirubin: 0.72 mg/dL (ref 0.20–1.20)
Total Protein: 6.7 g/dL (ref 6.4–8.3)

## 2017-03-30 LAB — CBC & DIFF AND RETIC
BASO%: 0.3 % (ref 0.0–2.0)
BASOS ABS: 0.1 10*3/uL (ref 0.0–0.1)
EOS%: 0 % (ref 0.0–7.0)
Eosinophils Absolute: 0 10*3/uL (ref 0.0–0.5)
HEMATOCRIT: 29.4 % — AB (ref 38.4–49.9)
HGB: 9.3 g/dL — ABNORMAL LOW (ref 13.0–17.1)
Immature Retic Fract: 23.3 % — ABNORMAL HIGH (ref 3.00–10.60)
LYMPH%: 10.1 % — AB (ref 14.0–49.0)
MCH: 27.8 pg (ref 27.2–33.4)
MCHC: 31.6 g/dL — AB (ref 32.0–36.0)
MCV: 88 fL (ref 79.3–98.0)
MONO#: 6 10*3/uL — ABNORMAL HIGH (ref 0.1–0.9)
MONO%: 27.3 % — AB (ref 0.0–14.0)
NEUT#: 13.7 10*3/uL — ABNORMAL HIGH (ref 1.5–6.5)
NEUT%: 62.3 % (ref 39.0–75.0)
PLATELETS: 136 10*3/uL — AB (ref 140–400)
RBC: 3.34 10*6/uL — ABNORMAL LOW (ref 4.20–5.82)
RDW: 20.5 % — AB (ref 11.0–14.6)
Retic %: 4.01 % — ABNORMAL HIGH (ref 0.80–1.80)
Retic Ct Abs: 133.93 10*3/uL — ABNORMAL HIGH (ref 34.80–93.90)
WBC: 22 10*3/uL — ABNORMAL HIGH (ref 4.0–10.3)
lymph#: 2.2 10*3/uL (ref 0.9–3.3)

## 2017-03-30 LAB — MAGNESIUM: MAGNESIUM: 2.2 mg/dL (ref 1.5–2.5)

## 2017-03-30 LAB — LACTATE DEHYDROGENASE: LDH: 262 U/L — AB (ref 125–245)

## 2017-03-30 MED ORDER — SODIUM CHLORIDE 0.9 % IV SOLN
200.0000 mg | Freq: Once | INTRAVENOUS | Status: AC
  Administered 2017-03-30: 200 mg via INTRAVENOUS
  Filled 2017-03-30: qty 10

## 2017-03-30 MED ORDER — PALONOSETRON HCL INJECTION 0.25 MG/5ML
INTRAVENOUS | Status: AC
  Filled 2017-03-30: qty 5

## 2017-03-30 MED ORDER — PALONOSETRON HCL INJECTION 0.25 MG/5ML
0.2500 mg | Freq: Once | INTRAVENOUS | Status: AC
  Administered 2017-03-30: 0.25 mg via INTRAVENOUS

## 2017-03-30 MED ORDER — FOSAPREPITANT DIMEGLUMINE INJECTION 150 MG
Freq: Once | INTRAVENOUS | Status: AC
  Administered 2017-03-30: 12:00:00 via INTRAVENOUS
  Filled 2017-03-30: qty 5

## 2017-03-30 MED ORDER — HEPARIN SOD (PORK) LOCK FLUSH 100 UNIT/ML IV SOLN
500.0000 [IU] | Freq: Once | INTRAVENOUS | Status: AC | PRN
  Administered 2017-03-30: 500 [IU]
  Filled 2017-03-30: qty 5

## 2017-03-30 MED ORDER — VINCRISTINE SULFATE CHEMO INJECTION 1 MG/ML
2.0000 mg | Freq: Once | INTRAVENOUS | Status: AC
  Administered 2017-03-30: 2 mg via INTRAVENOUS
  Filled 2017-03-30: qty 2

## 2017-03-30 MED ORDER — SODIUM CHLORIDE 0.9 % IV SOLN
750.0000 mg/m2 | Freq: Once | INTRAVENOUS | Status: AC
  Administered 2017-03-30: 1540 mg via INTRAVENOUS
  Filled 2017-03-30: qty 77

## 2017-03-30 MED ORDER — SODIUM CHLORIDE 0.9 % IV SOLN
Freq: Once | INTRAVENOUS | Status: AC
  Administered 2017-03-30: 11:00:00 via INTRAVENOUS

## 2017-03-30 MED ORDER — SODIUM CHLORIDE 0.9% FLUSH
10.0000 mL | INTRAVENOUS | Status: DC | PRN
  Administered 2017-03-30: 10 mL
  Filled 2017-03-30: qty 10

## 2017-03-30 MED ORDER — DOXORUBICIN HCL CHEMO IV INJECTION 2 MG/ML
50.0000 mg/m2 | Freq: Once | INTRAVENOUS | Status: AC
  Administered 2017-03-30: 102 mg via INTRAVENOUS
  Filled 2017-03-30: qty 51

## 2017-03-30 NOTE — Patient Instructions (Signed)
[facility] Discharge Instructions for Patients Receiving Chemotherapy  Today you received the following chemotherapy agents: Etoposide, Adriamycin, Vincristine, Cytoxan To help prevent nausea and vomiting after your treatment, we encourage you to take your nausea medication as prescribed.   If you develop nausea and vomiting that is not controlled by your nausea medication, call the clinic.   BELOW ARE SYMPTOMS THAT SHOULD BE REPORTED IMMEDIATELY:  *FEVER GREATER THAN 100.5 F  *CHILLS WITH OR WITHOUT FEVER  NAUSEA AND VOMITING THAT IS NOT CONTROLLED WITH YOUR NAUSEA MEDICATION  *UNUSUAL SHORTNESS OF BREATH  *UNUSUAL BRUISING OR BLEEDING  TENDERNESS IN MOUTH AND THROAT WITH OR WITHOUT PRESENCE OF ULCERS  *URINARY PROBLEMS  *BOWEL PROBLEMS  UNUSUAL RASH Items with * indicate a potential emergency and should be followed up as soon as possible.  Feel free to call the clinic should you have any questions or concerns. The clinic phone number is [phone].  Please show the [place] at check-in to the Emergency Department and triage nurse.

## 2017-03-30 NOTE — Progress Notes (Signed)
Excellent blood return before,during and after Adriamycin push.  Patient tolerated well

## 2017-03-30 NOTE — Patient Instructions (Signed)
Thank you for choosing [facility] to provide your oncology and hematology care.  To afford each patient quality time with our providers, please arrive 30 minutes before your scheduled appointment time.  If you arrive late for your appointment, you may be asked to reschedule.  We strive to give you quality time with our providers, and arriving late affects you and other patients whose appointments are after yours.   If you are a no show for multiple scheduled visits, you may be dismissed from the clinic at the providers discretion.    Again, thank you for choosing [facility], our hope is that these requests will decrease the amount of time that you wait before being seen by our physicians.  ______________________________________________________________________  Should you have questions after your visit to the [facility], please contact our office at [phone] between the hours of 8:30 and 4:30 p.m.    Voicemails left after 4:30p.m will not be returned until the following business day.    For prescription refill requests, please have your pharmacy contact us directly.  Please also try to allow 48 hours for prescription requests.    Please contact the scheduling department for questions regarding scheduling.  For scheduling of procedures such as PET scans, CT scans, MRI, Ultrasound, etc please contact central scheduling at [phone].    Resources For Cancer Patients and Caregivers:   Oncolink.org:  A wonderful resource for patients and healthcare providers for information regarding your disease, ways to tract your treatment, what to expect, etc.     American Cancer Society:  800-227-2345  Can help patients locate various types of support and financial assistance  Cancer Care: 1-800-813-HOPE (4673) Provides financial assistance, online support groups, medication/co-pay assistance.    [place] DSS:  336-641-3447 Where to apply for food  stamps, Medicaid, and utility assistance  Medicare Rights Center: 800-333-4114 Helps people with Medicare understand their rights and benefits, navigate the Medicare system, and secure the quality healthcare they deserve  SCAT: 336-333-6589 [place] [employer]'s shared-ride transportation service for eligible riders who have a disability that prevents them from riding the fixed route bus.    For additional information on assistance programs please contact our social worker:   Grier Hock/Abigail Elmore:  [phone]            

## 2017-03-30 NOTE — Progress Notes (Signed)
HEMATOLOGY/ONCOLOGY CONSULTATION NOTE  Date of Service: 03/30/2017  Patient Care Team: Hulan Fess, MD as PCP - General (Family Medicine)  CHIEF COMPLAINTS/PURPOSE OF CONSULTATION:   F/u for newly diagnosed T cell NHL  CURRENT THERAPY: R-CHOEP s/ 2 cycles   HISTORY OF PRESENTING ILLNESS:  See previous note for details  INTERVAL HISTORY  Joseph Powell is a wonderful 61 y.o. male here for his scheduled followup prior C3D1 of chemotherapy. Plt today are 136k, hgb 9.3, WBC 22.0, LDH is 262. He is accompanied by his wife. He states that he is doing well overall. He reports noticing CP which was subsided by tylenol but states overall he's doing better since his last clinic visit.  On ROS, pt reports CP, SOB, chronic non-productive cough, intermittent soreness to the left jaw area and denies fever, chills,  Jaw Pain -See dentist for XR  He reports that he is doing well overall. He states that his appetite has improved and he has gained one pound since his last visit. Ritta Slot has resolved and night sweats have nearly resolved also. He has a history of borderline A1c without need for mx, however instructed the pt today to monitor this at home as well as to f/u with his pcp for consideration of insulin for this while on steroids given hyperglycemia.   On review of systems, pt reports one mild night sweat, pain and soreness to the left jaw and denies fever, chills, constipation, abdominal pain and any acute new accompanying symptoms.    MEDICAL HISTORY:  Past Medical History:  Diagnosis Date  . Allergic rhinitis   . Asthma    slight  . Bell's palsy   . GERD (gastroesophageal reflux disease)   . Gout   . Hypercholesteremia   . Melanoma ([place])   . Osteoarthritis   . Sleep apnea    Resolving  . Thrombocytopenia ([place])   . Tubular adenoma     SURGICAL HISTORY: Past Surgical History:  Procedure Laterality Date  . IR FLUORO GUIDE PORT INSERTION RIGHT  02/17/2017  . IR US  GUIDE VASC ACCESS RIGHT  02/17/2017  . KNEE SURGERY  1993  . MELANOMA EXCISION    . NASAL RECONSTRUCTION    . VASECTOMY  1999    SOCIAL HISTORY: Social History   Socioeconomic History  . Marital status: Married    Spouse name: Not on file  . Number of children: Not on file  . Years of education: Not on file  . Highest education level: Not on file  Social Needs  . Financial resource strain: Not on file  . Food insecurity - worry: Not on file  . Food insecurity - inability: Not on file  . Transportation needs - medical: Not on file  . Transportation needs - non-medical: Not on file  Occupational History  . Occupation: Risk analyst  Tobacco Use  . Smoking status: Never Smoker  . Smokeless tobacco: Never Used  Substance and Sexual Activity  . Alcohol use: Yes    Alcohol/week: 1.0 oz    Types: 2 Standard drinks or equivalent per week    Comment: socially  . Drug use: No  . Sexual activity: Not on file  Other Topics Concern  . Not on file  Social History Narrative  . Not on file    FAMILY HISTORY: Family History  Problem Relation Age of Onset  . Diabetes Mother   . Asthma Mother   . Gout Brother   . Colon cancer Paternal Uncle   .  Esophageal cancer Neg Hx   . Stomach cancer Neg Hx     ALLERGIES:  is allergic to penicillins.  MEDICATIONS:  Current Outpatient Medications  Medication Sig Dispense Refill  . albuterol (PROVENTIL HFA;VENTOLIN HFA) 108 (90 Base) MCG/ACT inhaler Inhale 1-2 puffs into the lungs every 6 (six) hours as needed for wheezing or shortness of breath.    . allopurinol (ZYLOPRIM) 100 MG tablet Take 100 mg by mouth 2 (two) times daily.    . benzonatate (TESSALON) 200 MG capsule Take 1 capsule (200 mg total) by mouth 3 (three) times daily as needed for cough. 45 capsule 1  . chlorpheniramine (CHLOR-TRIMETON) 4 MG tablet Take 4 mg every 4 (four) hours as needed by mouth for allergies.    Marland Kitchen dronabinol (MARINOL) 5 MG capsule Take 1 capsule (5 mg  total) by mouth 2 (two) times daily before a meal. 60 capsule 0  . fluconazole (DIFLUCAN) 200 MG tablet Take 1 tablet (200 mg total) by mouth daily. Hold 24h prior to chemotherapy 30 tablet 0  . lactulose (CHRONULAC) 10 GM/15ML solution Take 30 mLs (20 g total) by mouth 3 (three) times daily as needed for moderate constipation. 240 mL 1  . lidocaine-prilocaine (EMLA) cream Apply to affected area once 30 g 3  . LORazepam (ATIVAN) 0.5 MG tablet Take 1 tablet (0.5 mg total) every 8 (eight) hours as needed by mouth for anxiety. 30 tablet 0  . mometasone-formoterol (DULERA) 100-5 MCG/ACT AERO Inhale 2 puffs into the lungs 2 (two) times daily. 1 Inhaler 11  . ondansetron (ZOFRAN) 8 MG tablet Take 1 tablet (8 mg total) by mouth 2 (two) times daily as needed for refractory nausea / vomiting. Start on day 3 after each chemotherapy 30 tablet 1  . pantoprazole (PROTONIX) 40 MG tablet Take 40 mg by mouth daily.     . pravastatin (PRAVACHOL) 40 MG tablet Take 40 mg by mouth daily.      . predniSONE (DELTASONE) 20 MG tablet Take 3 tablets (60 mg total) by mouth daily. Take on days 1-5 of chemotherapy. 15 tablet 5  . prochlorperazine (COMPAZINE) 10 MG tablet Take 1 tablet (10 mg total) by mouth every 6 (six) hours as needed (Nausea or vomiting). 30 tablet 6  . traZODone (DESYREL) 50 MG tablet Take 1-2 tablets (50-100 mg total) at bedtime by mouth. 60 tablet 0   Current Facility-Administered Medications  Medication Dose Route Frequency Provider Last Rate Last Dose  . 0.9 %  sodium chloride infusion  500 mL Intravenous Continuous Milus Banister, MD        REVIEW OF SYSTEMS:    10 Point review of Systems was done is negative except as noted above.  PHYSICAL EXAMINATION: ECOG PERFORMANCE STATUS: 1 - Symptomatic but completely ambulatory  . Vitals:   03/30/17 0930  BP: 117/65  Pulse: (!) 111  Resp: 18  Temp: 97.8 F (36.6 C)  SpO2: 100%   Filed Weights   03/30/17 0930  Weight: 169 lb 12.8 oz (77  kg)   .Body mass index is 23.68 kg/m.  GENERAL:alert, in no acute distress and comfortable SKIN: no acute rashes, no significant lesions EYES: conjunctiva are pink and non-injected, sclera anicteric OROPHARYNX: MMM, no exudates, no oropharyngeal erythema or ulceration NECK: supple, no JVD LYMPH:  (+) 2 palpable lymphadenopathy in the cervical left lower neck and no obvious lymphadenopathy in axillary or inguinal regions that I could appreciate LUNGS: clear to auscultation b/l with normal respiratory effort HEART: regular rate &  rhythm ABDOMEN:  normoactive bowel sounds , non tender, not distended. Borderline palpable splenomegaly, no overt hepatomegaly.  Extremity: no pedal edema PSYCH: alert & oriented x 3 with fluent speech NEURO: no focal motor/sensory deficits  LABORATORY DATA:  I have reviewed the data as listed  . CBC Latest Ref Rng & Units 03/30/2017 03/20/2017 03/10/2017  WBC 4.0 - 10.3 10e3/uL 22.0(H) 7.6 17.2(H)  Hemoglobin 13.0 - 17.1 g/dL 9.3(L) 7.9(L) 9.7(L)  Hematocrit 38.4 - 49.9 % 29.4(L) 24.1(L) 30.5(L)  Platelets 140 - 400 10e3/uL 136(L) 38(L) 130(L)    . CMP Latest Ref Rng & Units 03/30/2017 03/20/2017 03/10/2017  Glucose 70 - 140 mg/dl 222(H) 293(H) 272(H)  BUN 7.0 - 26.0 mg/dL 9.5 5.2(L) 7.1  Creatinine 0.7 - 1.3 mg/dL 0.8 0.7 0.8  Sodium 136 - 145 mEq/L 136 138 141  Potassium 3.5 - 5.1 mEq/L 3.5 3.5 4.1  Chloride 101 - 111 mmol/L - - -  CO2 22 - 29 mEq/L 25 26 27   Calcium 8.4 - 10.4 mg/dL 9.6 9.1 9.2  Total Protein 6.4 - 8.3 g/dL 6.7 5.9(L) 6.2(L)  Total Bilirubin 0.20 - 1.20 mg/dL 0.72 0.34 0.50  Alkaline Phos 40 - 150 U/L 164(H) 188(H) 167(H)  AST 5 - 34 U/L 14 17 24   ALT 0 - 55 U/L 15 24 21    Uric acid 3.1      RADIOGRAPHIC STUDIES:  I have personally reviewed the radiological images as listed and agreed with the findings in the report. No results found.  ASSESSMENT & PLAN:    Joseph Powell is a 61 y.o. 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 Splenomegaly with numerous low-attenuation splenic masses. Several small low-attenuation liver masses. The leading consideration is lymphoma, although metastatic disease with unknown primary in on the differential.  Improvement in consitutional symptoms and cervical lymphadenopathy.  #3 Acute on chronic Thrombocytopenia. Patient has has some chronic thrombocytopenia 85-125k for a few years. Platelets today are down to 36k in the setting of newly diagnosed lymphoma and treatment with R-CHOEP and from hypersplenism. PLt counts have now improved to 136k  #4 abnormal liver function tests likely related to liver involvement by tumor.Improving.  # 5 Anemia from NHL+ Chemotherapy improved from 7.9 to 9.3  # 6 Mild neutropenia - resolved with Neulasta  # 7 Significant Oro-pharyngeal thrush -- causing low grade fever and change in taste -- treated aggressively with Nystatin mouthwashes and PO Fluconazole. Resolved -- continuing fluconazole prophylaxis.  Plan - overall appears to be have tolerated R-CHOEP relatively well.  -thrush has now resolved - continue po fluconazole for prophylaxis. -His counts have been stable and will hopefully bounce with his continued treatment. He has been mildly anemic but this has held overall, platelets have bounced up to 136k today 03/30/2017 -No significant evidence of TLS.  -he has recommended to seek immediate help for any fevers. -PET/CT following 4th cycle to reevaluate treatment response. -patient has been seen by Dr Duanne Moron for 2nd opinion at [facility] - agrees with current treatment -- plan for patient to be considered for auto HSCT if he acheives CR response to treatment. -Pt to fu with PCP regarding receive of insulin for high blood sugars while receiving steroids. -Instructed pt to monitor blood sugars  at home   #2 History of Melanoma, 2009 -Followed by Dr. Allyson Sabal for continued skin screening   #3  Patient Active Problem List   Diagnosis Date Noted  . Peripheral T cell lymphoma of intrathoracic lymph nodes ([place]) 03/11/2017  . Counseling regarding advanced care planning and goals of care 02/12/2017  . Mature T/NK-cell lymphomas, unspecified, lymph nodes of multiple sites ([place]) 02/10/2017  . Cough variant asthma vs UACS 01/27/2017  . Dyslipidemia 08/05/2016  . Mild asthma 08/05/2016  . DJD (degenerative joint disease) 08/05/2016  . HLD (hyperlipidemia) 07/27/2016  . GERD (gastroesophageal reflux disease) 07/27/2016  . Gout 07/27/2016  . Discomfort in chest 07/27/2016  . OSA (obstructive sleep apnea) 09/19/2013  . Thrombocytopenia ([place]) 04/14/2011  . SCHATZKI'S RING 08/21/2009   #4 Left sided jaw pain -Pt will see dentist for XR  Labs in 12 days RTC with Dr Irene Limbo with C4D1 with labs Please schedule C4,C5 and C6 of his treatment as ordered with labs with each treatment  All of the patients questions were answered with apparent satisfaction. The patient knows to call the clinic with any problems, questions or concerns.  I spent 30 minutes counseling the patient face to face. The total time spent in the appointment was 40 minutes and more than 50% was on counseling and direct patient cares.    Sullivan Lone MD [place] AAHIVMS [facility] Hematology/Oncology Physician [facility]  (Office):       [phone] (Work cell):  [phone] (Fax):           [phone]  03/30/2017 8:32 AM   This document serves as a record of services personally performed by Sullivan Lone, MD. It was created on his behalf by Alean Rinne, a trained medical scribe. The creation of this record is based on the scribe's personal observations and the provider's statements to them.   .I have reviewed the above documentation for accuracy and completeness, and I agree with the above.  Brunetta Genera MD MS

## 2017-03-31 ENCOUNTER — Ambulatory Visit (HOSPITAL_BASED_OUTPATIENT_CLINIC_OR_DEPARTMENT_OTHER): Payer: BLUE CROSS/BLUE SHIELD

## 2017-03-31 VITALS — BP 130/75 | HR 88 | Temp 97.7°F | Resp 18

## 2017-03-31 DIAGNOSIS — Z5111 Encounter for antineoplastic chemotherapy: Secondary | ICD-10-CM | POA: Diagnosis not present

## 2017-03-31 DIAGNOSIS — Z7189 Other specified counseling: Secondary | ICD-10-CM

## 2017-03-31 DIAGNOSIS — C8498 Mature T/NK-cell lymphomas, unspecified, lymph nodes of multiple sites: Secondary | ICD-10-CM

## 2017-03-31 DIAGNOSIS — C8440 Peripheral T-cell lymphoma, not classified, unspecified site: Secondary | ICD-10-CM | POA: Diagnosis not present

## 2017-03-31 MED ORDER — HEPARIN SOD (PORK) LOCK FLUSH 100 UNIT/ML IV SOLN
500.0000 [IU] | Freq: Once | INTRAVENOUS | Status: AC | PRN
  Administered 2017-03-31: 500 [IU]
  Filled 2017-03-31: qty 5

## 2017-03-31 MED ORDER — SODIUM CHLORIDE 0.9 % IV SOLN
96.0000 mg/m2 | Freq: Once | INTRAVENOUS | Status: AC
  Administered 2017-03-31: 200 mg via INTRAVENOUS
  Filled 2017-03-31: qty 10

## 2017-03-31 MED ORDER — PROCHLORPERAZINE EDISYLATE 5 MG/ML IJ SOLN
10.0000 mg | Freq: Once | INTRAMUSCULAR | Status: AC
  Administered 2017-03-31: 10 mg via INTRAVENOUS

## 2017-03-31 MED ORDER — SODIUM CHLORIDE 0.9% FLUSH
10.0000 mL | INTRAVENOUS | Status: DC | PRN
  Administered 2017-03-31: 10 mL
  Filled 2017-03-31: qty 10

## 2017-03-31 MED ORDER — PROCHLORPERAZINE MALEATE 10 MG PO TABS
ORAL_TABLET | ORAL | Status: AC
  Filled 2017-03-31: qty 1

## 2017-03-31 MED ORDER — PROCHLORPERAZINE EDISYLATE 5 MG/ML IJ SOLN
INTRAMUSCULAR | Status: AC
  Filled 2017-03-31: qty 2

## 2017-03-31 NOTE — Patient Instructions (Signed)
Cancer Center Discharge Instructions for Patients Receiving Chemotherapy  Today you received the following chemotherapy agents:  Etoposide.  To help prevent nausea and vomiting after your treatment, we encourage you to take your nausea medication as directed.   If you develop nausea and vomiting that is not controlled by your nausea medication, call the clinic.   BELOW ARE SYMPTOMS THAT SHOULD BE REPORTED IMMEDIATELY:  *FEVER GREATER THAN 100.5 F  *CHILLS WITH OR WITHOUT FEVER  NAUSEA AND VOMITING THAT IS NOT CONTROLLED WITH YOUR NAUSEA MEDICATION  *UNUSUAL SHORTNESS OF BREATH  *UNUSUAL BRUISING OR BLEEDING  TENDERNESS IN MOUTH AND THROAT WITH OR WITHOUT PRESENCE OF ULCERS  *URINARY PROBLEMS  *BOWEL PROBLEMS  UNUSUAL RASH Items with * indicate a potential emergency and should be followed up as soon as possible.  Feel free to call the clinic should you have any questions or concerns. The clinic phone number is [phone].  Please show the CHEMO ALERT CARD at check-in to the Emergency Department and triage nurse.   

## 2017-04-01 ENCOUNTER — Ambulatory Visit (HOSPITAL_BASED_OUTPATIENT_CLINIC_OR_DEPARTMENT_OTHER): Payer: BLUE CROSS/BLUE SHIELD

## 2017-04-01 VITALS — BP 131/77 | HR 97 | Temp 98.6°F | Resp 18

## 2017-04-01 DIAGNOSIS — Z5112 Encounter for antineoplastic immunotherapy: Secondary | ICD-10-CM

## 2017-04-01 DIAGNOSIS — C8442 Peripheral T-cell lymphoma, not classified, intrathoracic lymph nodes: Secondary | ICD-10-CM

## 2017-04-01 DIAGNOSIS — C8440 Peripheral T-cell lymphoma, not classified, unspecified site: Secondary | ICD-10-CM

## 2017-04-01 DIAGNOSIS — Z5111 Encounter for antineoplastic chemotherapy: Secondary | ICD-10-CM

## 2017-04-01 DIAGNOSIS — Z7189 Other specified counseling: Secondary | ICD-10-CM

## 2017-04-01 DIAGNOSIS — C8498 Mature T/NK-cell lymphomas, unspecified, lymph nodes of multiple sites: Secondary | ICD-10-CM

## 2017-04-01 MED ORDER — ACETAMINOPHEN 325 MG PO TABS
650.0000 mg | ORAL_TABLET | Freq: Once | ORAL | Status: AC
  Administered 2017-04-01: 650 mg via ORAL

## 2017-04-01 MED ORDER — ACETAMINOPHEN 325 MG PO TABS
ORAL_TABLET | ORAL | Status: AC
  Filled 2017-04-01: qty 2

## 2017-04-01 MED ORDER — PROCHLORPERAZINE EDISYLATE 5 MG/ML IJ SOLN
INTRAMUSCULAR | Status: AC
  Filled 2017-04-01: qty 2

## 2017-04-01 MED ORDER — PEGFILGRASTIM 6 MG/0.6ML ~~LOC~~ PSKT
6.0000 mg | PREFILLED_SYRINGE | Freq: Once | SUBCUTANEOUS | Status: AC
  Administered 2017-04-01: 6 mg via SUBCUTANEOUS
  Filled 2017-04-01: qty 0.6

## 2017-04-01 MED ORDER — HEPARIN SOD (PORK) LOCK FLUSH 100 UNIT/ML IV SOLN
500.0000 [IU] | Freq: Once | INTRAVENOUS | Status: AC | PRN
  Administered 2017-04-01: 500 [IU]
  Filled 2017-04-01: qty 5

## 2017-04-01 MED ORDER — DIPHENHYDRAMINE HCL 25 MG PO CAPS
ORAL_CAPSULE | ORAL | Status: AC
  Filled 2017-04-01: qty 2

## 2017-04-01 MED ORDER — SODIUM CHLORIDE 0.9% FLUSH
10.0000 mL | INTRAVENOUS | Status: DC | PRN
  Administered 2017-04-01: 10 mL
  Filled 2017-04-01: qty 10

## 2017-04-01 MED ORDER — ETOPOSIDE CHEMO INJECTION 1 GM/50ML
98.0000 mg/m2 | Freq: Once | INTRAVENOUS | Status: AC
  Administered 2017-04-01: 200 mg via INTRAVENOUS
  Filled 2017-04-01: qty 10

## 2017-04-01 MED ORDER — SODIUM CHLORIDE 0.9 % IV SOLN
375.0000 mg/m2 | Freq: Once | INTRAVENOUS | Status: AC
  Administered 2017-04-01: 700 mg via INTRAVENOUS
  Filled 2017-04-01: qty 50

## 2017-04-01 MED ORDER — SODIUM CHLORIDE 0.9 % IV SOLN
Freq: Once | INTRAVENOUS | Status: AC
  Administered 2017-04-01: 10:00:00 via INTRAVENOUS

## 2017-04-01 MED ORDER — DIPHENHYDRAMINE HCL 25 MG PO CAPS
50.0000 mg | ORAL_CAPSULE | Freq: Once | ORAL | Status: AC
  Administered 2017-04-01: 50 mg via ORAL

## 2017-04-01 MED ORDER — SODIUM CHLORIDE 0.9 % IV SOLN: 375.0000 mg/m2 | Freq: Once | INTRAVENOUS | Status: DC

## 2017-04-01 MED ORDER — PROCHLORPERAZINE EDISYLATE 5 MG/ML IJ SOLN
10.0000 mg | Freq: Once | INTRAMUSCULAR | Status: AC
  Administered 2017-04-01: 10 mg via INTRAVENOUS

## 2017-04-01 MED ORDER — PROCHLORPERAZINE MALEATE 10 MG PO TABS
ORAL_TABLET | ORAL | Status: AC
  Filled 2017-04-01: qty 1

## 2017-04-02 ENCOUNTER — Ambulatory Visit: Payer: BLUE CROSS/BLUE SHIELD

## 2017-04-07 ENCOUNTER — Telehealth: Payer: Self-pay

## 2017-04-07 NOTE — Telephone Encounter (Signed)
Per pt wife, pt started experiencing ankle/swelling and night sweats this past evening. Spoke with Dr. Lebron Conners about symptoms. Recommendation for pt to go to the ED and be assessed for potential neutropenic fever. Returned call to pt wife who verbalized understanding. Wife plans to go to the [facility] ED.

## 2017-04-10 ENCOUNTER — Other Ambulatory Visit (HOSPITAL_BASED_OUTPATIENT_CLINIC_OR_DEPARTMENT_OTHER): Payer: BLUE CROSS/BLUE SHIELD

## 2017-04-10 DIAGNOSIS — C8440 Peripheral T-cell lymphoma, not classified, unspecified site: Secondary | ICD-10-CM

## 2017-04-10 DIAGNOSIS — C8442 Peripheral T-cell lymphoma, not classified, intrathoracic lymph nodes: Secondary | ICD-10-CM

## 2017-04-10 LAB — COMPREHENSIVE METABOLIC PANEL
ALT: 23 U/L (ref 0–55)
ANION GAP: 12 meq/L — AB (ref 3–11)
AST: 21 U/L (ref 5–34)
Albumin: 3 g/dL — ABNORMAL LOW (ref 3.5–5.0)
Alkaline Phosphatase: 155 U/L — ABNORMAL HIGH (ref 40–150)
BILIRUBIN TOTAL: 0.63 mg/dL (ref 0.20–1.20)
BUN: 5.6 mg/dL — ABNORMAL LOW (ref 7.0–26.0)
CALCIUM: 9.3 mg/dL (ref 8.4–10.4)
CO2: 24 mEq/L (ref 22–29)
CREATININE: 0.8 mg/dL (ref 0.7–1.3)
Chloride: 100 mEq/L (ref 98–109)
EGFR: >60 mL/min/{1.73_m2} (ref >60)
Glucose: 146 mg/dl — ABNORMAL HIGH (ref 70–140)
Potassium: 3.8 mEq/L (ref 3.5–5.1)
Sodium: 136 mEq/L (ref 136–145)
TOTAL PROTEIN: 6.1 g/dL — AB (ref 6.4–8.3)

## 2017-04-10 LAB — CBC & DIFF AND RETIC
BASO%: 0.3 % (ref 0.0–2.0)
BASOS ABS: 0 10*3/uL (ref 0.0–0.1)
EOS%: 0 % (ref 0.0–7.0)
Eosinophils Absolute: 0 10*3/uL (ref 0.0–0.5)
HCT: 23.6 % — ABNORMAL LOW (ref 38.4–49.9)
HEMOGLOBIN: 7.6 g/dL — AB (ref 13.0–17.1)
Immature Retic Fract: 24 % — ABNORMAL HIGH (ref 3.00–10.60)
LYMPH%: 16.6 % (ref 14.0–49.0)
MCH: 27.3 pg (ref 27.2–33.4)
MCHC: 32.2 g/dL (ref 32.0–36.0)
MCV: 84.9 fL (ref 79.3–98.0)
MONO#: 1.2 10*3/uL — ABNORMAL HIGH (ref 0.1–0.9)
MONO%: 19 % — AB (ref 0.0–14.0)
NEUT%: 64.1 % (ref 39.0–75.0)
NEUTROS ABS: 3.9 10*3/uL (ref 1.5–6.5)
NRBC: 1 % — AB (ref 0–0)
PLATELETS: 52 10*3/uL — AB (ref 140–400)
RBC: 2.78 10*6/uL — ABNORMAL LOW (ref 4.20–5.82)
RDW: 19.8 % — AB (ref 11.0–14.6)
Retic %: 3.46 % — ABNORMAL HIGH (ref 0.80–1.80)
Retic Ct Abs: 96.19 10*3/uL — ABNORMAL HIGH (ref 34.80–93.90)
WBC: 6.1 10*3/uL (ref 4.0–10.3)
lymph#: 1 10*3/uL (ref 0.9–3.3)

## 2017-04-15 DIAGNOSIS — M9903 Segmental and somatic dysfunction of lumbar region: Secondary | ICD-10-CM | POA: Diagnosis not present

## 2017-04-15 DIAGNOSIS — M545 Low back pain: Secondary | ICD-10-CM | POA: Diagnosis not present

## 2017-04-15 DIAGNOSIS — M5137 Other intervertebral disc degeneration, lumbosacral region: Secondary | ICD-10-CM | POA: Diagnosis not present

## 2017-04-15 DIAGNOSIS — M62830 Muscle spasm of back: Secondary | ICD-10-CM | POA: Diagnosis not present

## 2017-04-16 ENCOUNTER — Telehealth: Payer: Self-pay | Admitting: *Deleted

## 2017-04-16 DIAGNOSIS — M62830 Muscle spasm of back: Secondary | ICD-10-CM | POA: Diagnosis not present

## 2017-04-16 DIAGNOSIS — M5137 Other intervertebral disc degeneration, lumbosacral region: Secondary | ICD-10-CM | POA: Diagnosis not present

## 2017-04-16 DIAGNOSIS — M545 Low back pain: Secondary | ICD-10-CM | POA: Diagnosis not present

## 2017-04-16 DIAGNOSIS — M9903 Segmental and somatic dysfunction of lumbar region: Secondary | ICD-10-CM | POA: Diagnosis not present

## 2017-04-16 NOTE — Telephone Encounter (Signed)
Pt's wife called stating pt was put on course of doxycycline over the weekend by PCP due to pt coughing up yellow phlegm.  Wife asking if ok to continue doxycycline with upcoming treatment next week.  Informed wife that any course of abx should be completed and not discontinued half way through course.  Wife stated pt has been feeling better since on abx, no fever, chills, etc.  Informed wife that if patient still exhibiting signs of infection during MD exam on Monday, there is a possibility that chemo may be delayed.  Wife verbalized understanding, will complete course of abx.

## 2017-04-17 DIAGNOSIS — M9903 Segmental and somatic dysfunction of lumbar region: Secondary | ICD-10-CM | POA: Diagnosis not present

## 2017-04-17 DIAGNOSIS — M545 Low back pain: Secondary | ICD-10-CM | POA: Diagnosis not present

## 2017-04-17 DIAGNOSIS — M5137 Other intervertebral disc degeneration, lumbosacral region: Secondary | ICD-10-CM | POA: Diagnosis not present

## 2017-04-17 DIAGNOSIS — M62830 Muscle spasm of back: Secondary | ICD-10-CM | POA: Diagnosis not present

## 2017-04-18 ENCOUNTER — Encounter: Payer: Self-pay | Admitting: Adult Health

## 2017-04-20 ENCOUNTER — Ambulatory Visit (HOSPITAL_COMMUNITY)
Admission: RE | Admit: 2017-04-20 | Discharge: 2017-04-20 | Disposition: A | Discharge status: Home / Self Care | Payer: BLUE CROSS/BLUE SHIELD | Source: Ambulatory Visit | Attending: Hematology | Admitting: Hematology

## 2017-04-20 ENCOUNTER — Other Ambulatory Visit: Payer: BLUE CROSS/BLUE SHIELD

## 2017-04-20 ENCOUNTER — Telehealth: Payer: Self-pay | Admitting: Adult Health

## 2017-04-20 ENCOUNTER — Telehealth: Payer: Self-pay | Admitting: Hematology

## 2017-04-20 ENCOUNTER — Ambulatory Visit (HOSPITAL_BASED_OUTPATIENT_CLINIC_OR_DEPARTMENT_OTHER): Payer: BLUE CROSS/BLUE SHIELD

## 2017-04-20 ENCOUNTER — Other Ambulatory Visit (HOSPITAL_BASED_OUTPATIENT_CLINIC_OR_DEPARTMENT_OTHER): Payer: BLUE CROSS/BLUE SHIELD

## 2017-04-20 ENCOUNTER — Encounter: Payer: Self-pay | Admitting: Adult Health

## 2017-04-20 ENCOUNTER — Ambulatory Visit: Payer: BLUE CROSS/BLUE SHIELD | Admitting: Adult Health

## 2017-04-20 VITALS — HR 100

## 2017-04-20 VITALS — BP 122/72 | HR 121 | Temp 98.3°F | Resp 18 | Ht 71.0 in | Wt 173.4 lb

## 2017-04-20 DIAGNOSIS — C8442 Peripheral T-cell lymphoma, not classified, intrathoracic lymph nodes: Secondary | ICD-10-CM

## 2017-04-20 DIAGNOSIS — D649 Anemia, unspecified: Secondary | ICD-10-CM

## 2017-04-20 DIAGNOSIS — C8498 Mature T/NK-cell lymphomas, unspecified, lymph nodes of multiple sites: Secondary | ICD-10-CM

## 2017-04-20 DIAGNOSIS — R770 Abnormality of albumin: Secondary | ICD-10-CM | POA: Diagnosis not present

## 2017-04-20 DIAGNOSIS — C8448 Peripheral T-cell lymphoma, not classified, lymph nodes of multiple sites: Secondary | ICD-10-CM

## 2017-04-20 DIAGNOSIS — R739 Hyperglycemia, unspecified: Secondary | ICD-10-CM

## 2017-04-20 DIAGNOSIS — Z7189 Other specified counseling: Secondary | ICD-10-CM

## 2017-04-20 DIAGNOSIS — Z5111 Encounter for antineoplastic chemotherapy: Secondary | ICD-10-CM | POA: Diagnosis not present

## 2017-04-20 DIAGNOSIS — D696 Thrombocytopenia, unspecified: Secondary | ICD-10-CM

## 2017-04-20 DIAGNOSIS — R634 Abnormal weight loss: Secondary | ICD-10-CM

## 2017-04-20 LAB — COMPREHENSIVE METABOLIC PANEL
ALBUMIN: 2.8 g/dL — AB (ref 3.5–5.0)
ALK PHOS: 168 U/L — AB (ref 40–150)
ALT: 18 U/L (ref 0–55)
ANION GAP: 10 meq/L (ref 3–11)
AST: 17 U/L (ref 5–34)
BUN: 7.7 mg/dL (ref 7.0–26.0)
CALCIUM: 8.9 mg/dL (ref 8.4–10.4)
CO2: 26 mEq/L (ref 22–29)
CREATININE: 0.7 mg/dL (ref 0.7–1.3)
Chloride: 101 mEq/L (ref 98–109)
EGFR: >60 mL/min/{1.73_m2} (ref >60)
Glucose: 208 mg/dl — ABNORMAL HIGH (ref 70–140)
POTASSIUM: 3.9 meq/L (ref 3.5–5.1)
Sodium: 136 mEq/L (ref 136–145)
Total Bilirubin: 0.5 mg/dL (ref 0.20–1.20)
Total Protein: 5.7 g/dL — ABNORMAL LOW (ref 6.4–8.3)

## 2017-04-20 LAB — CBC & DIFF AND RETIC
BASO%: 0.5 % (ref 0.0–2.0)
BASOS ABS: 0.1 10*3/uL (ref 0.0–0.1)
EOS%: 0.1 % (ref 0.0–7.0)
Eosinophils Absolute: 0 10*3/uL (ref 0.0–0.5)
HEMATOCRIT: 22.8 % — AB (ref 38.4–49.9)
HGB: 7.3 g/dL — ABNORMAL LOW (ref 13.0–17.1)
Immature Retic Fract: 22.6 % — ABNORMAL HIGH (ref 3.00–10.60)
LYMPH#: 0.7 10*3/uL — AB (ref 0.9–3.3)
LYMPH%: 4.5 % — AB (ref 14.0–49.0)
MCH: 26.7 pg — ABNORMAL LOW (ref 27.2–33.4)
MCHC: 31.8 g/dL — AB (ref 32.0–36.0)
MCV: 84.1 fL (ref 79.3–98.0)
MONO#: 1.5 10*3/uL — ABNORMAL HIGH (ref 0.1–0.9)
MONO%: 10.5 % (ref 0.0–14.0)
NEUT#: 12.2 10*3/uL — ABNORMAL HIGH (ref 1.5–6.5)
NEUT%: 84.4 % — AB (ref 39.0–75.0)
PLATELETS: 93 10*3/uL — AB (ref 140–400)
RBC: 2.72 10*6/uL — AB (ref 4.20–5.82)
RDW: 21.7 % — ABNORMAL HIGH (ref 11.0–14.6)
RETIC CT ABS: 96.56 10*3/uL — AB (ref 34.80–93.90)
Retic %: 3.55 % — ABNORMAL HIGH (ref 0.80–1.80)
WBC: 14.4 10*3/uL — ABNORMAL HIGH (ref 4.0–10.3)

## 2017-04-20 LAB — PREPARE RBC (CROSSMATCH)

## 2017-04-20 LAB — HEMOGLOBIN A1C
ESTIMATED AVERAGE GLUCOSE: 148 mg/dL
HEMOGLOBIN A1C: 6.8 % — AB (ref 4.8–5.6)

## 2017-04-20 LAB — ABO/RH: ABO/RH(D): O POS

## 2017-04-20 LAB — MAGNESIUM: Magnesium: 1.7 mg/dl (ref 1.5–2.5)

## 2017-04-20 MED ORDER — SODIUM CHLORIDE 0.9 % IV SOLN
Freq: Once | INTRAVENOUS | Status: AC
  Administered 2017-04-20: 11:00:00 via INTRAVENOUS

## 2017-04-20 MED ORDER — PALONOSETRON HCL INJECTION 0.25 MG/5ML
0.2500 mg | Freq: Once | INTRAVENOUS | Status: AC
  Administered 2017-04-20: 0.25 mg via INTRAVENOUS

## 2017-04-20 MED ORDER — SODIUM CHLORIDE 0.9 % IV SOLN
200.0000 mg | Freq: Once | INTRAVENOUS | Status: AC
  Administered 2017-04-20: 200 mg via INTRAVENOUS
  Filled 2017-04-20: qty 10

## 2017-04-20 MED ORDER — HEPARIN SOD (PORK) LOCK FLUSH 100 UNIT/ML IV SOLN
500.0000 [IU] | Freq: Once | INTRAVENOUS | Status: AC | PRN
  Administered 2017-04-20: 500 [IU]
  Filled 2017-04-20: qty 5

## 2017-04-20 MED ORDER — DOXORUBICIN HCL CHEMO IV INJECTION 2 MG/ML
50.0000 mg/m2 | Freq: Once | INTRAVENOUS | Status: AC
  Administered 2017-04-20: 102 mg via INTRAVENOUS
  Filled 2017-04-20: qty 51

## 2017-04-20 MED ORDER — TRAZODONE HCL 50 MG PO TABS: 50.0000 mg | ORAL_TABLET | Freq: Every day | ORAL | 2 refills | Status: DC

## 2017-04-20 MED ORDER — FLUCONAZOLE 200 MG PO TABS: 200.0000 mg | ORAL_TABLET | Freq: Every day | ORAL | 2 refills | Status: DC

## 2017-04-20 MED ORDER — CYCLOPHOSPHAMIDE CHEMO INJECTION 1 GM
750.0000 mg/m2 | Freq: Once | INTRAMUSCULAR | Status: AC
  Administered 2017-04-20: 1540 mg via INTRAVENOUS
  Filled 2017-04-20: qty 77

## 2017-04-20 MED ORDER — SODIUM CHLORIDE 0.9% FLUSH
10.0000 mL | INTRAVENOUS | Status: DC | PRN
  Administered 2017-04-20: 10 mL
  Filled 2017-04-20: qty 10

## 2017-04-20 MED ORDER — VINCRISTINE SULFATE CHEMO INJECTION 1 MG/ML
2.0000 mg | Freq: Once | INTRAVENOUS | Status: AC
  Administered 2017-04-20: 2 mg via INTRAVENOUS
  Filled 2017-04-20: qty 2

## 2017-04-20 MED ORDER — SODIUM CHLORIDE 0.9 % IV SOLN
Freq: Once | INTRAVENOUS | Status: AC
  Administered 2017-04-20: 12:00:00 via INTRAVENOUS
  Filled 2017-04-20: qty 5

## 2017-04-20 MED ORDER — PALONOSETRON HCL INJECTION 0.25 MG/5ML
INTRAVENOUS | Status: AC
  Filled 2017-04-20: qty 5

## 2017-04-20 NOTE — Progress Notes (Signed)
Per Wilber Bihari, NP ok to proceed with treatment with current labs.

## 2017-04-20 NOTE — Assessment & Plan Note (Signed)
Joseph Powell is receiving chemotherapy with R CHOEP.  Today is cycle 4 day 1.  His hemoglobin is 7.3 today.  We called the charge nurse and told him that he will need blood on days 2 and 3, and then I spoke with [name], RN, the nursing supervisor here at the cancer center in order to get his blood transfusion arranged while he is here tomorrow and Wednesday as we are starting him on chemotherapy today.  She is working on making that happen.  I reviewed risks and benefits of receiving a blood transfusion with him in detail, and he is agreeable.   He will return next week on 12/28 (a day after his PET scan) and see me.  I will review his results with him at that time (since [name] will be out of town).  [name] and his wife are aware that I cannot make any changes in the treatment plan based on the results, and that they would have to wait until his cycle 5 appointment with [name] to discuss that if necessary.    I refilled his fluconazole and Trazodone today.  I counseled him to increase his protein intake with ensure, boost pudding.  We discussed his exercise.  He was recommended after walking to elevate his feet, and I also cleared him to do minor weight lifting with low weight dumbbells (3-8 pounds) if he desires.

## 2017-04-20 NOTE — Patient Instructions (Addendum)
[facility] Discharge Instructions for Patients Receiving Chemotherapy  Today you received the following chemotherapy agents:  Irinotecan, Adriamycin, Vincristine, Cytoxan, and Etoposide.  To help prevent nausea and vomiting after your treatment, we encourage you to take your nausea medication as directed.   If you develop nausea and vomiting that is not controlled by your nausea medication, call the clinic.   BELOW ARE SYMPTOMS THAT SHOULD BE REPORTED IMMEDIATELY:  *FEVER GREATER THAN 100.5 F  *CHILLS WITH OR WITHOUT FEVER  NAUSEA AND VOMITING THAT IS NOT CONTROLLED WITH YOUR NAUSEA MEDICATION  *UNUSUAL SHORTNESS OF BREATH  *UNUSUAL BRUISING OR BLEEDING  TENDERNESS IN MOUTH AND THROAT WITH OR WITHOUT PRESENCE OF ULCERS  *URINARY PROBLEMS  *BOWEL PROBLEMS  UNUSUAL RASH Items with * indicate a potential emergency and should be followed up as soon as possible.  Feel free to call the clinic should you have any questions or concerns. The clinic phone number is [phone].  Please show the [place] at check-in to the Emergency Department and triage nurse.

## 2017-04-20 NOTE — Telephone Encounter (Signed)
Gave patient avs with appts per 12/17 los

## 2017-04-20 NOTE — Progress Notes (Signed)
[facility] Follow up:    Joseph Fess, MD [place] [address]   DIAGNOSIS: Cancer Staging No matching staging information was found for the patient.  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 Splenomegaly with numerous low-attenuation splenic masses. Several small low-attenuation liver masses. The leading consideration is lymphoma, although metastatic disease with unknown primary in on the differential.  Started on R CHOEP chemotherapy on 02/19/2017  CURRENT THERAPY: R CHOEP chemotherapy  INTERVAL HISTORY: Joseph Powell 61 y.o. male returns for evaluation prior to receiving chemotherapy today.  Today is cycle 4 day 1 of treatment.  He is feeling well today.  He occasionally has intermittent dependent swelling in his feet after being on his feet for long times or sitting with his feet on the ground.  His wife has some questions about his weight loss and low albumin level.  He recently had a URI and was prescribed Doxycycline.  He has one day of this medication left.  He has tolerated it well, and his URI has largely resolved.     Patient Active Problem List   Diagnosis Date Noted  . Peripheral T cell lymphoma of intrathoracic lymph nodes ([place]) 03/11/2017  . Counseling regarding advanced care planning and goals of care 02/12/2017  . Mature T/NK-cell lymphomas, unspecified, lymph nodes of multiple sites ([place]) 02/10/2017  . Cough variant asthma vs UACS 01/27/2017  . Dyslipidemia 08/05/2016  . Mild asthma 08/05/2016  . DJD (degenerative joint disease) 08/05/2016  . HLD (hyperlipidemia) 07/27/2016  . GERD (gastroesophageal reflux disease) 07/27/2016  . Gout 07/27/2016  . Discomfort in chest 07/27/2016  . OSA (obstructive sleep apnea) 09/19/2013  . Thrombocytopenia ([place])  04/14/2011  . SCHATZKI'S RING 08/21/2009    is allergic to penicillins.  MEDICAL HISTORY: Past Medical History:  Diagnosis Date  . Allergic rhinitis   . Asthma    slight  . Bell's palsy   . GERD (gastroesophageal reflux disease)   . Gout   . Hypercholesteremia   . Melanoma ([place])   . Osteoarthritis   . Sleep apnea    Resolving  . Thrombocytopenia ([place])   . Tubular adenoma     SURGICAL HISTORY: Past Surgical History:  Procedure Laterality Date  . IR FLUORO GUIDE PORT INSERTION RIGHT  02/17/2017  . IR US GUIDE VASC ACCESS RIGHT  02/17/2017  . KNEE SURGERY  1993  . MELANOMA EXCISION    . NASAL RECONSTRUCTION    . VASECTOMY  1999    SOCIAL HISTORY: Social History   Socioeconomic History  . Marital status: Married    Spouse name: Not on file  . Number of children: Not on file  . Years of education: Not on file  . Highest education level: Not on file  Social Needs  . Financial resource strain: Not on file  . Food insecurity - worry: Not on file  . Food insecurity - inability: Not on file  . Transportation needs - medical: Not on file  . Transportation needs - non-medical: Not on file  Occupational History  . Occupation: Risk analyst  Tobacco Use  . Smoking status: Never Smoker  . Smokeless tobacco: Never Used  Substance and Sexual Activity  . Alcohol use: Yes    Alcohol/week: 1.0 oz    Types: 2 Standard drinks or equivalent per week  Comment: socially  . Drug use: No  . Sexual activity: Not on file  Other Topics Concern  . Not on file  Social History Narrative  . Not on file    FAMILY HISTORY: Family History  Problem Relation Age of Onset  . Diabetes Mother   . Asthma Mother   . Gout Brother   . Colon cancer Paternal Uncle   . Esophageal cancer Neg Hx   . Stomach cancer Neg Hx     Review of Systems  Constitutional: Negative for appetite change, chills and fever.  HENT:   Negative for hearing loss, lump/mass, sore throat and trouble  swallowing.   Eyes: Negative for eye problems and icterus.  Respiratory: Negative for chest tightness, cough and shortness of breath.   Cardiovascular: Negative for chest pain, leg swelling and palpitations.  Gastrointestinal: Negative for abdominal distention, abdominal pain, constipation, diarrhea, nausea and vomiting.  Endocrine: Negative for hot flashes.  Genitourinary: Negative for difficulty urinating.   Musculoskeletal: Negative for arthralgias.  Skin: Negative for itching and rash.  Neurological: Positive for numbness (particularly denies peripheral neuropathy). Negative for dizziness, extremity weakness and headaches.  Hematological: Negative for adenopathy. Does not bruise/bleed easily.  Psychiatric/Behavioral: Negative for depression. The patient is not nervous/anxious.       PHYSICAL EXAMINATION  ECOG PERFORMANCE STATUS: 1 - Symptomatic but completely ambulatory  Vitals:   04/20/17 0940  BP: 122/72  Pulse: (!) 121  Resp: 18  Temp: 98.3 F (36.8 C)  SpO2: 100%    Physical Exam  Constitutional: He is oriented to person, place, and time and well-developed, well-nourished, and in no distress.  HENT:  Head: Normocephalic and atraumatic.  Right Ear: External ear normal.  Left Ear: External ear normal.  Nose: Nose normal.  Mouth/Throat: Oropharynx is clear and moist. No oropharyngeal exudate.  Eyes: Pupils are equal, round, and reactive to light. No scleral icterus.  Neck: Neck supple.  Cardiovascular: Normal rate, regular rhythm and normal heart sounds.  Pulmonary/Chest: Effort normal and breath sounds normal. No respiratory distress. He has no wheezes. He has no rales. He exhibits no tenderness.  Abdominal: Soft. Bowel sounds are normal. He exhibits no distension. There is no tenderness.  Musculoskeletal: Normal range of motion. He exhibits no edema.  Lymphadenopathy:    He has no cervical adenopathy.  Neurological: He is alert and oriented to person, place, and  time.  Skin: Skin is warm and dry.  Psychiatric: Mood and affect normal.    LABORATORY DATA:  CBC    Component Value Date/Time   WBC 14.4 (H) 04/20/2017 0906   WBC 6.5 02/17/2017 0930   RBC 2.72 (L) 04/20/2017 0906   RBC 4.12 (L) 02/17/2017 0930   HGB 7.3 (L) 04/20/2017 0906   HCT 22.8 (L) 04/20/2017 0906   PLT 93 (L) 04/20/2017 0906   MCV 84.1 04/20/2017 0906   MCH 26.7 (L) 04/20/2017 0906   MCH 27.9 02/17/2017 0930   MCHC 31.8 (L) 04/20/2017 0906   MCHC 33.2 02/17/2017 0930   RDW 21.7 (H) 04/20/2017 0906   LYMPHSABS 0.7 (L) 04/20/2017 0906   MONOABS 1.5 (H) 04/20/2017 0906   EOSABS 0.0 04/20/2017 0906   BASOSABS 0.1 04/20/2017 0906    CMP     Component Value Date/Time   NA 136 04/20/2017 0906   K 3.9 04/20/2017 0906   CL 99 (L) 01/20/2017 1138   CO2 26 04/20/2017 0906   GLUCOSE 208 (H) 04/20/2017 0906   BUN 7.7 04/20/2017 0906  CREATININE 0.7 04/20/2017 0906   CALCIUM 8.9 04/20/2017 0906   PROT 5.7 (L) 04/20/2017 0906   ALBUMIN 2.8 (L) 04/20/2017 0906   AST 17 04/20/2017 0906   ALT 18 04/20/2017 0906   ALKPHOS 168 (H) 04/20/2017 0906   BILITOT 0.50 04/20/2017 0906   GFRNONAA >60 01/20/2017 1138   GFRAA >60 01/20/2017 1138        ASSESSMENT and PLAN:   Mature T/NK-cell lymphomas, unspecified, lymph nodes of multiple sites ([place]) Parry Po is receiving chemotherapy with R CHOEP.  Today is cycle 4 day 1.  His hemoglobin is 7.3 today.  We called the charge nurse and told him that he will need blood on days 2 and 3, and then I spoke with [name], RN, the nursing supervisor here at the cancer center in order to get his blood transfusion arranged while he is here tomorrow and Wednesday as we are starting him on chemotherapy today.  She is working on making that happen.  I reviewed risks and benefits of receiving a blood transfusion with him in detail, and he is agreeable.   He will return next week on 12/28 (a day after his PET scan) and see me.  I will  review his results with him at that time (since [name] will be out of town).  U[place]ano and his wife are aware that I cannot make any changes in the treatment plan based on the results, and that they would have to wait until his cycle 5 appointment with [name] to discuss that if necessary.    I refilled his fluconazole and Trazodone today.  I counseled him to increase his protein intake with ensure, boost pudding.  We discussed his exercise.  He was recommended after walking to elevate his feet, and I also cleared him to do minor weight lifting with low weight dumbbells (3-8 pounds) if he desires.     Orders Placed This Encounter  Procedures  . Hemoglobin A1c    Standing Status:   Future    Number of Occurrences:   1    Standing Expiration Date:   04/20/2018    All questions were answered. The patient knows to call the clinic with any problems, questions or concerns. We can certainly see the  patient much sooner if necessary.  A total of (30) minutes of face-to-face time was spent with this patient with greater than 50% of that time in counseling and care-coordination.  This note was electronically signed. Scot Dock, NP 04/20/2017

## 2017-04-20 NOTE — Telephone Encounter (Signed)
Added blood to 12/18 and 12/19 per verbal order and okay per mary moon.

## 2017-04-21 ENCOUNTER — Other Ambulatory Visit: Payer: Self-pay

## 2017-04-21 ENCOUNTER — Other Ambulatory Visit (HOSPITAL_BASED_OUTPATIENT_CLINIC_OR_DEPARTMENT_OTHER): Payer: BLUE CROSS/BLUE SHIELD

## 2017-04-21 ENCOUNTER — Telehealth: Payer: Self-pay

## 2017-04-21 ENCOUNTER — Ambulatory Visit (HOSPITAL_BASED_OUTPATIENT_CLINIC_OR_DEPARTMENT_OTHER): Payer: BLUE CROSS/BLUE SHIELD

## 2017-04-21 ENCOUNTER — Telehealth: Payer: Self-pay | Admitting: Medical Oncology

## 2017-04-21 VITALS — BP 132/83 | HR 78 | Temp 97.6°F | Resp 17

## 2017-04-21 DIAGNOSIS — C8498 Mature T/NK-cell lymphomas, unspecified, lymph nodes of multiple sites: Secondary | ICD-10-CM | POA: Diagnosis not present

## 2017-04-21 DIAGNOSIS — Z5111 Encounter for antineoplastic chemotherapy: Secondary | ICD-10-CM

## 2017-04-21 DIAGNOSIS — C8448 Peripheral T-cell lymphoma, not classified, lymph nodes of multiple sites: Secondary | ICD-10-CM | POA: Diagnosis not present

## 2017-04-21 DIAGNOSIS — C8442 Peripheral T-cell lymphoma, not classified, intrathoracic lymph nodes: Secondary | ICD-10-CM

## 2017-04-21 DIAGNOSIS — Z7189 Other specified counseling: Secondary | ICD-10-CM

## 2017-04-21 LAB — CBC & DIFF AND RETIC
BASO%: 0.2 % (ref 0.0–2.0)
BASOS ABS: 0 10*3/uL (ref 0.0–0.1)
EOS ABS: 0 10*3/uL (ref 0.0–0.5)
EOS%: 0 % (ref 0.0–7.0)
HEMATOCRIT: 25.9 % — AB (ref 38.4–49.9)
HEMOGLOBIN: 8 g/dL — AB (ref 13.0–17.1)
Immature Retic Fract: 19.6 % — ABNORMAL HIGH (ref 3.00–10.60)
LYMPH#: 1.5 10*3/uL (ref 0.9–3.3)
LYMPH%: 9.2 % — ABNORMAL LOW (ref 14.0–49.0)
MCH: 26.9 pg — ABNORMAL LOW (ref 27.2–33.4)
MCHC: 30.9 g/dL — ABNORMAL LOW (ref 32.0–36.0)
MCV: 87.2 fL (ref 79.3–98.0)
MONO#: 2.9 10*3/uL — ABNORMAL HIGH (ref 0.1–0.9)
MONO%: 17.5 % — AB (ref 0.0–14.0)
NEUT#: 12.1 10*3/uL — ABNORMAL HIGH (ref 1.5–6.5)
NEUT%: 73.1 % (ref 39.0–75.0)
PLATELETS: 109 10*3/uL — AB (ref 140–400)
RBC: 2.97 10*6/uL — ABNORMAL LOW (ref 4.20–5.82)
RDW: 19.7 % — AB (ref 11.0–14.6)
RETIC %: 3.39 % — AB (ref 0.80–1.80)
RETIC CT ABS: 100.68 10*3/uL — AB (ref 34.80–93.90)
WBC: 16.5 10*3/uL — ABNORMAL HIGH (ref 4.0–10.3)

## 2017-04-21 LAB — COMPREHENSIVE METABOLIC PANEL
ALT: 21 U/L (ref 0–55)
ANION GAP: 9 meq/L (ref 3–11)
AST: 17 U/L (ref 5–34)
Albumin: 3.1 g/dL — ABNORMAL LOW (ref 3.5–5.0)
Alkaline Phosphatase: 192 U/L — ABNORMAL HIGH (ref 40–150)
BUN: 9.3 mg/dL (ref 7.0–26.0)
CALCIUM: 9.6 mg/dL (ref 8.4–10.4)
CHLORIDE: 104 meq/L (ref 98–109)
CO2: 26 meq/L (ref 22–29)
Creatinine: 0.7 mg/dL (ref 0.7–1.3)
Glucose: 214 mg/dl — ABNORMAL HIGH (ref 70–140)
POTASSIUM: 4.1 meq/L (ref 3.5–5.1)
Sodium: 139 mEq/L (ref 136–145)
Total Bilirubin: 0.5 mg/dL (ref 0.20–1.20)
Total Protein: 6.3 g/dL — ABNORMAL LOW (ref 6.4–8.3)

## 2017-04-21 MED ORDER — PROCHLORPERAZINE EDISYLATE 5 MG/ML IJ SOLN
10.0000 mg | Freq: Once | INTRAMUSCULAR | Status: AC
  Administered 2017-04-21: 10 mg via INTRAVENOUS

## 2017-04-21 MED ORDER — ACETAMINOPHEN 325 MG PO TABS
650.0000 mg | ORAL_TABLET | Freq: Once | ORAL | Status: AC
  Administered 2017-04-21: 650 mg via ORAL

## 2017-04-21 MED ORDER — DIPHENHYDRAMINE HCL 25 MG PO CAPS
ORAL_CAPSULE | ORAL | Status: AC
  Filled 2017-04-21: qty 1

## 2017-04-21 MED ORDER — ACETAMINOPHEN 325 MG PO TABS
ORAL_TABLET | ORAL | Status: AC
  Filled 2017-04-21: qty 2

## 2017-04-21 MED ORDER — DIPHENHYDRAMINE HCL 25 MG PO CAPS
25.0000 mg | ORAL_CAPSULE | Freq: Once | ORAL | Status: AC
  Administered 2017-04-21: 25 mg via ORAL

## 2017-04-21 MED ORDER — HEPARIN SOD (PORK) LOCK FLUSH 100 UNIT/ML IV SOLN
250.0000 [IU] | INTRAVENOUS | Status: DC | PRN
  Filled 2017-04-21: qty 5

## 2017-04-21 MED ORDER — PROCHLORPERAZINE MALEATE 10 MG PO TABS
ORAL_TABLET | ORAL | Status: AC
  Filled 2017-04-21: qty 1

## 2017-04-21 MED ORDER — PROCHLORPERAZINE EDISYLATE 5 MG/ML IJ SOLN
INTRAMUSCULAR | Status: AC
  Filled 2017-04-21: qty 2

## 2017-04-21 MED ORDER — SODIUM CHLORIDE 0.9% FLUSH
10.0000 mL | INTRAVENOUS | Status: AC | PRN
  Administered 2017-04-21: 10 mL
  Filled 2017-04-21: qty 10

## 2017-04-21 MED ORDER — SODIUM CHLORIDE 0.9 % IV SOLN
250.0000 mL | Freq: Once | INTRAVENOUS | Status: AC
  Administered 2017-04-21: 250 mL via INTRAVENOUS

## 2017-04-21 MED ORDER — HEPARIN SOD (PORK) LOCK FLUSH 100 UNIT/ML IV SOLN
500.0000 [IU] | Freq: Every day | INTRAVENOUS | Status: AC | PRN
  Administered 2017-04-21: 500 [IU]
  Filled 2017-04-21: qty 5

## 2017-04-21 MED ORDER — ETOPOSIDE CHEMO INJECTION 1 GM/50ML
98.0000 mg/m2 | Freq: Once | INTRAVENOUS | Status: AC
  Administered 2017-04-21: 200 mg via INTRAVENOUS
  Filled 2017-04-21: qty 10

## 2017-04-21 NOTE — Patient Instructions (Signed)
[facility] Discharge Instructions for Patients Receiving Chemotherapy  Today you received the following chemotherapy agents Etoposide  To help prevent nausea and vomiting after your treatment, we encourage you to take your nausea medication as directed   If you develop nausea and vomiting that is not controlled by your nausea medication, call the clinic.   BELOW ARE SYMPTOMS THAT SHOULD BE REPORTED IMMEDIATELY:  *FEVER GREATER THAN 100.5 F  *CHILLS WITH OR WITHOUT FEVER  NAUSEA AND VOMITING THAT IS NOT CONTROLLED WITH YOUR NAUSEA MEDICATION  *UNUSUAL SHORTNESS OF BREATH  *UNUSUAL BRUISING OR BLEEDING  TENDERNESS IN MOUTH AND THROAT WITH OR WITHOUT PRESENCE OF ULCERS  *URINARY PROBLEMS  *BOWEL PROBLEMS  UNUSUAL RASH Items with * indicate a potential emergency and should be followed up as soon as possible.  Feel free to call the clinic should you have any questions or concerns. The clinic phone number is [phone].  Please show the CHEMO ALERT CARD at check-in to the Emergency Department and triage nurse.   Blood Transfusion, Adult A blood transfusion is a procedure in which you receive donated blood, including plasma, platelets, and red blood cells, through an IV tube. You may need a blood transfusion because of illness, surgery, or injury. The blood may come from a donor. You may also be able to donate blood for yourself (autologous blood donation) before a surgery if you know that you might require a blood transfusion. The blood given in a transfusion is made up of different types of cells. You may receive:  Red blood cells. These carry oxygen to the cells in the body.  White blood cells. These help you fight infections.  Platelets. These help your blood to clot.  Plasma. This is the liquid part of your blood and it helps with fluid imbalances.  If you have hemophilia or another clotting disorder, you may also receive other types of blood  products. Tell a health care provider about:  Any allergies you have.  All medicines you are taking, including vitamins, herbs, eye drops, creams, and over-the-counter medicines.  Any problems you or family members have had with anesthetic medicines.  Any blood disorders you have.  Any surgeries you have had.  Any medical conditions you have, including any recent fever or cold symptoms.  Whether you are pregnant or may be pregnant.  Any previous reactions you have had during a blood transfusion. What are the risks? Generally, this is a safe procedure. However, problems may occur, including:  Having an allergic reaction to something in the donated blood. Hives and itching may be symptoms of this type of reaction.  Fever. This may be a reaction to the white blood cells in the transfused blood. Nausea or chest pain may accompany a fever.  Iron overload. This can happen from having many transfusions.  Transfusion-related acute lung injury (TRALI). This is a rare reaction that causes lung damage. The cause is not known.TRALI can occur within hours of a transfusion or several days later.  Sudden (acute) or delayed hemolytic reactions. This happens if your blood does not match the cells in your transfusion. Your body's defense system (immune system) may try to attack the new cells. This complication is rare. The symptoms include fever, chills, nausea, and low back pain or chest pain.  Infection or disease transmission. This is rare.  What happens before the procedure?  You will have a blood test to determine your blood type. This is necessary to know what kind of   blood your body will accept and to match it to the donor blood.  If you are going to have a planned surgery, you may be able to do an autologous blood donation. This may be done in case you need to have a transfusion.  If you have had an allergic reaction to a transfusion in the past, you may be given medicine to help  prevent a reaction. This medicine may be given to you by mouth or through an IV tube.  You will have your temperature, blood pressure, and pulse monitored before the transfusion.  Follow instructions from your health care provider about eating and drinking restrictions.  Ask your health care provider about: ? Changing or stopping your regular medicines. This is especially important if you are taking diabetes medicines or blood thinners. ? Taking medicines such as aspirin and ibuprofen. These medicines can thin your blood. Do not take these medicines before your procedure if your health care provider instructs you not to. What happens during the procedure?  An IV tube will be inserted into one of your veins.  The bag of donated blood will be attached to your IV tube. The blood will then enter through your vein.  Your temperature, blood pressure, and pulse will be monitored regularly during the transfusion. This monitoring is done to detect early signs of a transfusion reaction.  If you have any signs or symptoms of a reaction, your transfusion will be stopped and you may be given medicine.  When the transfusion is complete, your IV tube will be removed.  Pressure may be applied to the IV site for a few minutes.  A bandage (dressing) will be applied. The procedure may vary among health care providers and hospitals. What happens after the procedure?  Your temperature, blood pressure, heart rate, breathing rate, and blood oxygen level will be monitored often.  Your blood may be tested to see how you are responding to the transfusion.  You may be warmed with fluids or blankets to maintain a normal body temperature. Summary  A blood transfusion is a procedure in which you receive donated blood, including plasma, platelets, and red blood cells, through an IV tube.  Your temperature, blood pressure, and pulse will be monitored before, during, and after the transfusion.  Your blood may  be tested after the transfusion to see how your body has responded. This information is not intended to replace advice given to you by your health care provider. Make sure you discuss any questions you have with your health care provider. Document Released: 04/18/2000 Document Revised: 01/17/2016 Document Reviewed: 01/17/2016 Elsevier Interactive Patient Education  2018 [employer]  

## 2017-04-21 NOTE — Telephone Encounter (Signed)
Labs faxed to PCP

## 2017-04-21 NOTE — Telephone Encounter (Signed)
Patient scheduled for labs Mon/Tues/Wed this week. Only required to have lab work Monday. Labs on Wednesday cancelled. Pt aware of change.

## 2017-04-22 ENCOUNTER — Ambulatory Visit (HOSPITAL_BASED_OUTPATIENT_CLINIC_OR_DEPARTMENT_OTHER): Payer: BLUE CROSS/BLUE SHIELD

## 2017-04-22 ENCOUNTER — Other Ambulatory Visit: Payer: BLUE CROSS/BLUE SHIELD

## 2017-04-22 VITALS — BP 131/87 | HR 73 | Temp 98.3°F | Resp 16

## 2017-04-22 DIAGNOSIS — Z5111 Encounter for antineoplastic chemotherapy: Secondary | ICD-10-CM | POA: Diagnosis not present

## 2017-04-22 DIAGNOSIS — Z5112 Encounter for antineoplastic immunotherapy: Secondary | ICD-10-CM

## 2017-04-22 DIAGNOSIS — Z5189 Encounter for other specified aftercare: Secondary | ICD-10-CM | POA: Diagnosis not present

## 2017-04-22 DIAGNOSIS — C8498 Mature T/NK-cell lymphomas, unspecified, lymph nodes of multiple sites: Secondary | ICD-10-CM

## 2017-04-22 DIAGNOSIS — R7301 Impaired fasting glucose: Secondary | ICD-10-CM | POA: Diagnosis not present

## 2017-04-22 DIAGNOSIS — Z8572 Personal history of non-Hodgkin lymphomas: Secondary | ICD-10-CM | POA: Diagnosis not present

## 2017-04-22 DIAGNOSIS — C8448 Peripheral T-cell lymphoma, not classified, lymph nodes of multiple sites: Secondary | ICD-10-CM

## 2017-04-22 DIAGNOSIS — C8442 Peripheral T-cell lymphoma, not classified, intrathoracic lymph nodes: Secondary | ICD-10-CM

## 2017-04-22 DIAGNOSIS — Z7189 Other specified counseling: Secondary | ICD-10-CM

## 2017-04-22 LAB — TYPE AND SCREEN
ABO/RH(D): O POS
Antibody Screen: NEGATIVE
UNIT DIVISION: 0
UNIT DIVISION: 0

## 2017-04-22 LAB — BPAM RBC
BLOOD PRODUCT EXPIRATION DATE: 201901082359
BLOOD PRODUCT EXPIRATION DATE: 201901122359
ISSUE DATE / TIME: 201812180936
ISSUE DATE / TIME: 201812180936
UNIT TYPE AND RH: 5100
Unit Type and Rh: 5100

## 2017-04-22 MED ORDER — PROCHLORPERAZINE EDISYLATE 5 MG/ML IJ SOLN
INTRAMUSCULAR | Status: AC
  Filled 2017-04-22: qty 2

## 2017-04-22 MED ORDER — SODIUM CHLORIDE 0.9 % IV SOLN
98.0000 mg/m2 | Freq: Once | INTRAVENOUS | Status: AC
  Administered 2017-04-22: 200 mg via INTRAVENOUS
  Filled 2017-04-22: qty 10

## 2017-04-22 MED ORDER — SODIUM CHLORIDE 0.9 % IV SOLN
375.0000 mg/m2 | Freq: Once | INTRAVENOUS | Status: AC
  Administered 2017-04-22: 700 mg via INTRAVENOUS
  Filled 2017-04-22: qty 50

## 2017-04-22 MED ORDER — ACETAMINOPHEN 325 MG PO TABS
650.0000 mg | ORAL_TABLET | Freq: Once | ORAL | Status: AC
  Administered 2017-04-22: 650 mg via ORAL

## 2017-04-22 MED ORDER — PEGFILGRASTIM 6 MG/0.6ML ~~LOC~~ PSKT
PREFILLED_SYRINGE | SUBCUTANEOUS | Status: AC
  Filled 2017-04-22: qty 0.6

## 2017-04-22 MED ORDER — SODIUM CHLORIDE 0.9% FLUSH
10.0000 mL | INTRAVENOUS | Status: DC | PRN
  Administered 2017-04-22: 10 mL
  Filled 2017-04-22: qty 10

## 2017-04-22 MED ORDER — DIPHENHYDRAMINE HCL 25 MG PO CAPS
50.0000 mg | ORAL_CAPSULE | Freq: Once | ORAL | Status: AC
  Administered 2017-04-22: 50 mg via ORAL

## 2017-04-22 MED ORDER — HEPARIN SOD (PORK) LOCK FLUSH 100 UNIT/ML IV SOLN
500.0000 [IU] | Freq: Once | INTRAVENOUS | Status: AC | PRN
  Administered 2017-04-22: 500 [IU]
  Filled 2017-04-22: qty 5

## 2017-04-22 MED ORDER — ACETAMINOPHEN 325 MG PO TABS
ORAL_TABLET | ORAL | Status: AC
  Filled 2017-04-22: qty 2

## 2017-04-22 MED ORDER — DIPHENHYDRAMINE HCL 25 MG PO CAPS
ORAL_CAPSULE | ORAL | Status: AC
  Filled 2017-04-22: qty 2

## 2017-04-22 MED ORDER — PROCHLORPERAZINE EDISYLATE 5 MG/ML IJ SOLN
10.0000 mg | Freq: Once | INTRAMUSCULAR | Status: AC
  Administered 2017-04-22: 10 mg via INTRAVENOUS

## 2017-04-22 MED ORDER — PEGFILGRASTIM 6 MG/0.6ML ~~LOC~~ PSKT
6.0000 mg | PREFILLED_SYRINGE | Freq: Once | SUBCUTANEOUS | Status: AC
  Administered 2017-04-22: 6 mg via SUBCUTANEOUS

## 2017-04-22 MED ORDER — SODIUM CHLORIDE 0.9 % IV SOLN
Freq: Once | INTRAVENOUS | Status: AC
  Administered 2017-04-22: 09:00:00 via INTRAVENOUS

## 2017-04-22 NOTE — Patient Instructions (Signed)
[facility] Discharge Instructions for Patients Receiving Chemotherapy  Today you received the following chemotherapy agents: Etoposide (Vepesid) and Rituximab (Rituxan).  To help prevent nausea and vomiting after your treatment, we encourage you to take your nausea medication as prescribed. If you develop nausea and vomiting that is not controlled by your nausea medication, call the clinic.   BELOW ARE SYMPTOMS THAT SHOULD BE REPORTED IMMEDIATELY:  *FEVER GREATER THAN 100.5 F  *CHILLS WITH OR WITHOUT FEVER  NAUSEA AND VOMITING THAT IS NOT CONTROLLED WITH YOUR NAUSEA MEDICATION  *UNUSUAL SHORTNESS OF BREATH  *UNUSUAL BRUISING OR BLEEDING  TENDERNESS IN MOUTH AND THROAT WITH OR WITHOUT PRESENCE OF ULCERS  *URINARY PROBLEMS  *BOWEL PROBLEMS  UNUSUAL RASH Items with * indicate a potential emergency and should be followed up as soon as possible.  Feel free to call the clinic should you have any questions or concerns. The clinic phone number is [phone].  Please show the [place] at check-in to the Emergency Department and triage nurse.

## 2017-04-23 ENCOUNTER — Ambulatory Visit: Payer: BLUE CROSS/BLUE SHIELD

## 2017-04-23 DIAGNOSIS — M5137 Other intervertebral disc degeneration, lumbosacral region: Secondary | ICD-10-CM | POA: Diagnosis not present

## 2017-04-23 DIAGNOSIS — M62830 Muscle spasm of back: Secondary | ICD-10-CM | POA: Diagnosis not present

## 2017-04-23 DIAGNOSIS — M9903 Segmental and somatic dysfunction of lumbar region: Secondary | ICD-10-CM | POA: Diagnosis not present

## 2017-04-23 DIAGNOSIS — M545 Low back pain: Secondary | ICD-10-CM | POA: Diagnosis not present

## 2017-04-30 ENCOUNTER — Ambulatory Visit (HOSPITAL_COMMUNITY)
Admission: RE | Admit: 2017-04-30 | Discharge: 2017-04-30 | Disposition: A | Discharge status: Home / Self Care | Payer: BLUE CROSS/BLUE SHIELD | Source: Ambulatory Visit | Attending: Hematology | Admitting: Hematology

## 2017-04-30 ENCOUNTER — Telehealth: Payer: Self-pay

## 2017-04-30 DIAGNOSIS — I700 Atherosclerosis of aorta: Secondary | ICD-10-CM | POA: Insufficient documentation

## 2017-04-30 DIAGNOSIS — N200 Calculus of kidney: Secondary | ICD-10-CM | POA: Diagnosis not present

## 2017-04-30 DIAGNOSIS — I2510 Atherosclerotic heart disease of native coronary artery without angina pectoris: Secondary | ICD-10-CM | POA: Diagnosis not present

## 2017-04-30 DIAGNOSIS — C8442 Peripheral T-cell lymphoma, not classified, intrathoracic lymph nodes: Secondary | ICD-10-CM | POA: Diagnosis not present

## 2017-04-30 DIAGNOSIS — J90 Pleural effusion, not elsewhere classified: Secondary | ICD-10-CM | POA: Insufficient documentation

## 2017-04-30 DIAGNOSIS — C8440 Peripheral T-cell lymphoma, not classified, unspecified site: Secondary | ICD-10-CM | POA: Diagnosis not present

## 2017-04-30 DIAGNOSIS — I313 Pericardial effusion (noninflammatory): Secondary | ICD-10-CM | POA: Insufficient documentation

## 2017-04-30 DIAGNOSIS — N400 Enlarged prostate without lower urinary tract symptoms: Secondary | ICD-10-CM | POA: Diagnosis not present

## 2017-04-30 LAB — GLUCOSE, CAPILLARY: Glucose-Capillary: 165 mg/dL — ABNORMAL HIGH (ref 65–99)

## 2017-04-30 MED ORDER — FLUDEOXYGLUCOSE F - 18 (FDG) INJECTION
8.7000 | Freq: Once | INTRAVENOUS | Status: AC | PRN
  Administered 2017-04-30: 8.65 via INTRAVENOUS

## 2017-04-30 NOTE — Telephone Encounter (Signed)
Received a call from [facility] with a report of patient's PET scan this am. Patient has an appointment with Wilber Bihari, NP tomorrow 05/01/2017. Conley Rolls the fax of report received and per caller request at [facility] made her aware of "new small pericardial effusion". Mendel Ryder will review with patient at his appointment tomorrow.

## 2017-05-01 ENCOUNTER — Other Ambulatory Visit: Payer: Self-pay

## 2017-05-01 ENCOUNTER — Ambulatory Visit: Payer: BLUE CROSS/BLUE SHIELD | Admitting: Adult Health

## 2017-05-01 ENCOUNTER — Other Ambulatory Visit: Payer: Self-pay | Admitting: Adult Health

## 2017-05-01 ENCOUNTER — Other Ambulatory Visit (HOSPITAL_BASED_OUTPATIENT_CLINIC_OR_DEPARTMENT_OTHER): Payer: BLUE CROSS/BLUE SHIELD

## 2017-05-01 ENCOUNTER — Encounter: Payer: Self-pay | Admitting: Adult Health

## 2017-05-01 ENCOUNTER — Telehealth: Payer: Self-pay | Admitting: Adult Health

## 2017-05-01 VITALS — BP 135/76 | HR 125 | Temp 97.8°F | Resp 16 | Ht 71.0 in | Wt 175.5 lb

## 2017-05-01 DIAGNOSIS — C8498 Mature T/NK-cell lymphomas, unspecified, lymph nodes of multiple sites: Secondary | ICD-10-CM

## 2017-05-01 DIAGNOSIS — C8448 Peripheral T-cell lymphoma, not classified, lymph nodes of multiple sites: Secondary | ICD-10-CM

## 2017-05-01 DIAGNOSIS — C8442 Peripheral T-cell lymphoma, not classified, intrathoracic lymph nodes: Secondary | ICD-10-CM

## 2017-05-01 LAB — CBC WITH DIFFERENTIAL/PLATELET
BASO%: 1.7 % (ref 0.0–2.0)
Basophils Absolute: 0.3 10*3/uL — ABNORMAL HIGH (ref 0.0–0.1)
EOS%: 0 % (ref 0.0–7.0)
Eosinophils Absolute: 0 10*3/uL (ref 0.0–0.5)
HEMATOCRIT: 27.6 % — AB (ref 38.4–49.9)
HEMOGLOBIN: 8.6 g/dL — AB (ref 13.0–17.1)
LYMPH#: 1 10*3/uL (ref 0.9–3.3)
LYMPH%: 6.3 % — ABNORMAL LOW (ref 14.0–49.0)
MCH: 27.2 pg (ref 27.2–33.4)
MCHC: 31.2 g/dL — ABNORMAL LOW (ref 32.0–36.0)
MCV: 87.3 fL (ref 79.3–98.0)
MONO#: 1.8 10*3/uL — AB (ref 0.1–0.9)
MONO%: 10.9 % (ref 0.0–14.0)
NEUT%: 81.1 % — AB (ref 39.0–75.0)
NEUTROS ABS: 13.1 10*3/uL — AB (ref 1.5–6.5)
NRBC: 1 % — AB (ref 0–0)
Platelets: 30 10*3/uL — ABNORMAL LOW (ref 140–400)
RBC: 3.16 10*6/uL — ABNORMAL LOW (ref 4.20–5.82)
RDW: 18.4 % — AB (ref 11.0–14.6)
WBC: 16.1 10*3/uL — AB (ref 4.0–10.3)

## 2017-05-01 LAB — COMPREHENSIVE METABOLIC PANEL
ALBUMIN: 3.4 g/dL — AB (ref 3.5–5.0)
ALK PHOS: 256 U/L — AB (ref 40–150)
ALT: 25 U/L (ref 0–55)
ANION GAP: 10 meq/L (ref 3–11)
AST: 24 U/L (ref 5–34)
BILIRUBIN TOTAL: 0.4 mg/dL (ref 0.20–1.20)
BUN: 5.6 mg/dL — ABNORMAL LOW (ref 7.0–26.0)
CO2: 26 mEq/L (ref 22–29)
Calcium: 8.8 mg/dL (ref 8.4–10.4)
Chloride: 101 mEq/L (ref 98–109)
Creatinine: 0.7 mg/dL (ref 0.7–1.3)
GLUCOSE: 223 mg/dL — AB (ref 70–140)
POTASSIUM: 3.6 meq/L (ref 3.5–5.1)
SODIUM: 137 meq/L (ref 136–145)
Total Protein: 6.1 g/dL — ABNORMAL LOW (ref 6.4–8.3)

## 2017-05-01 LAB — LACTATE DEHYDROGENASE: LDH: 327 U/L — ABNORMAL HIGH (ref 125–245)

## 2017-05-01 NOTE — Telephone Encounter (Signed)
No 12/28 los.  

## 2017-05-01 NOTE — Progress Notes (Signed)
[facility] Follow up:    Joseph Fess, MD [place] [address]   DIAGNOSIS: Cancer Staging No matching staging information was found for the patient.  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 Splenomegaly with numerous low-attenuation splenic masses. Several small low-attenuation liver masses. The leading consideration is lymphoma, although metastatic disease with unknown primary in on the differential.  Started on R CHOEP chemotherapy on 02/19/2017   CURRENT THERAPY: cycle 4 day 10 R CHOEP chemotherapy  INTERVAL HISTORY: Joseph Powell 62 y.o. male returns for evalaution after receiving his fourth cycle of R CHOEP chemotherapy.  He is doing well today.  He underwent a PET scan yesterday that demonstrated a probable complete metabolic response.  It did however demonstrate a new small pericardial effusion.  Joseph Powell denies any cardiac issues such as chest pain, palpitations, swelling, increased DOE, orthopnea.  He is slightly tachycardic today, and has been at his previous few visits.  He was anemic on cycle 4 day 1, and received 2 units of blood with his treatments, and he tolerated the transufions well also.     Patient Active Problem List   Diagnosis Date Noted  . Peripheral T cell lymphoma of intrathoracic lymph nodes ([place]) 03/11/2017  . Counseling regarding advanced care planning and goals of care 02/12/2017  . Mature T/NK-cell lymphomas, unspecified, lymph nodes of multiple sites ([place]) 02/10/2017  . Cough variant asthma vs UACS 01/27/2017  . Dyslipidemia 08/05/2016  . Mild asthma 08/05/2016  . DJD (degenerative joint disease) 08/05/2016  . HLD (hyperlipidemia) 07/27/2016  . GERD (gastroesophageal reflux disease) 07/27/2016  . Gout 07/27/2016  . Discomfort in chest  07/27/2016  . OSA (obstructive sleep apnea) 09/19/2013  . Thrombocytopenia ([place] [facility]) 04/14/2011  . SCHATZKI'S RING 08/21/2009    is allergic to penicillins.  MEDICAL HISTORY: Past Medical History:  Diagnosis Date  . Allergic rhinitis   . Asthma    slight  . Bell's palsy   . GERD (gastroesophageal reflux disease)   . Gout   . Hypercholesteremia   . Melanoma ([place])   . Osteoarthritis   . Sleep apnea    Resolving  . Thrombocytopenia ([place])   . Tubular adenoma     SURGICAL HISTORY: Past Surgical History:  Procedure Laterality Date  . IR FLUORO GUIDE PORT INSERTION RIGHT  02/17/2017  . IR US GUIDE VASC ACCESS RIGHT  02/17/2017  . KNEE SURGERY  1993  . MELANOMA EXCISION    . NASAL RECONSTRUCTION    . VASECTOMY  1999    SOCIAL HISTORY: Social History   Socioeconomic History  . Marital status: Married    Spouse name: Not on file  . Number of children: Not on file  . Years of education: Not on file  . Highest education level: Not on file  Social Needs  . Financial resource strain: Not on file  . Food insecurity - worry: Not on file  . Food insecurity - inability: Not on file  . Transportation needs - medical: Not on file  . Transportation needs - non-medical: Not on file  Occupational History  . Occupation: Risk analyst  Tobacco Use  . Smoking status: Never Smoker  . Smokeless tobacco: Never Used  Substance and Sexual Activity  . Alcohol use: Yes    Alcohol/week: 1.0 oz    Types: 2 Standard drinks  or equivalent per week    Comment: socially  . Drug use: No  . Sexual activity: Not on file  Other Topics Concern  . Not on file  Social History Narrative  . Not on file    FAMILY HISTORY: Family History  Problem Relation Age of Onset  . Diabetes Mother   . Asthma Mother   . Gout Brother   . Colon cancer Paternal Uncle   . Esophageal cancer Neg Hx   . Stomach cancer Neg Hx     Review of Systems  Constitutional: Positive for fatigue (mild, chemotherapy  related). Negative for appetite change, chills, fever and unexpected weight change.  HENT:   Negative for hearing loss and lump/mass.   Eyes: Negative for eye problems and icterus.  Respiratory: Negative for chest tightness, cough, shortness of breath and wheezing.   Cardiovascular: Negative for chest pain, leg swelling and palpitations.  Gastrointestinal: Negative for abdominal distention, abdominal pain, constipation, diarrhea, nausea and vomiting.  Endocrine: Negative for hot flashes.  Genitourinary: Negative for difficulty urinating.   Musculoskeletal: Negative for arthralgias.  Skin: Negative for itching, rash and wound.      PHYSICAL EXAMINATION  ECOG PERFORMANCE STATUS: 1 - Symptomatic but completely ambulatory  Vitals:   05/01/17 0843  BP: 135/76  Pulse: (!) 125  Resp: 16  Temp: 97.8 F (36.6 C)  SpO2: 100%    Physical Exam  Constitutional: He is oriented to person, place, and time and well-developed, well-nourished, and in no distress.  HENT:  Head: Normocephalic and atraumatic.  Mouth/Throat: Oropharynx is clear and moist. No oropharyngeal exudate.  Eyes: No scleral icterus.  Neck: Neck supple.  Cardiovascular: Regular rhythm and normal heart sounds.  Tachycardic, but regular at 110  Pulmonary/Chest: Effort normal and breath sounds normal. No respiratory distress. He has no wheezes. He has no rales.  Abdominal: Soft. Bowel sounds are normal. He exhibits no distension. There is no tenderness. There is no rebound.  Musculoskeletal: He exhibits no edema.  Lymphadenopathy:    He has no cervical adenopathy.  Neurological: He is alert and oriented to person, place, and time.  Skin: Skin is warm and dry. No rash noted.    LABORATORY DATA:  CBC    Component Value Date/Time   WBC 16.1 (H) 05/01/2017 0815   WBC 6.5 02/17/2017 0930   RBC 3.16 (L) 05/01/2017 0815   RBC 4.12 (L) 02/17/2017 0930   HGB 8.6 (L) 05/01/2017 0815   HCT 27.6 (L) 05/01/2017 0815   PLT 30  (L) 05/01/2017 0815   MCV 87.3 05/01/2017 0815   MCH 27.2 05/01/2017 0815   MCH 27.9 02/17/2017 0930   MCHC 31.2 (L) 05/01/2017 0815   MCHC 33.2 02/17/2017 0930   RDW 18.4 (H) 05/01/2017 0815   LYMPHSABS 1.0 05/01/2017 0815   MONOABS 1.8 (H) 05/01/2017 0815   EOSABS 0.0 05/01/2017 0815   BASOSABS 0.3 (H) 05/01/2017 0815    CMP     Component Value Date/Time   NA 137 05/01/2017 0815   K 3.6 05/01/2017 0815   CL 99 (L) 01/20/2017 1138   CO2 26 05/01/2017 0815   GLUCOSE 223 (H) 05/01/2017 0815   BUN 5.6 (L) 05/01/2017 0815   CREATININE 0.7 05/01/2017 0815   CALCIUM 8.8 05/01/2017 0815   PROT 6.1 (L) 05/01/2017 0815   ALBUMIN 3.4 (L) 05/01/2017 0815   AST 24 05/01/2017 0815   ALT 25 05/01/2017 0815   ALKPHOS 256 (H) 05/01/2017 0815   BILITOT 0.40 05/01/2017  0815   GFRNONAA >60 01/20/2017 1138   GFRAA >60 01/20/2017 1138      RADIOGRAPHIC STUDIES:  Nm Pet Image Restag (ps) Skull Base To Thigh  Result Date: 04/30/2017 CLINICAL DATA:  Subsequent treatment strategy for stage IV high-grade peripheral T-cell lymphoma status post chemotherapy. EXAM: NUCLEAR MEDICINE PET SKULL BASE TO THIGH TECHNIQUE: 8.7 mCi F-18 FDG was injected intravenously. Full-ring PET imaging was performed from the skull base to thigh after the radiotracer. CT data was obtained and used for attenuation correction and anatomic localization. Mediastinal Blood Pool Activity (max SUV): 2.0 FASTING BLOOD GLUCOSE:  Value: 165 mg/dl COMPARISON:  01/29/2017 PET-CT. FINDINGS: NECK: No new or residual hypermetabolic lymph nodes in the neck. Previously visualized bilateral hypermetabolic cervical lymph nodes have resolved. CHEST: No new or residual hypermetabolic axillary, mediastinal or hilar lymph nodes. Previously visualized hypermetabolic bilateral mediastinal and bilateral hilar lymph nodes have resolved. New small pericardial effusion with associated mild nonfocal hypermetabolism. No pneumothorax. New trace dependent  left pleural effusion. No right pleural effusion. No acute consolidative airspace disease, lung masses or significant pulmonary nodules. New scattered mildly thick parenchymal bands in the lingula, which could represent mild scarring or atelectasis. Right internal jugular MediPort terminates at the cavoatrial junction. Coronary atherosclerosis. Atherosclerotic nonaneurysmal thoracic aorta. ABDOMEN/PELVIS: No new or residual hypermetabolic lymph nodes in the abdomen or pelvis. Previously visualized hypermetabolic retroperitoneal, mesenteric and bilateral pelvic lymph nodes are substantially decreased in size (all subcentimeter in size) and demonstrate no residual hypermetabolism. Spleen is decreased and now normal in size (craniocaudal splenic length 10.3 cm, previously 13.7 cm). Previously visualized discrete hypermetabolic splenic lesions have resolved. No new hypermetabolic splenic lesions. Uniform mild splenic hypermetabolism with max SUV 4.1, decreased from max SUV 5.5 on the prior PET-CT. No abnormal hypermetabolic activity within the liver, pancreas, adrenal glands, or spleen. Nonobstructing 2 mm stones in the mid and lower right kidney. No hydronephrosis. Atherosclerotic nonaneurysmal abdominal aorta. Stable mildly enlarged prostate with nonspecific internal prostatic calcifications. Vasectomy clips are noted in the upper scrotum bilaterally. SKELETON: There is uniform hypermetabolism throughout the axial and proximal appendicular skeleton with representative max SUV 6.6 in the L5 vertebral body (compared to max SUV 12.5 on prior PET-CT), without focal osseous hypermetabolism. Previously visualized widespread foci of skeletal hypermetabolism in the axial and proximal appendicular skeleton are not discretely apparent on today's scan. IMPRESSION: 1. Probable complete metabolic response. No residual hypermetabolic adenopathy in the neck, chest, abdomen or pelvis. No residual discrete hypermetabolic splenic or  osseous lesions. 2. Spleen is decreased and now normal in size. Uniform mild splenic hypermetabolism is nonspecific and more likely reactive. Uniform hypermetabolism throughout the axial and proximal appendicular skeleton is nonspecific and more likely due to a reactive marrow state from recent chemotherapy. These findings can be reassessed on follow-up PET-CT. 3. New small pericardial effusion with associated mild nonfocal hypermetabolism, cannot exclude infectious or inflammatory pericarditis. 4. New trace dependent left pleural effusion. 5. Chronic findings include: Aortic Atherosclerosis (ICD10-I70.0). Coronary atherosclerosis. Punctate nonobstructing right renal stones. Mild prostatomegaly. These results will be called to the ordering clinician or representative by the Radiologist Assistant, and communication documented in the PACS or zVision Dashboard. Electronically Signed   By: Ilona Sorrel M.D.   On: 04/30/2017 11:23      ASSESSMENT and PLAN:   Mature T/NK-cell lymphomas, unspecified, lymph nodes of multiple sites ([place]) Joseph Powell is receiving chemotherapy with R CHOEP.  Today is cycle 4 day 10.  He tolerated treatment well.  His hemoglobin  is 8.6 today, which is improved from 7.3 the day he started his treatment last week.    I reviewed his PET scan with him.  It is excellent news that he has had a probable complete metabolic response.  However, he has a new small pericardial effusion.  He is slightly tachycardic.  I got an EKG today that was reassuring.  It demonstrated sinus tachycardia.  Due to this new issue and the fact that he has received Doxorubicin with his treatment, I will discuss his case with [name]/[name] and send them his EKG.  Also, he will f/u with [name] at his next appointment.  He has no signs of pericarditis at this point.  I did review with him reasons to call us, and reasons to seek immediate medical care.    All questions were answered. The patient  knows to call the clinic with any problems, questions or concerns. We can certainly see the patient much sooner if necessary.  A total of (30) minutes of face-to-face time was spent with this patient with greater than 50% of that time in counseling and care-coordination.  This note was electronically signed. Scot Dock, NP 05/01/2017

## 2017-05-01 NOTE — Assessment & Plan Note (Signed)
Joseph Powell is receiving chemotherapy with R CHOEP.  Today is cycle 4 day 10.  He tolerated treatment well.  His hemoglobin is 8.6 today, which is improved from 7.3 the day he started his treatment last week.    I reviewed his PET scan with him.  It is excellent news that he has had a probable complete metabolic response.  However, he has a new small pericardial effusion.  He is slightly tachycardic.  I got an EKG today that was reassuring.  It demonstrated sinus tachycardia.  Due to this new issue and the fact that he has received Doxorubicin with his treatment, I will discuss his case with [name]/[name] and send them his EKG.  Also, he will f/u with [name] at his next appointment.  He has no signs of pericarditis at this point.  I did review with him reasons to call us, and reasons to seek immediate medical care.

## 2017-05-04 ENCOUNTER — Telehealth: Payer: Self-pay | Admitting: Adult Health

## 2017-05-04 DIAGNOSIS — M9903 Segmental and somatic dysfunction of lumbar region: Secondary | ICD-10-CM | POA: Diagnosis not present

## 2017-05-04 DIAGNOSIS — M62830 Muscle spasm of back: Secondary | ICD-10-CM | POA: Diagnosis not present

## 2017-05-04 DIAGNOSIS — M545 Low back pain: Secondary | ICD-10-CM | POA: Diagnosis not present

## 2017-05-04 DIAGNOSIS — M5137 Other intervertebral disc degeneration, lumbosacral region: Secondary | ICD-10-CM | POA: Diagnosis not present

## 2017-05-04 NOTE — Telephone Encounter (Signed)
Called patient and informed him of my correspondence with Dr. Aundra Dubin about his small pericardial effusion and chemotherapy regimen.  Dr. Aundra Dubin will see him in office, and his office staff are handling the details.  I gave patient this information and let him know to expect a call from their office.  He verbalized understanding and appreciation.  Joseph Bihari, NP

## 2017-05-07 ENCOUNTER — Telehealth (HOSPITAL_COMMUNITY): Payer: Self-pay | Admitting: Vascular Surgery

## 2017-05-07 NOTE — Telephone Encounter (Signed)
Left pt message top make NP appt w/ echo w/ [facility]

## 2017-05-08 NOTE — Progress Notes (Signed)
HEMATOLOGY/ONCOLOGY CLINIC NOTE  Date of Service: 05/11/2017  Patient Care Team: Hulan Fess, MD as PCP - General (Family Medicine)  CHIEF COMPLAINTS/PURPOSE OF CONSULTATION:   F/u for newly diagnosed T cell NHL  CURRENT THERAPY: R-CHOEP s/p 4 cycles   HISTORY OF PRESENTING ILLNESS:  See previous note for details  INTERVAL HISTORY   Joseph Powell is a wonderful 62 y.o. male here for his scheduled followup prior C5D1 of chemotherapy. He presents to the clinic today accompanied by his wife.   He notes he has been doing well after 4 cycles of treatment. He notes he has an ECHO on 05/26/17 to monitor pericardial effusion.  He notes he is eating well, but his wife thinks it could be better. He is still on fluconazole with no recurrent thrush. He notes in the morning having congestion from laying down. He notes having urinary urgency. He will barely make it at times. He notes this increased with treatments. He has been trying to drink enough water. He notes his BG at home has been running 160 and sometimes into low 200s when high. His wife notes his ankles have been swelling more often after sitting for a while. Will see Dr. Duanne Moron about transplant prep and after care.  No other new toxicity from treatment at this time. His PET/CT after 4 cycles of treatment show excellent response to treatment.  On review of symptoms, pt notes mild chest pain that occurs occasionally. He denies abdominal pain that would associate with a kidney stone. He notes congestion in the morning. Urinary urgency. He denies dysuria. Occasional ankle swelling.       MEDICAL HISTORY:  Past Medical History:  Diagnosis Date  . Allergic rhinitis   . Asthma    slight  . Bell's palsy   . GERD (gastroesophageal reflux disease)   . Gout   . Hypercholesteremia   . Melanoma ([place])   . Osteoarthritis   . Sleep apnea    Resolving  . Thrombocytopenia ([place])   . Tubular adenoma     SURGICAL HISTORY: Past  Surgical History:  Procedure Laterality Date  . IR FLUORO GUIDE PORT INSERTION RIGHT  02/17/2017  . IR US GUIDE VASC ACCESS RIGHT  02/17/2017  . KNEE SURGERY  1993  . MELANOMA EXCISION    . NASAL RECONSTRUCTION    . VASECTOMY  1999    SOCIAL HISTORY: Social History   Socioeconomic History  . Marital status: Married    Spouse name: Not on file  . Number of children: Not on file  . Years of education: Not on file  . Highest education level: Not on file  Social Needs  . Financial resource strain: Not on file  . Food insecurity - worry: Not on file  . Food insecurity - inability: Not on file  . Transportation needs - medical: Not on file  . Transportation needs - non-medical: Not on file  Occupational History  . Occupation: Risk analyst  Tobacco Use  . Smoking status: Never Smoker  . Smokeless tobacco: Never Used  Substance and Sexual Activity  . Alcohol use: Yes    Alcohol/week: 1.0 oz    Types: 2 Standard drinks or equivalent per week    Comment: socially  . Drug use: No  . Sexual activity: Not on file  Other Topics Concern  . Not on file  Social History Narrative  . Not on file    FAMILY HISTORY: Family History  Problem Relation Age of Onset  .  Diabetes Mother   . Asthma Mother   . Gout Brother   . Colon cancer Paternal Uncle   . Esophageal cancer Neg Hx   . Stomach cancer Neg Hx     ALLERGIES:  is allergic to penicillins.  MEDICATIONS:  Current Outpatient Medications  Medication Sig Dispense Refill  . albuterol (PROVENTIL HFA;VENTOLIN HFA) 108 (90 Base) MCG/ACT inhaler Inhale 1-2 puffs into the lungs every 6 (six) hours as needed for wheezing or shortness of breath.    . chlorpheniramine (CHLOR-TRIMETON) 4 MG tablet Take 4 mg every 4 (four) hours as needed by mouth for allergies.    Marland Kitchen dronabinol (MARINOL) 5 MG capsule Take 1 capsule (5 mg total) by mouth 2 (two) times daily before a meal. 60 capsule 0  . fluconazole (DIFLUCAN) 200 MG tablet Take 1  tablet (200 mg total) by mouth daily. Hold 24h prior to chemotherapy 30 tablet 2  . lidocaine-prilocaine (EMLA) cream Apply to affected area once 30 g 3  . mometasone-formoterol (DULERA) 100-5 MCG/ACT AERO Inhale 2 puffs into the lungs 2 (two) times daily. 1 Inhaler 11  . pantoprazole (PROTONIX) 40 MG tablet Take 40 mg by mouth daily.     . predniSONE (DELTASONE) 20 MG tablet Take 3 tablets (60 mg total) by mouth daily. Take on days 1-5 of chemotherapy. 15 tablet 5  . traZODone (DESYREL) 50 MG tablet Take 1-2 tablets (50-100 mg total) by mouth at bedtime. 60 tablet 2   Current Facility-Administered Medications  Medication Dose Route Frequency Provider Last Rate Last Dose  . 0.9 %  sodium chloride infusion  500 mL Intravenous Continuous Milus Banister, MD       Facility-Administered Medications Ordered in Other Visits  Medication Dose Route Frequency Provider Last Rate Last Dose  . sodium chloride flush (NS) 0.9 % injection 10 mL  10 mL Intracatheter PRN Brunetta Genera, MD   10 mL at 05/12/17 1247    REVIEW OF SYSTEMS:    10 Point review of Systems was done is negative except as noted above.  PHYSICAL EXAMINATION: ECOG PERFORMANCE STATUS: 1 - Symptomatic but completely ambulatory  . Vitals:   05/11/17 1028  BP: 135/77  Pulse: (!) 116  Resp: 18  Temp: 97.8 F (36.6 C)  SpO2: 100%   Filed Weights   05/11/17 1028  Weight: 174 lb 3.2 oz (79 kg)   .Body mass index is 24.3 kg/m.  GENERAL:alert, in no acute distress and comfortable SKIN: no acute rashes, no significant lesions EYES: conjunctiva are pink and non-injected, sclera anicteric OROPHARYNX: MMM, no exudates, no oropharyngeal erythema or ulceration NECK: supple, no JVD LYMPH:  (+) 2 palpable lymphadenopathy in the cervical left lower neck and no obvious lymphadenopathy in axillary or inguinal regions that I could appreciate LUNGS: clear to auscultation b/l with normal respiratory effort HEART: regular rate &  rhythm ABDOMEN:  normoactive bowel sounds , non tender, not distended. Borderline palpable splenomegaly, no overt hepatomegaly.  Extremity: no pedal edema PSYCH: alert & oriented x 3 with fluent speech NEURO: no focal motor/sensory deficits  LABORATORY DATA:  I have reviewed the data as listed Component     Latest Ref Rng & Units 05/11/2017  WBC Count     4.0 - 10.3 K/uL 16.8 (H)  RBC     4.20 - 5.82 MIL/uL 3.38 (L)  Hemoglobin     13.0 - 17.1 g/dL 9.2 (L)  HCT     38.4 - 49.9 % 29.1 (L)  MCV  79.3 - 98.0 fL 85.9  MCH     27.2 - 33.4 pg 27.2  MCHC     32.0 - 36.0 g/dL 31.6 (L)  RDW     11.0 - 15.6 % 20.4 (H)  Platelet Count     140 - 400 K/uL 87 (L)  Neutrophils     % 66  Lymphocytes     % 14  Monocytes Relative     % 20  Eosinophil     % 0  Basophil     % 0  NEUT#     1.5 - 6.5 K/uL 11.0 (H)  Lymphocyte #     0.9 - 3.3 K/uL 2.4  Monocyte #     0.1 - 0.9 K/uL 3.4 (H)  Eosinophils Absolute     0.0 - 0.5 K/uL 0.0  Basophils Absolute     0.0 - 0.1 K/uL 0.0  Sodium     136 - 145 mmol/L 140  Potassium     3.5 - 5.1 mmol/L 4.3  Chloride     98 - 109 mmol/L 104  CO2     22 - 29 mmol/L 27  Glucose     70 - 140 mg/dL 187 (H)  BUN     7 - 26 mg/dL 8  Creatinine     0.70 - 1.30 mg/dL 0.72  Calcium     8.4 - 10.4 mg/dL 9.4  Total Protein     6.4 - 8.3 g/dL 6.2 (L)  Albumin     3.5 - 5.0 g/dL 3.4 (L)  AST     5 - 34 U/L 16  ALT     0 - 55 U/L 18  Alkaline Phosphatase     40 - 150 U/L 266 (H)  Total Bilirubin     0.2 - 1.2 mg/dL 0.5  GFR, Est Non Af Am     >60 mL/min >60  GFR, Est AFR Am     >60 mL/min >60  Anion gap     3 - 11 9  LDH     125 - 245 U/L 299 (H)   . CBC Latest Ref Rng & Units 05/11/2017 05/01/2017 04/21/2017  WBC 4.0 - 10.3 10e3/uL - 16.1(H) 16.5(H)  Hemoglobin 13.0 - 17.1 g/dL - 8.6(L) 8.0(L)  Hematocrit 38.4 - 49.9 % 29.1(L) 27.6(L) 25.9(L)  Platelets 140 - 400 10e3/uL - 30(L) 109(L)    . CMP Latest Ref Rng & Units  05/01/2017 04/21/2017 04/20/2017  Glucose 70 - 140 mg/dl 223(H) 214(H) 208(H)  BUN 7.0 - 26.0 mg/dL 5.6(L) 9.3 7.7  Creatinine 0.7 - 1.3 mg/dL 0.7 0.7 0.7  Sodium 136 - 145 mEq/L 137 139 136  Potassium 3.5 - 5.1 mEq/L 3.6 4.1 3.9  Chloride 101 - 111 mmol/L - - -  CO2 22 - 29 mEq/L _0 Calcium 8.4 - 10.4 mg/dL 8.8 9.6 8.9  Total Protein 6.4 - 8.3 g/dL 6.1(L) 6.3(L) 5.7(L)  Total Bilirubin 0.20 - 1.20 mg/dL 0.40 0.50 0.50  Alkaline Phos 40 - 150 U/L 256(H) 192(H) 168(H)  AST 5 - 34 U/L _1 ALT 0 - 55 U/L _2 Uric acid 3.1     RADIOGRAPHIC STUDIES:  I have personally reviewed the radiological images as listed and agreed with the findings in the report. Nm Pet Image Restag (ps) Skull Base To Thigh  Result Date: 04/30/2017 CLINICAL DATA:  Subsequent treatment strategy for stage IV high-grade peripheral T-cell  lymphoma status post chemotherapy. EXAM: NUCLEAR MEDICINE PET SKULL BASE TO THIGH TECHNIQUE: 8.7 mCi F-18 FDG was injected intravenously. Full-ring PET imaging was performed from the skull base to thigh after the radiotracer. CT data was obtained and used for attenuation correction and anatomic localization. Mediastinal Blood Pool Activity (max SUV): 2.0 FASTING BLOOD GLUCOSE:  Value: 165 mg/dl COMPARISON:  01/29/2017 PET-CT. FINDINGS: NECK: No new or residual hypermetabolic lymph nodes in the neck. Previously visualized bilateral hypermetabolic cervical lymph nodes have resolved. CHEST: No new or residual hypermetabolic axillary, mediastinal or hilar lymph nodes. Previously visualized hypermetabolic bilateral mediastinal and bilateral hilar lymph nodes have resolved. New small pericardial effusion with associated mild nonfocal hypermetabolism. No pneumothorax. New trace dependent left pleural effusion. No right pleural effusion. No acute consolidative airspace disease, lung masses or significant pulmonary nodules. New scattered mildly thick parenchymal bands in the  lingula, which could represent mild scarring or atelectasis. Right internal jugular MediPort terminates at the cavoatrial junction. Coronary atherosclerosis. Atherosclerotic nonaneurysmal thoracic aorta. ABDOMEN/PELVIS: No new or residual hypermetabolic lymph nodes in the abdomen or pelvis. Previously visualized hypermetabolic retroperitoneal, mesenteric and bilateral pelvic lymph nodes are substantially decreased in size (all subcentimeter in size) and demonstrate no residual hypermetabolism. Spleen is decreased and now normal in size (craniocaudal splenic length 10.3 cm, previously 13.7 cm). Previously visualized discrete hypermetabolic splenic lesions have resolved. No new hypermetabolic splenic lesions. Uniform mild splenic hypermetabolism with max SUV 4.1, decreased from max SUV 5.5 on the prior PET-CT. No abnormal hypermetabolic activity within the liver, pancreas, adrenal glands, or spleen. Nonobstructing 2 mm stones in the mid and lower right kidney. No hydronephrosis. Atherosclerotic nonaneurysmal abdominal aorta. Stable mildly enlarged prostate with nonspecific internal prostatic calcifications. Vasectomy clips are noted in the upper scrotum bilaterally. SKELETON: There is uniform hypermetabolism throughout the axial and proximal appendicular skeleton with representative max SUV 6.6 in the L5 vertebral body (compared to max SUV 12.5 on prior PET-CT), without focal osseous hypermetabolism. Previously visualized widespread foci of skeletal hypermetabolism in the axial and proximal appendicular skeleton are not discretely apparent on today's scan. IMPRESSION: 1. Probable complete metabolic response. No residual hypermetabolic adenopathy in the neck, chest, abdomen or pelvis. No residual discrete hypermetabolic splenic or osseous lesions. 2. Spleen is decreased and now normal in size. Uniform mild splenic hypermetabolism is nonspecific and more likely reactive. Uniform hypermetabolism throughout the axial and  proximal appendicular skeleton is nonspecific and more likely due to a reactive marrow state from recent chemotherapy. These findings can be reassessed on follow-up PET-CT. 3. New small pericardial effusion with associated mild nonfocal hypermetabolism, cannot exclude infectious or inflammatory pericarditis. 4. New trace dependent left pleural effusion. 5. Chronic findings include: Aortic Atherosclerosis (ICD10-I70.0). Coronary atherosclerosis. Punctate nonobstructing right renal stones. Mild prostatomegaly. These results will be called to the ordering clinician or representative by the Radiologist Assistant, and communication documented in the PACS or zVision Dashboard. Electronically Signed   By: Ilona Sorrel M.D.   On: 04/30/2017 11:23    ASSESSMENT & PLAN:    Joseph Powell is a 62 y.o. 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 Splenomegaly with numerous low-attenuation splenic masses. Several small low-attenuation liver masses. The leading consideration is lymphoma, although metastatic disease with unknown primary in on the differential. S/p 4 cycles - PET scan was completed on 04/30/17 and showed probable complete metabolic response. However, it  also showed he has a new small pericardial effusion. Noted to have slight tachycardia which was confirmed by EKG on 05/01/17.(liekly from Anemia)  Resolution of consitutional symptoms and cervical lymphadenopathy.  #3 Acute on Chronic Thrombocytopenia. Patient has some chronic thrombocytopenia 85-125k for a few years. Platelets upon initial visit are down to 36k in the setting of newly diagnosed lymphoma and treatment with R-CHOEP and from hypersplenism. PLt counts have now lowered to 87K.   #4 Elevated ALK PO4 - stable - likely from ctx or bone involvement.  # 5 Anemia from NHL+ Chemotherapy improved  from 7.9 to 9.3, now stable at 9.2  # 6 Mild neutropenia - resolved with Neulasta, ANC now 3.38  # 7 Significant Oro-pharyngeal thrush -- causing low grade fever and change in taste -- treated aggressively with Nystatin mouthwashes and PO Fluconazole. Resolved -- continuing fluconazole prophylaxis.  Plan - overall appears to be have tolerated R-CHOEP relatively well.  -previous thrush resolved - continue po fluconazole for prophylaxis. -His counts have mildly improved but overall been stable and will hopefully bounce with his continued treatment. He has been mildly anemic but this has held overall, platelets are slightly lower at 87K today -he has recommended to seek immediate help for any fevers. -S/p 4th cycle R-CHOEP PET scan was completed on 04/30/17 and showed probable complete metabolic response. However, it also showed he has a new small pericardial effusion.  -patient previously seen Dr Duanne Moron for 2nd opinion at [facility] - -- plan for patient to be considered for auto HSCT especially now that he has achieved CR response to treatment. I broadly discussed his transplant process.  -mx of hyperglycemia per PCP -He will have ECHO to monitor his pericardial effusion on 05/26/17.  -He also notes to ankle swelling. I discussed this can be from his fluid retention and can contribute to his urinary urgency. I recommend compression socks and feet elevation when sitting down.    #2 History of Melanoma, 2009 -Followed by Dr. Allyson Sabal for continued skin screening   #3 Patient Active Problem List   Diagnosis Date Noted  . Peripheral T cell lymphoma of intrathoracic lymph nodes ([place]) 03/11/2017  . Counseling regarding advanced care planning and goals of care 02/12/2017  . Mature T/NK-cell lymphomas, unspecified, lymph nodes of multiple sites ([place]) 02/10/2017  . Cough variant asthma vs UACS 01/27/2017  . Dyslipidemia 08/05/2016  . Mild asthma 08/05/2016  . DJD (degenerative joint disease) 08/05/2016    . HLD (hyperlipidemia) 07/27/2016  . GERD (gastroesophageal reflux disease) 07/27/2016  . Gout 07/27/2016  . Discomfort in chest 07/27/2016  . OSA (obstructive sleep apnea) 09/19/2013  . Thrombocytopenia ([place]) 04/14/2011  . SCHATZKI'S RING 08/21/2009   #4 Left sided jaw pain resolved  #5 Urinary urgency  -I recommend a urinalysis to r/o infection, he agreed. If results show infection I will prescribe antibiotics.    Labs for UA/UCx today Please complete C5 of treatment as ordered C6 of R-CHOEP in 3 weeks as ordered with Neulasta support Labs D10-11 to monitor counts Plz schedule f/u with Dr Irene Limbo in 3 weeks with labs with C6D1 of treatment   All of the patients questions were answered with apparent satisfaction. The patient knows to call the clinic with any problems, questions or concerns.  I spent 20 minutes counseling the patient face to face. The total time spent in the appointment was 25 minutes and more than 50% was on counseling and direct patient cares.    Sullivan Lone MD MS AAHIVMS  [facility] Hematology/Oncology Physician [place]  (Office):       [phone] (Work cell):  [phone] (Fax):           [phone]  05/11/2017 10:47 AM   This document serves as a record of services personally performed by Sullivan Lone, MD. It was created on his behalf by Joslyn Devon, a trained medical scribe. The creation of this record is based on the scribe's personal observations and the provider's statements to them.     .I have reviewed the above documentation for accuracy and completeness, and I agree with the above.  Brunetta Genera MD MS

## 2017-05-11 ENCOUNTER — Inpatient Hospital Stay: Payer: BLUE CROSS/BLUE SHIELD

## 2017-05-11 ENCOUNTER — Encounter: Payer: Self-pay | Admitting: Hematology

## 2017-05-11 ENCOUNTER — Other Ambulatory Visit: Payer: Self-pay | Admitting: *Deleted

## 2017-05-11 ENCOUNTER — Inpatient Hospital Stay: Payer: BLUE CROSS/BLUE SHIELD | Admitting: Hematology

## 2017-05-11 ENCOUNTER — Inpatient Hospital Stay: Payer: BLUE CROSS/BLUE SHIELD | Attending: Hematology

## 2017-05-11 VITALS — BP 135/77 | HR 116 | Temp 97.8°F | Resp 18 | Ht 71.0 in | Wt 174.2 lb

## 2017-05-11 DIAGNOSIS — C8442 Peripheral T-cell lymphoma, not classified, intrathoracic lymph nodes: Secondary | ICD-10-CM

## 2017-05-11 DIAGNOSIS — J45909 Unspecified asthma, uncomplicated: Secondary | ICD-10-CM | POA: Diagnosis not present

## 2017-05-11 DIAGNOSIS — C8448 Peripheral T-cell lymphoma, not classified, lymph nodes of multiple sites: Secondary | ICD-10-CM | POA: Insufficient documentation

## 2017-05-11 DIAGNOSIS — E7800 Pure hypercholesterolemia, unspecified: Secondary | ICD-10-CM

## 2017-05-11 DIAGNOSIS — D696 Thrombocytopenia, unspecified: Secondary | ICD-10-CM | POA: Diagnosis not present

## 2017-05-11 DIAGNOSIS — R3915 Urgency of urination: Secondary | ICD-10-CM | POA: Insufficient documentation

## 2017-05-11 DIAGNOSIS — Z5111 Encounter for antineoplastic chemotherapy: Secondary | ICD-10-CM | POA: Diagnosis not present

## 2017-05-11 DIAGNOSIS — C8499 Mature T/NK-cell lymphomas, unspecified, extranodal and solid organ sites: Secondary | ICD-10-CM

## 2017-05-11 DIAGNOSIS — D649 Anemia, unspecified: Secondary | ICD-10-CM

## 2017-05-11 DIAGNOSIS — K219 Gastro-esophageal reflux disease without esophagitis: Secondary | ICD-10-CM

## 2017-05-11 DIAGNOSIS — Z5112 Encounter for antineoplastic immunotherapy: Secondary | ICD-10-CM | POA: Diagnosis not present

## 2017-05-11 DIAGNOSIS — D6481 Anemia due to antineoplastic chemotherapy: Secondary | ICD-10-CM | POA: Insufficient documentation

## 2017-05-11 DIAGNOSIS — Z85820 Personal history of malignant melanoma of skin: Secondary | ICD-10-CM | POA: Insufficient documentation

## 2017-05-11 DIAGNOSIS — Z7189 Other specified counseling: Secondary | ICD-10-CM

## 2017-05-11 DIAGNOSIS — I313 Pericardial effusion (noninflammatory): Secondary | ICD-10-CM | POA: Diagnosis not present

## 2017-05-11 DIAGNOSIS — R000 Tachycardia, unspecified: Secondary | ICD-10-CM | POA: Diagnosis not present

## 2017-05-11 DIAGNOSIS — J209 Acute bronchitis, unspecified: Secondary | ICD-10-CM | POA: Insufficient documentation

## 2017-05-11 DIAGNOSIS — C8498 Mature T/NK-cell lymphomas, unspecified, lymph nodes of multiple sites: Secondary | ICD-10-CM

## 2017-05-11 DIAGNOSIS — R300 Dysuria: Secondary | ICD-10-CM

## 2017-05-11 LAB — CMP (CANCER CENTER ONLY)
ALT: 18 U/L (ref 0–55)
AST: 16 U/L (ref 5–34)
Albumin: 3.4 g/dL — ABNORMAL LOW (ref 3.5–5.0)
Alkaline Phosphatase: 266 U/L — ABNORMAL HIGH (ref 40–150)
Anion gap: 9 (ref 3–11)
BUN: 8 mg/dL (ref 7–26)
CHLORIDE: 104 mmol/L (ref 98–109)
CO2: 27 mmol/L (ref 22–29)
CREATININE: 0.72 mg/dL (ref 0.70–1.30)
Calcium: 9.4 mg/dL (ref 8.4–10.4)
GFR, Est AFR Am: >60 mL/min (ref >60)
GFR, Estimated: >60 mL/min (ref >60)
Glucose, Bld: 187 mg/dL — ABNORMAL HIGH (ref 70–140)
Potassium: 4.3 mmol/L (ref 3.5–5.1)
Sodium: 140 mmol/L (ref 136–145)
Total Bilirubin: 0.5 mg/dL (ref 0.2–1.2)
Total Protein: 6.2 g/dL — ABNORMAL LOW (ref 6.4–8.3)

## 2017-05-11 LAB — LACTATE DEHYDROGENASE: LDH: 299 U/L — AB (ref 125–245)

## 2017-05-11 LAB — CBC WITH DIFFERENTIAL (CANCER CENTER ONLY)
BASOS ABS: 0 10*3/uL (ref 0.0–0.1)
Basophils Relative: 0 %
Eosinophils Absolute: 0 10*3/uL (ref 0.0–0.5)
Eosinophils Relative: 0 %
HCT: 29.1 % — ABNORMAL LOW (ref 38.4–49.9)
Hemoglobin: 9.2 g/dL — ABNORMAL LOW (ref 13.0–17.1)
LYMPHS ABS: 2.4 10*3/uL (ref 0.9–3.3)
Lymphocytes Relative: 14 %
MCH: 27.2 pg (ref 27.2–33.4)
MCHC: 31.6 g/dL — ABNORMAL LOW (ref 32.0–36.0)
MCV: 85.9 fL (ref 79.3–98.0)
MONOS PCT: 20 %
Monocytes Absolute: 3.4 10*3/uL — ABNORMAL HIGH (ref 0.1–0.9)
NEUTROS PCT: 66 %
Neutro Abs: 11 10*3/uL — ABNORMAL HIGH (ref 1.5–6.5)
PLATELETS: 87 10*3/uL — AB (ref 140–400)
RBC: 3.38 MIL/uL — AB (ref 4.20–5.82)
RDW: 20.4 % — ABNORMAL HIGH (ref 11.0–15.6)
WBC: 16.8 10*3/uL — AB (ref 4.0–10.3)

## 2017-05-11 MED ORDER — VINCRISTINE SULFATE CHEMO INJECTION 1 MG/ML
2.0000 mg | Freq: Once | INTRAVENOUS | Status: AC
  Administered 2017-05-11: 2 mg via INTRAVENOUS
  Filled 2017-05-11: qty 2

## 2017-05-11 MED ORDER — PALONOSETRON HCL INJECTION 0.25 MG/5ML
0.2500 mg | Freq: Once | INTRAVENOUS | Status: AC
  Administered 2017-05-11: 0.25 mg via INTRAVENOUS

## 2017-05-11 MED ORDER — SODIUM CHLORIDE 0.9 % IV SOLN
750.0000 mg/m2 | Freq: Once | INTRAVENOUS | Status: AC
  Administered 2017-05-11: 1540 mg via INTRAVENOUS
  Filled 2017-05-11: qty 77

## 2017-05-11 MED ORDER — SODIUM CHLORIDE 0.9 % IV SOLN
97.0000 mg/m2 | Freq: Once | INTRAVENOUS | Status: AC
  Administered 2017-05-11: 200 mg via INTRAVENOUS
  Filled 2017-05-11: qty 10

## 2017-05-11 MED ORDER — SODIUM CHLORIDE 0.9 % IV SOLN
Freq: Once | INTRAVENOUS | Status: AC
  Administered 2017-05-11: 12:00:00 via INTRAVENOUS

## 2017-05-11 MED ORDER — SODIUM CHLORIDE 0.9% FLUSH
10.0000 mL | INTRAVENOUS | Status: DC | PRN
  Administered 2017-05-11: 10 mL
  Filled 2017-05-11: qty 10

## 2017-05-11 MED ORDER — ACETAMINOPHEN 325 MG PO TABS: 650.0000 mg | ORAL_TABLET | Freq: Once | ORAL | Status: DC

## 2017-05-11 MED ORDER — HEPARIN SOD (PORK) LOCK FLUSH 100 UNIT/ML IV SOLN
500.0000 [IU] | Freq: Once | INTRAVENOUS | Status: AC | PRN
  Administered 2017-05-11: 500 [IU]
  Filled 2017-05-11: qty 5

## 2017-05-11 MED ORDER — PALONOSETRON HCL INJECTION 0.25 MG/5ML
INTRAVENOUS | Status: AC
  Filled 2017-05-11: qty 5

## 2017-05-11 MED ORDER — DIPHENHYDRAMINE HCL 25 MG PO CAPS: 50.0000 mg | ORAL_CAPSULE | Freq: Once | ORAL | Status: DC

## 2017-05-11 MED ORDER — SODIUM CHLORIDE 0.9 % IV SOLN: 375.0000 mg/m2 | Freq: Once | INTRAVENOUS | Status: DC

## 2017-05-11 MED ORDER — DOXORUBICIN HCL CHEMO IV INJECTION 2 MG/ML
50.0000 mg/m2 | Freq: Once | INTRAVENOUS | Status: AC
  Administered 2017-05-11: 102 mg via INTRAVENOUS
  Filled 2017-05-11: qty 51

## 2017-05-11 MED ORDER — ACETAMINOPHEN 325 MG PO TABS
ORAL_TABLET | ORAL | Status: AC
  Filled 2017-05-11: qty 2

## 2017-05-11 MED ORDER — DIPHENHYDRAMINE HCL 25 MG PO CAPS
ORAL_CAPSULE | ORAL | Status: AC
  Filled 2017-05-11: qty 2

## 2017-05-11 MED ORDER — SODIUM CHLORIDE 0.9 % IV SOLN
Freq: Once | INTRAVENOUS | Status: AC
  Administered 2017-05-11: 13:00:00 via INTRAVENOUS
  Filled 2017-05-11: qty 5

## 2017-05-11 NOTE — Patient Instructions (Addendum)
[facility] Discharge Instructions for Patients Receiving Chemotherapy  Today you received the following chemotherapy agents: Etoposide (Vepesid), Doxorubicin (Adriamycin), Vincristine (Oncovin), and Cyclophosphamide (Cytoxan).  To help prevent nausea and vomiting after your treatment, we encourage you to take your nausea medication as prescribed.  If you develop nausea and vomiting that is not controlled by your nausea medication, call the clinic.   BELOW ARE SYMPTOMS THAT SHOULD BE REPORTED IMMEDIATELY:  *FEVER GREATER THAN 100.5 F  *CHILLS WITH OR WITHOUT FEVER  NAUSEA AND VOMITING THAT IS NOT CONTROLLED WITH YOUR NAUSEA MEDICATION  *UNUSUAL SHORTNESS OF BREATH  *UNUSUAL BRUISING OR BLEEDING  TENDERNESS IN MOUTH AND THROAT WITH OR WITHOUT PRESENCE OF ULCERS  *URINARY PROBLEMS  *BOWEL PROBLEMS  UNUSUAL RASH Items with * indicate a potential emergency and should be followed up as soon as possible.  Feel free to call the clinic should you have any questions or concerns. The clinic phone number is [phone].  Please show the [facility] at check-in to the Emergency Department and triage nurse.

## 2017-05-11 NOTE — Patient Instructions (Signed)
Thank you for choosing [facility] to provide your oncology and hematology care.  To afford each patient quality time with our providers, please arrive 30 minutes before your scheduled appointment time.  If you arrive late for your appointment, you may be asked to reschedule.  We strive to give you quality time with our providers, and arriving late affects you and other patients whose appointments are after yours.   If you are a no show for multiple scheduled visits, you may be dismissed from the clinic at the providers discretion.    Again, thank you for choosing [facility], our hope is that these requests will decrease the amount of time that you wait before being seen by our physicians.  ______________________________________________________________________  Should you have questions after your visit to the [facility], please contact our office at [phone] between the hours of 8:30 and 4:30 p.m.    Voicemails left after 4:30p.m will not be returned until the following business day.    For prescription refill requests, please have your pharmacy contact us directly.  Please also try to allow 48 hours for prescription requests.    Please contact the scheduling department for questions regarding scheduling.  For scheduling of procedures such as PET scans, CT scans, MRI, Ultrasound, etc please contact central scheduling at [phone].    Resources For Cancer Patients and Caregivers:   Oncolink.org:  A wonderful resource for patients and healthcare providers for information regarding your disease, ways to tract your treatment, what to expect, etc.     American Cancer Society:  800-227-2345  Can help patients locate various types of support and financial assistance  Cancer Care: 1-800-813-HOPE (4673) Provides financial assistance, online support groups, medication/co-pay assistance.    [place] DSS:  336-641-3447 Where to apply for food  stamps, Medicaid, and utility assistance  Medicare Rights Center: 800-333-4114 Helps people with Medicare understand their rights and benefits, navigate the Medicare system, and secure the quality healthcare they deserve  SCAT: 336-333-6589 [employer] [employer]'s shared-ride transportation service for eligible riders who have a disability that prevents them from riding the fixed route bus.    For additional information on assistance programs please contact our social worker:   Grier Hock/Abigail Elmore:  [phone]            

## 2017-05-11 NOTE — Progress Notes (Signed)
Received paper hard copy of pt's labs: Creat=0.72, AST=15.84, ALT=18.24. Will release treatment plan.

## 2017-05-11 NOTE — Progress Notes (Signed)
Per Dr Irene Limbo, [place] for  Treatment today with PLT of 87.

## 2017-05-12 ENCOUNTER — Other Ambulatory Visit: Payer: Self-pay | Admitting: *Deleted

## 2017-05-12 ENCOUNTER — Other Ambulatory Visit: Payer: Self-pay

## 2017-05-12 ENCOUNTER — Other Ambulatory Visit: Payer: BLUE CROSS/BLUE SHIELD

## 2017-05-12 ENCOUNTER — Inpatient Hospital Stay: Payer: BLUE CROSS/BLUE SHIELD

## 2017-05-12 VITALS — BP 129/83 | HR 99 | Temp 98.2°F | Resp 20

## 2017-05-12 DIAGNOSIS — C8498 Mature T/NK-cell lymphomas, unspecified, lymph nodes of multiple sites: Secondary | ICD-10-CM

## 2017-05-12 DIAGNOSIS — R300 Dysuria: Secondary | ICD-10-CM

## 2017-05-12 DIAGNOSIS — C8448 Peripheral T-cell lymphoma, not classified, lymph nodes of multiple sites: Secondary | ICD-10-CM | POA: Diagnosis not present

## 2017-05-12 DIAGNOSIS — D696 Thrombocytopenia, unspecified: Secondary | ICD-10-CM | POA: Diagnosis not present

## 2017-05-12 DIAGNOSIS — E7800 Pure hypercholesterolemia, unspecified: Secondary | ICD-10-CM | POA: Diagnosis not present

## 2017-05-12 DIAGNOSIS — Z7189 Other specified counseling: Secondary | ICD-10-CM

## 2017-05-12 DIAGNOSIS — Z5111 Encounter for antineoplastic chemotherapy: Secondary | ICD-10-CM | POA: Diagnosis not present

## 2017-05-12 DIAGNOSIS — J209 Acute bronchitis, unspecified: Secondary | ICD-10-CM | POA: Diagnosis not present

## 2017-05-12 DIAGNOSIS — R000 Tachycardia, unspecified: Secondary | ICD-10-CM | POA: Diagnosis not present

## 2017-05-12 DIAGNOSIS — C8442 Peripheral T-cell lymphoma, not classified, intrathoracic lymph nodes: Secondary | ICD-10-CM

## 2017-05-12 DIAGNOSIS — Z5112 Encounter for antineoplastic immunotherapy: Secondary | ICD-10-CM | POA: Diagnosis not present

## 2017-05-12 DIAGNOSIS — Z85820 Personal history of malignant melanoma of skin: Secondary | ICD-10-CM | POA: Diagnosis not present

## 2017-05-12 DIAGNOSIS — I313 Pericardial effusion (noninflammatory): Secondary | ICD-10-CM | POA: Diagnosis not present

## 2017-05-12 DIAGNOSIS — D6481 Anemia due to antineoplastic chemotherapy: Secondary | ICD-10-CM | POA: Diagnosis not present

## 2017-05-12 DIAGNOSIS — R3915 Urgency of urination: Secondary | ICD-10-CM | POA: Diagnosis not present

## 2017-05-12 LAB — URINALYSIS, COMPLETE (UACMP) WITH MICROSCOPIC
BACTERIA UA: NONE SEEN
BILIRUBIN URINE: NEGATIVE
Glucose, UA: 50 mg/dL — AB
Hgb urine dipstick: NEGATIVE
Ketones, ur: NEGATIVE mg/dL
LEUKOCYTES UA: NEGATIVE
Nitrite: NEGATIVE
PH: 6 (ref 5.0–8.0)
Protein, ur: 30 mg/dL — AB
SQUAMOUS EPITHELIAL / LPF: NONE SEEN
Specific Gravity, Urine: 1.018 (ref 1.005–1.030)

## 2017-05-12 MED ORDER — PROCHLORPERAZINE EDISYLATE 5 MG/ML IJ SOLN
10.0000 mg | Freq: Once | INTRAMUSCULAR | Status: AC
  Administered 2017-05-12: 10 mg via INTRAVENOUS

## 2017-05-12 MED ORDER — PROCHLORPERAZINE EDISYLATE 5 MG/ML IJ SOLN
INTRAMUSCULAR | Status: AC
  Filled 2017-05-12: qty 2

## 2017-05-12 MED ORDER — SODIUM CHLORIDE 0.9% FLUSH
10.0000 mL | INTRAVENOUS | Status: DC | PRN
  Administered 2017-05-12: 10 mL
  Filled 2017-05-12: qty 10

## 2017-05-12 MED ORDER — HEPARIN SOD (PORK) LOCK FLUSH 100 UNIT/ML IV SOLN
500.0000 [IU] | Freq: Once | INTRAVENOUS | Status: AC | PRN
  Administered 2017-05-12: 500 [IU]
  Filled 2017-05-12: qty 5

## 2017-05-12 MED ORDER — SODIUM CHLORIDE 0.9 % IV SOLN
98.0000 mg/m2 | Freq: Once | INTRAVENOUS | Status: AC
  Administered 2017-05-12: 200 mg via INTRAVENOUS
  Filled 2017-05-12: qty 10

## 2017-05-12 NOTE — Patient Instructions (Signed)
[facility] Discharge Instructions for Patients Receiving Chemotherapy  Today you received the following chemotherapy agents:  Etoposide.  To help prevent nausea and vomiting after your treatment, we encourage you to take your nausea medication as directed.   If you develop nausea and vomiting that is not controlled by your nausea medication, call the clinic.   BELOW ARE SYMPTOMS THAT SHOULD BE REPORTED IMMEDIATELY:  *FEVER GREATER THAN 100.5 F  *CHILLS WITH OR WITHOUT FEVER  NAUSEA AND VOMITING THAT IS NOT CONTROLLED WITH YOUR NAUSEA MEDICATION  *UNUSUAL SHORTNESS OF BREATH  *UNUSUAL BRUISING OR BLEEDING  TENDERNESS IN MOUTH AND THROAT WITH OR WITHOUT PRESENCE OF ULCERS  *URINARY PROBLEMS  *BOWEL PROBLEMS  UNUSUAL RASH Items with * indicate a potential emergency and should be followed up as soon as possible.  Feel free to call the clinic should you have any questions or concerns. The clinic phone number is [phone].  Please show the CHEMO ALERT CARD at check-in to the Emergency Department and triage nurse.   

## 2017-05-13 ENCOUNTER — Ambulatory Visit: Payer: BLUE CROSS/BLUE SHIELD

## 2017-05-13 ENCOUNTER — Inpatient Hospital Stay: Payer: BLUE CROSS/BLUE SHIELD

## 2017-05-13 ENCOUNTER — Telehealth: Payer: Self-pay | Admitting: Hematology

## 2017-05-13 VITALS — BP 132/83 | HR 100 | Temp 98.0°F | Resp 18

## 2017-05-13 DIAGNOSIS — Z5112 Encounter for antineoplastic immunotherapy: Secondary | ICD-10-CM | POA: Diagnosis not present

## 2017-05-13 DIAGNOSIS — C8498 Mature T/NK-cell lymphomas, unspecified, lymph nodes of multiple sites: Secondary | ICD-10-CM

## 2017-05-13 DIAGNOSIS — R3915 Urgency of urination: Secondary | ICD-10-CM | POA: Diagnosis not present

## 2017-05-13 DIAGNOSIS — E7800 Pure hypercholesterolemia, unspecified: Secondary | ICD-10-CM | POA: Diagnosis not present

## 2017-05-13 DIAGNOSIS — J209 Acute bronchitis, unspecified: Secondary | ICD-10-CM | POA: Diagnosis not present

## 2017-05-13 DIAGNOSIS — D696 Thrombocytopenia, unspecified: Secondary | ICD-10-CM | POA: Diagnosis not present

## 2017-05-13 DIAGNOSIS — C8448 Peripheral T-cell lymphoma, not classified, lymph nodes of multiple sites: Secondary | ICD-10-CM | POA: Diagnosis not present

## 2017-05-13 DIAGNOSIS — D6481 Anemia due to antineoplastic chemotherapy: Secondary | ICD-10-CM | POA: Diagnosis not present

## 2017-05-13 DIAGNOSIS — I313 Pericardial effusion (noninflammatory): Secondary | ICD-10-CM | POA: Diagnosis not present

## 2017-05-13 DIAGNOSIS — Z7189 Other specified counseling: Secondary | ICD-10-CM

## 2017-05-13 DIAGNOSIS — R000 Tachycardia, unspecified: Secondary | ICD-10-CM | POA: Diagnosis not present

## 2017-05-13 DIAGNOSIS — C8442 Peripheral T-cell lymphoma, not classified, intrathoracic lymph nodes: Secondary | ICD-10-CM

## 2017-05-13 DIAGNOSIS — Z85820 Personal history of malignant melanoma of skin: Secondary | ICD-10-CM | POA: Diagnosis not present

## 2017-05-13 DIAGNOSIS — Z5111 Encounter for antineoplastic chemotherapy: Secondary | ICD-10-CM | POA: Diagnosis not present

## 2017-05-13 LAB — URINE CULTURE: Culture: NO GROWTH

## 2017-05-13 MED ORDER — ACETAMINOPHEN 325 MG PO TABS
ORAL_TABLET | ORAL | Status: AC
  Filled 2017-05-13: qty 1

## 2017-05-13 MED ORDER — SODIUM CHLORIDE 0.9 % IV SOLN
Freq: Once | INTRAVENOUS | Status: AC
  Administered 2017-05-13: 14:00:00 via INTRAVENOUS

## 2017-05-13 MED ORDER — SODIUM CHLORIDE 0.9 % IV SOLN
97.0000 mg/m2 | Freq: Once | INTRAVENOUS | Status: AC
  Administered 2017-05-13: 200 mg via INTRAVENOUS
  Filled 2017-05-13: qty 10

## 2017-05-13 MED ORDER — PROCHLORPERAZINE EDISYLATE 5 MG/ML IJ SOLN
INTRAMUSCULAR | Status: AC
  Filled 2017-05-13: qty 2

## 2017-05-13 MED ORDER — SODIUM CHLORIDE 0.9% FLUSH
10.0000 mL | INTRAVENOUS | Status: DC | PRN
  Administered 2017-05-13: 10 mL
  Filled 2017-05-13: qty 10

## 2017-05-13 MED ORDER — PROCHLORPERAZINE MALEATE 10 MG PO TABS
ORAL_TABLET | ORAL | Status: AC
  Filled 2017-05-13: qty 1

## 2017-05-13 MED ORDER — HEPARIN SOD (PORK) LOCK FLUSH 100 UNIT/ML IV SOLN
500.0000 [IU] | Freq: Once | INTRAVENOUS | Status: AC | PRN
  Administered 2017-05-13: 500 [IU]
  Filled 2017-05-13: qty 5

## 2017-05-13 MED ORDER — SODIUM CHLORIDE 0.9 % IV SOLN
375.0000 mg/m2 | Freq: Once | INTRAVENOUS | Status: AC
  Administered 2017-05-13: 700 mg via INTRAVENOUS
  Filled 2017-05-13: qty 50

## 2017-05-13 MED ORDER — PEGFILGRASTIM 6 MG/0.6ML ~~LOC~~ PSKT
6.0000 mg | PREFILLED_SYRINGE | Freq: Once | SUBCUTANEOUS | Status: AC
  Administered 2017-05-13: 6 mg via SUBCUTANEOUS

## 2017-05-13 MED ORDER — DIPHENHYDRAMINE HCL 25 MG PO CAPS
ORAL_CAPSULE | ORAL | Status: AC
  Filled 2017-05-13: qty 2

## 2017-05-13 MED ORDER — PROCHLORPERAZINE EDISYLATE 5 MG/ML IJ SOLN
10.0000 mg | Freq: Once | INTRAMUSCULAR | Status: AC
  Administered 2017-05-13: 10 mg via INTRAVENOUS

## 2017-05-13 MED ORDER — DIPHENHYDRAMINE HCL 25 MG PO CAPS
50.0000 mg | ORAL_CAPSULE | Freq: Once | ORAL | Status: AC
  Administered 2017-05-13: 50 mg via ORAL

## 2017-05-13 MED ORDER — ACETAMINOPHEN 325 MG PO TABS
650.0000 mg | ORAL_TABLET | Freq: Once | ORAL | Status: AC
  Administered 2017-05-13: 650 mg via ORAL

## 2017-05-13 MED ORDER — PEGFILGRASTIM 6 MG/0.6ML ~~LOC~~ PSKT
PREFILLED_SYRINGE | SUBCUTANEOUS | Status: AC
  Filled 2017-05-13: qty 0.6

## 2017-05-13 NOTE — Telephone Encounter (Signed)
Spoke with patient regarding appointment per scheduling message 1/8

## 2017-05-13 NOTE — Patient Instructions (Signed)
[facility] Discharge Instructions for Patients Receiving Chemotherapy  Today you received the following chemotherapy agents Etoposide and Rituxan  To help prevent nausea and vomiting after your treatment, we encourage you to take your nausea medication as directed   If you develop nausea and vomiting that is not controlled by your nausea medication, call the clinic.   BELOW ARE SYMPTOMS THAT SHOULD BE REPORTED IMMEDIATELY:  *FEVER GREATER THAN 100.5 F  *CHILLS WITH OR WITHOUT FEVER  NAUSEA AND VOMITING THAT IS NOT CONTROLLED WITH YOUR NAUSEA MEDICATION  *UNUSUAL SHORTNESS OF BREATH  *UNUSUAL BRUISING OR BLEEDING  TENDERNESS IN MOUTH AND THROAT WITH OR WITHOUT PRESENCE OF ULCERS  *URINARY PROBLEMS  *BOWEL PROBLEMS  UNUSUAL RASH Items with * indicate a potential emergency and should be followed up as soon as possible.  Feel free to call the clinic should you have any questions or concerns. The clinic phone number is [phone].  Please show the [place] at check-in to the Emergency Department and triage nurse.

## 2017-05-19 ENCOUNTER — Other Ambulatory Visit: Payer: Self-pay | Admitting: Hematology

## 2017-05-19 DIAGNOSIS — C8498 Mature T/NK-cell lymphomas, unspecified, lymph nodes of multiple sites: Secondary | ICD-10-CM

## 2017-05-22 ENCOUNTER — Telehealth: Payer: Self-pay

## 2017-05-22 NOTE — Telephone Encounter (Signed)
During conversation regarding appts, wife additionally noted that pt is having some mouth sores on tongue. Has started using magic mouthwash and getting better, but in a different location than typical. Told pt to monitor over the weekend and notify if unresolved on Monday.

## 2017-05-22 NOTE — Telephone Encounter (Signed)
Scheduling message sent to have pt rescheduled with an APP on 1/28 as [name] will be out of the office.

## 2017-05-22 NOTE — Telephone Encounter (Signed)
Confirmed with pt wife, [name], appointment date/time and instructions for [place] and PET scan. BMX scheduled for 1/31 at 0900, but pt to arrive at 0645 for prep, and wife plans to drive pt to and from appointment. PET scan scheduled for 2/4 at 1100, but pt to arrive at 1030. Knowledge of radiology location verbalized by pt wife, and that pt able to take small sip of water with medications in the morning prior to imaging and BMX, but otherwise nothing to eat or drink after midnight the night before.

## 2017-05-25 ENCOUNTER — Telehealth: Payer: Self-pay | Admitting: *Deleted

## 2017-05-25 ENCOUNTER — Inpatient Hospital Stay: Payer: BLUE CROSS/BLUE SHIELD

## 2017-05-25 ENCOUNTER — Other Ambulatory Visit: Payer: Self-pay | Admitting: Hematology

## 2017-05-25 ENCOUNTER — Inpatient Hospital Stay: Payer: BLUE CROSS/BLUE SHIELD | Admitting: Hematology

## 2017-05-25 ENCOUNTER — Ambulatory Visit (HOSPITAL_COMMUNITY)
Admission: RE | Admit: 2017-05-25 | Discharge: 2017-05-25 | Disposition: A | Discharge status: Home / Self Care | Payer: BLUE CROSS/BLUE SHIELD | Source: Ambulatory Visit | Attending: Hematology | Admitting: Hematology

## 2017-05-25 ENCOUNTER — Encounter (HOSPITAL_COMMUNITY): Payer: Self-pay

## 2017-05-25 ENCOUNTER — Telehealth: Payer: Self-pay | Admitting: Nurse Practitioner

## 2017-05-25 VITALS — BP 109/81 | HR 115 | Temp 98.1°F | Resp 18 | Ht 71.0 in | Wt 168.6 lb

## 2017-05-25 DIAGNOSIS — I313 Pericardial effusion (noninflammatory): Secondary | ICD-10-CM | POA: Diagnosis not present

## 2017-05-25 DIAGNOSIS — E785 Hyperlipidemia, unspecified: Secondary | ICD-10-CM | POA: Insufficient documentation

## 2017-05-25 DIAGNOSIS — C8440 Peripheral T-cell lymphoma, not classified, unspecified site: Secondary | ICD-10-CM | POA: Diagnosis not present

## 2017-05-25 DIAGNOSIS — R05 Cough: Secondary | ICD-10-CM

## 2017-05-25 DIAGNOSIS — D696 Thrombocytopenia, unspecified: Secondary | ICD-10-CM

## 2017-05-25 DIAGNOSIS — C8442 Peripheral T-cell lymphoma, not classified, intrathoracic lymph nodes: Secondary | ICD-10-CM

## 2017-05-25 DIAGNOSIS — D6481 Anemia due to antineoplastic chemotherapy: Secondary | ICD-10-CM | POA: Diagnosis not present

## 2017-05-25 DIAGNOSIS — K219 Gastro-esophageal reflux disease without esophagitis: Secondary | ICD-10-CM | POA: Diagnosis not present

## 2017-05-25 DIAGNOSIS — Z8349 Family history of other endocrine, nutritional and metabolic diseases: Secondary | ICD-10-CM | POA: Diagnosis not present

## 2017-05-25 DIAGNOSIS — R000 Tachycardia, unspecified: Secondary | ICD-10-CM | POA: Diagnosis not present

## 2017-05-25 DIAGNOSIS — I429 Cardiomyopathy, unspecified: Secondary | ICD-10-CM | POA: Diagnosis not present

## 2017-05-25 DIAGNOSIS — Z79899 Other long term (current) drug therapy: Secondary | ICD-10-CM | POA: Diagnosis not present

## 2017-05-25 DIAGNOSIS — R059 Cough, unspecified: Secondary | ICD-10-CM

## 2017-05-25 DIAGNOSIS — C8448 Peripheral T-cell lymphoma, not classified, lymph nodes of multiple sites: Secondary | ICD-10-CM | POA: Diagnosis not present

## 2017-05-25 DIAGNOSIS — J209 Acute bronchitis, unspecified: Secondary | ICD-10-CM

## 2017-05-25 DIAGNOSIS — Z833 Family history of diabetes mellitus: Secondary | ICD-10-CM | POA: Insufficient documentation

## 2017-05-25 DIAGNOSIS — D649 Anemia, unspecified: Secondary | ICD-10-CM

## 2017-05-25 DIAGNOSIS — Z825 Family history of asthma and other chronic lower respiratory diseases: Secondary | ICD-10-CM | POA: Diagnosis not present

## 2017-05-25 DIAGNOSIS — E7800 Pure hypercholesterolemia, unspecified: Secondary | ICD-10-CM | POA: Diagnosis not present

## 2017-05-25 DIAGNOSIS — Z5112 Encounter for antineoplastic immunotherapy: Secondary | ICD-10-CM | POA: Diagnosis not present

## 2017-05-25 DIAGNOSIS — R3915 Urgency of urination: Secondary | ICD-10-CM | POA: Diagnosis not present

## 2017-05-25 DIAGNOSIS — Z7951 Long term (current) use of inhaled steroids: Secondary | ICD-10-CM | POA: Insufficient documentation

## 2017-05-25 DIAGNOSIS — Z85820 Personal history of malignant melanoma of skin: Secondary | ICD-10-CM | POA: Diagnosis not present

## 2017-05-25 DIAGNOSIS — J45909 Unspecified asthma, uncomplicated: Secondary | ICD-10-CM | POA: Insufficient documentation

## 2017-05-25 DIAGNOSIS — Z5111 Encounter for antineoplastic chemotherapy: Secondary | ICD-10-CM | POA: Diagnosis not present

## 2017-05-25 LAB — CBC WITH DIFFERENTIAL (CANCER CENTER ONLY)
BASOS ABS: 0.2 10*3/uL — AB (ref 0.0–0.1)
Basophils Relative: 1 %
Eosinophils Absolute: 0 10*3/uL (ref 0.0–0.5)
Eosinophils Relative: 0 %
HEMATOCRIT: 28.4 % — AB (ref 38.4–49.9)
HEMOGLOBIN: 8.8 g/dL — AB (ref 13.0–17.1)
LYMPHS PCT: 6 %
Lymphs Abs: 1 10*3/uL (ref 0.9–3.3)
MCH: 27.2 pg (ref 27.2–33.4)
MCHC: 31 g/dL — ABNORMAL LOW (ref 32.0–36.0)
MCV: 87.9 fL (ref 79.3–98.0)
MONO ABS: 3.5 10*3/uL — AB (ref 0.1–0.9)
MONOS PCT: 20 %
NEUTROS ABS: 12.8 10*3/uL — AB (ref 1.5–6.5)
Neutrophils Relative %: 73 %
Platelet Count: 72 10*3/uL — ABNORMAL LOW (ref 140–400)
RBC: 3.23 MIL/uL — ABNORMAL LOW (ref 4.20–5.82)
RDW: 19.6 % — AB (ref 11.0–15.6)
WBC Count: 17.6 10*3/uL — ABNORMAL HIGH (ref 4.0–10.3)

## 2017-05-25 LAB — RETICULOCYTES
RBC.: 3.23 MIL/uL — ABNORMAL LOW (ref 4.20–5.82)
RETIC COUNT ABSOLUTE: 164.7 10*3/uL — AB (ref 34.8–93.9)
Retic Ct Pct: 5.1 % — ABNORMAL HIGH (ref 0.8–1.8)

## 2017-05-25 MED ORDER — LEVOFLOXACIN 500 MG PO TABS: 500.0000 mg | ORAL_TABLET | Freq: Every day | ORAL | 0 refills | Status: DC

## 2017-05-25 NOTE — Telephone Encounter (Signed)
Received call from pt's wife stating [facility] would not fill rx for levaquin due to interaction with fluconazole.  Pt told pharmacy left a message with [place] (no message received by this RN).  Per [name], pt should hold fluconazole while on levaquin.  Reviewed with pt's wife.  Wife asked this RN to contact [facility] to advise them to fill RX.  Message left on answering service at [facility].  Call back number provided.

## 2017-05-25 NOTE — Progress Notes (Signed)
HEMATOLOGY/ONCOLOGY CLINIC NOTE  Date of Service: 05/25/2017  Patient Care Team: Hulan Fess, MD as PCP - General (Family Medicine)  CHIEF COMPLAINTS/PURPOSE OF CONSULTATION:   F/u for newly diagnosed T cell NHL  CURRENT THERAPY: R-CHOEP s/p 4 cycles   HISTORY OF PRESENTING ILLNESS:  See previous note for details  INTERVAL HISTORY   Joseph Powell is a wonderful 62 y.o. male here for his scheduled followup prior C5D1 of chemotherapy. He presents to the clinic today accompanied by his wife.   The pt received a CXR (05/25/17) that resulted in no acute cardiopulmonary abnormality ruling out pneumonia.  He notes a present productive cough x thick, opaque, yellow sputum and rhinorrhea but denies fevers, chills, chest discomfort and has been taking mucinex to treat this. He was treated with doxycycline for a URI a few months ago . Pt notes he had a remote hx of a rash from taking penicillin as a child, but is unaware of the severity or location of the rash.   Discussed pt labwork today (05/25/17) of CBC, CMP, and Reticulocytes is as follows: all values are WNL except for WBC at 17.6k, RBC at 3.23, Hgb at 8.8, HCT at 28.4, MCHC at 31.0, RDW at 19.6, Platelet Count at 72, and Neutro Abs at 12.8, Monocytes Absolute at 3.5, Basophils Absolute at 0.2, Retic Ct Pct at 5.1%, and Retic Count Absolute at 164.7.   Platelets counts have not dropped significantly and we would want to monitor this again prior to next cycle of treatment.   MEDICAL HISTORY:  Past Medical History:  Diagnosis Date  . Allergic rhinitis   . Asthma    slight  . Bell's palsy   . GERD (gastroesophageal reflux disease)   . Gout   . Hypercholesteremia   . Melanoma ([place])   . Osteoarthritis   . Sleep apnea    Resolving  . Thrombocytopenia ([place])   . Tubular adenoma     SURGICAL HISTORY: Past Surgical History:  Procedure Laterality Date  . IR FLUORO GUIDE PORT INSERTION RIGHT  02/17/2017  . IR US GUIDE VASC  ACCESS RIGHT  02/17/2017  . KNEE SURGERY  1993  . MELANOMA EXCISION    . NASAL RECONSTRUCTION    . VASECTOMY  1999    SOCIAL HISTORY: Social History   Socioeconomic History  . Marital status: Married    Spouse name: Not on file  . Number of children: Not on file  . Years of education: Not on file  . Highest education level: Not on file  Social Needs  . Financial resource strain: Not on file  . Food insecurity - worry: Not on file  . Food insecurity - inability: Not on file  . Transportation needs - medical: Not on file  . Transportation needs - non-medical: Not on file  Occupational History  . Occupation: Risk analyst  Tobacco Use  . Smoking status: Never Smoker  . Smokeless tobacco: Never Used  Substance and Sexual Activity  . Alcohol use: Yes    Alcohol/week: 1.0 oz    Types: 2 Standard drinks or equivalent per week    Comment: socially  . Drug use: No  . Sexual activity: Not on file  Other Topics Concern  . Not on file  Social History Narrative  . Not on file    FAMILY HISTORY: Family History  Problem Relation Age of Onset  . Diabetes Mother   . Asthma Mother   . Gout Brother   .  Colon cancer Paternal Uncle   . Esophageal cancer Neg Hx   . Stomach cancer Neg Hx     ALLERGIES:  is allergic to penicillins.  MEDICATIONS:  Current Outpatient Medications  Medication Sig Dispense Refill  . albuterol (PROVENTIL HFA;VENTOLIN HFA) 108 (90 Base) MCG/ACT inhaler Inhale 1-2 puffs into the lungs every 6 (six) hours as needed for wheezing or shortness of breath.    . chlorpheniramine (CHLOR-TRIMETON) 4 MG tablet Take 4 mg every 4 (four) hours as needed by mouth for allergies.    Marland Kitchen dronabinol (MARINOL) 5 MG capsule Take 1 capsule (5 mg total) by mouth 2 (two) times daily before a meal. 60 capsule 0  . fluconazole (DIFLUCAN) 200 MG tablet Take 1 tablet (200 mg total) by mouth daily. Hold 24h prior to chemotherapy 30 tablet 2  . levofloxacin (LEVAQUIN) 500 MG tablet  Take 1 tablet (500 mg total) by mouth daily. 7 tablet 0  . lidocaine-prilocaine (EMLA) cream Apply to affected area once 30 g 3  . mometasone-formoterol (DULERA) 100-5 MCG/ACT AERO Inhale 2 puffs into the lungs 2 (two) times daily. 1 Inhaler 11  . pantoprazole (PROTONIX) 40 MG tablet Take 40 mg by mouth daily.     . predniSONE (DELTASONE) 20 MG tablet Take 3 tablets (60 mg total) by mouth daily. Take on days 1-5 of chemotherapy. 15 tablet 5  . traZODone (DESYREL) 50 MG tablet Take 1-2 tablets (50-100 mg total) by mouth at bedtime. 60 tablet 2   Current Facility-Administered Medications  Medication Dose Route Frequency Provider Last Rate Last Dose  . 0.9 %  sodium chloride infusion  500 mL Intravenous Continuous Milus Banister, MD        REVIEW OF SYSTEMS:    10 Point review of Systems was done is negative except as noted above.  PHYSICAL EXAMINATION: ECOG PERFORMANCE STATUS: 1 - Symptomatic but completely ambulatory  . Vitals:   05/25/17 1425  BP: 109/81  Pulse: (!) 115  Resp: 18  Temp: 98.1 F (36.7 C)  SpO2: 100%   Filed Weights   05/25/17 1425  Weight: 168 lb 9.6 oz (76.5 kg)   .Body mass index is 23.51 kg/m.  GENERAL:alert, in no acute distress and comfortable SKIN: no acute rashes, no significant lesions EYES: conjunctiva are pink and non-injected, sclera anicteric OROPHARYNX: MMM, post-nasal drip, rhinorrhea no exudates, no oropharyngeal erythema or ulceration NECK: supple, no JVD LYMPH:  (+) 2 palpable lymphadenopathy in the cervical left lower neck and no obvious lymphadenopathy in axillary or inguinal regions that I could appreciate LUNGS: clear to auscultation b/l with normal respiratory effort. No rhonchi or rales noted.  HEART: regular rate & rhythm ABDOMEN:  normoactive bowel sounds , non tender, not distended. Borderline palpable splenomegaly, no overt hepatomegaly.  Extremity: no pedal edema PSYCH: alert & oriented x 3 with fluent speech NEURO: no focal  motor/sensory deficits  LABORATORY DATA:  I have reviewed the data as listed  CBC Latest Ref Rng & Units 05/25/2017 05/11/2017 05/01/2017  WBC 4.0 - 10.3 K/uL 17.6(H) 16.8(H) 16.1(H)  Hemoglobin 13.0 - 17.1 g/dL - - 8.6(L)  Hematocrit 38.4 - 49.9 % 28.4(L) 29.1(L) 27.6(L)  Platelets 140 - 400 K/uL 72(L) 87(L) 30(L)    CMP Latest Ref Rng & Units 05/11/2017 05/01/2017 04/21/2017  Glucose 70 - 140 mg/dL 187(H) 223(H) 214(H)  BUN 7 - 26 mg/dL 8 5.6(L) 9.3  Creatinine 0.7 - 1.3 mg/dL - 0.7 0.7  Sodium 136 - 145 mmol/L 140 137 139  Potassium 3.5 - 5.1 mmol/L 4.3 3.6 4.1  Chloride 98 - 109 mmol/L 104 - -  CO2 22 - 29 mmol/L _0 Calcium 8.4 - 10.4 mg/dL 9.4 8.8 9.6  Total Protein 6.4 - 8.3 g/dL 6.2(L) 6.1(L) 6.3(L)  Total Bilirubin 0.2 - 1.2 mg/dL 0.5 0.40 0.50  Alkaline Phos 40 - 150 U/L 266(H) 256(H) 192(H)  AST 5 - 34 U/L _1 ALT 0 - 55 U/L _2 Uric acid 3.1     RADIOGRAPHIC STUDIES:  I have personally reviewed the radiological images as listed and agreed with the findings in the report. Dg Chest 2 View  Result Date: 05/25/2017 CLINICAL DATA:  62 year old male with lymphoma.  Productive cough. EXAM: CHEST  2 VIEW COMPARISON:  PET-CT 04/30/2017 and earlier. FINDINGS: Right chest porta cath in place. Normal cardiac size and mediastinal contours. Visualized tracheal air column is within normal limits. Lung volumes are within normal limits. No pneumothorax, pulmonary edema, pleural effusion or confluent pulmonary opacity. No acute osseous abnormality identified. Negative visible bowel gas pattern. IMPRESSION: No acute cardiopulmonary abnormality. Electronically Signed   By: Genevie Ann M.D.   On: 05/25/2017 14:33   Nm Pet Image Restag (ps) Skull Base To Thigh  Result Date: 04/30/2017 CLINICAL DATA:  Subsequent treatment strategy for stage IV high-grade peripheral T-cell lymphoma status post chemotherapy. EXAM: NUCLEAR MEDICINE PET SKULL BASE TO THIGH TECHNIQUE: 8.7 mCi F-18  FDG was injected intravenously. Full-ring PET imaging was performed from the skull base to thigh after the radiotracer. CT data was obtained and used for attenuation correction and anatomic localization. Mediastinal Blood Pool Activity (max SUV): 2.0 FASTING BLOOD GLUCOSE:  Value: 165 mg/dl COMPARISON:  01/29/2017 PET-CT. FINDINGS: NECK: No new or residual hypermetabolic lymph nodes in the neck. Previously visualized bilateral hypermetabolic cervical lymph nodes have resolved. CHEST: No new or residual hypermetabolic axillary, mediastinal or hilar lymph nodes. Previously visualized hypermetabolic bilateral mediastinal and bilateral hilar lymph nodes have resolved. New small pericardial effusion with associated mild nonfocal hypermetabolism. No pneumothorax. New trace dependent left pleural effusion. No right pleural effusion. No acute consolidative airspace disease, lung masses or significant pulmonary nodules. New scattered mildly thick parenchymal bands in the lingula, which could represent mild scarring or atelectasis. Right internal jugular MediPort terminates at the cavoatrial junction. Coronary atherosclerosis. Atherosclerotic nonaneurysmal thoracic aorta. ABDOMEN/PELVIS: No new or residual hypermetabolic lymph nodes in the abdomen or pelvis. Previously visualized hypermetabolic retroperitoneal, mesenteric and bilateral pelvic lymph nodes are substantially decreased in size (all subcentimeter in size) and demonstrate no residual hypermetabolism. Spleen is decreased and now normal in size (craniocaudal splenic length 10.3 cm, previously 13.7 cm). Previously visualized discrete hypermetabolic splenic lesions have resolved. No new hypermetabolic splenic lesions. Uniform mild splenic hypermetabolism with max SUV 4.1, decreased from max SUV 5.5 on the prior PET-CT. No abnormal hypermetabolic activity within the liver, pancreas, adrenal glands, or spleen. Nonobstructing 2 mm stones in the mid and lower right kidney.  No hydronephrosis. Atherosclerotic nonaneurysmal abdominal aorta. Stable mildly enlarged prostate with nonspecific internal prostatic calcifications. Vasectomy clips are noted in the upper scrotum bilaterally. SKELETON: There is uniform hypermetabolism throughout the axial and proximal appendicular skeleton with representative max SUV 6.6 in the L5 vertebral body (compared to max SUV 12.5 on prior PET-CT), without focal osseous hypermetabolism. Previously visualized widespread foci of skeletal hypermetabolism in the axial and proximal appendicular skeleton are not discretely apparent on today's scan. IMPRESSION: 1. Probable complete metabolic response. No  residual hypermetabolic adenopathy in the neck, chest, abdomen or pelvis. No residual discrete hypermetabolic splenic or osseous lesions. 2. Spleen is decreased and now normal in size. Uniform mild splenic hypermetabolism is nonspecific and more likely reactive. Uniform hypermetabolism throughout the axial and proximal appendicular skeleton is nonspecific and more likely due to a reactive marrow state from recent chemotherapy. These findings can be reassessed on follow-up PET-CT. 3. New small pericardial effusion with associated mild nonfocal hypermetabolism, cannot exclude infectious or inflammatory pericarditis. 4. New trace dependent left pleural effusion. 5. Chronic findings include: Aortic Atherosclerosis (ICD10-I70.0). Coronary atherosclerosis. Punctate nonobstructing right renal stones. Mild prostatomegaly. These results will be called to the ordering clinician or representative by the Radiologist Assistant, and communication documented in the PACS or zVision Dashboard. Electronically Signed   By: Ilona Sorrel M.D.   On: 04/30/2017 11:23    ASSESSMENT & PLAN:    Joseph Powell is a 62 y.o. 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 Splenomegaly with numerous low-attenuation splenic masses. Several small low-attenuation liver masses. The leading consideration is lymphoma, although metastatic disease with unknown primary in on the differential.  S/p 4 cycles - PET scan was completed on 04/30/17 and showed probable complete metabolic response. However, it also showed he has a new small pericardial effusion. Noted to have slight tachycardia which was confirmed by EKG on 05/01/17.(liekly from Anemia)  Resolution of consitutional symptoms and cervical lymphadenopathy. -Discussed all lab results from today (05/25/17)  Plan: -Pt to RTC in one week on 06/01/17 for this 6th and final cycle of R-CHOEP. -has been scheduled for post C6 PET/CT and BM Bx in preparation for consolidative AutoHSCT at [facility] with Dr Duanne Moron. -would treat (not hold treatment) for platelets >50k. If platelets >75k f/u in 2 weeks with Dr Irene Limbo if platelets <75k pre-ctx would need weekly labs for monitor and transfusion prn.   #3 Acute on Chronic Thrombocytopenia. Patient has some chronic thrombocytopenia 85-125k for a few years. Platelets upon initial visit are down to 36k in the setting of newly diagnosed lymphoma and treatment with R-CHOEP and from hypersplenism. PLt counts were 87K. Pre C5 and have not drop much at nadir @ 72k. Should improve further prior to next treatment on 06/01/2017.  #4 Elevated ALK PO4 - stable - likely from ctx or bone involvement.  # 5 Anemia from NHL+ Chemotherapy improved from 7.9 to 9.3, now stable at 8.8  # 6 Significant Oro-pharyngeal thrush -- causing low grade fever and change in taste -- treated aggressively with Nystatin mouthwashes and PO Fluconazole. Resolved -- continuing fluconazole prophylaxis.  #7 URI/Bronchitis symptoms today. CXR NAI.  Viral respiratory panel unrevealing. Given prescription for levofloxacin to help clear symptoms prior to next treatment cycle Plan - overall appears to be  have tolerated R-CHOEP relatively well.  -previous thrush resolved - continue po fluconazole for prophylaxis. -His counts have mildly improved but overall been stable and will hopefully bounce with his continued treatment. He has been mildly anemic but this has held overall, platelets are slightly lower at 72K today -he has recommended to seek immediate help for any fevers. --Pt to RTC in one week on 06/01/17 for this 6th and final cycle of R-CHOEP. -has been scheduled for post C6 PET/CT and BM Bx in preparation for consolidative AutoHSCT at [facility] with Dr Duanne Moron. -would treat (not hold treatment) for platelets >50k.  If platelets >75k f/u in 2 weeks with Dr Irene Limbo if platelets <75k pre-ctx would need weekly labs for monitor and transfusion prn. -levofloxacin x 7days  #8 History of Melanoma, 2009 -Followed by Dr. Allyson Sabal for continued skin screening  #9 ?Pericardial effusion -ECHO 05/26/2017 - no pericardial effusion Normal EF. Grade 2 Diastolic dysfunction. - per cardiologist - early cardiac muscle effects of ctx but no contraindication to continue rx -started by cardiology on BB  #9 Patient Active Problem List   Diagnosis Date Noted  . Peripheral T cell lymphoma of intrathoracic lymph nodes ([place]) 03/11/2017  . Counseling regarding advanced care planning and goals of care 02/12/2017  . Mature T/NK-cell lymphomas, unspecified, lymph nodes of multiple sites ([place]) 02/10/2017  . Cough variant asthma vs UACS 01/27/2017  . Dyslipidemia 08/05/2016  . Mild asthma 08/05/2016  . DJD (degenerative joint disease) 08/05/2016  . HLD (hyperlipidemia) 07/27/2016  . GERD (gastroesophageal reflux disease) 07/27/2016  . Gout 07/27/2016  . Discomfort in chest 07/27/2016  . OSA (obstructive sleep apnea) 09/19/2013  . Thrombocytopenia ([place]) 04/14/2011  . SCHATZKI'S RING 08/21/2009     All of the patients questions were answered with apparent satisfaction. The patient knows to call the clinic with any  problems, questions or concerns.  I spent 20 minutes counseling the patient face to face. The total time spent in the appointment was 25 minutes and more than 50% was on counseling and direct patient cares.    Sullivan Lone MD [place] AAHIVMS [facility] Hematology/Oncology Physician [facility]  (Office):       [phone] (Work cell):  [phone] (Fax):           [phone]  05/25/2017 4:40 PM  This document serves as a record of services personally performed by Sullivan Lone, MD. It was created on his behalf by Baldwin [place], a trained medical scribe. The creation of this record is based on the scribe's personal observations and the provider's statements to them.   .I have reviewed the above documentation for accuracy and completeness, and I agree with the above. Brunetta Genera MD MS

## 2017-05-25 NOTE — Telephone Encounter (Signed)
Called patient regarding 1/28

## 2017-05-26 ENCOUNTER — Other Ambulatory Visit: Payer: Self-pay

## 2017-05-26 ENCOUNTER — Ambulatory Visit (HOSPITAL_BASED_OUTPATIENT_CLINIC_OR_DEPARTMENT_OTHER)
Admission: RE | Admit: 2017-05-26 | Discharge: 2017-05-26 | Disposition: A | Discharge status: Home / Self Care | Payer: BLUE CROSS/BLUE SHIELD | Source: Ambulatory Visit

## 2017-05-26 ENCOUNTER — Telehealth: Payer: Self-pay | Admitting: Hematology

## 2017-05-26 ENCOUNTER — Ambulatory Visit (HOSPITAL_BASED_OUTPATIENT_CLINIC_OR_DEPARTMENT_OTHER)
Admission: RE | Admit: 2017-05-26 | Discharge: 2017-05-26 | Disposition: A | Discharge status: Home / Self Care | Payer: BLUE CROSS/BLUE SHIELD | Source: Ambulatory Visit | Attending: Cardiology | Admitting: Cardiology

## 2017-05-26 ENCOUNTER — Encounter (HOSPITAL_COMMUNITY): Payer: Self-pay | Admitting: Cardiology

## 2017-05-26 VITALS — BP 133/75 | HR 119 | Wt 169.8 lb

## 2017-05-26 DIAGNOSIS — Z79899 Other long term (current) drug therapy: Secondary | ICD-10-CM | POA: Diagnosis not present

## 2017-05-26 DIAGNOSIS — T451X5A Adverse effect of antineoplastic and immunosuppressive drugs, initial encounter: Secondary | ICD-10-CM | POA: Diagnosis not present

## 2017-05-26 DIAGNOSIS — C8440 Peripheral T-cell lymphoma, not classified, unspecified site: Secondary | ICD-10-CM | POA: Diagnosis not present

## 2017-05-26 DIAGNOSIS — C8442 Peripheral T-cell lymphoma, not classified, intrathoracic lymph nodes: Secondary | ICD-10-CM

## 2017-05-26 DIAGNOSIS — J45909 Unspecified asthma, uncomplicated: Secondary | ICD-10-CM | POA: Diagnosis not present

## 2017-05-26 DIAGNOSIS — I501 Left ventricular failure: Secondary | ICD-10-CM | POA: Diagnosis not present

## 2017-05-26 DIAGNOSIS — I427 Cardiomyopathy due to drug and external agent: Secondary | ICD-10-CM | POA: Diagnosis not present

## 2017-05-26 DIAGNOSIS — R000 Tachycardia, unspecified: Secondary | ICD-10-CM | POA: Diagnosis not present

## 2017-05-26 DIAGNOSIS — Z8349 Family history of other endocrine, nutritional and metabolic diseases: Secondary | ICD-10-CM | POA: Diagnosis not present

## 2017-05-26 DIAGNOSIS — Z833 Family history of diabetes mellitus: Secondary | ICD-10-CM | POA: Diagnosis not present

## 2017-05-26 DIAGNOSIS — E785 Hyperlipidemia, unspecified: Secondary | ICD-10-CM | POA: Diagnosis not present

## 2017-05-26 DIAGNOSIS — Z7951 Long term (current) use of inhaled steroids: Secondary | ICD-10-CM | POA: Diagnosis not present

## 2017-05-26 DIAGNOSIS — I429 Cardiomyopathy, unspecified: Secondary | ICD-10-CM | POA: Diagnosis not present

## 2017-05-26 DIAGNOSIS — Z825 Family history of asthma and other chronic lower respiratory diseases: Secondary | ICD-10-CM | POA: Diagnosis not present

## 2017-05-26 DIAGNOSIS — K219 Gastro-esophageal reflux disease without esophagitis: Secondary | ICD-10-CM | POA: Diagnosis not present

## 2017-05-26 MED ORDER — CARVEDILOL 3.125 MG PO TABS: 3.1250 mg | ORAL_TABLET | Freq: Two times a day (BID) | ORAL | 3 refills | Status: DC

## 2017-05-26 NOTE — Telephone Encounter (Signed)
Per 1/21 no los °

## 2017-05-26 NOTE — Progress Notes (Signed)
Oncology: [name] PCP: Dr. Rex Kras Cardiology: Dr. Aundra Dubin  62 yo with history of stage IV high grade peripheral T cell lymphoma was referred by [name] for cardio-oncology evaluation.  CT in 9/18 showed extensive retroperitoneal, mesenteric, and left pelvic LAN along with splenic and liver masses.  Chemotherapy started in 10/18 with R-CHOEP (cyclophosphamide, doxorubicin, vincristine, prednisone, etoposide).  Echo in 10/18 showed EF 55-60%.  12/18 PET with complete metabolic response but showed a small pericardial effusion. Patient was then sent for evaluation by cardiology.    Today's echo shows normal EF but abnormal global longitudinal strain pattern.  There is no pericardial effusion on today's echo.  Patient has 1 more chemo round to go, will get on Monday.  No prior significant cardiac history.  No chest pain.  Decreased energy level/more fatigue, but no significant exertional dyspnea. No orthopnea/PND.  No syncope or lightheadedness.  No palpitations.  Patient has been in sinus tachycardia, it appears, since starting chemotherapy.    ECG (personally reviewed): sinus tachycardia at 113, nonspecific T wave flattening  Labs (1/19): K 4.3, creatinine 0.72  PMH: 1. Stage IV high grade peripheral T cell lymphoma: CT in 9/18 showed extensive retroperitoneal, mesenteric, and left pelvic LAN along with splenic and liver masses.  Chemotherapy started in 10/18 with R-CHOEP (cyclophosphamide, doxorubicin, vincristine, prednisone, etoposide).   - 12/18 PET with complete metabolic response.  - Plan for stem cell [facility] at [facility].  - Echo (10/18): EF 55-60%.  - Echo (1/19): EF 55-60%, GLS -12.2%, moderate diastolic dysfunction.  2. GERD 3. Hyperlipidemia 4. Asthma  Social History   Socioeconomic History  . Marital status: Married    Spouse name: Not on file  . Number of children: Not on file  . Years of education: Not on file  . Highest education level: Not on file  Social Needs  . Financial  resource strain: Not on file  . Food insecurity - worry: Not on file  . Food insecurity - inability: Not on file  . Transportation needs - medical: Not on file  . Transportation needs - non-medical: Not on file  Occupational History  . Occupation: Risk analyst  Tobacco Use  . Smoking status: Never Smoker  . Smokeless tobacco: Never Used  Substance and Sexual Activity  . Alcohol use: Yes    Alcohol/week: 1.0 oz    Types: 2 Standard drinks or equivalent per week    Comment: socially  . Drug use: No  . Sexual activity: Not on file  Other Topics Concern  . Not on file  Social History Narrative  . Not on file   Family History  Problem Relation Age of Onset  . Diabetes Mother   . Asthma Mother   . Gout Brother   . Colon cancer Paternal Uncle   . Esophageal cancer Neg Hx   . Stomach cancer Neg Hx    ROS: All systems reviewed and negative except as per HPI.  Current Outpatient Medications  Medication Sig Dispense Refill  . albuterol (PROVENTIL HFA;VENTOLIN HFA) 108 (90 Base) MCG/ACT inhaler Inhale 1-2 puffs into the lungs every 6 (six) hours as needed for wheezing or shortness of breath.    . chlorpheniramine (CHLOR-TRIMETON) 4 MG tablet Take 4 mg every 4 (four) hours as needed by mouth for allergies.    Marland Kitchen dronabinol (MARINOL) 5 MG capsule Take 1 capsule (5 mg total) by mouth 2 (two) times daily before a meal. 60 capsule 0  . fluconazole (DIFLUCAN) 200 MG tablet Take  1 tablet (200 mg total) by mouth daily. Hold 24h prior to chemotherapy 30 tablet 2  . levofloxacin (LEVAQUIN) 500 MG tablet Take 1 tablet (500 mg total) by mouth daily. 7 tablet 0  . lidocaine-prilocaine (EMLA) cream Apply to affected area once 30 g 3  . mometasone-formoterol (DULERA) 100-5 MCG/ACT AERO Inhale 2 puffs into the lungs 2 (two) times daily. 1 Inhaler 11  . pantoprazole (PROTONIX) 40 MG tablet Take 40 mg by mouth daily.     . pravastatin (PRAVACHOL) 40 MG tablet Take 40 mg by mouth daily.    . predniSONE  (DELTASONE) 20 MG tablet Take 3 tablets (60 mg total) by mouth daily. Take on days 1-5 of chemotherapy. 15 tablet 5  . traZODone (DESYREL) 50 MG tablet Take 1-2 tablets (50-100 mg total) by mouth at bedtime. 60 tablet 2  . carvedilol (COREG) 3.125 MG tablet Take 1 tablet (3.125 mg total) by mouth 2 (two) times daily with a meal. 60 tablet 3   Current Facility-Administered Medications  Medication Dose Route Frequency Provider Last Rate Last Dose  . 0.9 %  sodium chloride infusion  500 mL Intravenous Continuous Milus Banister, MD       BP 133/75   Pulse (!) 119   Wt 169 lb 12 oz (77 kg)   SpO2 99%   BMI 23.68 kg/m  General: NAD Neck: No JVD, no thyromegaly or thyroid nodule.  Lungs: Clear to auscultation bilaterally with normal respiratory effort. CV: Nondisplaced PMI.  Heart mildly tachy, regular S1/S2, no S3/S4, no murmur.  No peripheral edema.  No carotid bruit.  Normal pedal pulses.  Abdomen: Soft, nontender, no hepatosplenomegaly, no distention.  Skin: Intact without lesions or rashes.  Neurologic: Alert and oriented x 3.  Psych: Normal affect. Extremities: No clubbing or cyanosis.  HEENT: Normal.   Assessment/Plan: 1. Cardiomyopathy: Patient has been getting a chemo regimen containing doxorubicin.  He has 1 more round of chemotherapy to go.  Echo today was reviewed. EF remains in normal range, but global longitudinal strain is abnormal (-12.2%). This could suggest early toxicity from doxorubicin.  - She only has 1 chemo round to go, so would not hold.   - Start Coreg 3.125 mg bid.  - Repeat echo in 3 months then again in 9 months to assess for late effects of doxorubicin.   2. Sinus tachycardia: Has been present since chemo begun. No treatment at this time.  3. Pericardial effusion: Noted on PET, not noted on echo today.  Possible prior viral infection with associated pericarditis though patient denies chest pain.   Loralie Champagne 05/26/2017

## 2017-05-26 NOTE — Patient Instructions (Addendum)
Start Carvedilol 3.125 mg (1 tab), twice a day  Your physician has requested that you have an echocardiogram. Echocardiography is a painless test that uses sound waves to create images of your heart. It provides your doctor with information about the size and shape of your heart and how well your heart's chambers and valves are working. This procedure takes approximately one hour. There are no restrictions for this procedure.  Your physician recommends that you schedule a follow-up appointment in: 3 months with Dr. Aundra Dubin an a echocardiogram

## 2017-05-26 NOTE — Progress Notes (Signed)
2D Echocardiogram has been performed.  Joseph Powell 05/26/2017, 8:45 AM

## 2017-05-28 LAB — RESPIRATORY VIRUS PANEL
ADENOVIRUS: NEGATIVE
INFLUENZA B 1: NEGATIVE
Influenza A: NEGATIVE
METAPNEUMOVIRUS: NEGATIVE
PARAINFLUENZA 1 A: NEGATIVE
Parainfluenza 2: NEGATIVE
Parainfluenza 3: NEGATIVE
RESPIRATORY SYNCYTIAL VIRUS B: NEGATIVE
Respiratory Syncytial Virus A: NEGATIVE
Rhinovirus: NEGATIVE

## 2017-05-28 MED ORDER — DRONABINOL 5 MG PO CAPS: 5.0000 mg | ORAL_CAPSULE | Freq: Two times a day (BID) | ORAL | 0 refills | Status: DC

## 2017-06-01 ENCOUNTER — Other Ambulatory Visit: Payer: Self-pay

## 2017-06-01 ENCOUNTER — Other Ambulatory Visit: Payer: BLUE CROSS/BLUE SHIELD

## 2017-06-01 ENCOUNTER — Ambulatory Visit: Payer: BLUE CROSS/BLUE SHIELD | Admitting: Hematology

## 2017-06-01 ENCOUNTER — Inpatient Hospital Stay: Payer: BLUE CROSS/BLUE SHIELD

## 2017-06-01 ENCOUNTER — Encounter: Payer: Self-pay | Admitting: Nurse Practitioner

## 2017-06-01 ENCOUNTER — Telehealth: Payer: Self-pay | Admitting: Hematology

## 2017-06-01 ENCOUNTER — Inpatient Hospital Stay: Payer: BLUE CROSS/BLUE SHIELD | Admitting: Nurse Practitioner

## 2017-06-01 VITALS — BP 132/82 | HR 93 | Temp 97.9°F | Resp 18 | Ht 71.0 in | Wt 174.7 lb

## 2017-06-01 VITALS — BP 138/85 | HR 81 | Temp 97.8°F | Resp 16

## 2017-06-01 DIAGNOSIS — R900 Intracranial space-occupying lesion found on diagnostic imaging of central nervous system: Secondary | ICD-10-CM | POA: Diagnosis not present

## 2017-06-01 DIAGNOSIS — C8498 Mature T/NK-cell lymphomas, unspecified, lymph nodes of multiple sites: Secondary | ICD-10-CM

## 2017-06-01 DIAGNOSIS — D696 Thrombocytopenia, unspecified: Secondary | ICD-10-CM

## 2017-06-01 DIAGNOSIS — J209 Acute bronchitis, unspecified: Secondary | ICD-10-CM | POA: Diagnosis not present

## 2017-06-01 DIAGNOSIS — I313 Pericardial effusion (noninflammatory): Secondary | ICD-10-CM

## 2017-06-01 DIAGNOSIS — E7800 Pure hypercholesterolemia, unspecified: Secondary | ICD-10-CM | POA: Diagnosis not present

## 2017-06-01 DIAGNOSIS — Z85820 Personal history of malignant melanoma of skin: Secondary | ICD-10-CM

## 2017-06-01 DIAGNOSIS — R000 Tachycardia, unspecified: Secondary | ICD-10-CM | POA: Diagnosis not present

## 2017-06-01 DIAGNOSIS — Z7189 Other specified counseling: Secondary | ICD-10-CM

## 2017-06-01 DIAGNOSIS — D6481 Anemia due to antineoplastic chemotherapy: Secondary | ICD-10-CM

## 2017-06-01 DIAGNOSIS — C8442 Peripheral T-cell lymphoma, not classified, intrathoracic lymph nodes: Secondary | ICD-10-CM

## 2017-06-01 DIAGNOSIS — Z5111 Encounter for antineoplastic chemotherapy: Secondary | ICD-10-CM | POA: Diagnosis not present

## 2017-06-01 DIAGNOSIS — C8448 Peripheral T-cell lymphoma, not classified, lymph nodes of multiple sites: Secondary | ICD-10-CM | POA: Diagnosis not present

## 2017-06-01 DIAGNOSIS — Z5112 Encounter for antineoplastic immunotherapy: Secondary | ICD-10-CM | POA: Diagnosis not present

## 2017-06-01 DIAGNOSIS — R3915 Urgency of urination: Secondary | ICD-10-CM | POA: Diagnosis not present

## 2017-06-01 LAB — COMPREHENSIVE METABOLIC PANEL
ALK PHOS: 201 U/L — AB (ref 40–150)
ALT: 13 U/L (ref 0–55)
AST: 18 U/L (ref 5–34)
Albumin: 3.4 g/dL — ABNORMAL LOW (ref 3.5–5.0)
Anion gap: 8 (ref 3–11)
BUN: 7 mg/dL (ref 7–26)
CALCIUM: 9 mg/dL (ref 8.4–10.4)
CO2: 27 mmol/L (ref 22–29)
CREATININE: 0.71 mg/dL (ref 0.70–1.30)
Chloride: 106 mmol/L (ref 98–109)
GFR calc Af Amer: >60 mL/min (ref >60)
GFR calc non Af Amer: >60 mL/min (ref >60)
GLUCOSE: 137 mg/dL (ref 70–140)
Potassium: 4.1 mmol/L (ref 3.5–5.1)
SODIUM: 141 mmol/L (ref 136–145)
Total Bilirubin: 0.4 mg/dL (ref 0.2–1.2)
Total Protein: 5.8 g/dL — ABNORMAL LOW (ref 6.4–8.3)

## 2017-06-01 LAB — CBC WITH DIFFERENTIAL/PLATELET
BASOS PCT: 0 %
Basophils Absolute: 0 10*3/uL (ref 0.0–0.1)
EOS ABS: 0 10*3/uL (ref 0.0–0.5)
EOS PCT: 0 %
HCT: 27.7 % — ABNORMAL LOW (ref 38.4–49.9)
Hemoglobin: 8.7 g/dL — ABNORMAL LOW (ref 13.0–17.1)
Lymphocytes Relative: 4 %
Lymphs Abs: 0.6 10*3/uL — ABNORMAL LOW (ref 0.9–3.3)
MCH: 27.2 pg (ref 27.2–33.4)
MCHC: 31.6 g/dL — AB (ref 32.0–36.0)
MCV: 86.2 fL (ref 79.3–98.0)
MONO ABS: 1.7 10*3/uL — AB (ref 0.1–0.9)
MONOS PCT: 11 %
Neutro Abs: 13.3 10*3/uL — ABNORMAL HIGH (ref 1.5–6.5)
Neutrophils Relative %: 85 %
PLATELETS: 64 10*3/uL — AB (ref 140–400)
RBC: 3.21 MIL/uL — ABNORMAL LOW (ref 4.20–5.82)
RDW: 21.2 % — AB (ref 11.0–15.6)
WBC: 15.6 10*3/uL — ABNORMAL HIGH (ref 4.0–10.3)

## 2017-06-01 LAB — RETICULOCYTES
RBC.: 3.2 MIL/uL — ABNORMAL LOW (ref 4.20–5.82)
RETIC CT PCT: 3.5 % — AB (ref 0.8–1.8)
Retic Count, Absolute: 112 10*3/uL — ABNORMAL HIGH (ref 34.8–93.9)

## 2017-06-01 MED ORDER — CYCLOPHOSPHAMIDE CHEMO INJECTION 1 GM
750.0000 mg/m2 | Freq: Once | INTRAMUSCULAR | Status: AC
  Administered 2017-06-01: 1540 mg via INTRAVENOUS
  Filled 2017-06-01: qty 77

## 2017-06-01 MED ORDER — PALONOSETRON HCL INJECTION 0.25 MG/5ML
0.2500 mg | Freq: Once | INTRAVENOUS | Status: AC
  Administered 2017-06-01: 0.25 mg via INTRAVENOUS

## 2017-06-01 MED ORDER — SODIUM CHLORIDE 0.9 % IV SOLN
Freq: Once | INTRAVENOUS | Status: AC
  Administered 2017-06-01: 12:00:00 via INTRAVENOUS
  Filled 2017-06-01: qty 5

## 2017-06-01 MED ORDER — PALONOSETRON HCL INJECTION 0.25 MG/5ML
INTRAVENOUS | Status: AC
  Filled 2017-06-01: qty 5

## 2017-06-01 MED ORDER — LIDOCAINE-PRILOCAINE 2.5-2.5 % EX CREA: TOPICAL_CREAM | CUTANEOUS | 3 refills | Status: DC

## 2017-06-01 MED ORDER — SODIUM CHLORIDE 0.9 % IV SOLN
375.0000 mg/m2 | Freq: Once | INTRAVENOUS | Status: AC
  Administered 2017-06-01: 700 mg via INTRAVENOUS
  Filled 2017-06-01: qty 50

## 2017-06-01 MED ORDER — DOXORUBICIN HCL CHEMO IV INJECTION 2 MG/ML
50.0000 mg/m2 | Freq: Once | INTRAVENOUS | Status: AC
  Administered 2017-06-01: 102 mg via INTRAVENOUS
  Filled 2017-06-01: qty 51

## 2017-06-01 MED ORDER — DIPHENHYDRAMINE HCL 25 MG PO CAPS
50.0000 mg | ORAL_CAPSULE | Freq: Once | ORAL | Status: AC
  Administered 2017-06-01: 50 mg via ORAL

## 2017-06-01 MED ORDER — ACETAMINOPHEN 325 MG PO TABS
650.0000 mg | ORAL_TABLET | Freq: Once | ORAL | Status: AC
  Administered 2017-06-01: 650 mg via ORAL

## 2017-06-01 MED ORDER — SODIUM CHLORIDE 0.9% FLUSH
10.0000 mL | INTRAVENOUS | Status: DC | PRN
  Administered 2017-06-01: 10 mL
  Filled 2017-06-01: qty 10

## 2017-06-01 MED ORDER — ACETAMINOPHEN 325 MG PO TABS
ORAL_TABLET | ORAL | Status: AC
  Filled 2017-06-01: qty 2

## 2017-06-01 MED ORDER — SODIUM CHLORIDE 0.9 % IV SOLN
200.0000 mg | Freq: Once | INTRAVENOUS | Status: AC
  Administered 2017-06-01: 200 mg via INTRAVENOUS
  Filled 2017-06-01: qty 10

## 2017-06-01 MED ORDER — HEPARIN SOD (PORK) LOCK FLUSH 100 UNIT/ML IV SOLN
500.0000 [IU] | Freq: Once | INTRAVENOUS | Status: AC | PRN
  Administered 2017-06-01: 500 [IU]
  Filled 2017-06-01: qty 5

## 2017-06-01 MED ORDER — SODIUM CHLORIDE 0.9 % IV SOLN
2.0000 mg | Freq: Once | INTRAVENOUS | Status: AC
  Administered 2017-06-01: 2 mg via INTRAVENOUS
  Filled 2017-06-01: qty 2

## 2017-06-01 MED ORDER — DIPHENHYDRAMINE HCL 25 MG PO CAPS
ORAL_CAPSULE | ORAL | Status: AC
  Filled 2017-06-01: qty 2

## 2017-06-01 MED ORDER — SODIUM CHLORIDE 0.9 % IV SOLN
Freq: Once | INTRAVENOUS | Status: AC
  Administered 2017-06-01: 11:00:00 via INTRAVENOUS

## 2017-06-01 NOTE — Patient Instructions (Signed)
[facility] Discharge Instructions for Patients Receiving Chemotherapy  Today you received the following chemotherapy agents Adriamycin, Vincristine, Cytoxan, Etoposide, Rituxan  To help prevent nausea and vomiting after your treatment, we encourage you to take your nausea medication as directed   If you develop nausea and vomiting that is not controlled by your nausea medication, call the clinic.   BELOW ARE SYMPTOMS THAT SHOULD BE REPORTED IMMEDIATELY:  *FEVER GREATER THAN 100.5 F  *CHILLS WITH OR WITHOUT FEVER  NAUSEA AND VOMITING THAT IS NOT CONTROLLED WITH YOUR NAUSEA MEDICATION  *UNUSUAL SHORTNESS OF BREATH  *UNUSUAL BRUISING OR BLEEDING  TENDERNESS IN MOUTH AND THROAT WITH OR WITHOUT PRESENCE OF ULCERS  *URINARY PROBLEMS  *BOWEL PROBLEMS  UNUSUAL RASH Items with * indicate a potential emergency and should be followed up as soon as possible.  Feel free to call the clinic should you have any questions or concerns. The clinic phone number is [phone].  Please show the [place] at check-in to the Emergency Department and triage nurse.

## 2017-06-01 NOTE — Telephone Encounter (Signed)
Gave avs and calendar for February  °

## 2017-06-01 NOTE — Progress Notes (Signed)
  [facility] OFFICE PROGRESS NOTE   Diagnosis: T-cell lymphoma  INTERVAL HISTORY:   Mr. Eschmann returns as scheduled.  He completed cycle 5 R-CHOEP beginning 05/11/2017.  He denies nausea/vomiting.  He had a recent single mouth sore which has resolved.  No issues with constipation or diarrhea.  No numbness or tingling in his hands or feet.  He continues on Marinol for appetite. Objective:  Vital signs in last 24 hours:  Blood pressure 132/82, pulse 93, temperature 97.9 F (36.6 C), temperature source Oral, resp. rate 18, height 5\' 11"  (1.803 m), weight 174 lb 11.2 oz (79.2 kg), SpO2 100 %.    HEENT: No thrush or ulcers. Lymphatics: No palpable cervical or supraclavicular lymph nodes. Resp: Lungs clear bilaterally. Cardio: Regular rate and rhythm. GI: Abdomen soft and nontender.  No hepatosplenomegaly. Vascular: No leg edema. Neuro: Vibratory sense intact over the fingertips for tuning fork exam. Port-A-Cath without erythema.  Lab Results:  Lab Results  Component Value Date   WBC 15.6 (H) 06/01/2017   HGB 8.7 (L) 06/01/2017   HCT 27.7 (L) 06/01/2017   MCV 86.2 06/01/2017   PLT 64 (L) 06/01/2017   NEUTROABS 13.3 (H) 06/01/2017    Imaging:  No results found.  Medications: I have reviewed the patient's current medications.  Assessment/Plan: 1. 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  Restaging PET scan after 4 cycles showed probable complete metabolic response. 2. Acute on chronic thrombocytopenia. 3. History of elevated alkaline phosphatase. 4. Anemia secondary to lymphoma and chemotherapy. 5. Question pericardial effusion.  Echo 05/26/2017-no pericardial effusion; normal EF; grade 2 diastolic dysfunction.  Followed by cardiology.    Disposition: Mr. Messina appears stable.  He has completed 5 cycles of R-CHOEP.  Plan to proceed with cycle 6 today as scheduled.    We reviewed today's labs.   He has thrombocytopenia with a platelet count of 64,000.  Per Dr. Grier Mitts note dated 05/25/2017 the plan is to proceed with treatment scheduled if platelets are greater than 50,000.  He will return for a CBC in 1 week, CBC and follow-up visit in 2 weeks.  Bleeding precautions were reviewed.  He understands to contact the office with bruising/bleeding.    Ned Card ANP/GNP-BC   06/01/2017  10:46 AM

## 2017-06-02 ENCOUNTER — Other Ambulatory Visit: Payer: BLUE CROSS/BLUE SHIELD

## 2017-06-02 ENCOUNTER — Inpatient Hospital Stay: Payer: BLUE CROSS/BLUE SHIELD

## 2017-06-02 VITALS — BP 126/89 | HR 81 | Temp 97.6°F | Resp 17

## 2017-06-02 DIAGNOSIS — Z85820 Personal history of malignant melanoma of skin: Secondary | ICD-10-CM | POA: Diagnosis not present

## 2017-06-02 DIAGNOSIS — E7800 Pure hypercholesterolemia, unspecified: Secondary | ICD-10-CM | POA: Diagnosis not present

## 2017-06-02 DIAGNOSIS — Z5111 Encounter for antineoplastic chemotherapy: Secondary | ICD-10-CM | POA: Diagnosis not present

## 2017-06-02 DIAGNOSIS — D696 Thrombocytopenia, unspecified: Secondary | ICD-10-CM | POA: Diagnosis not present

## 2017-06-02 DIAGNOSIS — J209 Acute bronchitis, unspecified: Secondary | ICD-10-CM | POA: Diagnosis not present

## 2017-06-02 DIAGNOSIS — C8498 Mature T/NK-cell lymphomas, unspecified, lymph nodes of multiple sites: Secondary | ICD-10-CM

## 2017-06-02 DIAGNOSIS — R000 Tachycardia, unspecified: Secondary | ICD-10-CM | POA: Diagnosis not present

## 2017-06-02 DIAGNOSIS — C8448 Peripheral T-cell lymphoma, not classified, lymph nodes of multiple sites: Secondary | ICD-10-CM | POA: Diagnosis not present

## 2017-06-02 DIAGNOSIS — D6481 Anemia due to antineoplastic chemotherapy: Secondary | ICD-10-CM | POA: Diagnosis not present

## 2017-06-02 DIAGNOSIS — Z95828 Presence of other vascular implants and grafts: Secondary | ICD-10-CM

## 2017-06-02 DIAGNOSIS — R3915 Urgency of urination: Secondary | ICD-10-CM | POA: Diagnosis not present

## 2017-06-02 DIAGNOSIS — Z5112 Encounter for antineoplastic immunotherapy: Secondary | ICD-10-CM | POA: Diagnosis not present

## 2017-06-02 DIAGNOSIS — Z7189 Other specified counseling: Secondary | ICD-10-CM

## 2017-06-02 DIAGNOSIS — I313 Pericardial effusion (noninflammatory): Secondary | ICD-10-CM | POA: Diagnosis not present

## 2017-06-02 MED ORDER — PROCHLORPERAZINE EDISYLATE 5 MG/ML IJ SOLN
10.0000 mg | Freq: Once | INTRAMUSCULAR | Status: AC
  Administered 2017-06-02: 10 mg via INTRAVENOUS

## 2017-06-02 MED ORDER — SODIUM CHLORIDE 0.9 % IV SOLN
Freq: Once | INTRAVENOUS | Status: AC
  Administered 2017-06-02: 11:00:00 via INTRAVENOUS

## 2017-06-02 MED ORDER — ETOPOSIDE CHEMO INJECTION 1 GM/50ML
200.0000 mg | Freq: Once | INTRAVENOUS | Status: AC
  Administered 2017-06-02: 200 mg via INTRAVENOUS
  Filled 2017-06-02: qty 10

## 2017-06-02 MED ORDER — SODIUM CHLORIDE 0.9% FLUSH
10.0000 mL | INTRAVENOUS | Status: DC | PRN
  Administered 2017-06-02: 10 mL
  Filled 2017-06-02: qty 10

## 2017-06-02 MED ORDER — HEPARIN SOD (PORK) LOCK FLUSH 100 UNIT/ML IV SOLN
500.0000 [IU] | Freq: Once | INTRAVENOUS | Status: AC | PRN
  Administered 2017-06-02: 500 [IU]
  Filled 2017-06-02: qty 5

## 2017-06-02 MED ORDER — PROCHLORPERAZINE EDISYLATE 5 MG/ML IJ SOLN
INTRAMUSCULAR | Status: AC
  Filled 2017-06-02: qty 2

## 2017-06-02 NOTE — Patient Instructions (Signed)
[facility] Discharge Instructions for Patients Receiving Chemotherapy  Today you received the following chemotherapy agents:  Etoposide.  To help prevent nausea and vomiting after your treatment, we encourage you to take your nausea medication as directed.   If you develop nausea and vomiting that is not controlled by your nausea medication, call the clinic.   BELOW ARE SYMPTOMS THAT SHOULD BE REPORTED IMMEDIATELY:  *FEVER GREATER THAN 100.5 F  *CHILLS WITH OR WITHOUT FEVER  NAUSEA AND VOMITING THAT IS NOT CONTROLLED WITH YOUR NAUSEA MEDICATION  *UNUSUAL SHORTNESS OF BREATH  *UNUSUAL BRUISING OR BLEEDING  TENDERNESS IN MOUTH AND THROAT WITH OR WITHOUT PRESENCE OF ULCERS  *URINARY PROBLEMS  *BOWEL PROBLEMS  UNUSUAL RASH Items with * indicate a potential emergency and should be followed up as soon as possible.  Feel free to call the clinic should you have any questions or concerns. The clinic phone number is [phone].  Please show the CHEMO ALERT CARD at check-in to the Emergency Department and triage nurse.   

## 2017-06-03 ENCOUNTER — Inpatient Hospital Stay: Payer: BLUE CROSS/BLUE SHIELD

## 2017-06-03 ENCOUNTER — Ambulatory Visit: Payer: BLUE CROSS/BLUE SHIELD

## 2017-06-03 VITALS — BP 123/75 | HR 83 | Temp 97.9°F | Resp 18

## 2017-06-03 DIAGNOSIS — R3915 Urgency of urination: Secondary | ICD-10-CM | POA: Diagnosis not present

## 2017-06-03 DIAGNOSIS — D6481 Anemia due to antineoplastic chemotherapy: Secondary | ICD-10-CM | POA: Diagnosis not present

## 2017-06-03 DIAGNOSIS — R000 Tachycardia, unspecified: Secondary | ICD-10-CM | POA: Diagnosis not present

## 2017-06-03 DIAGNOSIS — C8448 Peripheral T-cell lymphoma, not classified, lymph nodes of multiple sites: Secondary | ICD-10-CM | POA: Diagnosis not present

## 2017-06-03 DIAGNOSIS — I313 Pericardial effusion (noninflammatory): Secondary | ICD-10-CM | POA: Diagnosis not present

## 2017-06-03 DIAGNOSIS — D696 Thrombocytopenia, unspecified: Secondary | ICD-10-CM | POA: Diagnosis not present

## 2017-06-03 DIAGNOSIS — Z5112 Encounter for antineoplastic immunotherapy: Secondary | ICD-10-CM | POA: Diagnosis not present

## 2017-06-03 DIAGNOSIS — Z85820 Personal history of malignant melanoma of skin: Secondary | ICD-10-CM | POA: Diagnosis not present

## 2017-06-03 DIAGNOSIS — J209 Acute bronchitis, unspecified: Secondary | ICD-10-CM | POA: Diagnosis not present

## 2017-06-03 DIAGNOSIS — Z5111 Encounter for antineoplastic chemotherapy: Secondary | ICD-10-CM | POA: Diagnosis not present

## 2017-06-03 DIAGNOSIS — C8498 Mature T/NK-cell lymphomas, unspecified, lymph nodes of multiple sites: Secondary | ICD-10-CM

## 2017-06-03 DIAGNOSIS — Z7189 Other specified counseling: Secondary | ICD-10-CM

## 2017-06-03 DIAGNOSIS — E7800 Pure hypercholesterolemia, unspecified: Secondary | ICD-10-CM | POA: Diagnosis not present

## 2017-06-03 MED ORDER — PEGFILGRASTIM 6 MG/0.6ML ~~LOC~~ PSKT
6.0000 mg | PREFILLED_SYRINGE | Freq: Once | SUBCUTANEOUS | Status: AC
  Administered 2017-06-03: 6 mg via SUBCUTANEOUS

## 2017-06-03 MED ORDER — HEPARIN SOD (PORK) LOCK FLUSH 100 UNIT/ML IV SOLN
500.0000 [IU] | Freq: Once | INTRAVENOUS | Status: AC | PRN
  Administered 2017-06-03: 500 [IU]
  Filled 2017-06-03: qty 5

## 2017-06-03 MED ORDER — PROCHLORPERAZINE EDISYLATE 5 MG/ML IJ SOLN
INTRAMUSCULAR | Status: AC
  Filled 2017-06-03: qty 2

## 2017-06-03 MED ORDER — SODIUM CHLORIDE 0.9% FLUSH
10.0000 mL | INTRAVENOUS | Status: DC | PRN
  Administered 2017-06-03: 10 mL
  Filled 2017-06-03: qty 10

## 2017-06-03 MED ORDER — PROCHLORPERAZINE MALEATE 10 MG PO TABS
ORAL_TABLET | ORAL | Status: AC
  Filled 2017-06-03: qty 1

## 2017-06-03 MED ORDER — PROCHLORPERAZINE EDISYLATE 5 MG/ML IJ SOLN
10.0000 mg | Freq: Once | INTRAMUSCULAR | Status: AC
  Administered 2017-06-03: 10 mg via INTRAVENOUS

## 2017-06-03 MED ORDER — ETOPOSIDE CHEMO INJECTION 1 GM/50ML
200.0000 mg | Freq: Once | INTRAVENOUS | Status: AC
  Administered 2017-06-03: 200 mg via INTRAVENOUS
  Filled 2017-06-03: qty 10

## 2017-06-03 MED ORDER — PEGFILGRASTIM 6 MG/0.6ML ~~LOC~~ PSKT
PREFILLED_SYRINGE | SUBCUTANEOUS | Status: AC
  Filled 2017-06-03: qty 0.6

## 2017-06-03 NOTE — Patient Instructions (Signed)
[facility] Discharge Instructions for Patients Receiving Chemotherapy  Today you received the following chemotherapy agents:  Etoposide.  To help prevent nausea and vomiting after your treatment, we encourage you to take your nausea medication as directed.   If you develop nausea and vomiting that is not controlled by your nausea medication, call the clinic.   BELOW ARE SYMPTOMS THAT SHOULD BE REPORTED IMMEDIATELY:  *FEVER GREATER THAN 100.5 F  *CHILLS WITH OR WITHOUT FEVER  NAUSEA AND VOMITING THAT IS NOT CONTROLLED WITH YOUR NAUSEA MEDICATION  *UNUSUAL SHORTNESS OF BREATH  *UNUSUAL BRUISING OR BLEEDING  TENDERNESS IN MOUTH AND THROAT WITH OR WITHOUT PRESENCE OF ULCERS  *URINARY PROBLEMS  *BOWEL PROBLEMS  UNUSUAL RASH Items with * indicate a potential emergency and should be followed up as soon as possible.  Feel free to call the clinic should you have any questions or concerns. The clinic phone number is [phone].  Please show the CHEMO ALERT CARD at check-in to the Emergency Department and triage nurse.   

## 2017-06-04 ENCOUNTER — Other Ambulatory Visit (HOSPITAL_COMMUNITY): Payer: BLUE CROSS/BLUE SHIELD

## 2017-06-08 ENCOUNTER — Inpatient Hospital Stay: Payer: BLUE CROSS/BLUE SHIELD

## 2017-06-08 ENCOUNTER — Other Ambulatory Visit: Payer: Self-pay

## 2017-06-08 ENCOUNTER — Encounter (HOSPITAL_COMMUNITY): Payer: Self-pay

## 2017-06-08 ENCOUNTER — Inpatient Hospital Stay: Payer: BLUE CROSS/BLUE SHIELD | Attending: Hematology

## 2017-06-08 ENCOUNTER — Telehealth: Payer: Self-pay

## 2017-06-08 ENCOUNTER — Other Ambulatory Visit: Payer: Self-pay | Admitting: Hematology

## 2017-06-08 ENCOUNTER — Encounter (HOSPITAL_COMMUNITY): Payer: BLUE CROSS/BLUE SHIELD

## 2017-06-08 DIAGNOSIS — D696 Thrombocytopenia, unspecified: Secondary | ICD-10-CM

## 2017-06-08 DIAGNOSIS — R000 Tachycardia, unspecified: Secondary | ICD-10-CM | POA: Diagnosis not present

## 2017-06-08 DIAGNOSIS — C8448 Peripheral T-cell lymphoma, not classified, lymph nodes of multiple sites: Secondary | ICD-10-CM | POA: Diagnosis not present

## 2017-06-08 DIAGNOSIS — D6481 Anemia due to antineoplastic chemotherapy: Secondary | ICD-10-CM | POA: Insufficient documentation

## 2017-06-08 DIAGNOSIS — D649 Anemia, unspecified: Secondary | ICD-10-CM | POA: Insufficient documentation

## 2017-06-08 DIAGNOSIS — D709 Neutropenia, unspecified: Secondary | ICD-10-CM | POA: Diagnosis not present

## 2017-06-08 DIAGNOSIS — Z85820 Personal history of malignant melanoma of skin: Secondary | ICD-10-CM | POA: Insufficient documentation

## 2017-06-08 DIAGNOSIS — I313 Pericardial effusion (noninflammatory): Secondary | ICD-10-CM | POA: Diagnosis not present

## 2017-06-08 DIAGNOSIS — D630 Anemia in neoplastic disease: Secondary | ICD-10-CM | POA: Diagnosis not present

## 2017-06-08 DIAGNOSIS — C8442 Peripheral T-cell lymphoma, not classified, intrathoracic lymph nodes: Secondary | ICD-10-CM

## 2017-06-08 LAB — CBC WITH DIFFERENTIAL (CANCER CENTER ONLY)
BASOS ABS: 0 10*3/uL (ref 0.0–0.1)
BASOS PCT: 0 %
Basophils Absolute: 0 10*3/uL (ref 0.0–0.1)
Basophils Relative: 0 %
EOS PCT: 0 %
Eosinophils Absolute: 0 10*3/uL (ref 0.0–0.5)
Eosinophils Absolute: 0 10*3/uL (ref 0.0–0.7)
Eosinophils Relative: 0 %
HCT: 23.8 % — ABNORMAL LOW (ref 38.4–49.9)
HEMATOCRIT: 20.6 % — AB (ref 39.0–52.0)
Hemoglobin: 7.4 g/dL — ABNORMAL LOW (ref 13.0–17.1)
Hemoglobin: 6.6 g/dL — CL (ref 13.0–17.1)
LYMPHS ABS: 0.2 10*3/uL — AB (ref 0.9–3.3)
LYMPHS PCT: 8 %
LYMPHS PCT: 14 %
Lymphs Abs: 0.3 10*3/uL — ABNORMAL LOW (ref 0.7–4.0)
MCH: 27.5 pg (ref 27.2–33.4)
MCH: 28.3 pg (ref 26.0–34.0)
MCHC: 31.1 g/dL — AB (ref 32.0–36.0)
MCHC: 32 g/dL (ref 30.0–36.0)
MCV: 88.5 fL (ref 79.3–98.0)
MCV: 88.4 fL (ref 78.0–100.0)
MONO ABS: 0.2 10*3/uL (ref 0.1–0.9)
MONOS PCT: 6 %
Monocytes Absolute: 0.1 10*3/uL (ref 0.1–1.0)
Monocytes Relative: 7 %
NEUTROS ABS: 1.6 10*3/uL — AB (ref 1.7–7.7)
Neutro Abs: 2.3 10*3/uL (ref 1.5–6.5)
Neutrophils Relative %: 86 %
Neutrophils Relative %: 79 %
PLATELETS: 35 10*3/uL — AB (ref 140–400)
Platelet Count: 9 10*3/uL — CL (ref 140–400)
RBC: 2.69 MIL/uL — AB (ref 4.20–5.82)
RBC: 2.33 MIL/uL — AB (ref 4.22–5.81)
RDW: 18.7 % — AB (ref 11.0–14.6)
RDW: 18.6 % — ABNORMAL HIGH (ref 11.5–15.5)
WBC Count: 2.7 10*3/uL — ABNORMAL LOW (ref 4.0–10.3)
WBC: 2 10*3/uL — AB (ref 4.0–10.3)

## 2017-06-08 LAB — PREPARE RBC (CROSSMATCH)

## 2017-06-08 LAB — ABO/RH: ABO/RH(D): O POS

## 2017-06-08 MED ORDER — ACETAMINOPHEN 325 MG PO TABS
650.0000 mg | ORAL_TABLET | Freq: Once | ORAL | Status: AC
  Administered 2017-06-08: 650 mg via ORAL

## 2017-06-08 MED ORDER — HEPARIN SOD (PORK) LOCK FLUSH 100 UNIT/ML IV SOLN
500.0000 [IU] | Freq: Every day | INTRAVENOUS | Status: AC | PRN
  Administered 2017-06-08: 500 [IU]
  Filled 2017-06-08: qty 5

## 2017-06-08 MED ORDER — SODIUM CHLORIDE 0.9 % IV SOLN
250.0000 mL | Freq: Once | INTRAVENOUS | Status: AC
  Administered 2017-06-08: 250 mL via INTRAVENOUS

## 2017-06-08 MED ORDER — DIPHENHYDRAMINE HCL 25 MG PO CAPS
ORAL_CAPSULE | ORAL | Status: AC
  Filled 2017-06-08: qty 1

## 2017-06-08 MED ORDER — SODIUM CHLORIDE 0.9% FLUSH
10.0000 mL | INTRAVENOUS | Status: AC | PRN
  Administered 2017-06-08: 10 mL
  Filled 2017-06-08: qty 10

## 2017-06-08 MED ORDER — DIPHENHYDRAMINE HCL 25 MG PO CAPS
25.0000 mg | ORAL_CAPSULE | Freq: Once | ORAL | Status: AC
  Administered 2017-06-08: 25 mg via ORAL

## 2017-06-08 MED ORDER — ACETAMINOPHEN 325 MG PO TABS
ORAL_TABLET | ORAL | Status: AC
  Filled 2017-06-08: qty 2

## 2017-06-08 NOTE — Telephone Encounter (Signed)
Received notification of pt abnormal labs from [facility]: Hgb 7.4 and Plt 9. Per [name], 2U plt ordered and 1U PRBCs. Pt scheduled in infusion for plt (2/4) and [facility] for blood (2/5). Unable to reach pt on phone, so called pt wife to let pt know to return to [facility] for type&screen and plt transfusion. Pt wife noted that pt had been feeling fatigued. Pt received 2U plt and discussed appt on 2/5 at sickle and gave pt map to locate. CBC drawn again after transfusion with Hgb of 6.6. Note left on desk for RN to share with MD on 2/5 and noted pt already scheduled for 1U PRBCs on 2/5 at [facility].

## 2017-06-08 NOTE — Patient Instructions (Signed)
Platelet Transfusion, Care After °Refer to this sheet in the next few weeks. These instructions provide you with information about caring for yourself after your procedure. Your health care provider may also give you more specific instructions. Your treatment has been planned according to current medical practices, but problems sometimes occur. Call your health care provider if you have any problems or questions after your procedure. °What can I expect after the procedure? °After the procedure, it is common to have: °· Bruising and soreness at the IV site. °· Fever or chills within the first 48 hours of your transfusion. ° °Follow these instructions at home: °· Take medicines only as directed by your health care provider. Ask your health care provider if you can take an over-the-counter pain reliever in case you have a fever or headache a day or two after your transfusion. °· Return to your normal activities as directed by your health care provider. °Contact a health care provider if: °· You have a fever. °· You have a headache. °· You have redness, swelling, or pain at your IV site. °· You have skin itching or a rash. °· You vomit. °· You feel unusually tired or weak. °Get help right away if: °· You have trouble breathing. °· You have a decreased amount of urine or you urinate less often than you normally do. °· Your urine is darker than normal. °· You have pain in your back, abdomen, or chest. °· You have cool, clammy skin. °· You have a rapid heartbeat. °This information is not intended to replace advice given to you by your health care provider. Make sure you discuss any questions you have with your health care provider. °Document Released: 05/12/2014 Document Revised: 09/27/2015 Document Reviewed: 03/01/2014 °Elsevier Interactive Patient Education © 2018 Elsevier Inc. ° °

## 2017-06-09 ENCOUNTER — Ambulatory Visit (HOSPITAL_COMMUNITY)
Admission: RE | Admit: 2017-06-09 | Discharge: 2017-06-09 | Disposition: A | Discharge status: Home / Self Care | Payer: BLUE CROSS/BLUE SHIELD | Source: Ambulatory Visit | Attending: Hematology | Admitting: Hematology

## 2017-06-09 DIAGNOSIS — D630 Anemia in neoplastic disease: Secondary | ICD-10-CM | POA: Diagnosis not present

## 2017-06-09 DIAGNOSIS — D709 Neutropenia, unspecified: Secondary | ICD-10-CM | POA: Diagnosis not present

## 2017-06-09 DIAGNOSIS — R000 Tachycardia, unspecified: Secondary | ICD-10-CM | POA: Diagnosis not present

## 2017-06-09 DIAGNOSIS — C8448 Peripheral T-cell lymphoma, not classified, lymph nodes of multiple sites: Secondary | ICD-10-CM | POA: Diagnosis not present

## 2017-06-09 DIAGNOSIS — Z85820 Personal history of malignant melanoma of skin: Secondary | ICD-10-CM | POA: Diagnosis not present

## 2017-06-09 DIAGNOSIS — D649 Anemia, unspecified: Secondary | ICD-10-CM | POA: Insufficient documentation

## 2017-06-09 DIAGNOSIS — I313 Pericardial effusion (noninflammatory): Secondary | ICD-10-CM | POA: Diagnosis not present

## 2017-06-09 DIAGNOSIS — D6481 Anemia due to antineoplastic chemotherapy: Secondary | ICD-10-CM | POA: Diagnosis not present

## 2017-06-09 DIAGNOSIS — D696 Thrombocytopenia, unspecified: Secondary | ICD-10-CM | POA: Diagnosis not present

## 2017-06-09 LAB — PREPARE PLATELET PHERESIS
UNIT DIVISION: 0
Unit division: 0

## 2017-06-09 LAB — BPAM PLATELET PHERESIS
BLOOD PRODUCT EXPIRATION DATE: 201902052359
Blood Product Expiration Date: 201902052359
ISSUE DATE / TIME: 201902041549
ISSUE DATE / TIME: 201902041459
UNIT TYPE AND RH: 6200
Unit Type and Rh: 7300

## 2017-06-09 LAB — PREPARE RBC (CROSSMATCH)

## 2017-06-09 MED ORDER — SODIUM CHLORIDE 0.9 % IV SOLN: Freq: Once | INTRAVENOUS | Status: DC

## 2017-06-09 MED ORDER — SODIUM CHLORIDE 0.9% FLUSH
10.0000 mL | INTRAVENOUS | Status: AC | PRN
  Administered 2017-06-09: 10 mL

## 2017-06-09 MED ORDER — HEPARIN SOD (PORK) LOCK FLUSH 100 UNIT/ML IV SOLN
500.0000 [IU] | INTRAVENOUS | Status: AC | PRN
  Administered 2017-06-09: 500 [IU]
  Filled 2017-06-09: qty 5

## 2017-06-09 NOTE — Progress Notes (Signed)
Diagnosis: Anemia  Provider: Sullivan Lone, MD  Treatment: Mr. Onorato came into [facility] [place] today to received one unit of PRBC via PAC. Patient tolerated well. Vitals stable. PAC deaccessed and heparin locked. Discharge instructions reviewed. Patient verbalized understanding. Patient alert, oriented, verbal and ambulatory at time of discharge.

## 2017-06-09 NOTE — Discharge Instructions (Signed)

## 2017-06-11 ENCOUNTER — Other Ambulatory Visit: Payer: Self-pay | Admitting: Hematology

## 2017-06-11 ENCOUNTER — Telehealth: Payer: Self-pay | Admitting: *Deleted

## 2017-06-11 ENCOUNTER — Other Ambulatory Visit: Payer: Self-pay | Admitting: *Deleted

## 2017-06-11 MED ORDER — ACYCLOVIR 400 MG PO TABS: 400.0000 mg | ORAL_TABLET | Freq: Two times a day (BID) | ORAL | 0 refills | Status: DC

## 2017-06-11 MED ORDER — MAGIC MOUTHWASH W/LIDOCAINE: ORAL | 0 refills | Status: DC

## 2017-06-11 NOTE — Telephone Encounter (Signed)
Pt called c/o increased mucositis since last treatment last week.  Difficulty eating/drinking.  Pt already taking fluconazole and nystatin solution.  Pt also doing oral rinses with warm water/salt.  Pt denies fever or other symptoms.  Reviewed with [name].  Rx sent for magic mouthwash with lidocaine and acyclovir to pt's pharmacy.  Pt instructed to inform us if pain worsens or fevers develop.  Pt verbalized understanding.

## 2017-06-12 ENCOUNTER — Other Ambulatory Visit: Payer: Self-pay | Admitting: *Deleted

## 2017-06-12 DIAGNOSIS — C8442 Peripheral T-cell lymphoma, not classified, intrathoracic lymph nodes: Secondary | ICD-10-CM

## 2017-06-12 LAB — BPAM RBC
BLOOD PRODUCT EXPIRATION DATE: 201903042359
BLOOD PRODUCT EXPIRATION DATE: 201903052359
ISSUE DATE / TIME: 201902050918
Unit Type and Rh: 5100
Unit Type and Rh: 5100

## 2017-06-12 LAB — TYPE AND SCREEN
ABO/RH(D): O POS
Antibody Screen: NEGATIVE
UNIT DIVISION: 0
Unit division: 0

## 2017-06-12 NOTE — Progress Notes (Signed)
HEMATOLOGY/ONCOLOGY CLINIC NOTE  Date of Service: 06/15/2017  Patient Care Team: Hulan Fess, MD as PCP - General (Family Medicine)  CHIEF COMPLAINTS/PURPOSE OF CONSULTATION:   F/u for high gradeT cell NHL  CURRENT THERAPY: R-CHOEP s/p 6 cycles   HISTORY OF PRESENTING ILLNESS:  See previous note for details  INTERVAL HISTORY   Joseph Powell is a wonderful 62 y.o. male here for his scheduled followup after finishing C6 of chemotherapy. The patient's last visit with [place] was on 05/25/17. He is accompanied today by his wife. The pt reports that he is doing well overall and is currently keeping up with work by working from home. He is awaiting a PET and BM next week. He will also see Dr Dellis Filbert on 07/02/17. He notes he is drinking supplements while not having a very strong appetite. He notes urgent urination and having some leakage; he notes it occurs during the treatment cycles and that it got a little better and has not occurred with cramping.  He did have symptomatic anemia hgb 6.6 and platelets of 9k on 2/4/219 after his last treatment needed PRBC and platelet transfusion . COunts are continuing to improve. hgb up to 8.5 and platelets are at 50k.  No bleeding. No fevers/chills/night sweats.  Grade 1 mucositis - resolving. Was prescribed magic mouthwash, sucralfate, NACL/NAHCO3 mouthwashes.  On review of systems, pt reports resolved mouth sores, urgent urination, maintained weight, and denies fevers, chills, abdominal pains, leg swelling, and any other symptoms.    MEDICAL HISTORY:  Past Medical History:  Diagnosis Date  . Allergic rhinitis   . Asthma    slight  . Bell's palsy   . GERD (gastroesophageal reflux disease)   . Gout   . Hypercholesteremia   . Melanoma ([place])   . Osteoarthritis   . Sleep apnea    Resolving  . Thrombocytopenia ([place])   . Tubular adenoma     SURGICAL HISTORY: Past Surgical History:  Procedure Laterality Date  . IR FLUORO GUIDE PORT INSERTION  RIGHT  02/17/2017  . IR US GUIDE VASC ACCESS RIGHT  02/17/2017  . KNEE SURGERY  1993  . MELANOMA EXCISION    . NASAL RECONSTRUCTION    . VASECTOMY  1999    SOCIAL HISTORY: Social History   Socioeconomic History  . Marital status: Married    Spouse name: Not on file  . Number of children: Not on file  . Years of education: Not on file  . Highest education level: Not on file  Social Needs  . Financial resource strain: Not on file  . Food insecurity - worry: Not on file  . Food insecurity - inability: Not on file  . Transportation needs - medical: Not on file  . Transportation needs - non-medical: Not on file  Occupational History  . Occupation: Risk analyst  Tobacco Use  . Smoking status: Never Smoker  . Smokeless tobacco: Never Used  Substance and Sexual Activity  . Alcohol use: Yes    Alcohol/week: 1.0 oz    Types: 2 Standard drinks or equivalent per week    Comment: socially  . Drug use: No  . Sexual activity: Not on file  Other Topics Concern  . Not on file  Social History Narrative  . Not on file    FAMILY HISTORY: Family History  Problem Relation Age of Onset  . Diabetes Mother   . Asthma Mother   . Gout Brother   . Colon cancer Paternal Uncle   .  Esophageal cancer Neg Hx   . Stomach cancer Neg Hx     ALLERGIES:  is allergic to penicillins.  MEDICATIONS:  Current Outpatient Medications  Medication Sig Dispense Refill  . acyclovir (ZOVIRAX) 400 MG tablet Take 1 tablet (400 mg total) by mouth 2 (two) times daily. 60 tablet 0  . albuterol (PROVENTIL HFA;VENTOLIN HFA) 108 (90 Base) MCG/ACT inhaler Inhale 1-2 puffs into the lungs every 6 (six) hours as needed for wheezing or shortness of breath.    . carvedilol (COREG) 3.125 MG tablet Take 1 tablet (3.125 mg total) by mouth 2 (two) times daily with a meal. 60 tablet 3  . chlorpheniramine (CHLOR-TRIMETON) 4 MG tablet Take 4 mg every 4 (four) hours as needed by mouth for allergies.    Marland Kitchen dronabinol (MARINOL)  5 MG capsule Take 1 capsule (5 mg total) by mouth 2 (two) times daily before a meal. 60 capsule 0  . fluconazole (DIFLUCAN) 200 MG tablet Take 1 tablet (200 mg total) by mouth daily. Hold 24h prior to chemotherapy 30 tablet 2  . levofloxacin (LEVAQUIN) 500 MG tablet Take 1 tablet (500 mg total) by mouth daily. 7 tablet 0  . lidocaine-prilocaine (EMLA) cream Apply to affected area once 30 g 3  . magic mouthwash w/lidocaine SOLN Swish and Spit 5 ml three times daily as needed for mouth pain. 120 mL 0  . mometasone-formoterol (DULERA) 100-5 MCG/ACT AERO Inhale 2 puffs into the lungs 2 (two) times daily. 1 Inhaler 11  . pantoprazole (PROTONIX) 40 MG tablet Take 40 mg by mouth daily.     . pravastatin (PRAVACHOL) 40 MG tablet Take 40 mg by mouth daily.    . predniSONE (DELTASONE) 20 MG tablet Take 3 tablets (60 mg total) by mouth daily. Take on days 1-5 of chemotherapy. 15 tablet 5  . traZODone (DESYREL) 50 MG tablet Take 1-2 tablets (50-100 mg total) by mouth at bedtime. 60 tablet 2   Current Facility-Administered Medications  Medication Dose Route Frequency Provider Last Rate Last Dose  . 0.9 %  sodium chloride infusion  500 mL Intravenous Continuous Milus Banister, MD        REVIEW OF SYSTEMS:    10 Point review of Systems was done is negative except as noted above.  PHYSICAL EXAMINATION: ECOG PERFORMANCE STATUS: 1 - Symptomatic but completely ambulatory  . Vitals:   06/15/17 0838  BP: 114/71  Pulse: 88  Resp: 20  Temp: 97.9 F (36.6 C)  SpO2: 100%   Filed Weights   06/15/17 0838  Weight: 169 lb 3.2 oz (76.7 kg)   .Body mass index is 23.6 kg/m.  GENERAL:alert, in no acute distress and comfortable SKIN: no acute rashes, no significant lesions EYES: conjunctiva are pink and non-injected, sclera anicteric OROPHARYNX: MMM, no exudates, no oropharyngeal erythema or ulceration NECK: supple, no JVD LYMPH: no obvious lymphadenopathy in cervical, axillary or inguinal regions that  I could appreciate LUNGS: clear to auscultation b/l with normal respiratory effort HEART: regular rate & rhythm ABDOMEN:  normoactive bowel sounds , non tender, not distended. Borderline palpable splenomegaly, no overt hepatomegaly.  Extremity: no pedal edema PSYCH: alert & oriented x 3 with fluent speech NEURO: no focal motor/sensory deficits  LABORATORY DATA:  I have reviewed the data as listed  . CBC Latest Ref Rng & Units 06/15/2017 06/08/2017 06/08/2017  WBC 4.0 - 10.3 K/uL 17.6(H) 2.0(L) 2.7(L)  Hemoglobin 13.0 - 17.1 g/dL - - -  Hematocrit 38.4 - 49.9 % 27.3(L) 20.6(L) 23.8(L)  Platelets 140 - 400 K/uL 50(L) 35(L) 9(LL)  hgb 8.5  . CBC Latest Ref Rng & Units 06/15/2017 06/08/2017 06/08/2017  WBC 4.0 - 10.3 K/uL 17.6(H) 2.0(L) 2.7(L)  Hemoglobin 13.0 - 17.1 g/dL - - -  Hematocrit 38.4 - 49.9 % 27.3(L) 20.6(L) 23.8(L)  Platelets 140 - 400 K/uL 50(L) 35(L) 9(LL)    . CMP Latest Ref Rng & Units 06/15/2017 06/01/2017 05/11/2017  Glucose 70 - 140 mg/dL 167(H) 137 187(H)  BUN 7 - 26 mg/dL 6(L) 7 8  Creatinine 0.70 - 1.30 mg/dL 0.80 0.71 0.72  Sodium 136 - 145 mmol/L 140 141 140  Potassium 3.5 - 5.1 mmol/L 3.8 4.1 4.3  Chloride 98 - 109 mmol/L 104 106 104  CO2 22 - 29 mmol/L '26 27 27  '$ Calcium 8.4 - 10.4 mg/dL 8.8 9.0 9.4  Total Protein 6.4 - 8.3 g/dL 5.9(L) 5.8(L) 6.2(L)  Total Bilirubin 0.2 - 1.2 mg/dL 0.5 0.4 0.5  Alkaline Phos 40 - 150 U/L 225(H) 201(H) 266(H)  AST 5 - 34 U/L '20 18 16  '$ ALT 0 - 55 U/L '14 13 18   '$ Uric acid 3.1     RADIOGRAPHIC STUDIES:  I have personally reviewed the radiological images as listed and agreed with the findings in the report. Dg Chest 2 View  Result Date: 05/25/2017 CLINICAL DATA:  62 year old male with lymphoma.  Productive cough. EXAM: CHEST  2 VIEW COMPARISON:  PET-CT 04/30/2017 and earlier. FINDINGS: Right chest porta cath in place. Normal cardiac size and mediastinal contours. Visualized tracheal air column is within normal limits. Lung  volumes are within normal limits. No pneumothorax, pulmonary edema, pleural effusion or confluent pulmonary opacity. No acute osseous abnormality identified. Negative visible bowel gas pattern. IMPRESSION: No acute cardiopulmonary abnormality. Electronically Signed   By: Genevie Ann M.D.   On: 05/25/2017 14:33    ASSESSMENT & PLAN:    Joseph Powell is a 62 y.o. 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 Splenomegaly with numerous low-attenuation splenic masses. Several small low-attenuation liver masses. The leading consideration is lymphoma, although metastatic disease with unknown primary in on the differential. S/p 4 cycles - PET scan was completed on 04/30/17 and showed probable complete metabolic response. However, it also showed he has a new small pericardial effusion. Noted to have slight tachycardia which was confirmed by EKG on 05/01/17.(likely from Anemia) Rpt ECHO 05/26/2017 - EF 55-60%, no pericardial effusion. Resolution of consitutional symptoms and cervical lymphadenopathy.  #3 Acute on Chronic Thrombocytopenia. Patient has some chronic thrombocytopenia 85-125k for a few years. Platelets upon initial visit are down to 36k in the setting of newly diagnosed lymphoma and treatment with R-CHOEP and from hypersplenism but then improved. PLt counts had drop 9k after last cycle of R-CHOEP. Needed platelet transfusion. Platelets now bounced back to 50k  #4 Elevated ALK PO4 - stable - likely from ctx/neulasta  # 5 Anemia from NHL+ Chemotherapy - had dropped to 6.6-> improved to 8.5 post PRBC transfusion  # 6 Mild neutropenia - resolved with Neulasta  # 7 previous Significant Oro-pharyngeal thrush -- causing low grade fever and change in taste -- treated aggressively with Nystatin mouthwashes and PO Fluconazole. Resolved -- continuing fluconazole  prophylaxis.  Plan - overall appears to be have tolerated R-CHOEP relatively well.  -did need PRBC and platelet transfusions from the 1st time after 6th cycle of  R-CHOEP. -previous thrush resolved - continue po fluconazole for prophylaxis. -mx of hyperglycemia per PCP -Discussed our involvement with pt after transplant cycles begin, and encouraged pt and his wife to let us know if they need anything of [place]. - he will f/u for PET/CT and BM Bx for post 6 cycles of induction chemotherapy with R-CHOEP to get pre-transplant baseline as scheduled next week -he has f/u at [facility] and shall be get rpt labs there -we will defer transplant cares to Dr Dellis Filbert   #2 History of Melanoma, 2009 -Followed by Dr. Allyson Sabal for continued skin screening   #3 Patient Active Problem List   Diagnosis Date Noted  . Peripheral T cell lymphoma of intrathoracic lymph nodes ([place]) 03/11/2017  . Counseling regarding advanced care planning and goals of care 02/12/2017  . Mature T/NK-cell lymphomas, unspecified, lymph nodes of multiple sites ([place]) 02/10/2017  . Cough variant asthma vs UACS 01/27/2017  . Dyslipidemia 08/05/2016  . Mild asthma 08/05/2016  . DJD (degenerative joint disease) 08/05/2016  . HLD (hyperlipidemia) 07/27/2016  . GERD (gastroesophageal reflux disease) 07/27/2016  . Gout 07/27/2016  . Discomfort in chest 07/27/2016  . OSA (obstructive sleep apnea) 09/19/2013  . Thrombocytopenia ([place]) 04/14/2011  . SCHATZKI'S RING 08/21/2009   #4 Left sided jaw pain resolved  #5 Urinary urgency -UA/UCx neg for infection -Discussed that cyclophosphamide or mucositis could be affecting his urgent urination; continued to encourage pt to stay well hydrated.  -improving symptoms post chemotherapy   RTC with Dr Irene Limbo in 3 months with labs F/u for schedule BM Bx and PET/CT as per schedule F/u with transplant team at [facility] to proceed with transplant consideration as per scheduled appointments.   All of the  patients questions were answered with apparent satisfaction. The patient knows to call the clinic with any problems, questions or concerns.  I spent 25 minutes counseling the patient face to face. The total time spent in the appointment was 30 minutes and more than 50% was on counseling and direct patient cares.    Sullivan Lone MD [place] AAHIVMS [facility] Hematology/Oncology Physician [facility]  (Office):       [phone] (Work cell):  [phone] (Fax):           [phone]  This document serves as a record of services personally performed by Sullivan Lone, MD. It was created on his behalf by Baldwin [place], a trained medical scribe. The creation of this record is based on the scribe's personal observations and the provider's statements to them.   .I have reviewed the above documentation for accuracy and completeness, and I agree with the above. Brunetta Genera MD MS

## 2017-06-15 ENCOUNTER — Encounter: Payer: Self-pay | Admitting: Hematology

## 2017-06-15 ENCOUNTER — Inpatient Hospital Stay: Payer: BLUE CROSS/BLUE SHIELD | Admitting: Hematology

## 2017-06-15 ENCOUNTER — Telehealth: Payer: Self-pay

## 2017-06-15 ENCOUNTER — Inpatient Hospital Stay: Payer: BLUE CROSS/BLUE SHIELD

## 2017-06-15 VITALS — BP 114/71 | HR 88 | Temp 97.9°F | Resp 20 | Ht 71.0 in | Wt 169.2 lb

## 2017-06-15 DIAGNOSIS — I313 Pericardial effusion (noninflammatory): Secondary | ICD-10-CM | POA: Diagnosis not present

## 2017-06-15 DIAGNOSIS — D649 Anemia, unspecified: Secondary | ICD-10-CM | POA: Diagnosis not present

## 2017-06-15 DIAGNOSIS — Z85820 Personal history of malignant melanoma of skin: Secondary | ICD-10-CM | POA: Diagnosis not present

## 2017-06-15 DIAGNOSIS — M62830 Muscle spasm of back: Secondary | ICD-10-CM | POA: Diagnosis not present

## 2017-06-15 DIAGNOSIS — R000 Tachycardia, unspecified: Secondary | ICD-10-CM | POA: Diagnosis not present

## 2017-06-15 DIAGNOSIS — M545 Low back pain: Secondary | ICD-10-CM | POA: Diagnosis not present

## 2017-06-15 DIAGNOSIS — C8498 Mature T/NK-cell lymphomas, unspecified, lymph nodes of multiple sites: Secondary | ICD-10-CM

## 2017-06-15 DIAGNOSIS — M5137 Other intervertebral disc degeneration, lumbosacral region: Secondary | ICD-10-CM | POA: Diagnosis not present

## 2017-06-15 DIAGNOSIS — D6481 Anemia due to antineoplastic chemotherapy: Secondary | ICD-10-CM | POA: Diagnosis not present

## 2017-06-15 DIAGNOSIS — D696 Thrombocytopenia, unspecified: Secondary | ICD-10-CM | POA: Diagnosis not present

## 2017-06-15 DIAGNOSIS — R300 Dysuria: Secondary | ICD-10-CM | POA: Diagnosis not present

## 2017-06-15 DIAGNOSIS — M9903 Segmental and somatic dysfunction of lumbar region: Secondary | ICD-10-CM | POA: Diagnosis not present

## 2017-06-15 DIAGNOSIS — C8442 Peripheral T-cell lymphoma, not classified, intrathoracic lymph nodes: Secondary | ICD-10-CM

## 2017-06-15 DIAGNOSIS — D630 Anemia in neoplastic disease: Secondary | ICD-10-CM | POA: Diagnosis not present

## 2017-06-15 DIAGNOSIS — D709 Neutropenia, unspecified: Secondary | ICD-10-CM | POA: Diagnosis not present

## 2017-06-15 DIAGNOSIS — C8448 Peripheral T-cell lymphoma, not classified, lymph nodes of multiple sites: Secondary | ICD-10-CM | POA: Diagnosis not present

## 2017-06-15 LAB — CMP (CANCER CENTER ONLY)
ALT: 14 U/L (ref 0–55)
AST: 20 U/L (ref 5–34)
Albumin: 3.7 g/dL (ref 3.5–5.0)
Alkaline Phosphatase: 225 U/L — ABNORMAL HIGH (ref 40–150)
Anion gap: 10 (ref 3–11)
BUN: 6 mg/dL — ABNORMAL LOW (ref 7–26)
CO2: 26 mmol/L (ref 22–29)
Calcium: 8.8 mg/dL (ref 8.4–10.4)
Chloride: 104 mmol/L (ref 98–109)
Creatinine: 0.8 mg/dL (ref 0.70–1.30)
GFR, Est AFR Am: >60 mL/min
GFR, Estimated: >60 mL/min
Glucose, Bld: 167 mg/dL — ABNORMAL HIGH (ref 70–140)
Potassium: 3.8 mmol/L (ref 3.5–5.1)
Sodium: 140 mmol/L (ref 136–145)
Total Bilirubin: 0.5 mg/dL (ref 0.2–1.2)
Total Protein: 5.9 g/dL — ABNORMAL LOW (ref 6.4–8.3)

## 2017-06-15 LAB — CBC WITH DIFFERENTIAL (CANCER CENTER ONLY)
BASOS ABS: 0.1 10*3/uL (ref 0.0–0.1)
BASOS PCT: 0 %
Eosinophils Absolute: 0 10*3/uL (ref 0.0–0.5)
Eosinophils Relative: 0 %
HEMATOCRIT: 27.3 % — AB (ref 38.4–49.9)
HEMOGLOBIN: 8.5 g/dL — AB (ref 13.0–17.1)
Lymphocytes Relative: 3 %
Lymphs Abs: 0.5 10*3/uL — ABNORMAL LOW (ref 0.9–3.3)
MCH: 27.5 pg (ref 27.2–33.4)
MCHC: 31.3 g/dL — AB (ref 32.0–36.0)
MCV: 88.1 fL (ref 79.3–98.0)
MONOS PCT: 2 %
Monocytes Absolute: 0.4 10*3/uL (ref 0.1–0.9)
NEUTROS ABS: 16.7 10*3/uL — AB (ref 1.5–6.5)
NEUTROS PCT: 95 %
Platelet Count: 50 10*3/uL — ABNORMAL LOW (ref 140–400)
RBC: 3.1 MIL/uL — ABNORMAL LOW (ref 4.20–5.82)
RDW: 20.3 % — ABNORMAL HIGH (ref 11.0–14.6)
WBC Count: 17.6 10*3/uL — ABNORMAL HIGH (ref 4.0–10.3)

## 2017-06-15 LAB — LACTATE DEHYDROGENASE: LDH: 329 U/L — ABNORMAL HIGH (ref 125–245)

## 2017-06-15 NOTE — Telephone Encounter (Signed)
Printed avs and calender for upcoming appointments.per 2/11 los

## 2017-06-15 NOTE — Patient Instructions (Signed)
Thank you for choosing [facility] to provide your oncology and hematology care.  To afford each patient quality time with our providers, please arrive 30 minutes before your scheduled appointment time.  If you arrive late for your appointment, you may be asked to reschedule.  We strive to give you quality time with our providers, and arriving late affects you and other patients whose appointments are after yours.  If you are a no show for multiple scheduled visits, you may be dismissed from the clinic at the providers discretion.   Again, thank you for choosing [facility], our hope is that these requests will decrease the amount of time that you wait before being seen by our physicians.  ______________________________________________________________________ Should you have questions after your visit to the [facility], please contact our office at [phone] between the hours of 8:30 and 4:30 p.m.    Voicemails left after 4:30p.m will not be returned until the following business day.   For prescription refill requests, please have your pharmacy contact us directly.  Please also try to allow 48 hours for prescription requests.   Please contact the scheduling department for questions regarding scheduling.  For scheduling of procedures such as PET scans, CT scans, MRI, Ultrasound, etc please contact central scheduling at [phone].   Resources For Cancer Patients and Caregivers:  American Cancer Society:  800-227-2345  Can help patients locate various types of support and financial assistance Cancer Care: 1-800-813-HOPE (4673) Provides financial assistance, online support groups, medication/co-pay assistance.   [place] DSS:  336-641-3447 Where to apply for food stamps, Medicaid, and utility assistance Medicare Rights Center: 800-333-4114 Helps people with Medicare understand their rights and benefits, navigate the Medicare system, and secure the  quality healthcare they deserve SCAT: 336-333-6589 [employer]'s shared-ride transportation service for eligible riders who have a disability that prevents them from riding the fixed route bus.   For additional information on assistance programs please contact our social worker:   Grier Hock/Abigail Elmore:  [phone] 

## 2017-06-16 DIAGNOSIS — M5137 Other intervertebral disc degeneration, lumbosacral region: Secondary | ICD-10-CM | POA: Diagnosis not present

## 2017-06-16 DIAGNOSIS — M9903 Segmental and somatic dysfunction of lumbar region: Secondary | ICD-10-CM | POA: Diagnosis not present

## 2017-06-16 DIAGNOSIS — M545 Low back pain: Secondary | ICD-10-CM | POA: Diagnosis not present

## 2017-06-16 DIAGNOSIS — M62830 Muscle spasm of back: Secondary | ICD-10-CM | POA: Diagnosis not present

## 2017-06-17 DIAGNOSIS — M545 Low back pain: Secondary | ICD-10-CM | POA: Diagnosis not present

## 2017-06-17 DIAGNOSIS — M9903 Segmental and somatic dysfunction of lumbar region: Secondary | ICD-10-CM | POA: Diagnosis not present

## 2017-06-17 DIAGNOSIS — M5137 Other intervertebral disc degeneration, lumbosacral region: Secondary | ICD-10-CM | POA: Diagnosis not present

## 2017-06-17 DIAGNOSIS — M62830 Muscle spasm of back: Secondary | ICD-10-CM | POA: Diagnosis not present

## 2017-06-18 DIAGNOSIS — M545 Low back pain: Secondary | ICD-10-CM | POA: Diagnosis not present

## 2017-06-18 DIAGNOSIS — M62830 Muscle spasm of back: Secondary | ICD-10-CM | POA: Diagnosis not present

## 2017-06-18 DIAGNOSIS — M5137 Other intervertebral disc degeneration, lumbosacral region: Secondary | ICD-10-CM | POA: Diagnosis not present

## 2017-06-18 DIAGNOSIS — M9903 Segmental and somatic dysfunction of lumbar region: Secondary | ICD-10-CM | POA: Diagnosis not present

## 2017-06-19 ENCOUNTER — Encounter: Payer: Self-pay | Admitting: *Deleted

## 2017-06-19 ENCOUNTER — Encounter (HOSPITAL_COMMUNITY): Payer: Self-pay

## 2017-06-19 ENCOUNTER — Other Ambulatory Visit: Payer: Self-pay | Admitting: General Surgery

## 2017-06-19 ENCOUNTER — Other Ambulatory Visit: Payer: Self-pay | Admitting: Radiology

## 2017-06-19 ENCOUNTER — Other Ambulatory Visit: Payer: Self-pay | Admitting: Student

## 2017-06-19 ENCOUNTER — Ambulatory Visit (HOSPITAL_COMMUNITY)
Admission: RE | Admit: 2017-06-19 | Discharge: 2017-06-19 | Disposition: A | Discharge status: Home / Self Care | Payer: BLUE CROSS/BLUE SHIELD | Source: Ambulatory Visit | Attending: Hematology | Admitting: Hematology

## 2017-06-19 DIAGNOSIS — I2510 Atherosclerotic heart disease of native coronary artery without angina pectoris: Secondary | ICD-10-CM | POA: Insufficient documentation

## 2017-06-19 DIAGNOSIS — I313 Pericardial effusion (noninflammatory): Secondary | ICD-10-CM | POA: Insufficient documentation

## 2017-06-19 DIAGNOSIS — R161 Splenomegaly, not elsewhere classified: Secondary | ICD-10-CM | POA: Insufficient documentation

## 2017-06-19 DIAGNOSIS — R918 Other nonspecific abnormal finding of lung field: Secondary | ICD-10-CM | POA: Insufficient documentation

## 2017-06-19 DIAGNOSIS — I700 Atherosclerosis of aorta: Secondary | ICD-10-CM | POA: Diagnosis not present

## 2017-06-19 DIAGNOSIS — N200 Calculus of kidney: Secondary | ICD-10-CM | POA: Diagnosis not present

## 2017-06-19 DIAGNOSIS — C8300 Small cell B-cell lymphoma, unspecified site: Secondary | ICD-10-CM | POA: Diagnosis not present

## 2017-06-19 DIAGNOSIS — C8498 Mature T/NK-cell lymphomas, unspecified, lymph nodes of multiple sites: Secondary | ICD-10-CM | POA: Insufficient documentation

## 2017-06-19 DIAGNOSIS — J320 Chronic maxillary sinusitis: Secondary | ICD-10-CM | POA: Diagnosis not present

## 2017-06-19 LAB — GLUCOSE, CAPILLARY: GLUCOSE-CAPILLARY: 135 mg/dL — AB (ref 65–99)

## 2017-06-19 IMAGING — PT NM PET TUM IMG RESTAG (PS) SKULL BASE T - THIGH
1 series · 9 of 9 positions shown · non-contrast
Comparison: Multiple exams, including 04/30/2017

CLINICAL DATA: Subsequent treatment strategy for peripheral SWALLOW/WARNAKULASOORIYA
cell lymphoma, post induction chemotherapy, pre transplant
evaluation.

EXAM:
NUCLEAR MEDICINE PET SKULL BASE TO THIGH
TECHNIQUE: 8.5 mCi F-18 FDG was injected intravenously. Full-ring PET imaging
was performed from the skull base to thigh after the radiotracer. CT
data was obtained and used for attenuation correction and anatomic
localization.
FASTING BLOOD GLUCOSE:  Value: 135 mg/dl

[Series 1147: results mm oncology reading · 5.0mm · 0.90mm/px · 9 of 9 slices shown]
[im 1/9]
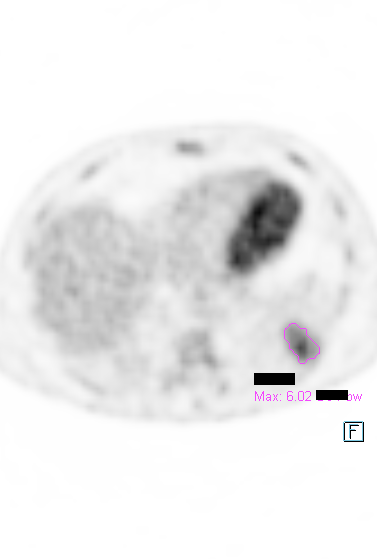
[im 2/9]
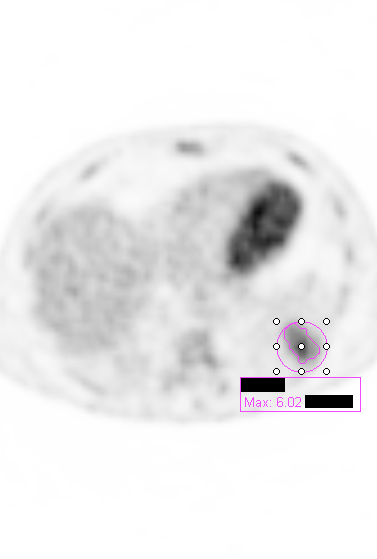
[im 3/9]
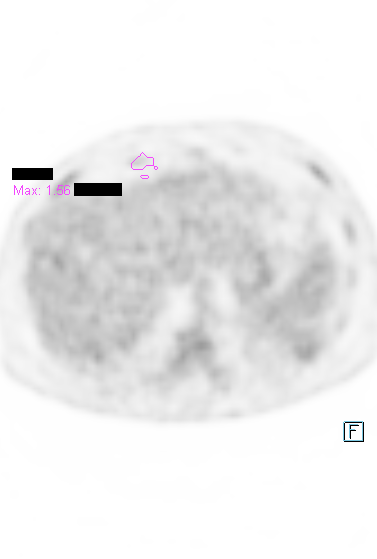
[im 4/9]
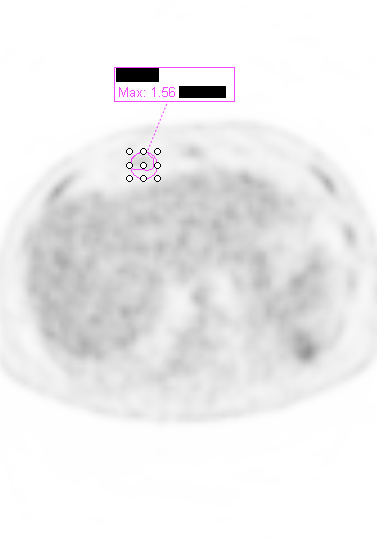
[im 5/9]
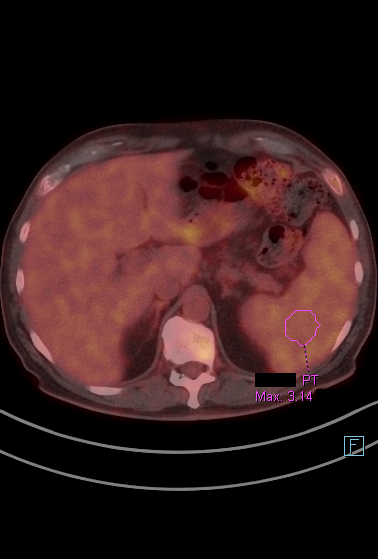
[im 6/9]
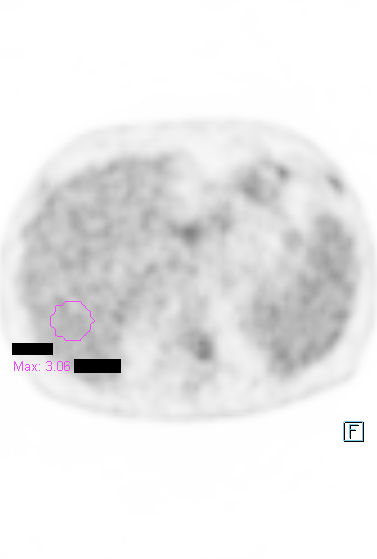
[im 7/9]
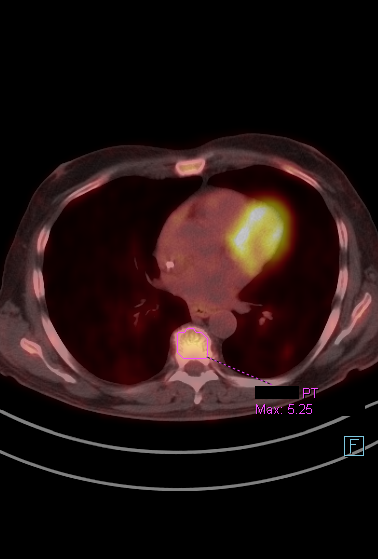
[im 8/9]
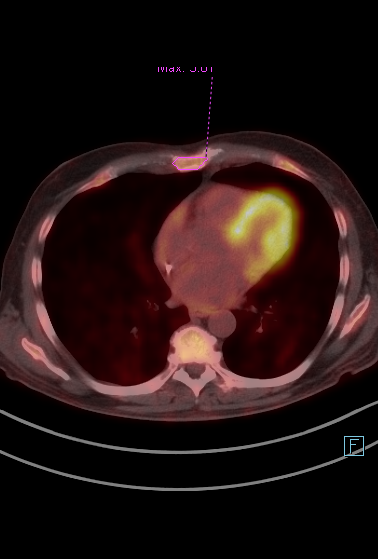
[im 9/9]
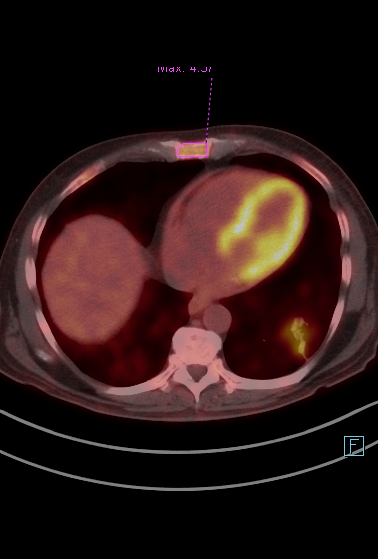

[9 of 9 positions shown; findings below may reference images not displayed]

FINDINGS: NECK

No hypermetabolic lymph nodes in the neck. Mildly asymmetric glottic
uptake but without CT correlate, probably physiologic.

Mild chronic right maxillary sinusitis. Bilateral mild carotid
atherosclerotic calcification.

CHEST

No hypermetabolic mediastinal or hilar nodes. 5.0 by 3.2 cm airspace
opacity in the left lower lobe on image 55/8, associated maximum SUV
6.0

New faint peripheral nodularity anteriorly in the right middle lobe
measuring up to 2.1 by 0.8 cm, maximum SUV in this vicinity 1.6.

New bandlike peripheral 1.0 by 2.3 cm posterior basal segment right
lower lobe opacity with maximum SUV 2.2.

Right Port-A-Cath tip: Right atrium. Reduced size of the pericardial
effusion.

Coronary, aortic arch, and branch vessel atherosclerotic vascular
disease.

ABDOMEN/PELVIS

No abnormal hypermetabolic activity within the liver, pancreas, or
adrenal glands. Splenic activity is similar to the level of activity
in the liver or only very minimally greater, demonstrating less of a
difference than on the prior exam.

Mild rind of density along the abdominal aorta represents
effectively treated prior lymphoma, no associated accentuated
metabolic activity at this time. No pathologic adenopathy.

Physiologic activity in bowel and stomach.

The spleen measures 15.0 by 5.3 by 11.2 cm (volume = 470 cm^3).

Aortoiliac atherosclerotic vascular disease. Nonobstructive right
nephrolithiasis with several 1-2 mm right mid kidney calculi and a 3
mm right kidney lower pole calculus. Punctate calcifications in the
prostate gland. The right testicle appears to be somewhat proximally
retracted along the spermatic cord.

SKELETON

Diffuse and somewhat heterogeneous skeletal uptake is still
accentuated but not as much as on the prior exam. For example a
midthoracic vertebra previously had a maximum SUV of 7.1 and
currently this level has maximum SUV of 5.3. The sternum has a
maximum SUV of 4.4, formerly 7.0.
IMPRESSION: 1. New airspace opacity in the left lower lobe has accentuated
metabolic activity with maximum SUV 6.0. Differential diagnostic
considerations include pneumonia and lymphomatous infiltrate.
Low-grade activity with more faint densities in the right middle
lobe and right lower lobe.
2. Mild splenomegaly, but splenic activity relative to the liver is
closer to normal today.
3. The diffuse but somewhat heterogeneous bony activity shown on the
prior exam is improved, but still remains elevated. Although
possibly due to granulocyte stimulation, widespread marrow
involvement is not readily excluded.
4. Reduction in volume of the pericardial effusion shown on the
prior exam.
5. No activity in the small rind of density along the abdominal
aorta representing effectively treated lymphoma.
6. Other imaging findings of potential clinical significance: Mild
chronic right maxillary sinusitis. Aortic Atherosclerosis
([id number]). Coronary atherosclerosis. Nonobstructive right
nephrolithiasis. Mild right testicle proximal retraction.

## 2017-06-19 MED ORDER — FLUDEOXYGLUCOSE F - 18 (FDG) INJECTION
8.4900 | Freq: Once | INTRAVENOUS | Status: AC
  Administered 2017-06-19: 8.49 via INTRAVENOUS

## 2017-06-22 ENCOUNTER — Ambulatory Visit (HOSPITAL_COMMUNITY)
Admission: RE | Admit: 2017-06-22 | Discharge: 2017-06-22 | Disposition: A | Discharge status: Home / Self Care | Payer: BLUE CROSS/BLUE SHIELD | Source: Ambulatory Visit | Attending: Hematology | Admitting: Hematology

## 2017-06-22 ENCOUNTER — Encounter (HOSPITAL_COMMUNITY): Payer: Self-pay

## 2017-06-22 ENCOUNTER — Other Ambulatory Visit: Payer: Self-pay | Admitting: Hematology

## 2017-06-22 DIAGNOSIS — Z9889 Other specified postprocedural states: Secondary | ICD-10-CM | POA: Insufficient documentation

## 2017-06-22 DIAGNOSIS — E7800 Pure hypercholesterolemia, unspecified: Secondary | ICD-10-CM | POA: Diagnosis not present

## 2017-06-22 DIAGNOSIS — I700 Atherosclerosis of aorta: Secondary | ICD-10-CM | POA: Diagnosis not present

## 2017-06-22 DIAGNOSIS — Z85820 Personal history of malignant melanoma of skin: Secondary | ICD-10-CM | POA: Diagnosis not present

## 2017-06-22 DIAGNOSIS — R918 Other nonspecific abnormal finding of lung field: Secondary | ICD-10-CM

## 2017-06-22 DIAGNOSIS — Z79899 Other long term (current) drug therapy: Secondary | ICD-10-CM | POA: Diagnosis not present

## 2017-06-22 DIAGNOSIS — R161 Splenomegaly, not elsewhere classified: Secondary | ICD-10-CM | POA: Insufficient documentation

## 2017-06-22 DIAGNOSIS — K219 Gastro-esophageal reflux disease without esophagitis: Secondary | ICD-10-CM | POA: Insufficient documentation

## 2017-06-22 DIAGNOSIS — D72829 Elevated white blood cell count, unspecified: Secondary | ICD-10-CM | POA: Insufficient documentation

## 2017-06-22 DIAGNOSIS — D696 Thrombocytopenia, unspecified: Secondary | ICD-10-CM | POA: Diagnosis not present

## 2017-06-22 DIAGNOSIS — M109 Gout, unspecified: Secondary | ICD-10-CM | POA: Insufficient documentation

## 2017-06-22 DIAGNOSIS — M1990 Unspecified osteoarthritis, unspecified site: Secondary | ICD-10-CM | POA: Insufficient documentation

## 2017-06-22 DIAGNOSIS — I313 Pericardial effusion (noninflammatory): Secondary | ICD-10-CM | POA: Insufficient documentation

## 2017-06-22 DIAGNOSIS — Z9221 Personal history of antineoplastic chemotherapy: Secondary | ICD-10-CM | POA: Diagnosis not present

## 2017-06-22 DIAGNOSIS — J320 Chronic maxillary sinusitis: Secondary | ICD-10-CM | POA: Diagnosis not present

## 2017-06-22 DIAGNOSIS — J45909 Unspecified asthma, uncomplicated: Secondary | ICD-10-CM | POA: Insufficient documentation

## 2017-06-22 DIAGNOSIS — N200 Calculus of kidney: Secondary | ICD-10-CM | POA: Insufficient documentation

## 2017-06-22 DIAGNOSIS — G4730 Sleep apnea, unspecified: Secondary | ICD-10-CM | POA: Insufficient documentation

## 2017-06-22 DIAGNOSIS — C8498 Mature T/NK-cell lymphomas, unspecified, lymph nodes of multiple sites: Secondary | ICD-10-CM | POA: Diagnosis not present

## 2017-06-22 DIAGNOSIS — C8440 Peripheral T-cell lymphoma, not classified, unspecified site: Secondary | ICD-10-CM | POA: Diagnosis not present

## 2017-06-22 DIAGNOSIS — Z7952 Long term (current) use of systemic steroids: Secondary | ICD-10-CM | POA: Insufficient documentation

## 2017-06-22 DIAGNOSIS — Z880 Allergy status to penicillin: Secondary | ICD-10-CM | POA: Diagnosis not present

## 2017-06-22 DIAGNOSIS — D7589 Other specified diseases of blood and blood-forming organs: Secondary | ICD-10-CM | POA: Diagnosis not present

## 2017-06-22 DIAGNOSIS — D649 Anemia, unspecified: Secondary | ICD-10-CM | POA: Insufficient documentation

## 2017-06-22 LAB — CBC WITH DIFFERENTIAL/PLATELET
BASOS PCT: 0 %
Basophils Absolute: 0 10*3/uL (ref 0.0–0.1)
EOS ABS: 0 10*3/uL (ref 0.0–0.7)
EOS PCT: 0 %
HEMATOCRIT: 31.7 % — AB (ref 39.0–52.0)
HEMOGLOBIN: 9.9 g/dL — AB (ref 13.0–17.0)
LYMPHS ABS: 1.9 10*3/uL (ref 0.7–4.0)
Lymphocytes Relative: 11 %
MCH: 28.6 pg (ref 26.0–34.0)
MCHC: 31.2 g/dL (ref 30.0–36.0)
MCV: 91.6 fL (ref 78.0–100.0)
METAMYELOCYTES PCT: 2 %
MYELOCYTES: 4 %
Monocytes Absolute: 1 10*3/uL (ref 0.1–1.0)
Monocytes Relative: 6 %
NEUTROS ABS: 14.3 10*3/uL — AB (ref 1.7–7.7)
Neutrophils Relative %: 77 %
Platelets: 75 10*3/uL — ABNORMAL LOW (ref 150–400)
RBC: 3.46 MIL/uL — ABNORMAL LOW (ref 4.22–5.81)
RDW: 19.6 % — ABNORMAL HIGH (ref 11.5–15.5)
WBC: 17.2 10*3/uL — ABNORMAL HIGH (ref 4.0–10.5)

## 2017-06-22 LAB — PROTIME-INR
INR: 1.05
PROTHROMBIN TIME: 13.6 s (ref 11.4–15.2)

## 2017-06-22 MED ORDER — FLUMAZENIL 0.5 MG/5ML IV SOLN
INTRAVENOUS | Status: AC
  Filled 2017-06-22: qty 5

## 2017-06-22 MED ORDER — MIDAZOLAM HCL 2 MG/2ML IJ SOLN
INTRAMUSCULAR | Status: AC | PRN
  Administered 2017-06-22: 1 mg via INTRAVENOUS
  Administered 2017-06-22 (×2): 0.5 mg via INTRAVENOUS

## 2017-06-22 MED ORDER — LIDOCAINE HCL 1 % IJ SOLN
INTRAMUSCULAR | Status: AC | PRN
  Administered 2017-06-22: 10 mL via INTRADERMAL

## 2017-06-22 MED ORDER — SODIUM CHLORIDE 0.9 % IV SOLN
INTRAVENOUS | Status: DC
  Administered 2017-06-22: 07:00:00 via INTRAVENOUS

## 2017-06-22 MED ORDER — FENTANYL CITRATE (PF) 100 MCG/2ML IJ SOLN
INTRAMUSCULAR | Status: AC | PRN
  Administered 2017-06-22: 25 ug via INTRAVENOUS
  Administered 2017-06-22: 50 ug via INTRAVENOUS
  Administered 2017-06-22: 25 ug via INTRAVENOUS

## 2017-06-22 MED ORDER — NALOXONE HCL 0.4 MG/ML IJ SOLN
INTRAMUSCULAR | Status: AC
  Filled 2017-06-22: qty 1

## 2017-06-22 MED ORDER — LEVOFLOXACIN 750 MG PO TABS: 750.0000 mg | ORAL_TABLET | Freq: Every day | ORAL | 0 refills | Status: DC

## 2017-06-22 MED ORDER — FENTANYL CITRATE (PF) 100 MCG/2ML IJ SOLN
INTRAMUSCULAR | Status: AC
  Filled 2017-06-22: qty 4

## 2017-06-22 MED ORDER — MIDAZOLAM HCL 2 MG/2ML IJ SOLN
INTRAMUSCULAR | Status: AC
  Filled 2017-06-22: qty 4

## 2017-06-22 NOTE — Discharge Instructions (Signed)
Moderate Conscious Sedation, Adult, Care After °These instructions provide you with information about caring for yourself after your procedure. Your health care provider may also give you more specific instructions. Your treatment has been planned according to current medical practices, but problems sometimes occur. Call your health care provider if you have any problems or questions after your procedure. °What can I expect after the procedure? °After your procedure, it is common: °· To feel sleepy for several hours. °· To feel clumsy and have poor balance for several hours. °· To have poor judgment for several hours. °· To vomit if you eat too soon. ° °Follow these instructions at home: °For at least 24 hours after the procedure: ° °· Do not: °? Participate in activities where you could fall or become injured. °? Drive. °? Use heavy machinery. °? Drink alcohol. °? Take sleeping pills or medicines that cause drowsiness. °? Make important decisions or sign legal documents. °? Take care of children on your own. °· Rest. °Eating and drinking °· Follow the diet recommended by your health care provider. °· If you vomit: °? Drink water, juice, or soup when you can drink without vomiting. °? Make sure you have little or no nausea before eating solid foods. °General instructions °· Have a responsible adult stay with you until you are awake and alert. °· Take over-the-counter and prescription medicines only as told by your health care provider. °· If you smoke, do not smoke without supervision. °· Keep all follow-up visits as told by your health care provider. This is important. °Contact a health care provider if: °· You keep feeling nauseous or you keep vomiting. °· You feel light-headed. °· You develop a rash. °· You have a fever. °Get help right away if: °· You have trouble breathing. °This information is not intended to replace advice given to you by your health care provider. Make sure you discuss any questions you have  with your health care provider. °Document Released: 02/09/2013 Document Revised: 09/24/2015 Document Reviewed: 08/11/2015 °Elsevier Interactive Patient Education © 2018 Elsevier Inc[employer] ° ° °Bone Marrow Aspiration and Bone Marrow Biopsy, Adult, Care After °This sheet gives you information about how to care for yourself after your procedure. Your health care provider may also give you more specific instructions. If you have problems or questions, contact your health care provider. °What can I expect after the procedure? °After the procedure, it is common to have: °· Mild pain and tenderness. °· Swelling. °· Bruising. ° °Follow these instructions at home: °· Take over-the-counter or prescription medicines only as told by your health care provider. °· Do not take baths, swim, or use a hot tub until your health care provider approves. Ask if you can take a shower or have a sponge bath.  You may shower tomorrow. °· Follow instructions from your health care provider about how to take care of the puncture site. Make sure you: °? Wash your hands with soap and water before you change your bandage (dressing). If soap and water are not available, use hand sanitizer. °? Change your dressing as told by your health care provider.  You may remove your dressing tomorrow. °· Check your puncture site every day for signs of infection. Check for: °? More redness, swelling, or pain. °? More fluid or blood. °? Warmth. °? Pus or a bad smell. °· Return to your normal activities as told by your health care provider. Ask your health care provider what activities are safe for you. °· Do not drive   for 24 hours if you were given a medicine to help you relax (sedative). °· Keep all follow-up visits as told by your health care provider. This is important. °Contact a health care provider if: °· You have more redness, swelling, or pain around the puncture site. °· You have more fluid or blood coming from the puncture site. °· Your puncture site feels  warm to the touch. °· You have pus or a bad smell coming from the puncture site. °· You have a fever. °· Your pain is not controlled with medicine. °This information is not intended to replace advice given to you by your health care provider. Make sure you discuss any questions you have with your health care provider. °Document Released: 11/08/2004 Document Revised: 11/09/2015 Document Reviewed: 10/03/2015 °Elsevier Interactive Patient Education © 2018 Elsevier Inc. ° °

## 2017-06-22 NOTE — Procedures (Signed)
Interventional Radiology Procedure Note  Procedure: CT guided aspirate and core biopsy of right iliac bone Complications: None Recommendations: - Bedrest supine x 1 hrs - Hydrocodone PRN  Pain - Follow biopsy results  Signed,  Makylah Bossard K. Laurey Salser, MD   

## 2017-06-22 NOTE — H&P (Signed)
Referring Physician(s): Brunetta Genera  Supervising Physician: Jacqulynn Cadet  Patient Status:  [facility] OP  Chief Complaint:  "I'm having a bone marrow biopsy"  Subjective: Patient familiar to IR service from prior Port-A-Cath placement on 02/17/17.  He has a prior history of melanoma and now with high-grade peripheral T-cell lymphoma, status post induction chemotherapy.  Request now received for CT-guided bone marrow biopsy prior to transplant evaluation.  He currently denies fever, headache, chest pain, dyspnea, abdominal pain, nausea, vomiting or bleeding.  He does have intermittent low back pain and occasional cough. Past Medical History:  Diagnosis Date  . Allergic rhinitis   . Asthma    slight  . Bell's palsy   . GERD (gastroesophageal reflux disease)   . Gout   . Hypercholesteremia   . Melanoma ([place])   . Osteoarthritis   . Sleep apnea    Resolving  . Thrombocytopenia ([place])   . Tubular adenoma    Past Surgical History:  Procedure Laterality Date  . IR FLUORO GUIDE PORT INSERTION RIGHT  02/17/2017  . IR US GUIDE VASC ACCESS RIGHT  02/17/2017  . KNEE SURGERY  1993  . MELANOMA EXCISION    . NASAL RECONSTRUCTION    . VASECTOMY  1999     Allergies: Penicillins  Medications: Prior to Admission medications   Medication Sig Start Date End Date Taking? Authorizing Provider  acyclovir (ZOVIRAX) 400 MG tablet Take 1 tablet (400 mg total) by mouth 2 (two) times daily. 06/11/17  Yes Brunetta Genera, MD  albuterol (PROVENTIL HFA;VENTOLIN HFA) 108 (90 Base) MCG/ACT inhaler Inhale 1-2 puffs into the lungs every 6 (six) hours as needed for wheezing or shortness of breath.   Yes [provider]  carvedilol (COREG) 3.125 MG tablet Take 1 tablet (3.125 mg total) by mouth 2 (two) times daily with a meal. 05/26/17  Yes Larey Dresser, MD  desloratadine (CLARINEX) 5 MG tablet Take 5 mg by mouth daily.   Yes [provider]  dronabinol (MARINOL) 5 MG  capsule Take 1 capsule (5 mg total) by mouth 2 (two) times daily before a meal. 05/28/17  Yes Curcio, Roselie Awkward, NP  fluconazole (DIFLUCAN) 200 MG tablet Take 1 tablet (200 mg total) by mouth daily. Hold 24h prior to chemotherapy 04/20/17  Yes Causey, Charlestine Massed, NP  lidocaine-prilocaine (EMLA) cream Apply to affected area once 06/01/17  Yes Owens Shark, NP  magic mouthwash w/lidocaine SOLN Swish and Spit 5 ml three times daily as needed for mouth pain. 06/11/17  Yes Brunetta Genera, MD  mometasone-formoterol [facility]) 100-5 MCG/ACT AERO Inhale 2 puffs into the lungs 2 (two) times daily. 01/27/17  Yes Tanda Rockers, MD  pantoprazole (PROTONIX) 40 MG tablet Take 40 mg by mouth daily.    Yes [provider]  pravastatin (PRAVACHOL) 40 MG tablet Take 40 mg by mouth daily.   Yes [provider]  predniSONE (DELTASONE) 20 MG tablet Take 3 tablets (60 mg total) by mouth daily. Take on days 1-5 of chemotherapy. 02/13/17  Yes Brunetta Genera, MD  traZODone (DESYREL) 50 MG tablet Take 1-2 tablets (50-100 mg total) by mouth at bedtime. 04/20/17  Yes Causey, Charlestine Massed, NP  chlorpheniramine (CHLOR-TRIMETON) 4 MG tablet Take 4 mg every 4 (four) hours as needed by mouth for allergies.    [provider]  levofloxacin (LEVAQUIN) 500 MG tablet Take 1 tablet (500 mg total) by mouth daily. 05/25/17   Brunetta Genera, MD  Vital Signs: Blood pressure 120/81, heart rate 94, temp 98.7, respirations 18, O2 sat 100% room air    Physical Exam awake, alert.  Chest clear to auscultation bilaterally.  Clean, intact right chest wall Port-A-Cath.  Heart with regular rate and rhythm.  Abdomen soft, positive bowel sounds, nontender.  No lower extremity edema.  Imaging: Nm Pet Image Restag (ps) Skull Base To Thigh  Result Date: 06/19/2017 CLINICAL DATA:  Subsequent treatment strategy for peripheral T/NK cell lymphoma, post induction chemotherapy, pre transplant  evaluation. EXAM: NUCLEAR MEDICINE PET SKULL BASE TO THIGH TECHNIQUE: 8.5 mCi F-18 FDG was injected intravenously. Full-ring PET imaging was performed from the skull base to thigh after the radiotracer. CT data was obtained and used for attenuation correction and anatomic localization. FASTING BLOOD GLUCOSE:  Value: 135 mg/dl COMPARISON:  Multiple exams, including 04/30/2017 FINDINGS: NECK No hypermetabolic lymph nodes in the neck. Mildly asymmetric glottic uptake but without CT correlate, probably physiologic. Mild chronic right maxillary sinusitis. Bilateral mild carotid atherosclerotic calcification. CHEST No hypermetabolic mediastinal or hilar nodes. 5.0 by 3.2 cm airspace opacity in the left lower lobe on image 55/8, associated maximum SUV 6.0 New faint peripheral nodularity anteriorly in the right middle lobe measuring up to 2.1 by 0.8 cm, maximum SUV in this vicinity 1.6. New bandlike peripheral 1.0 by 2.3 cm posterior basal segment right lower lobe opacity with maximum SUV 2.2. Right Port-A-Cath tip: Right atrium. Reduced size of the pericardial effusion. Coronary, aortic arch, and branch vessel atherosclerotic vascular disease. ABDOMEN/PELVIS No abnormal hypermetabolic activity within the liver, pancreas, or adrenal glands. Splenic activity is similar to the level of activity in the liver or only very minimally greater, demonstrating less of a difference than on the prior exam. Mild rind of density along the abdominal aorta represents effectively treated prior lymphoma, no associated accentuated metabolic activity at this time. No pathologic adenopathy. Physiologic activity in bowel and stomach. The spleen measures 15.0 by 5.3 by 11.2 cm (volume = 470 cm^3). Aortoiliac atherosclerotic vascular disease. Nonobstructive right nephrolithiasis with several 1-2 mm right mid kidney calculi and a 3 mm right kidney lower pole calculus. Punctate calcifications in the prostate gland. The right testicle appears to be  somewhat proximally retracted along the spermatic cord. SKELETON Diffuse and somewhat heterogeneous skeletal uptake is still accentuated but not as much as on the prior exam. For example a midthoracic vertebra previously had a maximum SUV of 7.1 and currently this level has maximum SUV of 5.3. The sternum has a maximum SUV of 4.4, formerly 7.0. IMPRESSION: 1. New airspace opacity in the left lower lobe has accentuated metabolic activity with maximum SUV 6.0. Differential diagnostic considerations include pneumonia and lymphomatous infiltrate. Low-grade activity with more faint densities in the right middle lobe and right lower lobe. 2. Mild splenomegaly, but splenic activity relative to the liver is closer to normal today. 3. The diffuse but somewhat heterogeneous bony activity shown on the prior exam is improved, but still remains elevated. Although possibly due to granulocyte stimulation, widespread marrow involvement is not readily excluded. 4. Reduction in volume of the pericardial effusion shown on the prior exam. 5. No activity in the small rind of density along the abdominal aorta representing effectively treated lymphoma. 6. Other imaging findings of potential clinical significance: Mild chronic right maxillary sinusitis. Aortic Atherosclerosis (ICD10-I70.0). Coronary atherosclerosis. Nonobstructive right nephrolithiasis. Mild right testicle proximal retraction. Electronically Signed   By: Van Clines M.D.   On: 06/19/2017 11:21    Labs:  CBC: Recent  Labs    04/20/17 0906 04/21/17 0834 05/01/17 0815  06/01/17 0942 06/08/17 1057 06/08/17 1544 06/15/17 0801  WBC 14.4* 16.5* 16.1*   < > 15.6* 2.7* 2.0* 17.6*  HGB 7.3* 8.0* 8.6*  --  8.7*  --   --   --   HCT 22.8* 25.9* 27.6*   < > 27.7* 23.8* 20.6* 27.3*  PLT 93* 109* 30*   < > 64* 9* 35* 50*   < > = values in this interval not displayed.    COAGS: Recent Labs    02/17/17 0930 06/22/17 0712  INR 1.09 1.05    BMP: Recent  Labs    01/20/17 1138  05/01/17 0815 05/11/17 1006 06/01/17 0942 06/15/17 0801  NA 138   < > 137 140 141 140  K 3.9   < > 3.6 4.3 4.1 3.8  CL 99*  --   --  104 106 104  CO2 28   < > '26 27 27 26  '$ GLUCOSE 138*   < > 223* 187* 137 167*  BUN 12   < > 5.6* 8 7 6*  CALCIUM 9.3   < > 8.8 9.4 9.0 8.8  CREATININE 0.76   < > 0.7 0.72 0.71 0.80  GFRNONAA >60  --   --  >60 >60 >60  GFRAA >60  --   --  >60 >60 >60   < > = values in this interval not displayed.    LIVER FUNCTION TESTS: Recent Labs    05/01/17 0815 05/11/17 1006 06/01/17 0942 06/15/17 0801  BILITOT 0.40 0.5 0.4 0.5  AST '24 16 18 20  '$ ALT '25 18 13 14  '$ ALKPHOS 256* 266* 201* 225*  PROT 6.1* 6.2* 5.8* 5.9*  ALBUMIN 3.4* 3.4* 3.4* 3.7    Assessment and Plan:  Pt with prior history of melanoma and now with high-grade peripheral T-cell lymphoma, status post induction chemotherapy.  Request now received for CT-guided bone marrow biopsy prior to transplant evaluation.Risks and benefits discussed with the patient/spouse including, but not limited to bleeding, infection, damage to adjacent structures or low yield requiring additional tests.  All of the patient's questions were answered, patient is agreeable to proceed. Consent signed and in chart.     Electronically Signed: D. Rowe Robert, PA-C 06/22/2017, 8:35 AM   I spent a total of 20 minutes at the the patient's bedside AND on the patient's hospital floor or unit, greater than 50% of which was counseling/coordinating care for CT-guided bone marrow biopsy

## 2017-06-22 NOTE — Sedation Documentation (Signed)
Patient is resting comfortably. 

## 2017-06-23 ENCOUNTER — Encounter: Payer: Self-pay | Admitting: Internal Medicine

## 2017-06-23 ENCOUNTER — Ambulatory Visit: Payer: BLUE CROSS/BLUE SHIELD | Admitting: Internal Medicine

## 2017-06-23 VITALS — BP 118/68 | HR 112 | Ht 71.0 in | Wt 166.8 lb

## 2017-06-23 DIAGNOSIS — J45991 Cough variant asthma: Secondary | ICD-10-CM | POA: Diagnosis not present

## 2017-06-23 DIAGNOSIS — R918 Other nonspecific abnormal finding of lung field: Secondary | ICD-10-CM

## 2017-06-23 DIAGNOSIS — C8590 Non-Hodgkin lymphoma, unspecified, unspecified site: Secondary | ICD-10-CM | POA: Diagnosis not present

## 2017-06-23 DIAGNOSIS — C8448 Peripheral T-cell lymphoma, not classified, lymph nodes of multiple sites: Secondary | ICD-10-CM | POA: Diagnosis not present

## 2017-06-23 DIAGNOSIS — Z01818 Encounter for other preprocedural examination: Secondary | ICD-10-CM | POA: Diagnosis not present

## 2017-06-23 MED ORDER — BUDESONIDE-FORMOTEROL FUMARATE 80-4.5 MCG/ACT IN AERO: 2.0000 | INHALATION_SPRAY | Freq: Two times a day (BID) | RESPIRATORY_TRACT | 0 refills | Status: DC

## 2017-06-23 NOTE — Patient Instructions (Addendum)
Finish levaquin and then rec  repeat CT sinus and Chest to see what if any further treatment is needed or if lung bx is required prior to stem cell transplant   symbicort 80 = dulera 100  Take 2 puffs first thing in am and then another 2 puffs about 12 hours later.   Work on inhaler technique:  relax and gently blow all the way out then take a nice smooth deep breath back in, triggering the inhaler at same time you start breathing in.  Hold for up to 5 seconds if you can. Blow out thru nose. Rinse and gargle with water when done      For cough > mucinex dm 1200 mg every 12 hours as needed  Make sure protonix is Take 30- 60 min before your first and last meals of the day   GERD (REFLUX)  is an extremely common cause of respiratory symptoms just like yours , many times with no obvious heartburn at all.    It can be treated with medication, but also with lifestyle changes including elevation of the head of your bed (ideally with 6 inch  bed blocks),  Smoking cessation, avoidance of late meals, excessive alcohol, and avoid fatty foods, chocolate, peppermint, colas, red wine, and acidic juices such as orange juice.  NO MINT OR MENTHOL PRODUCTS SO NO COUGH DROPS   USE SUGARLESS CANDY INSTEAD (Jolley ranchers or Stover's or Life Savers) or even ice chips will also do - the key is to swallow to prevent all throat clearing. NO OIL BASED VITAMINS - use powdered substitutes.

## 2017-06-23 NOTE — Progress Notes (Signed)
Subjective:     Patient ID: ICARUS PARTCH, male   DOB: Apr 19, 1956     MRN: [id number]    Brief patient profile:  18 yowm never smoker never problems with allergies/ asthma onset of pnds seasonally in 40s better on clarinex but eventually started needed year round but even then  gradually worse sense of pnds then daily cough x 2015 referred to pulmonary clinic 01/27/2017 by Dr   Hulan Fess with recently detected widespread adenopathy c/w T cell lymphoma.     History of Present Illness  01/27/2017 1st [facility] office visit/ Jakota Manthei   Chief Complaint  Patient presents with  . Pulmonary Consult    Referred by Dr. Hulan Fess. Pt c/o cough off and on for the past 3 years. He is not producing any sputum at this point- just finished pred taper and round of abx. He states that when he tries to take in a deep breath it triggers him to cough.  He has an albuterol inhaler that he typically uses 3 x per month.   chronic pnds x decades then worse cough esp spring and fall much better with prednisone which tapered off one day prior to OV  But s excess/ purulent sputum or mucus plugs   ppi seemed to help some with neg EGD 12/05/16 and continuing to take ppi but not always 30 m ac  Not limited by breathing from desired activities but senses can't take a deep breath s provoking cough which is worse with ex   ? Some better with saba but rarely uses  rec Continue protonix 40 mg Take 30-60 min before first meal of the day  GERD   Dulera 100 Take 2 puffs first thing in am and then another 2 puffs about 12 hours later.  Work on inhaler technique:   If still can't stop coughing > tessalon 200 mg  Up to three times daily as needed  For drainage / throat tickle try instead of clarinex take CHLORPHENIRAMINE  4 mg - take one every 4 hours as needed - available over the counter- may cause drowsiness so start with just a bedtime dose or two and see how you tolerate it before trying in daytime    03/10/2017   f/u ov/Orhan Mayorga re:  Cough variant asthma improved on dulera 100 2bid  Chief Complaint  Patient presents with  . Follow-up    Cough is "99% better". Still clearing his throat often.    no problem with noct cough  Exercise > sob = MMRC1 = can walk nl pace, flat grade, can't hurry or go uphills or steps s sob   No additional changes in resp symptoms  when on pred for chemo for lymphoma x one week cycles  rec Plan A = Automatic = dulera 100 Take 2 puffs first thing in am and then another 2 puffs about 12 hours later.  Plan B = Backup Only use your albuterol as a rescue medication Try to suppress cough with either hard rock candy or Tessalon to prevent throat clearing  For drainage / throat tickle try take CHLORPHENIRAMINE  4 mg - take one every 4 hours as needed    Last ov with oncology 06/15/17 "CURRENT THERAPY: R-CHOEP s/p 6 cycles"  Last rx 06/03/17     06/23/2017   Acute office consultation/Pasqual Farias re: worse cough and PET abnormalities  Chief Complaint  Patient presents with  . Pulmonary Consult    Referred back by [name] for eval of  abnormal PET scan. Pt states his cough has been worse over the past wk- prod with clear sputum.  His breathing is unchangd since last visit. He is using his albuterol inhaler 4-5 x per wk on average.   Dyspnea = baseline = MMRC1 = can walk nl pace, flat grade, can't hurry or go uphills or steps s sob   Cough: worse after supper esp x one week but only minimal mucoid sputum   Sleep: ok flat no 02  [facility] Started on levaquin  06/22/17 no change yet      No obvious day to day or daytime variability or assoc  purulent sputum or mucus plugs or hemoptysis or cp or chest tightness, subjective wheeze or overt sinus or hb symptoms. No unusual exposure hx or h/o childhood pna/ asthma or knowledge of premature birth.  Sleeping ok flat without nocturnal  or early am exacerbation  of respiratory  c/o's or need for noct saba. Also denies any  obvious fluctuation of symptoms with weather or environmental changes or other aggravating or alleviating factors except as outlined above   Current Allergies, Complete Past Medical History, Past Surgical History, Family History, and Social History were reviewed in [employer] record.  ROS  The following are not active complaints unless bolded Hoarseness, sore throat, dysphagia, dental problems, itching, sneezing,  nasal congestion or discharge of excess mucus or purulent secretions, ear ache,   fever, chills, sweats, unintended wt loss or wt gain, classically pleuritic or exertional cp,  orthopnea pnd or leg swelling, presyncope, palpitations, abdominal pain, anorexia, nausea, vomiting, diarrhea  or change in bowel habits or change in bladder habits, change in stools or change in urine, dysuria, hematuria,  rash, arthralgias, visual complaints, headache, numbness, weakness or ataxia or problems with walking or coordination,  change in mood/affect or memory.        Current Meds  Medication Sig  . acyclovir (ZOVIRAX) 400 MG tablet Take 1 tablet (400 mg total) by mouth 2 (two) times daily.  Marland Kitchen albuterol (PROVENTIL HFA;VENTOLIN HFA) 108 (90 Base) MCG/ACT inhaler Inhale 1-2 puffs into the lungs every 6 (six) hours as needed for wheezing or shortness of breath.  . carvedilol (COREG) 3.125 MG tablet Take 1 tablet (3.125 mg total) by mouth 2 (two) times daily with a meal.  . chlorpheniramine (CHLOR-TRIMETON) 4 MG tablet Take 4 mg every 4 (four) hours as needed by mouth for allergies.  Marland Kitchen desloratadine (CLARINEX) 5 MG tablet Take 5 mg by mouth daily.  Marland Kitchen dronabinol (MARINOL) 5 MG capsule Take 1 capsule (5 mg total) by mouth 2 (two) times daily before a meal.  . fluconazole (DIFLUCAN) 200 MG tablet Take 1 tablet (200 mg total) by mouth daily. Hold 24h prior to chemotherapy  . levofloxacin (LEVAQUIN) 750 MG tablet Take 1 tablet (750 mg total) by mouth daily.  Marland Kitchen lidocaine-prilocaine (EMLA)  cream Apply to affected area once  . magic mouthwash w/lidocaine SOLN Swish and Spit 5 ml three times daily as needed for mouth pain.  . mometasone-formoterol (DULERA) 100-5 MCG/ACT AERO Inhale 2 puffs into the lungs 2 (two) times daily.  . pantoprazole (PROTONIX) 40 MG tablet Take 40 mg by mouth daily.   . pravastatin (PRAVACHOL) 40 MG tablet Take 40 mg by mouth daily.  . traZODone (DESYREL) 50 MG tablet Take 1-2 tablets (50-100 mg total) by mouth at bedtime.  Objective:   Physical Exam  amb wm nad  06/23/2017       166    01/27/17 184 lb (83.5 kg)  01/26/17 183 lb 11.2 oz (83.3 kg)  01/20/17 182 lb (82.6 kg)     Vital signs reviewed - Note on arrival 02 sats  98% on RA      HEENT: nl dentition, turbinates bilaterally, and oropharynx. Nl external ear canals without cough reflex   NECK :  without JVD/Nodes/TM/ nl carotid upstrokes bilaterally   LUNGS: no acc muscle use,  Nl contour chest  with scattered inspiratory and expiratory rhonchi L > R  and a few expiratory wheezes as well.  CV:  RRR  no s3 or murmur or increase in P2, and no edema   ABD:  soft and nontender with nl inspiratory excursion in the supine position. No bruits or organomegaly appreciated, bowel sounds nl  MS:  Nl gait/ ext warm without deformities, calf tenderness, cyanosis or clubbing No obvious joint restrictions   SKIN: warm and dry without lesions    NEURO:  alert, approp, nl sensorium with  no motor or cerebellar deficits apparent.      PET  06/19/17 . New airspace opacity in the left lower lobe has accentuated metabolic activity with maximum SUV 6.0. Differential diagnostic considerations include pneumonia and lymphomatous infiltrate. Low-grade activity with more faint densities in the right middle lobe and right lower lobe. 2. Mild splenomegaly, but splenic activity relative to the liver is closer to normal today. 3. The diffuse but somewhat heterogeneous bony  activity shown on the prior exam is improved, but still remains elevated. Although possibly due to granulocyte stimulation, widespread marrow involvement is not readily excluded. 4. Reduction in volume of the pericardial effusion shown on the prior exam. 5. No activity in the small rind of density along the abdominal aorta representing effectively treated lymphoma. 6. Other imaging findings of potential clinical significance: Mild chronic right maxillary sinusitis. Aortic Atherosclerosis (ICD10-I70.0). Coronary atherosclerosis. Nonobstructive right nephrolithiasis. Mild right testicle proximal retraction.      Assessment:

## 2017-06-24 ENCOUNTER — Encounter: Payer: Self-pay | Admitting: Internal Medicine

## 2017-06-24 DIAGNOSIS — C8448 Peripheral T-cell lymphoma, not classified, lymph nodes of multiple sites: Secondary | ICD-10-CM | POA: Diagnosis not present

## 2017-06-24 DIAGNOSIS — I340 Nonrheumatic mitral (valve) insufficiency: Secondary | ICD-10-CM | POA: Diagnosis not present

## 2017-06-24 DIAGNOSIS — Z01818 Encounter for other preprocedural examination: Secondary | ICD-10-CM | POA: Diagnosis not present

## 2017-06-24 DIAGNOSIS — R918 Other nonspecific abnormal finding of lung field: Secondary | ICD-10-CM | POA: Insufficient documentation

## 2017-06-24 DIAGNOSIS — Z01810 Encounter for preprocedural cardiovascular examination: Secondary | ICD-10-CM | POA: Diagnosis not present

## 2017-06-24 NOTE — Assessment & Plan Note (Addendum)
See PET 06/19/17  - levaquin 750 qd 06/22/17 - 07/02/17   ddx  Miscellaneous:Alv microlithiasis, alv proteinosis, asp, bronchiectais, BOOP   ARDS/ AIP Occupational dz/ HSP Neoplasm - seems less likely given localized to LLL  Infection- HCAP > rx levaquin high dose x 10 days approp with f/u ct here or at [facility] prior to stem cell transplant Drug - seems unlikely given asym and he has not had RT as part of rx to date Pulmonary emboli, Protein disorders Edema/Eosinophilic dz Sarcoidosis Connective tissue dz Hist X / Hemorrhage Idiopathic    Discussed in detail all the  indications, usual  risks and alternatives  relative to the benefits with patient who agrees to proceed with w/u as outlined and if not better likely needs lung bx prior to stem cell transplant but I would prefer the team minutes considering the transplant make the final call about what they need to be confident this is not an infection and would defer this therefore to [facility] unless otherwise requested.     I had an extended discussion with the patient reviewing all relevant studies completed to date and  lasting 25 minutes of a 40  minute consultation  visit     re    non-specific but potentially very serious refractory respiratory symptoms of uncertain and potentially multiple  etiologies.   Each maintenance medication was reviewed in detail including most importantly the difference between maintenance and prns and under what circumstances the prns are to be triggered using an action plan format that is not reflected in the computer generated alphabetically organized AVS.    Please see AVS for specific instructions unique to this office visit that I personally wrote and verbalized to the the pt in detail and then reviewed with pt  by my nurse highlighting any changes in therapy/plan of care  recommended at today's visit.

## 2017-06-24 NOTE — Assessment & Plan Note (Addendum)
CT abd 01/21/17 with nonspecific and very mild tree in bud pattern 01/27/2017  After extensive coaching HFA effectiveness =    90% > try duler 100 2bid  - Spirometry 03/10/2017  FEV1 3.29 (89%)  Ratio 86 p am Dulera 100 x 2 pffs  - 03/10/2017  Walked RA x 3 laps @ 185 ft each stopped due to  End of study, fast pace, no sob or desat     - 06/23/2017  After extensive coaching inhaler device  effectiveness =    75% > continue symb 80 or dulera 100 2bid  I think he has due to her problems here one involving the airways and the other the lung parenchyma. The airway problem my hope is just low-grade asthmatic bronchitis which is best treated with a combination of Mucinex products and low doses of inhaled steroids/laba.  Would be cautious with use of coreg here especially  in higher doses.  - In the setting of respiratory symptoms of unknown etiology,  It would be preferable to use bystolic, the most beta -1  selective Beta blocker available in sample form, with bisoprolol the most selective generic choice  on the market, at least on a trial basis, to make sure the spillover Beta 2 effects of the less specific Beta blockers are not contributing to this patient's symptoms.     See pulmonary infiltrates a/p

## 2017-06-25 DIAGNOSIS — C8448 Peripheral T-cell lymphoma, not classified, lymph nodes of multiple sites: Secondary | ICD-10-CM | POA: Diagnosis not present

## 2017-06-25 DIAGNOSIS — Z01818 Encounter for other preprocedural examination: Secondary | ICD-10-CM | POA: Diagnosis not present

## 2017-06-26 ENCOUNTER — Telehealth: Payer: Self-pay

## 2017-06-26 NOTE — Telephone Encounter (Signed)
Fax sent to ATTN: Rebecca, RN at [facility] with results from patient's recent bone marrow biopsy. Confirmed fax receipt at [phone] on 06/26/17 at 1616. Results required in order to proceed with BMT. 

## 2017-06-30 ENCOUNTER — Ambulatory Visit: Payer: BLUE CROSS/BLUE SHIELD | Admitting: Internal Medicine

## 2017-07-02 DIAGNOSIS — C8448 Peripheral T-cell lymphoma, not classified, lymph nodes of multiple sites: Secondary | ICD-10-CM | POA: Diagnosis not present

## 2017-07-02 DIAGNOSIS — Z01818 Encounter for other preprocedural examination: Secondary | ICD-10-CM | POA: Diagnosis not present

## 2017-07-08 ENCOUNTER — Other Ambulatory Visit: Payer: Self-pay | Admitting: *Deleted

## 2017-07-08 DIAGNOSIS — C8448 Peripheral T-cell lymphoma, not classified, lymph nodes of multiple sites: Secondary | ICD-10-CM | POA: Diagnosis not present

## 2017-07-08 DIAGNOSIS — J9811 Atelectasis: Secondary | ICD-10-CM | POA: Diagnosis not present

## 2017-07-08 DIAGNOSIS — R911 Solitary pulmonary nodule: Secondary | ICD-10-CM | POA: Diagnosis not present

## 2017-07-08 MED ORDER — ACYCLOVIR 400 MG PO TABS: 400.0000 mg | ORAL_TABLET | Freq: Two times a day (BID) | ORAL | 0 refills | Status: DC

## 2017-07-10 ENCOUNTER — Encounter (HOSPITAL_COMMUNITY): Payer: Self-pay | Admitting: Hematology

## 2017-07-10 LAB — CHROMOSOME ANALYSIS, BONE MARROW

## 2017-07-18 DIAGNOSIS — D6959 Other secondary thrombocytopenia: Secondary | ICD-10-CM | POA: Diagnosis not present

## 2017-07-18 DIAGNOSIS — D6481 Anemia due to antineoplastic chemotherapy: Secondary | ICD-10-CM | POA: Diagnosis not present

## 2017-07-18 DIAGNOSIS — I10 Essential (primary) hypertension: Secondary | ICD-10-CM | POA: Diagnosis not present

## 2017-07-18 DIAGNOSIS — D649 Anemia, unspecified: Secondary | ICD-10-CM | POA: Diagnosis not present

## 2017-07-18 DIAGNOSIS — Z79891 Long term (current) use of opiate analgesic: Secondary | ICD-10-CM | POA: Diagnosis not present

## 2017-07-18 DIAGNOSIS — K1230 Oral mucositis (ulcerative), unspecified: Secondary | ICD-10-CM | POA: Diagnosis not present

## 2017-07-18 DIAGNOSIS — E785 Hyperlipidemia, unspecified: Secondary | ICD-10-CM | POA: Diagnosis not present

## 2017-07-18 DIAGNOSIS — R5383 Other fatigue: Secondary | ICD-10-CM | POA: Diagnosis not present

## 2017-07-18 DIAGNOSIS — D630 Anemia in neoplastic disease: Secondary | ICD-10-CM | POA: Diagnosis not present

## 2017-07-18 DIAGNOSIS — Z5112 Encounter for antineoplastic immunotherapy: Secondary | ICD-10-CM | POA: Diagnosis not present

## 2017-07-18 DIAGNOSIS — D61810 Antineoplastic chemotherapy induced pancytopenia: Secondary | ICD-10-CM | POA: Diagnosis not present

## 2017-07-18 DIAGNOSIS — B9710 Unspecified enterovirus as the cause of diseases classified elsewhere: Secondary | ICD-10-CM | POA: Diagnosis not present

## 2017-07-18 DIAGNOSIS — C8448 Peripheral T-cell lymphoma, not classified, lymph nodes of multiple sites: Secondary | ICD-10-CM | POA: Diagnosis not present

## 2017-07-18 DIAGNOSIS — R942 Abnormal results of pulmonary function studies: Secondary | ICD-10-CM | POA: Diagnosis not present

## 2017-07-18 DIAGNOSIS — T451X5S Adverse effect of antineoplastic and immunosuppressive drugs, sequela: Secondary | ICD-10-CM | POA: Diagnosis not present

## 2017-07-18 DIAGNOSIS — R112 Nausea with vomiting, unspecified: Secondary | ICD-10-CM | POA: Diagnosis not present

## 2017-07-18 DIAGNOSIS — I959 Hypotension, unspecified: Secondary | ICD-10-CM | POA: Diagnosis not present

## 2017-07-18 DIAGNOSIS — Z7984 Long term (current) use of oral hypoglycemic drugs: Secondary | ICD-10-CM | POA: Diagnosis not present

## 2017-07-18 DIAGNOSIS — Z9484 Stem cells transplant status: Secondary | ICD-10-CM | POA: Diagnosis not present

## 2017-07-18 DIAGNOSIS — Z9889 Other specified postprocedural states: Secondary | ICD-10-CM | POA: Diagnosis not present

## 2017-07-18 DIAGNOSIS — Z9221 Personal history of antineoplastic chemotherapy: Secondary | ICD-10-CM | POA: Diagnosis not present

## 2017-07-18 DIAGNOSIS — Z7902 Long term (current) use of antithrombotics/antiplatelets: Secondary | ICD-10-CM | POA: Diagnosis not present

## 2017-07-18 DIAGNOSIS — Z5111 Encounter for antineoplastic chemotherapy: Secondary | ICD-10-CM | POA: Diagnosis not present

## 2017-07-18 DIAGNOSIS — E860 Dehydration: Secondary | ICD-10-CM | POA: Diagnosis not present

## 2017-07-18 DIAGNOSIS — R05 Cough: Secondary | ICD-10-CM | POA: Diagnosis not present

## 2017-07-18 DIAGNOSIS — Z79899 Other long term (current) drug therapy: Secondary | ICD-10-CM | POA: Diagnosis not present

## 2017-07-18 DIAGNOSIS — K219 Gastro-esophageal reflux disease without esophagitis: Secondary | ICD-10-CM | POA: Diagnosis not present

## 2017-07-18 DIAGNOSIS — C8590 Non-Hodgkin lymphoma, unspecified, unspecified site: Secondary | ICD-10-CM | POA: Diagnosis not present

## 2017-07-18 DIAGNOSIS — D696 Thrombocytopenia, unspecified: Secondary | ICD-10-CM | POA: Diagnosis not present

## 2017-07-18 DIAGNOSIS — T454X5S Adverse effect of iron and its compounds, sequela: Secondary | ICD-10-CM | POA: Diagnosis not present

## 2017-07-18 DIAGNOSIS — R748 Abnormal levels of other serum enzymes: Secondary | ICD-10-CM | POA: Diagnosis not present

## 2017-07-18 DIAGNOSIS — C8440 Peripheral T-cell lymphoma, not classified, unspecified site: Secondary | ICD-10-CM | POA: Diagnosis not present

## 2017-07-18 DIAGNOSIS — T451X5A Adverse effect of antineoplastic and immunosuppressive drugs, initial encounter: Secondary | ICD-10-CM | POA: Diagnosis not present

## 2017-07-18 DIAGNOSIS — E119 Type 2 diabetes mellitus without complications: Secondary | ICD-10-CM | POA: Diagnosis not present

## 2017-07-18 DIAGNOSIS — Z52011 Autologous donor, stem cells: Secondary | ICD-10-CM | POA: Diagnosis not present

## 2017-07-18 DIAGNOSIS — R197 Diarrhea, unspecified: Secondary | ICD-10-CM | POA: Diagnosis not present

## 2017-07-18 DIAGNOSIS — K1231 Oral mucositis (ulcerative) due to antineoplastic therapy: Secondary | ICD-10-CM | POA: Diagnosis not present

## 2017-07-18 DIAGNOSIS — Z452 Encounter for adjustment and management of vascular access device: Secondary | ICD-10-CM | POA: Diagnosis not present

## 2017-07-18 DIAGNOSIS — Z880 Allergy status to penicillin: Secondary | ICD-10-CM | POA: Diagnosis not present

## 2017-07-21 DIAGNOSIS — Z5112 Encounter for antineoplastic immunotherapy: Secondary | ICD-10-CM | POA: Diagnosis not present

## 2017-07-21 DIAGNOSIS — Z52011 Autologous donor, stem cells: Secondary | ICD-10-CM | POA: Diagnosis not present

## 2017-07-21 DIAGNOSIS — C8448 Peripheral T-cell lymphoma, not classified, lymph nodes of multiple sites: Secondary | ICD-10-CM | POA: Diagnosis not present

## 2017-07-22 DIAGNOSIS — C8448 Peripheral T-cell lymphoma, not classified, lymph nodes of multiple sites: Secondary | ICD-10-CM | POA: Diagnosis not present

## 2017-07-29 ENCOUNTER — Other Ambulatory Visit: Payer: Self-pay | Admitting: Adult Health

## 2017-07-29 DIAGNOSIS — C8448 Peripheral T-cell lymphoma, not classified, lymph nodes of multiple sites: Secondary | ICD-10-CM | POA: Diagnosis not present

## 2017-07-29 DIAGNOSIS — C8498 Mature T/NK-cell lymphomas, unspecified, lymph nodes of multiple sites: Secondary | ICD-10-CM

## 2017-07-30 DIAGNOSIS — C8448 Peripheral T-cell lymphoma, not classified, lymph nodes of multiple sites: Secondary | ICD-10-CM | POA: Diagnosis not present

## 2017-07-30 DIAGNOSIS — I10 Essential (primary) hypertension: Secondary | ICD-10-CM | POA: Diagnosis not present

## 2017-07-30 DIAGNOSIS — Z7984 Long term (current) use of oral hypoglycemic drugs: Secondary | ICD-10-CM | POA: Diagnosis not present

## 2017-07-30 DIAGNOSIS — E119 Type 2 diabetes mellitus without complications: Secondary | ICD-10-CM | POA: Diagnosis not present

## 2017-07-30 NOTE — Telephone Encounter (Signed)
He is currently under active transplant related cares and would defer medication mx to them currently since he will have a lot of medication turnover that we would nto be able to track. thx GK

## 2017-07-30 NOTE — Telephone Encounter (Signed)
This is your patient I put on Trazodone for sleep while he was undergoing chemotherapy.  Will you refill or forward to his PCP at your discretion?  Thanks, [place]

## 2017-07-31 ENCOUNTER — Telehealth: Payer: Self-pay

## 2017-07-31 DIAGNOSIS — E119 Type 2 diabetes mellitus without complications: Secondary | ICD-10-CM | POA: Diagnosis not present

## 2017-07-31 DIAGNOSIS — R112 Nausea with vomiting, unspecified: Secondary | ICD-10-CM | POA: Diagnosis not present

## 2017-07-31 DIAGNOSIS — Z7902 Long term (current) use of antithrombotics/antiplatelets: Secondary | ICD-10-CM | POA: Diagnosis not present

## 2017-07-31 DIAGNOSIS — Z880 Allergy status to penicillin: Secondary | ICD-10-CM | POA: Diagnosis not present

## 2017-07-31 DIAGNOSIS — I10 Essential (primary) hypertension: Secondary | ICD-10-CM | POA: Diagnosis not present

## 2017-07-31 DIAGNOSIS — E785 Hyperlipidemia, unspecified: Secondary | ICD-10-CM | POA: Diagnosis not present

## 2017-07-31 DIAGNOSIS — D6959 Other secondary thrombocytopenia: Secondary | ICD-10-CM | POA: Diagnosis not present

## 2017-07-31 DIAGNOSIS — D6481 Anemia due to antineoplastic chemotherapy: Secondary | ICD-10-CM | POA: Diagnosis not present

## 2017-07-31 DIAGNOSIS — C8448 Peripheral T-cell lymphoma, not classified, lymph nodes of multiple sites: Secondary | ICD-10-CM | POA: Diagnosis not present

## 2017-07-31 DIAGNOSIS — K219 Gastro-esophageal reflux disease without esophagitis: Secondary | ICD-10-CM | POA: Diagnosis not present

## 2017-07-31 DIAGNOSIS — T451X5A Adverse effect of antineoplastic and immunosuppressive drugs, initial encounter: Secondary | ICD-10-CM | POA: Diagnosis not present

## 2017-07-31 NOTE — Telephone Encounter (Signed)
Pt called requesting refill for trazadone. At this time, pt is being seen at [facility] for transplant. Per [name], [facility] physician should be the one to manage medications to protect against contraindications and adherence. Pt verbalized understanding and plan to contact transplant physician.

## 2017-08-01 DIAGNOSIS — Z7984 Long term (current) use of oral hypoglycemic drugs: Secondary | ICD-10-CM | POA: Diagnosis not present

## 2017-08-01 DIAGNOSIS — C8448 Peripheral T-cell lymphoma, not classified, lymph nodes of multiple sites: Secondary | ICD-10-CM | POA: Diagnosis not present

## 2017-08-01 DIAGNOSIS — T451X5A Adverse effect of antineoplastic and immunosuppressive drugs, initial encounter: Secondary | ICD-10-CM | POA: Diagnosis not present

## 2017-08-01 DIAGNOSIS — R112 Nausea with vomiting, unspecified: Secondary | ICD-10-CM | POA: Diagnosis not present

## 2017-08-01 DIAGNOSIS — D630 Anemia in neoplastic disease: Secondary | ICD-10-CM | POA: Diagnosis not present

## 2017-08-01 DIAGNOSIS — E119 Type 2 diabetes mellitus without complications: Secondary | ICD-10-CM | POA: Diagnosis not present

## 2017-08-01 DIAGNOSIS — I10 Essential (primary) hypertension: Secondary | ICD-10-CM | POA: Diagnosis not present

## 2017-08-01 DIAGNOSIS — E785 Hyperlipidemia, unspecified: Secondary | ICD-10-CM | POA: Diagnosis not present

## 2017-08-01 DIAGNOSIS — K219 Gastro-esophageal reflux disease without esophagitis: Secondary | ICD-10-CM | POA: Diagnosis not present

## 2017-08-01 DIAGNOSIS — D696 Thrombocytopenia, unspecified: Secondary | ICD-10-CM | POA: Diagnosis not present

## 2017-08-01 DIAGNOSIS — Z79899 Other long term (current) drug therapy: Secondary | ICD-10-CM | POA: Diagnosis not present

## 2017-08-01 DIAGNOSIS — D6481 Anemia due to antineoplastic chemotherapy: Secondary | ICD-10-CM | POA: Diagnosis not present

## 2017-08-01 DIAGNOSIS — D649 Anemia, unspecified: Secondary | ICD-10-CM | POA: Diagnosis not present

## 2017-08-01 DIAGNOSIS — Z880 Allergy status to penicillin: Secondary | ICD-10-CM | POA: Diagnosis not present

## 2017-08-02 DIAGNOSIS — Z5111 Encounter for antineoplastic chemotherapy: Secondary | ICD-10-CM | POA: Diagnosis not present

## 2017-08-02 DIAGNOSIS — C8448 Peripheral T-cell lymphoma, not classified, lymph nodes of multiple sites: Secondary | ICD-10-CM | POA: Diagnosis not present

## 2017-08-02 DIAGNOSIS — D630 Anemia in neoplastic disease: Secondary | ICD-10-CM | POA: Diagnosis not present

## 2017-08-02 DIAGNOSIS — D6481 Anemia due to antineoplastic chemotherapy: Secondary | ICD-10-CM | POA: Diagnosis not present

## 2017-08-02 DIAGNOSIS — D696 Thrombocytopenia, unspecified: Secondary | ICD-10-CM | POA: Diagnosis not present

## 2017-08-03 DIAGNOSIS — C8448 Peripheral T-cell lymphoma, not classified, lymph nodes of multiple sites: Secondary | ICD-10-CM | POA: Diagnosis not present

## 2017-08-03 DIAGNOSIS — D61810 Antineoplastic chemotherapy induced pancytopenia: Secondary | ICD-10-CM | POA: Diagnosis not present

## 2017-08-03 DIAGNOSIS — I10 Essential (primary) hypertension: Secondary | ICD-10-CM | POA: Diagnosis not present

## 2017-08-03 DIAGNOSIS — R942 Abnormal results of pulmonary function studies: Secondary | ICD-10-CM | POA: Diagnosis not present

## 2017-08-03 DIAGNOSIS — T454X5S Adverse effect of iron and its compounds, sequela: Secondary | ICD-10-CM | POA: Diagnosis not present

## 2017-08-03 DIAGNOSIS — E119 Type 2 diabetes mellitus without complications: Secondary | ICD-10-CM | POA: Diagnosis not present

## 2017-08-03 DIAGNOSIS — E785 Hyperlipidemia, unspecified: Secondary | ICD-10-CM | POA: Diagnosis not present

## 2017-08-03 DIAGNOSIS — K219 Gastro-esophageal reflux disease without esophagitis: Secondary | ICD-10-CM | POA: Diagnosis not present

## 2017-08-03 DIAGNOSIS — Z79899 Other long term (current) drug therapy: Secondary | ICD-10-CM | POA: Diagnosis not present

## 2017-08-03 DIAGNOSIS — Z7984 Long term (current) use of oral hypoglycemic drugs: Secondary | ICD-10-CM | POA: Diagnosis not present

## 2017-08-04 DIAGNOSIS — E119 Type 2 diabetes mellitus without complications: Secondary | ICD-10-CM | POA: Diagnosis not present

## 2017-08-04 DIAGNOSIS — D6959 Other secondary thrombocytopenia: Secondary | ICD-10-CM | POA: Diagnosis not present

## 2017-08-04 DIAGNOSIS — C8448 Peripheral T-cell lymphoma, not classified, lymph nodes of multiple sites: Secondary | ICD-10-CM | POA: Diagnosis not present

## 2017-08-04 DIAGNOSIS — Z9221 Personal history of antineoplastic chemotherapy: Secondary | ICD-10-CM | POA: Diagnosis not present

## 2017-08-04 DIAGNOSIS — K219 Gastro-esophageal reflux disease without esophagitis: Secondary | ICD-10-CM | POA: Diagnosis not present

## 2017-08-04 DIAGNOSIS — Z7984 Long term (current) use of oral hypoglycemic drugs: Secondary | ICD-10-CM | POA: Diagnosis not present

## 2017-08-04 DIAGNOSIS — I10 Essential (primary) hypertension: Secondary | ICD-10-CM | POA: Diagnosis not present

## 2017-08-04 DIAGNOSIS — Z79899 Other long term (current) drug therapy: Secondary | ICD-10-CM | POA: Diagnosis not present

## 2017-08-04 DIAGNOSIS — Z9484 Stem cells transplant status: Secondary | ICD-10-CM | POA: Diagnosis not present

## 2017-08-04 DIAGNOSIS — R942 Abnormal results of pulmonary function studies: Secondary | ICD-10-CM | POA: Diagnosis not present

## 2017-08-04 DIAGNOSIS — E785 Hyperlipidemia, unspecified: Secondary | ICD-10-CM | POA: Diagnosis not present

## 2017-08-04 DIAGNOSIS — D6481 Anemia due to antineoplastic chemotherapy: Secondary | ICD-10-CM | POA: Diagnosis not present

## 2017-08-05 DIAGNOSIS — D61810 Antineoplastic chemotherapy induced pancytopenia: Secondary | ICD-10-CM | POA: Diagnosis not present

## 2017-08-05 DIAGNOSIS — C8448 Peripheral T-cell lymphoma, not classified, lymph nodes of multiple sites: Secondary | ICD-10-CM | POA: Diagnosis not present

## 2017-08-05 DIAGNOSIS — T451X5S Adverse effect of antineoplastic and immunosuppressive drugs, sequela: Secondary | ICD-10-CM | POA: Diagnosis not present

## 2017-08-06 DIAGNOSIS — E785 Hyperlipidemia, unspecified: Secondary | ICD-10-CM | POA: Diagnosis not present

## 2017-08-06 DIAGNOSIS — C8448 Peripheral T-cell lymphoma, not classified, lymph nodes of multiple sites: Secondary | ICD-10-CM | POA: Diagnosis not present

## 2017-08-06 DIAGNOSIS — Z7984 Long term (current) use of oral hypoglycemic drugs: Secondary | ICD-10-CM | POA: Diagnosis not present

## 2017-08-06 DIAGNOSIS — Z9484 Stem cells transplant status: Secondary | ICD-10-CM | POA: Diagnosis not present

## 2017-08-06 DIAGNOSIS — T451X5S Adverse effect of antineoplastic and immunosuppressive drugs, sequela: Secondary | ICD-10-CM | POA: Diagnosis not present

## 2017-08-06 DIAGNOSIS — Z452 Encounter for adjustment and management of vascular access device: Secondary | ICD-10-CM | POA: Diagnosis not present

## 2017-08-06 DIAGNOSIS — D61810 Antineoplastic chemotherapy induced pancytopenia: Secondary | ICD-10-CM | POA: Diagnosis not present

## 2017-08-06 DIAGNOSIS — E119 Type 2 diabetes mellitus without complications: Secondary | ICD-10-CM | POA: Diagnosis not present

## 2017-08-06 DIAGNOSIS — I10 Essential (primary) hypertension: Secondary | ICD-10-CM | POA: Diagnosis not present

## 2017-08-06 DIAGNOSIS — K219 Gastro-esophageal reflux disease without esophagitis: Secondary | ICD-10-CM | POA: Diagnosis not present

## 2017-08-06 DIAGNOSIS — R197 Diarrhea, unspecified: Secondary | ICD-10-CM | POA: Diagnosis not present

## 2017-08-06 DIAGNOSIS — D6481 Anemia due to antineoplastic chemotherapy: Secondary | ICD-10-CM | POA: Diagnosis not present

## 2017-08-06 DIAGNOSIS — R748 Abnormal levels of other serum enzymes: Secondary | ICD-10-CM | POA: Diagnosis not present

## 2017-08-06 DIAGNOSIS — R942 Abnormal results of pulmonary function studies: Secondary | ICD-10-CM | POA: Diagnosis not present

## 2017-08-07 DIAGNOSIS — T451X5S Adverse effect of antineoplastic and immunosuppressive drugs, sequela: Secondary | ICD-10-CM | POA: Diagnosis not present

## 2017-08-07 DIAGNOSIS — E785 Hyperlipidemia, unspecified: Secondary | ICD-10-CM | POA: Diagnosis not present

## 2017-08-07 DIAGNOSIS — D6481 Anemia due to antineoplastic chemotherapy: Secondary | ICD-10-CM | POA: Diagnosis not present

## 2017-08-07 DIAGNOSIS — I10 Essential (primary) hypertension: Secondary | ICD-10-CM | POA: Diagnosis not present

## 2017-08-07 DIAGNOSIS — K219 Gastro-esophageal reflux disease without esophagitis: Secondary | ICD-10-CM | POA: Diagnosis not present

## 2017-08-07 DIAGNOSIS — E119 Type 2 diabetes mellitus without complications: Secondary | ICD-10-CM | POA: Diagnosis not present

## 2017-08-07 DIAGNOSIS — R197 Diarrhea, unspecified: Secondary | ICD-10-CM | POA: Diagnosis not present

## 2017-08-07 DIAGNOSIS — R942 Abnormal results of pulmonary function studies: Secondary | ICD-10-CM | POA: Diagnosis not present

## 2017-08-07 DIAGNOSIS — C8448 Peripheral T-cell lymphoma, not classified, lymph nodes of multiple sites: Secondary | ICD-10-CM | POA: Diagnosis not present

## 2017-08-07 DIAGNOSIS — R748 Abnormal levels of other serum enzymes: Secondary | ICD-10-CM | POA: Diagnosis not present

## 2017-08-07 DIAGNOSIS — D61810 Antineoplastic chemotherapy induced pancytopenia: Secondary | ICD-10-CM | POA: Diagnosis not present

## 2017-08-08 DIAGNOSIS — C8448 Peripheral T-cell lymphoma, not classified, lymph nodes of multiple sites: Secondary | ICD-10-CM | POA: Diagnosis not present

## 2017-08-08 DIAGNOSIS — R197 Diarrhea, unspecified: Secondary | ICD-10-CM | POA: Diagnosis not present

## 2017-08-08 DIAGNOSIS — Z9889 Other specified postprocedural states: Secondary | ICD-10-CM | POA: Diagnosis not present

## 2017-08-08 DIAGNOSIS — D61810 Antineoplastic chemotherapy induced pancytopenia: Secondary | ICD-10-CM | POA: Diagnosis not present

## 2017-08-08 DIAGNOSIS — T451X5S Adverse effect of antineoplastic and immunosuppressive drugs, sequela: Secondary | ICD-10-CM | POA: Diagnosis not present

## 2017-08-09 DIAGNOSIS — C8448 Peripheral T-cell lymphoma, not classified, lymph nodes of multiple sites: Secondary | ICD-10-CM | POA: Diagnosis not present

## 2017-08-09 DIAGNOSIS — R5383 Other fatigue: Secondary | ICD-10-CM | POA: Diagnosis not present

## 2017-08-09 DIAGNOSIS — R197 Diarrhea, unspecified: Secondary | ICD-10-CM | POA: Diagnosis not present

## 2017-08-09 DIAGNOSIS — Z9484 Stem cells transplant status: Secondary | ICD-10-CM | POA: Diagnosis not present

## 2017-08-10 DIAGNOSIS — Z52011 Autologous donor, stem cells: Secondary | ICD-10-CM | POA: Diagnosis not present

## 2017-08-10 DIAGNOSIS — C8448 Peripheral T-cell lymphoma, not classified, lymph nodes of multiple sites: Secondary | ICD-10-CM | POA: Diagnosis not present

## 2017-08-11 DIAGNOSIS — Z52011 Autologous donor, stem cells: Secondary | ICD-10-CM | POA: Diagnosis not present

## 2017-08-11 DIAGNOSIS — C8448 Peripheral T-cell lymphoma, not classified, lymph nodes of multiple sites: Secondary | ICD-10-CM | POA: Diagnosis not present

## 2017-08-12 DIAGNOSIS — C8590 Non-Hodgkin lymphoma, unspecified, unspecified site: Secondary | ICD-10-CM | POA: Diagnosis not present

## 2017-08-12 DIAGNOSIS — R05 Cough: Secondary | ICD-10-CM | POA: Diagnosis not present

## 2017-08-13 DIAGNOSIS — C8448 Peripheral T-cell lymphoma, not classified, lymph nodes of multiple sites: Secondary | ICD-10-CM | POA: Diagnosis not present

## 2017-08-13 DIAGNOSIS — Z9484 Stem cells transplant status: Secondary | ICD-10-CM | POA: Diagnosis not present

## 2017-08-13 DIAGNOSIS — E119 Type 2 diabetes mellitus without complications: Secondary | ICD-10-CM | POA: Diagnosis not present

## 2017-08-13 DIAGNOSIS — K1231 Oral mucositis (ulcerative) due to antineoplastic therapy: Secondary | ICD-10-CM | POA: Diagnosis not present

## 2017-08-14 DIAGNOSIS — C8448 Peripheral T-cell lymphoma, not classified, lymph nodes of multiple sites: Secondary | ICD-10-CM | POA: Diagnosis not present

## 2017-08-14 DIAGNOSIS — D61810 Antineoplastic chemotherapy induced pancytopenia: Secondary | ICD-10-CM | POA: Diagnosis not present

## 2017-08-14 DIAGNOSIS — K1231 Oral mucositis (ulcerative) due to antineoplastic therapy: Secondary | ICD-10-CM | POA: Diagnosis not present

## 2017-08-14 DIAGNOSIS — Z9484 Stem cells transplant status: Secondary | ICD-10-CM | POA: Diagnosis not present

## 2017-08-15 DIAGNOSIS — D61810 Antineoplastic chemotherapy induced pancytopenia: Secondary | ICD-10-CM | POA: Diagnosis not present

## 2017-08-15 DIAGNOSIS — T451X5S Adverse effect of antineoplastic and immunosuppressive drugs, sequela: Secondary | ICD-10-CM | POA: Diagnosis not present

## 2017-08-15 DIAGNOSIS — Z9484 Stem cells transplant status: Secondary | ICD-10-CM | POA: Diagnosis not present

## 2017-08-15 DIAGNOSIS — C8448 Peripheral T-cell lymphoma, not classified, lymph nodes of multiple sites: Secondary | ICD-10-CM | POA: Diagnosis not present

## 2017-08-15 DIAGNOSIS — C8440 Peripheral T-cell lymphoma, not classified, unspecified site: Secondary | ICD-10-CM | POA: Diagnosis not present

## 2017-08-15 DIAGNOSIS — Z52011 Autologous donor, stem cells: Secondary | ICD-10-CM | POA: Diagnosis not present

## 2017-08-15 DIAGNOSIS — Z79899 Other long term (current) drug therapy: Secondary | ICD-10-CM | POA: Diagnosis not present

## 2017-08-16 DIAGNOSIS — C8448 Peripheral T-cell lymphoma, not classified, lymph nodes of multiple sites: Secondary | ICD-10-CM | POA: Diagnosis not present

## 2017-08-16 DIAGNOSIS — Z9484 Stem cells transplant status: Secondary | ICD-10-CM | POA: Diagnosis not present

## 2017-08-16 DIAGNOSIS — D61810 Antineoplastic chemotherapy induced pancytopenia: Secondary | ICD-10-CM | POA: Diagnosis not present

## 2017-08-16 DIAGNOSIS — C8440 Peripheral T-cell lymphoma, not classified, unspecified site: Secondary | ICD-10-CM | POA: Diagnosis not present

## 2017-08-16 DIAGNOSIS — T451X5S Adverse effect of antineoplastic and immunosuppressive drugs, sequela: Secondary | ICD-10-CM | POA: Diagnosis not present

## 2017-08-17 DIAGNOSIS — Z9484 Stem cells transplant status: Secondary | ICD-10-CM | POA: Diagnosis not present

## 2017-08-17 DIAGNOSIS — Z7984 Long term (current) use of oral hypoglycemic drugs: Secondary | ICD-10-CM | POA: Diagnosis not present

## 2017-08-17 DIAGNOSIS — E119 Type 2 diabetes mellitus without complications: Secondary | ICD-10-CM | POA: Diagnosis not present

## 2017-08-17 DIAGNOSIS — B9710 Unspecified enterovirus as the cause of diseases classified elsewhere: Secondary | ICD-10-CM | POA: Diagnosis not present

## 2017-08-17 DIAGNOSIS — C8448 Peripheral T-cell lymphoma, not classified, lymph nodes of multiple sites: Secondary | ICD-10-CM | POA: Diagnosis not present

## 2017-08-17 DIAGNOSIS — D6481 Anemia due to antineoplastic chemotherapy: Secondary | ICD-10-CM | POA: Diagnosis not present

## 2017-08-17 DIAGNOSIS — Z79891 Long term (current) use of opiate analgesic: Secondary | ICD-10-CM | POA: Diagnosis not present

## 2017-08-17 DIAGNOSIS — D6959 Other secondary thrombocytopenia: Secondary | ICD-10-CM | POA: Diagnosis not present

## 2017-08-17 DIAGNOSIS — R05 Cough: Secondary | ICD-10-CM | POA: Diagnosis not present

## 2017-08-17 DIAGNOSIS — R197 Diarrhea, unspecified: Secondary | ICD-10-CM | POA: Diagnosis not present

## 2017-08-17 DIAGNOSIS — E785 Hyperlipidemia, unspecified: Secondary | ICD-10-CM | POA: Diagnosis not present

## 2017-08-17 DIAGNOSIS — T451X5S Adverse effect of antineoplastic and immunosuppressive drugs, sequela: Secondary | ICD-10-CM | POA: Diagnosis not present

## 2017-08-17 DIAGNOSIS — K219 Gastro-esophageal reflux disease without esophagitis: Secondary | ICD-10-CM | POA: Diagnosis not present

## 2017-08-17 DIAGNOSIS — I10 Essential (primary) hypertension: Secondary | ICD-10-CM | POA: Diagnosis not present

## 2017-08-17 DIAGNOSIS — K1230 Oral mucositis (ulcerative), unspecified: Secondary | ICD-10-CM | POA: Diagnosis not present

## 2017-08-17 DIAGNOSIS — R748 Abnormal levels of other serum enzymes: Secondary | ICD-10-CM | POA: Diagnosis not present

## 2017-08-18 ENCOUNTER — Telehealth: Payer: Self-pay | Admitting: *Deleted

## 2017-08-18 DIAGNOSIS — B348 Other viral infections of unspecified site: Secondary | ICD-10-CM | POA: Diagnosis not present

## 2017-08-18 DIAGNOSIS — R748 Abnormal levels of other serum enzymes: Secondary | ICD-10-CM | POA: Diagnosis not present

## 2017-08-18 DIAGNOSIS — C8448 Peripheral T-cell lymphoma, not classified, lymph nodes of multiple sites: Secondary | ICD-10-CM | POA: Diagnosis not present

## 2017-08-18 DIAGNOSIS — Z7984 Long term (current) use of oral hypoglycemic drugs: Secondary | ICD-10-CM | POA: Diagnosis not present

## 2017-08-18 DIAGNOSIS — R197 Diarrhea, unspecified: Secondary | ICD-10-CM | POA: Diagnosis not present

## 2017-08-18 DIAGNOSIS — T451X5S Adverse effect of antineoplastic and immunosuppressive drugs, sequela: Secondary | ICD-10-CM | POA: Diagnosis not present

## 2017-08-18 DIAGNOSIS — E119 Type 2 diabetes mellitus without complications: Secondary | ICD-10-CM | POA: Diagnosis not present

## 2017-08-18 DIAGNOSIS — I10 Essential (primary) hypertension: Secondary | ICD-10-CM | POA: Diagnosis not present

## 2017-08-18 DIAGNOSIS — K1230 Oral mucositis (ulcerative), unspecified: Secondary | ICD-10-CM | POA: Diagnosis not present

## 2017-08-18 DIAGNOSIS — Z9484 Stem cells transplant status: Secondary | ICD-10-CM | POA: Diagnosis not present

## 2017-08-18 DIAGNOSIS — K219 Gastro-esophageal reflux disease without esophagitis: Secondary | ICD-10-CM | POA: Diagnosis not present

## 2017-08-18 DIAGNOSIS — E785 Hyperlipidemia, unspecified: Secondary | ICD-10-CM | POA: Diagnosis not present

## 2017-08-18 DIAGNOSIS — D6481 Anemia due to antineoplastic chemotherapy: Secondary | ICD-10-CM | POA: Diagnosis not present

## 2017-08-18 DIAGNOSIS — D61810 Antineoplastic chemotherapy induced pancytopenia: Secondary | ICD-10-CM | POA: Diagnosis not present

## 2017-08-18 DIAGNOSIS — R942 Abnormal results of pulmonary function studies: Secondary | ICD-10-CM | POA: Diagnosis not present

## 2017-08-18 NOTE — Telephone Encounter (Signed)
Wells Guiles from [facility] team, left a voice mail stating, "the patient is completing his autologous stem cell transplant and needs a follow up with [name]. Return number is [phone]." Left a message on Rebecca's phone that I would give this message to [name]. Informed her that he was out of the office for two weeks.

## 2017-08-19 DIAGNOSIS — C8448 Peripheral T-cell lymphoma, not classified, lymph nodes of multiple sites: Secondary | ICD-10-CM | POA: Diagnosis not present

## 2017-08-19 DIAGNOSIS — B348 Other viral infections of unspecified site: Secondary | ICD-10-CM | POA: Diagnosis not present

## 2017-08-19 DIAGNOSIS — Z52011 Autologous donor, stem cells: Secondary | ICD-10-CM | POA: Diagnosis not present

## 2017-08-19 DIAGNOSIS — Z9484 Stem cells transplant status: Secondary | ICD-10-CM | POA: Diagnosis not present

## 2017-08-21 ENCOUNTER — Telehealth: Payer: Self-pay | Admitting: *Deleted

## 2017-08-21 ENCOUNTER — Other Ambulatory Visit: Payer: Self-pay | Admitting: *Deleted

## 2017-08-21 DIAGNOSIS — C8442 Peripheral T-cell lymphoma, not classified, intrathoracic lymph nodes: Secondary | ICD-10-CM

## 2017-08-21 DIAGNOSIS — Z9484 Stem cells transplant status: Secondary | ICD-10-CM | POA: Diagnosis not present

## 2017-08-21 DIAGNOSIS — C8448 Peripheral T-cell lymphoma, not classified, lymph nodes of multiple sites: Secondary | ICD-10-CM | POA: Diagnosis not present

## 2017-08-21 NOTE — Telephone Encounter (Signed)
Received call from [facility] with BMT at [facility] regarding pt need for lab follow up.  Pt to have lab apt 4/22, 4/25, & 4/29.  Preferable if pt can have apt with [name] prior to already scheduled 5/13 apt.  Will check with [name] upon return for availability.  Labs needed:  CBC, CMP, & Mag.  Labs ordered, and scheduling message placed.  Call Wells Guiles for questions/conerns:  [phone]

## 2017-08-24 ENCOUNTER — Telehealth: Payer: Self-pay | Admitting: Hematology

## 2017-08-24 ENCOUNTER — Telehealth: Payer: Self-pay

## 2017-08-24 ENCOUNTER — Inpatient Hospital Stay: Payer: BLUE CROSS/BLUE SHIELD | Attending: Hematology

## 2017-08-24 DIAGNOSIS — C8498 Mature T/NK-cell lymphomas, unspecified, lymph nodes of multiple sites: Secondary | ICD-10-CM | POA: Insufficient documentation

## 2017-08-24 DIAGNOSIS — D649 Anemia, unspecified: Secondary | ICD-10-CM | POA: Insufficient documentation

## 2017-08-24 DIAGNOSIS — C8442 Peripheral T-cell lymphoma, not classified, intrathoracic lymph nodes: Secondary | ICD-10-CM

## 2017-08-24 LAB — CMP (CANCER CENTER ONLY)
ALT: 30 U/L (ref 0–55)
AST: 37 U/L — ABNORMAL HIGH (ref 5–34)
Albumin: 3.3 g/dL — ABNORMAL LOW (ref 3.5–5.0)
Alkaline Phosphatase: 207 U/L — ABNORMAL HIGH (ref 40–150)
Anion gap: 11 (ref 3–11)
BILIRUBIN TOTAL: 0.6 mg/dL (ref 0.2–1.2)
BUN: 5 mg/dL — AB (ref 7–26)
CO2: 26 mmol/L (ref 22–29)
CREATININE: 0.81 mg/dL (ref 0.70–1.30)
Calcium: 9.2 mg/dL (ref 8.4–10.4)
Chloride: 102 mmol/L (ref 98–109)
GFR, Est AFR Am: >60 mL/min (ref >60)
Glucose, Bld: 115 mg/dL (ref 70–140)
Potassium: 4.2 mmol/L (ref 3.5–5.1)
Sodium: 139 mmol/L (ref 136–145)
TOTAL PROTEIN: 6 g/dL — AB (ref 6.4–8.3)

## 2017-08-24 LAB — CBC WITH DIFFERENTIAL (CANCER CENTER ONLY)
Basophils Absolute: 0.1 10*3/uL (ref 0.0–0.1)
Basophils Relative: 1 %
EOS PCT: 0 %
Eosinophils Absolute: 0 10*3/uL (ref 0.0–0.5)
HEMATOCRIT: 29.2 % — AB (ref 38.4–49.9)
Hemoglobin: 9.6 g/dL — ABNORMAL LOW (ref 13.0–17.1)
LYMPHS ABS: 0.9 10*3/uL (ref 0.9–3.3)
LYMPHS PCT: 8 %
MCH: 27.6 pg (ref 27.2–33.4)
MCHC: 33 g/dL (ref 32.0–36.0)
MCV: 83.8 fL (ref 79.3–98.0)
Monocytes Absolute: 2.1 10*3/uL — ABNORMAL HIGH (ref 0.1–0.9)
Monocytes Relative: 19 %
NEUTROS PCT: 72 %
Neutro Abs: 7.7 10*3/uL — ABNORMAL HIGH (ref 1.5–6.5)
Platelet Count: 33 10*3/uL — ABNORMAL LOW (ref 140–400)
RBC: 3.49 MIL/uL — AB (ref 4.20–5.82)
RDW: 18.3 % — ABNORMAL HIGH (ref 11.0–14.6)
WBC: 10.8 10*3/uL — AB (ref 4.0–10.3)

## 2017-08-24 LAB — MAGNESIUM: MAGNESIUM: 1.7 mg/dL (ref 1.7–2.4)

## 2017-08-24 NOTE — Telephone Encounter (Signed)
Called pt re setting up lab only appts for 4/22, 4/25 and 4/29. Pt's voicemail is full - could not leave voicemail for him to call back

## 2017-08-24 NOTE — Telephone Encounter (Signed)
Patient is aware of his currently scheduled appointments. Per 4/19 in basket request. He declined updated calender due to My Chart.

## 2017-08-25 ENCOUNTER — Telehealth: Payer: Self-pay | Admitting: *Deleted

## 2017-08-25 NOTE — Telephone Encounter (Signed)
Wife questioned status of labs.  Dr Lebron Conners reviewed.  Patient's wife [name] states they already got them but wanted to make sure a doctor had reviewed them.

## 2017-08-27 ENCOUNTER — Inpatient Hospital Stay: Payer: BLUE CROSS/BLUE SHIELD

## 2017-08-27 DIAGNOSIS — C8442 Peripheral T-cell lymphoma, not classified, intrathoracic lymph nodes: Secondary | ICD-10-CM

## 2017-08-27 DIAGNOSIS — C8498 Mature T/NK-cell lymphomas, unspecified, lymph nodes of multiple sites: Secondary | ICD-10-CM | POA: Diagnosis not present

## 2017-08-27 DIAGNOSIS — R7301 Impaired fasting glucose: Secondary | ICD-10-CM | POA: Diagnosis not present

## 2017-08-27 DIAGNOSIS — D649 Anemia, unspecified: Secondary | ICD-10-CM | POA: Diagnosis not present

## 2017-08-27 LAB — CBC WITH DIFFERENTIAL (CANCER CENTER ONLY)
Basophils Absolute: 0 10*3/uL (ref 0.0–0.1)
Basophils Relative: 0 %
EOS PCT: 0 %
Eosinophils Absolute: 0 10*3/uL (ref 0.0–0.5)
HCT: 29.1 % — ABNORMAL LOW (ref 38.4–49.9)
Hemoglobin: 9.4 g/dL — ABNORMAL LOW (ref 13.0–17.1)
LYMPHS PCT: 24 %
Lymphs Abs: 2.2 10*3/uL (ref 0.9–3.3)
MCH: 27.6 pg (ref 27.2–33.4)
MCHC: 32.3 g/dL (ref 32.0–36.0)
MCV: 85.6 fL (ref 79.3–98.0)
MONO ABS: 0.8 10*3/uL (ref 0.1–0.9)
MONOS PCT: 9 %
Neutro Abs: 5.8 10*3/uL (ref 1.5–6.5)
Neutrophils Relative %: 67 %
PLATELETS: 23 10*3/uL — AB (ref 140–400)
RBC: 3.4 MIL/uL — ABNORMAL LOW (ref 4.20–5.82)
RDW: 16.8 % — AB (ref 11.0–14.6)
WBC: 8.8 10*3/uL (ref 4.0–10.3)

## 2017-08-27 LAB — CMP (CANCER CENTER ONLY)
ALBUMIN: 3.6 g/dL (ref 3.5–5.0)
ALT: 26 U/L (ref 0–55)
AST: 33 U/L (ref 5–34)
Alkaline Phosphatase: 197 U/L — ABNORMAL HIGH (ref 40–150)
Anion gap: 9 (ref 3–11)
BILIRUBIN TOTAL: 0.6 mg/dL (ref 0.2–1.2)
BUN: 5 mg/dL — AB (ref 7–26)
CHLORIDE: 103 mmol/L (ref 98–109)
CO2: 27 mmol/L (ref 22–29)
CREATININE: 0.72 mg/dL (ref 0.70–1.30)
Calcium: 9.3 mg/dL (ref 8.4–10.4)
GFR, Est AFR Am: >60 mL/min (ref >60)
GFR, Estimated: >60 mL/min (ref >60)
GLUCOSE: 126 mg/dL (ref 70–140)
Potassium: 4.2 mmol/L (ref 3.5–5.1)
SODIUM: 139 mmol/L (ref 136–145)
Total Protein: 6.1 g/dL — ABNORMAL LOW (ref 6.4–8.3)

## 2017-08-27 LAB — MAGNESIUM: Magnesium: 1.8 mg/dL (ref 1.7–2.4)

## 2017-08-31 ENCOUNTER — Ambulatory Visit (HOSPITAL_COMMUNITY): Payer: BLUE CROSS/BLUE SHIELD | Admitting: Cardiology

## 2017-08-31 ENCOUNTER — Inpatient Hospital Stay: Payer: BLUE CROSS/BLUE SHIELD

## 2017-08-31 ENCOUNTER — Ambulatory Visit (HOSPITAL_COMMUNITY): Payer: BLUE CROSS/BLUE SHIELD

## 2017-08-31 DIAGNOSIS — C8442 Peripheral T-cell lymphoma, not classified, intrathoracic lymph nodes: Secondary | ICD-10-CM

## 2017-08-31 DIAGNOSIS — D649 Anemia, unspecified: Secondary | ICD-10-CM | POA: Diagnosis not present

## 2017-08-31 DIAGNOSIS — C8498 Mature T/NK-cell lymphomas, unspecified, lymph nodes of multiple sites: Secondary | ICD-10-CM | POA: Diagnosis not present

## 2017-08-31 LAB — CBC WITH DIFFERENTIAL (CANCER CENTER ONLY)
Basophils Absolute: 0 10*3/uL (ref 0.0–0.1)
Basophils Relative: 0 %
EOS ABS: 0 10*3/uL (ref 0.0–0.5)
Eosinophils Relative: 0 %
HCT: 30.5 % — ABNORMAL LOW (ref 38.4–49.9)
HEMOGLOBIN: 10 g/dL — AB (ref 13.0–17.1)
LYMPHS ABS: 2.4 10*3/uL (ref 0.9–3.3)
LYMPHS PCT: 31 %
MCH: 28.2 pg (ref 27.2–33.4)
MCHC: 32.8 g/dL (ref 32.0–36.0)
MCV: 85.9 fL (ref 79.3–98.0)
MONOS PCT: 5 %
Monocytes Absolute: 0.4 10*3/uL (ref 0.1–0.9)
NEUTROS PCT: 64 %
Neutro Abs: 4.9 10*3/uL (ref 1.5–6.5)
Platelet Count: 19 10*3/uL — ABNORMAL LOW (ref 140–400)
RBC: 3.55 MIL/uL — ABNORMAL LOW (ref 4.20–5.82)
RDW: 17.8 % — ABNORMAL HIGH (ref 11.0–14.6)
WBC Count: 7.7 10*3/uL (ref 4.0–10.3)

## 2017-08-31 LAB — CMP (CANCER CENTER ONLY)
ALBUMIN: 3.7 g/dL (ref 3.5–5.0)
ALK PHOS: 177 U/L — AB (ref 40–150)
ALT: 19 U/L (ref 0–55)
ANION GAP: 7 (ref 3–11)
AST: 20 U/L (ref 5–34)
BUN: 6 mg/dL — ABNORMAL LOW (ref 7–26)
CHLORIDE: 104 mmol/L (ref 98–109)
CO2: 27 mmol/L (ref 22–29)
CREATININE: 0.73 mg/dL (ref 0.70–1.30)
Calcium: 9.5 mg/dL (ref 8.4–10.4)
GFR, Est AFR Am: >60 mL/min (ref >60)
GFR, Estimated: >60 mL/min (ref >60)
GLUCOSE: 117 mg/dL (ref 70–140)
Potassium: 4.4 mmol/L (ref 3.5–5.1)
SODIUM: 138 mmol/L (ref 136–145)
Total Bilirubin: 0.6 mg/dL (ref 0.2–1.2)
Total Protein: 6.1 g/dL — ABNORMAL LOW (ref 6.4–8.3)

## 2017-08-31 LAB — MAGNESIUM: MAGNESIUM: 1.8 mg/dL (ref 1.7–2.4)

## 2017-09-01 NOTE — Telephone Encounter (Signed)
Joseph Powell, We can schedule him for f/u with labs CBC/diff/ret, cmp, LDH in abotu 2 weeks. thx GK

## 2017-09-02 ENCOUNTER — Other Ambulatory Visit: Payer: Self-pay

## 2017-09-02 ENCOUNTER — Telehealth: Payer: Self-pay

## 2017-09-02 DIAGNOSIS — C8442 Peripheral T-cell lymphoma, not classified, intrathoracic lymph nodes: Secondary | ICD-10-CM

## 2017-09-02 DIAGNOSIS — C8590 Non-Hodgkin lymphoma, unspecified, unspecified site: Secondary | ICD-10-CM | POA: Diagnosis not present

## 2017-09-02 NOTE — Telephone Encounter (Signed)
Pt called to confirm that there wasn't already an appt scheduled tomorrow with our office. Returned call to pt and there was confusion with [facility] appt date. Pt scheduled to come in on 09/14/17 for lab and doctor visit. Confirmed availability based on date and time. Pt grateful for the communication.

## 2017-09-02 NOTE — Telephone Encounter (Signed)
Per Wells Guiles with [facility], pt in need of f/u with [name] post transplant. MD would like to see pt in two weeks with labs. Lab orders placed and Wells Guiles notified [phone]. Pt called and LVM notifying him to expect a call from scheduling. Scheduling message sent.

## 2017-09-03 DIAGNOSIS — E785 Hyperlipidemia, unspecified: Secondary | ICD-10-CM | POA: Diagnosis not present

## 2017-09-03 DIAGNOSIS — C8448 Peripheral T-cell lymphoma, not classified, lymph nodes of multiple sites: Secondary | ICD-10-CM | POA: Diagnosis not present

## 2017-09-03 DIAGNOSIS — E119 Type 2 diabetes mellitus without complications: Secondary | ICD-10-CM | POA: Diagnosis not present

## 2017-09-03 DIAGNOSIS — Z79899 Other long term (current) drug therapy: Secondary | ICD-10-CM | POA: Diagnosis not present

## 2017-09-03 DIAGNOSIS — I10 Essential (primary) hypertension: Secondary | ICD-10-CM | POA: Diagnosis not present

## 2017-09-03 DIAGNOSIS — Z9484 Stem cells transplant status: Secondary | ICD-10-CM | POA: Diagnosis not present

## 2017-09-03 DIAGNOSIS — K219 Gastro-esophageal reflux disease without esophagitis: Secondary | ICD-10-CM | POA: Diagnosis not present

## 2017-09-07 ENCOUNTER — Other Ambulatory Visit: Payer: Self-pay | Admitting: *Deleted

## 2017-09-07 ENCOUNTER — Ambulatory Visit: Payer: BLUE CROSS/BLUE SHIELD | Admitting: Internal Medicine

## 2017-09-07 DIAGNOSIS — C8498 Mature T/NK-cell lymphomas, unspecified, lymph nodes of multiple sites: Secondary | ICD-10-CM

## 2017-09-07 MED ORDER — TRAZODONE HCL 50 MG PO TABS: 50.0000 mg | ORAL_TABLET | Freq: Every day | ORAL | 2 refills | Status: DC

## 2017-09-10 NOTE — Progress Notes (Signed)
HEMATOLOGY/ONCOLOGY CLINIC NOTE  Date of Service: 09/14/17  Patient Care Team: Hulan Fess, MD as PCP - General (Family Medicine)  CHIEF COMPLAINTS/PURPOSE OF CONSULTATION:   F/u for high gradeT cell NHL  CURRENT THERAPY: post BMT active surveillance  Previous Treatment; R-CHOEP x 6 cycles BEAM -Auto HSCT  HISTORY OF PRESENTING ILLNESS:  See previous note for details  INTERVAL HISTORY   Joseph Powell is a wonderful 62 y.o. male here for his scheduled followup after his transplant on 08/05/17 The patient's last visit with [place] was on 06/15/17. He is accompanied today by his wife. The pt reports that he is doing well overall.   The pt reports that he is beginning to feel better each day after his transfusion. He notes that he had mucositis and rhino virus infection in the weeks immediately after his transplant and has been taking Acyclovir and bactrim. He has stopped on pravastatin and fluconazole, and metformin.   The pt also notes that he has been eating better but his taste is slowly returning.   Of note since the patient's last visit, pt has had a Cytogenetics study completed on 06/22/17 with results revealing no clonal chromosomal abnormalities.  On 06/22/17 the pt also had a Flow Cytometry which revealed - PREDOMINANCE OF T LYMPHOCYTES WITH RELATIVE ABUNDANCE OF CD8 POSITIVE CELLS.   On 06/22/17 the pt also had a BM Bx pathology study which revealed - HYPERCELLULAR BONE MARROW FOR AGE WITH TRILINEAGE HEMATOPOIESIS. A FEW SMALL LYMPHOID AGGREGATES PRESENT.   Lab results today (09/14/17) of CBC, CMP, and Reticulocytes is as follows: all values are WNL except for WBC at 11.2k, RBC at 3.37, Hgb at 9.7, HCT at 29.6, RDW at 21.2, Platelets at 59k, ANC at 8.6k, Lymphs Abs at 0.8k, Monocytes Abs at 1.7k, Retic Ct Pct at 1.9%, Glucose at 216, Total Protein at 6.3, Alk Phos at 201. LDH 09/14/17 is WNL at 180.   On 09/03/17 PLT were at 22k.   On review of systems, pt reports resolved  mucositis, increased food intake, some nausea, and denies fevers, current concerns for infection, abdominal pains, back pains, and any other symptoms.    MEDICAL HISTORY:  Past Medical History:  Diagnosis Date  . Allergic rhinitis   . Asthma    slight  . Bell's palsy   . GERD (gastroesophageal reflux disease)   . Gout   . Hypercholesteremia   . Melanoma ([place])   . Osteoarthritis   . Sleep apnea    Resolving  . Thrombocytopenia ([place])   . Tubular adenoma     SURGICAL HISTORY: Past Surgical History:  Procedure Laterality Date  . IR FLUORO GUIDE PORT INSERTION RIGHT  02/17/2017  . IR US GUIDE VASC ACCESS RIGHT  02/17/2017  . KNEE SURGERY  1993  . MELANOMA EXCISION    . NASAL RECONSTRUCTION    . VASECTOMY  1999    SOCIAL HISTORY: Social History   Socioeconomic History  . Marital status: Married    Spouse name: Not on file  . Number of children: Not on file  . Years of education: Not on file  . Highest education level: Not on file  Occupational History  . Occupation: Risk analyst  Social Needs  . Financial resource strain: Not on file  . Food insecurity:    Worry: Not on file    Inability: Not on file  . Transportation needs:    Medical: Not on file    Non-medical: Not on file  Tobacco Use  . Smoking status: Never Smoker  . Smokeless tobacco: Never Used  Substance and Sexual Activity  . Alcohol use: Yes    Alcohol/week: 1.0 oz    Types: 2 Standard drinks or equivalent per week    Comment: socially  . Drug use: No  . Sexual activity: Not on file  Lifestyle  . Physical activity:    Days per week: Not on file    Minutes per session: Not on file  . Stress: Not on file  Relationships  . Social connections:    Talks on phone: Not on file    Gets together: Not on file    Attends religious service: Not on file    Active member of club or organization: Not on file    Attends meetings of clubs or organizations: Not on file    Relationship status: Not on file    . Intimate partner violence:    Fear of current or ex partner: Not on file    Emotionally abused: Not on file    Physically abused: Not on file    Forced sexual activity: Not on file  Other Topics Concern  . Not on file  Social History Narrative  . Not on file    FAMILY HISTORY: Family History  Problem Relation Age of Onset  . Diabetes Mother   . Asthma Mother   . Gout Brother   . Colon cancer Paternal Uncle   . Esophageal cancer Neg Hx   . Stomach cancer Neg Hx     ALLERGIES:  is allergic to penicillins.  MEDICATIONS:  Current Outpatient Medications  Medication Sig Dispense Refill  . acyclovir (ZOVIRAX) 400 MG tablet Take 1 tablet (400 mg total) by mouth 2 (two) times daily. 60 tablet 0  . albuterol (PROVENTIL HFA;VENTOLIN HFA) 108 (90 Base) MCG/ACT inhaler Inhale 1-2 puffs into the lungs every 6 (six) hours as needed for wheezing or shortness of breath.    . budesonide-formoterol (SYMBICORT) 80-4.5 MCG/ACT inhaler Inhale 2 puffs into the lungs 2 (two) times daily. 1 Inhaler 0  . carvedilol (COREG) 3.125 MG tablet TAKE 1 TABLET BY MOUTH TWICE DAILY WITH A MEAL 60 tablet 3  . chlorpheniramine (CHLOR-TRIMETON) 4 MG tablet Take 4 mg every 4 (four) hours as needed by mouth for allergies.    Marland Kitchen desloratadine (CLARINEX) 5 MG tablet Take 5 mg by mouth daily.    Marland Kitchen dronabinol (MARINOL) 5 MG capsule Take 1 capsule (5 mg total) by mouth 2 (two) times daily before a meal. 60 capsule 0  . fluconazole (DIFLUCAN) 200 MG tablet Take 1 tablet (200 mg total) by mouth daily. Hold 24h prior to chemotherapy 30 tablet 2  . levofloxacin (LEVAQUIN) 750 MG tablet Take 1 tablet (750 mg total) by mouth daily. 10 tablet 0  . lidocaine-prilocaine (EMLA) cream Apply to affected area once 30 g 3  . magic mouthwash w/lidocaine SOLN Swish and Spit 5 ml three times daily as needed for mouth pain. 120 mL 0  . mometasone-formoterol (DULERA) 100-5 MCG/ACT AERO Inhale 2 puffs into the lungs 2 (two) times daily. 1  Inhaler 11  . pantoprazole (PROTONIX) 40 MG tablet Take 40 mg by mouth daily.     . pravastatin (PRAVACHOL) 40 MG tablet Take 40 mg by mouth daily.    . traZODone (DESYREL) 50 MG tablet Take 1-2 tablets (50-100 mg total) by mouth at bedtime. 60 tablet 2   Current Facility-Administered Medications  Medication Dose Route Frequency Provider Last  Rate Last Dose  . 0.9 %  sodium chloride infusion  500 mL Intravenous Continuous Milus Banister, MD        REVIEW OF SYSTEMS:   A 10+ POINT REVIEW OF SYSTEMS WAS OBTAINED including neurology, dermatology, psychiatry, cardiac, respiratory, lymph, extremities, GI, GU, Musculoskeletal, constitutional, breasts, reproductive, HEENT.  All pertinent positives are noted in the HPI.  All others are negative.   PHYSICAL EXAMINATION: ECOG PERFORMANCE STATUS: 1 - Symptomatic but completely ambulatory  . Vitals:   09/14/17 0853  BP: 115/77  Pulse: 100  Resp: 17  Temp: 98.7 F (37.1 C)  SpO2: 100%   Filed Weights   09/14/17 0853  Weight: 158 lb (71.7 kg)   .Body mass index is 22.04 kg/m.  GENERAL:alert, in no acute distress and comfortable SKIN: no acute rashes, no significant lesions EYES: conjunctiva are pink and non-injected, sclera anicteric OROPHARYNX: MMM, no exudates, no oropharyngeal erythema or ulceration NECK: supple, no JVD LYMPH:  no palpable lymphadenopathy in the cervical, axillary or inguinal regions LUNGS: clear to auscultation b/l with normal respiratory effort HEART: regular rate & rhythm ABDOMEN:  normoactive bowel sounds , non tender, not distended.  Extremity: no pedal edema PSYCH: alert & oriented x 3 with fluent speech NEURO: no focal motor/sensory deficits   LABORATORY DATA:  I have reviewed the data as listed  . CBC Latest Ref Rng & Units 09/14/2017 08/31/2017 08/27/2017  WBC 4.0 - 10.3 K/uL 11.2(H) 7.7 8.8  Hemoglobin 13.0 - 17.1 g/dL 9.7(L) 10.0(L) 9.4(L)  Hematocrit 38.4 - 49.9 % 29.6(L) 30.5(L) 29.1(L)    Platelets 140 - 400 K/uL 59(L) 19(L) 23(L)  ANC 8.6k . CMP Latest Ref Rng & Units 09/14/2017 08/31/2017 08/27/2017  Glucose 70 - 140 mg/dL 216(H) 117 126  BUN 7 - 26 mg/dL 11 6(L) 5(L)  Creatinine 0.70 - 1.30 mg/dL 0.78 0.73 0.72  Sodium 136 - 145 mmol/L 140 138 139  Potassium 3.5 - 5.1 mmol/L 3.8 4.4 4.2  Chloride 98 - 109 mmol/L 105 104 103  CO2 22 - 29 mmol/L _0 Calcium 8.4 - 10.4 mg/dL 9.6 9.5 9.3  Total Protein 6.4 - 8.3 g/dL 6.3(L) 6.1(L) 6.1(L)  Total Bilirubin 0.2 - 1.2 mg/dL 0.4 0.6 0.6  Alkaline Phos 40 - 150 U/L 201(H) 177(H) 197(H)  AST 5 - 34 U/L 19 20 33  ALT 0 - 55 U/L _1 Uric acid 3.1    06/22/17 Cytogenetics Report:   06/22/17 Flow Cytometry:    06/22/17 BM Bx:    RADIOGRAPHIC STUDIES:  I have personally reviewed the radiological images as listed and agreed with the findings in the report. No results found.  ASSESSMENT & PLAN:    Joseph Powell is a 62 y.o. 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 Patient has some chronic thrombocytopenia 85-125k for a few years. Platelets upon initial visit are down to 36k in the setting of newly diagnosed lymphoma and treatment with R-CHOEP and from hypersplenism but then improved. PLt counts had normalized after   #4 Elevated ALK PO4 - stable - likely from ctx/neulasta  # 5 Anemia from NHL+ Chemotherapy - had dropped to 6.6-> improved to 8.5 post PRBC transfusion  # 6 Mild neutropenia - resolved with Neulasta  # 7 previous Significant Oro-pharyngeal thrush -- causing low grade fever and change in taste -- treated  aggressively with Nystatin mouthwashes and PO Fluconazole. Resolved -- continuing fluconazole prophylaxis.  Plan - -Discussed pt labwork today, 09/14/17; anemia noted with Hgb at 9.7, PLT improved to 59k, resolved neutropenia with ANC at 8.6k. Non-fasting blood chemistries are  stable.  -Advised consuming less simple sugars in favor of substituting complex carbs -Continue with PET scan in July on day 100 -Will continue to watch labs every few weeks  -No need for transfusion support at this time -Continue eating well and maintaining crowd avoidance precautions. -Continue as needed salt/baking soda mouthwash -Will see pt back in 4 weeks  --we will defer transplant cares to Dr Dellis Filbert  #2 History of Melanoma, 2009 -Followed by Dr. Allyson Sabal for continued skin screening   #3 Patient Active Problem List   Diagnosis Date Noted  . Pulmonary infiltrates 06/24/2017  . Peripheral T cell lymphoma of intrathoracic lymph nodes ([place]) 03/11/2017  . Counseling regarding advanced care planning and goals of care 02/12/2017  . Mature T/NK-cell lymphomas, unspecified, lymph nodes of multiple sites ([place]) 02/10/2017  . Cough variant asthma vs UACS 01/27/2017  . Dyslipidemia 08/05/2016  . Mild asthma 08/05/2016  . DJD (degenerative joint disease) 08/05/2016  . HLD (hyperlipidemia) 07/27/2016  . GERD (gastroesophageal reflux disease) 07/27/2016  . Gout 07/27/2016  . Discomfort in chest 07/27/2016  . OSA (obstructive sleep apnea) 09/19/2013  . Thrombocytopenia ([place]) 04/14/2011  . SCHATZKI'S RING 08/21/2009   #4 Left sided jaw pain resolved  #5 Urinary urgency -UA/UCx neg for infection -Discussed that cyclophosphamide or mucositis could be affecting his urgent urination; continued to encourage pt to stay well hydrated.  -improving symptoms post chemotherapy   RTC with Dr Irene Limbo in 4 weeks with labs    All of the patients questions were answered with apparent satisfaction. The patient knows to call the clinic with any problems, questions or concerns.  The toal time spent in the appt was 25 minutes and more than 50% was on counseling and direct patient cares.    Sullivan Lone MD [place] AAHIVMS [facility] Hematology/Oncology Physician [facility]  (Office):        [phone] (Work cell):  [phone] (Fax):           [phone]  This document serves as a record of services personally performed by Sullivan Lone, MD. It was created on his behalf by Baldwin [place], a trained medical scribe. The creation of this record is based on the scribe's personal observations and the provider's statements to them.   .I have reviewed the above documentation for accuracy and completeness, and I agree with the above. Brunetta Genera MD MS

## 2017-09-14 ENCOUNTER — Inpatient Hospital Stay: Payer: BLUE CROSS/BLUE SHIELD

## 2017-09-14 ENCOUNTER — Telehealth: Payer: Self-pay | Admitting: Hematology

## 2017-09-14 ENCOUNTER — Inpatient Hospital Stay: Payer: BLUE CROSS/BLUE SHIELD | Attending: Hematology | Admitting: Hematology

## 2017-09-14 VITALS — BP 115/77 | HR 100 | Temp 98.7°F | Resp 17 | Ht 71.0 in | Wt 158.0 lb

## 2017-09-14 DIAGNOSIS — C8442 Peripheral T-cell lymphoma, not classified, intrathoracic lymph nodes: Secondary | ICD-10-CM | POA: Diagnosis not present

## 2017-09-14 DIAGNOSIS — D6481 Anemia due to antineoplastic chemotherapy: Secondary | ICD-10-CM | POA: Diagnosis not present

## 2017-09-14 DIAGNOSIS — D731 Hypersplenism: Secondary | ICD-10-CM | POA: Diagnosis not present

## 2017-09-14 DIAGNOSIS — C8498 Mature T/NK-cell lymphomas, unspecified, lymph nodes of multiple sites: Secondary | ICD-10-CM | POA: Diagnosis present

## 2017-09-14 DIAGNOSIS — R3915 Urgency of urination: Secondary | ICD-10-CM | POA: Insufficient documentation

## 2017-09-14 DIAGNOSIS — D696 Thrombocytopenia, unspecified: Secondary | ICD-10-CM | POA: Diagnosis not present

## 2017-09-14 LAB — CMP (CANCER CENTER ONLY)
ALBUMIN: 3.6 g/dL (ref 3.5–5.0)
ALT: 22 U/L (ref 0–55)
AST: 19 U/L (ref 5–34)
Alkaline Phosphatase: 201 U/L — ABNORMAL HIGH (ref 40–150)
Anion gap: 9 (ref 3–11)
BUN: 11 mg/dL (ref 7–26)
CHLORIDE: 105 mmol/L (ref 98–109)
CO2: 26 mmol/L (ref 22–29)
Calcium: 9.6 mg/dL (ref 8.4–10.4)
Creatinine: 0.78 mg/dL (ref 0.70–1.30)
GFR, Est AFR Am: >60 mL/min (ref >60)
Glucose, Bld: 216 mg/dL — ABNORMAL HIGH (ref 70–140)
POTASSIUM: 3.8 mmol/L (ref 3.5–5.1)
Sodium: 140 mmol/L (ref 136–145)
Total Bilirubin: 0.4 mg/dL (ref 0.2–1.2)
Total Protein: 6.3 g/dL — ABNORMAL LOW (ref 6.4–8.3)

## 2017-09-14 LAB — CBC WITH DIFFERENTIAL (CANCER CENTER ONLY)
BASOS ABS: 0 10*3/uL (ref 0.0–0.1)
Basophils Relative: 0 %
Eosinophils Absolute: 0 10*3/uL (ref 0.0–0.5)
Eosinophils Relative: 0 %
HEMATOCRIT: 29.6 % — AB (ref 38.4–49.9)
Hemoglobin: 9.7 g/dL — ABNORMAL LOW (ref 13.0–17.1)
LYMPHS ABS: 0.8 10*3/uL — AB (ref 0.9–3.3)
LYMPHS PCT: 7 %
MCH: 28.8 pg (ref 27.2–33.4)
MCHC: 32.8 g/dL (ref 32.0–36.0)
MCV: 87.8 fL (ref 79.3–98.0)
MONO ABS: 1.7 10*3/uL — AB (ref 0.1–0.9)
Monocytes Relative: 16 %
NEUTROS ABS: 8.6 10*3/uL — AB (ref 1.5–6.5)
Neutrophils Relative %: 77 %
Platelet Count: 59 10*3/uL — ABNORMAL LOW (ref 140–400)
RBC: 3.37 MIL/uL — AB (ref 4.20–5.82)
RDW: 21.2 % — ABNORMAL HIGH (ref 11.0–14.6)
WBC: 11.2 10*3/uL — AB (ref 4.0–10.3)

## 2017-09-14 LAB — RETICULOCYTES
RBC.: 3.37 MIL/uL — ABNORMAL LOW (ref 4.20–5.82)
Retic Count, Absolute: 64 10*3/uL (ref 34.8–93.9)
Retic Ct Pct: 1.9 % — ABNORMAL HIGH (ref 0.8–1.8)

## 2017-09-14 LAB — LACTATE DEHYDROGENASE: LDH: 180 U/L (ref 125–245)

## 2017-09-14 NOTE — Telephone Encounter (Signed)
Gave patient avs report and appointments for June.  °

## 2017-09-20 ENCOUNTER — Other Ambulatory Visit (HOSPITAL_COMMUNITY): Payer: Self-pay | Admitting: Cardiology

## 2017-10-13 ENCOUNTER — Ambulatory Visit (HOSPITAL_BASED_OUTPATIENT_CLINIC_OR_DEPARTMENT_OTHER)
Admission: RE | Admit: 2017-10-13 | Discharge: 2017-10-13 | Disposition: A | Discharge status: Home / Self Care | Payer: BLUE CROSS/BLUE SHIELD | Source: Ambulatory Visit | Attending: Cardiology | Admitting: Cardiology

## 2017-10-13 ENCOUNTER — Encounter (HOSPITAL_COMMUNITY): Payer: Self-pay | Admitting: Cardiology

## 2017-10-13 ENCOUNTER — Ambulatory Visit (HOSPITAL_COMMUNITY)
Admission: RE | Admit: 2017-10-13 | Discharge: 2017-10-13 | Disposition: A | Discharge status: Home / Self Care | Payer: BLUE CROSS/BLUE SHIELD | Source: Ambulatory Visit | Attending: Cardiology | Admitting: Cardiology

## 2017-10-13 VITALS — BP 111/66 | HR 75 | Wt 161.8 lb

## 2017-10-13 DIAGNOSIS — Z79899 Other long term (current) drug therapy: Secondary | ICD-10-CM | POA: Insufficient documentation

## 2017-10-13 DIAGNOSIS — X58XXXA Exposure to other specified factors, initial encounter: Secondary | ICD-10-CM | POA: Insufficient documentation

## 2017-10-13 DIAGNOSIS — C9150 Adult T-cell lymphoma/leukemia (HTLV-1-associated) not having achieved remission: Secondary | ICD-10-CM | POA: Diagnosis not present

## 2017-10-13 DIAGNOSIS — I5030 Unspecified diastolic (congestive) heart failure: Secondary | ICD-10-CM | POA: Insufficient documentation

## 2017-10-13 DIAGNOSIS — K219 Gastro-esophageal reflux disease without esophagitis: Secondary | ICD-10-CM | POA: Insufficient documentation

## 2017-10-13 DIAGNOSIS — R0789 Other chest pain: Secondary | ICD-10-CM

## 2017-10-13 DIAGNOSIS — E785 Hyperlipidemia, unspecified: Secondary | ICD-10-CM | POA: Insufficient documentation

## 2017-10-13 DIAGNOSIS — I348 Other nonrheumatic mitral valve disorders: Secondary | ICD-10-CM | POA: Diagnosis not present

## 2017-10-13 DIAGNOSIS — I427 Cardiomyopathy due to drug and external agent: Secondary | ICD-10-CM | POA: Insufficient documentation

## 2017-10-13 DIAGNOSIS — J45909 Unspecified asthma, uncomplicated: Secondary | ICD-10-CM | POA: Diagnosis not present

## 2017-10-13 DIAGNOSIS — T451X5A Adverse effect of antineoplastic and immunosuppressive drugs, initial encounter: Secondary | ICD-10-CM

## 2017-10-13 DIAGNOSIS — G4733 Obstructive sleep apnea (adult) (pediatric): Secondary | ICD-10-CM | POA: Insufficient documentation

## 2017-10-13 DIAGNOSIS — Z800 Family history of malignant neoplasm of digestive organs: Secondary | ICD-10-CM | POA: Insufficient documentation

## 2017-10-13 DIAGNOSIS — Z9889 Other specified postprocedural states: Secondary | ICD-10-CM | POA: Diagnosis not present

## 2017-10-13 NOTE — Progress Notes (Signed)
  Echocardiogram 2D Echocardiogram has been performed.  Joseph Powell 10/13/2017, 10:57 AM

## 2017-10-13 NOTE — Patient Instructions (Signed)
Your physician has requested that you have an echocardiogram. Echocardiography is a painless test that uses sound waves to create images of your heart. It provides your doctor with information about the size and shape of your heart and how well your heart's chambers and valves are working. This procedure takes approximately one hour. There are no restrictions for this procedure.  Your physician recommends that you schedule a follow-up appointment in: 6 months with Dr. Aundra Dubin and  an a echocardiogram Please Call an Schedule Appointment (Call in September)

## 2017-10-14 NOTE — Progress Notes (Signed)
HEMATOLOGY/ONCOLOGY CLINIC NOTE  Date of Service: 10/15/17  Patient Care Team: Hulan Fess, MD as PCP - General (Family Medicine)  CHIEF COMPLAINTS/PURPOSE OF CONSULTATION:   F/u for high gradeT cell NHL  CURRENT THERAPY: post BMT active surveillance  Previous Treatment; R-CHOEP x 6 cycles BEAM -Auto HSCT  HISTORY OF PRESENTING ILLNESS:  See previous note for details  INTERVAL HISTORY   Joseph Powell is a wonderful 62 y.o. male here for his scheduled followup after his transplant on 08/05/17 The patient's last visit with [place] was on 09/14/17. He is accompanied today by his wife. The pt reports that he is doing well overall. He last saw Dr Dellis Filbert at [facility] 09/03/17 and will see her next on July 12 with a repeat PET, labs, and possible BM Bx.   The pt reports that he has no new concerns and has been taking his Allopurinol after having his last gout attack 3 weeks ago in his right foot.  He is not taking any OTC medications to control his gout and will discuss his dose with his PCP Dr Hulan Fess. He has stopped taking Aspirin and has continued taking Protonix twice a day without having much GERD symptoms recently besides a productive cough that continues to improve.   The pt notes that he has historically had low platelets and is currently taking Bactrim with folic acid and Acyclovir.   He notes that he continues eating well, is gaining weight appropriately, and is still about 36 pounds shy of his pre-treatment weight of 200 pounds.   The pt has had an ECHO on 10/13/17 which revealed some concerns for changes due to sleep apnea. He has not used his CPAP recently, since his previous weight loss decreased his symptoms of sleep apnea.   Lab results today (10/15/17) of CBC, CMP, and Reticulocytes is as follows: all values are WNL except for RBC at 3.40, Hgb at 10.6, HCT at 32.0, RDW at 21.3, Platelets at 51k, Total Protein at 6.2, Alk Phos at 158. Magnesium 10/15/17 is WNL at  1.8.  On review of systems, pt reports good energy levels, improving productive cough, recent gout flare, appropriate weight gain, and denies fevers, chills, concerns for infections, mouth sores, abdominal pains, leg swelling, and any other symptoms.    MEDICAL HISTORY:  Past Medical History:  Diagnosis Date  . Allergic rhinitis   . Asthma    slight  . Bell's palsy   . GERD (gastroesophageal reflux disease)   . Gout   . Hypercholesteremia   . Melanoma ([place])   . Osteoarthritis   . Sleep apnea    Resolving  . Thrombocytopenia ([place])   . Tubular adenoma     SURGICAL HISTORY: Past Surgical History:  Procedure Laterality Date  . IR FLUORO GUIDE PORT INSERTION RIGHT  02/17/2017  . IR US GUIDE VASC ACCESS RIGHT  02/17/2017  . KNEE SURGERY  1993  . MELANOMA EXCISION    . NASAL RECONSTRUCTION    . VASECTOMY  1999    SOCIAL HISTORY: Social History   Socioeconomic History  . Marital status: Married    Spouse name: Not on file  . Number of children: Not on file  . Years of education: Not on file  . Highest education level: Not on file  Occupational History  . Occupation: Risk analyst  Social Needs  . Financial resource strain: Not on file  . Food insecurity:    Worry: Not on file    Inability:  Not on file  . Transportation needs:    Medical: Not on file    Non-medical: Not on file  Tobacco Use  . Smoking status: Never Smoker  . Smokeless tobacco: Never Used  Substance and Sexual Activity  . Alcohol use: Yes    Alcohol/week: 1.0 oz    Types: 2 Standard drinks or equivalent per week    Comment: socially  . Drug use: No  . Sexual activity: Not on file  Lifestyle  . Physical activity:    Days per week: Not on file    Minutes per session: Not on file  . Stress: Not on file  Relationships  . Social connections:    Talks on phone: Not on file    Gets together: Not on file    Attends religious service: Not on file    Active member of club or organization: Not on  file    Attends meetings of clubs or organizations: Not on file    Relationship status: Not on file  . Intimate partner violence:    Fear of current or ex partner: Not on file    Emotionally abused: Not on file    Physically abused: Not on file    Forced sexual activity: Not on file  Other Topics Concern  . Not on file  Social History Narrative  . Not on file    FAMILY HISTORY: Family History  Problem Relation Age of Onset  . Diabetes Mother   . Asthma Mother   . Gout Brother   . Colon cancer Paternal Uncle   . Esophageal cancer Neg Hx   . Stomach cancer Neg Hx     ALLERGIES:  is allergic to penicillins.  MEDICATIONS:  Current Outpatient Medications  Medication Sig Dispense Refill  . acyclovir (ZOVIRAX) 800 MG tablet Take 800 mg by mouth 2 (two) times daily.    Marland Kitchen albuterol (PROVENTIL HFA;VENTOLIN HFA) 108 (90 Base) MCG/ACT inhaler Inhale 1-2 puffs into the lungs every 6 (six) hours as needed for wheezing or shortness of breath.    . allopurinol (ZYLOPRIM) 100 MG tablet Take 100 mg by mouth 2 (two) times daily.    . carvedilol (COREG) 3.125 MG tablet TAKE 1 TABLET BY MOUTH TWICE DAILY WITH A MEAL 60 tablet 3  . desloratadine (CLARINEX) 5 MG tablet Take 5 mg by mouth daily.    . folic acid (FOLVITE) 1 MG tablet Take 1 mg by mouth daily.    . mometasone-formoterol (DULERA) 100-5 MCG/ACT AERO Inhale 2 puffs into the lungs 2 (two) times daily. 1 Inhaler 11  . pantoprazole (PROTONIX) 40 MG tablet Take 40 mg by mouth 2 (two) times daily.     Marland Kitchen sulfamethoxazole-trimethoprim (BACTRIM DS,SEPTRA DS) 800-160 MG tablet Take 1 tablet by mouth. Every,monday, Wednesday, and Friday    . traZODone (DESYREL) 50 MG tablet Take 1-2 tablets (50-100 mg total) by mouth at bedtime. 60 tablet 2   Current Facility-Administered Medications  Medication Dose Route Frequency Provider Last Rate Last Dose  . 0.9 %  sodium chloride infusion  500 mL Intravenous Continuous Milus Banister, MD         REVIEW OF SYSTEMS:   A 10+ POINT REVIEW OF SYSTEMS WAS OBTAINED including neurology, dermatology, psychiatry, cardiac, respiratory, lymph, extremities, GI, GU, Musculoskeletal, constitutional, breasts, reproductive, HEENT.  All pertinent positives are noted in the HPI.  All others are negative.   PHYSICAL EXAMINATION: ECOG PERFORMANCE STATUS: 1 - Symptomatic but completely ambulatory  Vitals:  10/15/17 1035  BP: 100/63  Pulse: 86  Resp: 17  Temp: 98.3 F (36.8 C)  SpO2: 97%   Filed Weights   10/15/17 1035  Weight: 164 lb (74.4 kg)   .Body mass index is 22.87 kg/m.  GENERAL:alert, in no acute distress and comfortable SKIN: no acute rashes, no significant lesions EYES: conjunctiva are pink and non-injected, sclera anicteric OROPHARYNX: MMM, no exudates, no oropharyngeal erythema or ulceration NECK: supple, no JVD LYMPH:  no palpable lymphadenopathy in the cervical, axillary or inguinal regions LUNGS: clear to auscultation b/l with normal respiratory effort HEART: regular rate & rhythm ABDOMEN:  normoactive bowel sounds , non tender, not distended. Extremity: no pedal edema PSYCH: alert & oriented x 3 with fluent speech NEURO: no focal motor/sensory deficits   LABORATORY DATA:  I have reviewed the data as listed  . CBC Latest Ref Rng & Units 10/15/2017 09/14/2017 08/31/2017  WBC 4.0 - 10.3 K/uL 8.4 11.2(H) 7.7  Hemoglobin 13.0 - 17.1 g/dL 10.6(L) 9.7(L) 10.0(L)  Hematocrit 38.4 - 49.9 % 32.0(L) 29.6(L) 30.5(L)  Platelets 140 - 400 K/uL 51(L) 59(L) 19(L)  ANC 8.6k . CMP Latest Ref Rng & Units 10/15/2017 09/14/2017 08/31/2017  Glucose 70 - 140 mg/dL 117 216(H) 117  BUN 7 - 26 mg/dL 7 11 6(L)  Creatinine 0.70 - 1.30 mg/dL 0.76 0.78 0.73  Sodium 136 - 145 mmol/L 140 140 138  Potassium 3.5 - 5.1 mmol/L 3.5 3.8 4.4  Chloride 98 - 109 mmol/L 106 105 104  CO2 22 - 29 mmol/L '26 26 27  '$ Calcium 8.4 - 10.4 mg/dL 9.2 9.6 9.5  Total Protein 6.4 - 8.3 g/dL 6.2(L) 6.3(L) 6.1(L)   Total Bilirubin 0.2 - 1.2 mg/dL 0.5 0.4 0.6  Alkaline Phos 40 - 150 U/L 158(H) 201(H) 177(H)  AST 5 - 34 U/L '21 19 20  '$ ALT 0 - 55 U/L '16 22 19   '$ Uric acid 3.1    06/22/17 Cytogenetics Report:   06/22/17 Flow Cytometry:    06/22/17 BM Bx:    RADIOGRAPHIC STUDIES:  I have personally reviewed the radiological images as listed and agreed with the findings in the report. No results found.  ASSESSMENT & PLAN:    KERN GINGRAS is a 62 y.o. 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 Patient has some chronic thrombocytopenia 85-125k for a few years. Platelets upon initial visit are down to 36k in the setting of newly diagnosed lymphoma and treatment with R-CHOEP and from hypersplenism but then improved. PLt counts had normalized after treatment of the lymphoma pre-transplant - likely related to post-transplant meds -Acyclovir/bactrim/allopurinol vs delayed post-transplant recovery  #4 Elevated ALK PO4 - stable - likely from ctx/neulasta  #5 Post transplant Anemia -- hgb improved from 9.7 to 10.6  # 6 Mild neutropenia - resolved with Neulasta  #7 post transplant Antimicrobial prophylaxis - per transplant team. Plan - Discussed pt labwork today, 10/15/17; HGB increased to 10.6, WBC are WNL at 8.4k, Platelets at 51k -Pt has continued on Protonix, Allopurinol, Bactrim with folic acid and Acyclovir, likely explaining his low platelets -Begin backing off of Protonix from BID -Will allow transplant team to determine length of Bactrim and Acyclovir course -Continue with appropriate infection prevention strategies including wearing a mask when in public spaces and limiting exposure to large crowds -Will see pt back in 2 months -Recommended salt and  baking soda mouthwash  -Continue with PET scan in July 2019 Around day 100, re-evaluation of disease  status. -vaccination schedule will likely start from about 6 months post-transplant if in CR. -No need for transfusion support at this time -we will defer transplant cares to Dr Dellis Filbert RTC with Dr Irene Limbo in 2 months with labs  #2 History of Melanoma, 2009 -Followed by Dr. Allyson Sabal for continued skin screening   #3 Patient Active Problem List   Diagnosis Date Noted  . Pulmonary infiltrates 06/24/2017  . Peripheral T cell lymphoma of intrathoracic lymph nodes ([place]) 03/11/2017  . Counseling regarding advanced care planning and goals of care 02/12/2017  . Mature T/NK-cell lymphomas, unspecified, lymph nodes of multiple sites ([place]) 02/10/2017  . Cough variant asthma vs UACS 01/27/2017  . Dyslipidemia 08/05/2016  . Mild asthma 08/05/2016  . DJD (degenerative joint disease) 08/05/2016  . HLD (hyperlipidemia) 07/27/2016  . GERD (gastroesophageal reflux disease) 07/27/2016  . Gout 07/27/2016  . Discomfort in chest 07/27/2016  . OSA (obstructive sleep apnea) 09/19/2013  . Thrombocytopenia ([place]) 04/14/2011  . SCHATZKI'S RING 08/21/2009   #4 Left sided jaw pain resolved  RTC with Dr Irene Limbo in 2 months with labs   All of the patients questions were answered with apparent satisfaction. The patient knows to call the clinic with any problems, questions or concerns.  The total time spent in the appt was 30 minutes and more than 50% was on counseling and direct patient cares.    Joseph Lone MD [place] AAHIVMS [facility] Hematology/Oncology Physician [facility]  (Office):       [phone] (Work cell):  [phone] (Fax):           [phone]  I, Baldwin [place], am acting as a scribe for Dr Irene Limbo.   .I have reviewed the above documentation for accuracy and completeness, and I agree with the above. Brunetta Genera MD

## 2017-10-14 NOTE — Progress Notes (Signed)
Oncology: [name] PCP: Dr. Rex Kras Cardiology: Dr. Aundra Dubin  62 yo with history of stage IV high grade peripheral T cell lymphoma was referred by [name] for cardio-oncology evaluation.  CT in 9/18 showed extensive retroperitoneal, mesenteric, and left pelvic LAN along with splenic and liver masses.  Chemotherapy started in 10/18 with R-CHOEP (cyclophosphamide, doxorubicin, vincristine, prednisone, etoposide).  Echo in 10/18 showed EF 55-60%.  12/18 PET with complete metabolic response but showed a small pericardial effusion. Patient was then sent for evaluation by cardiology.  Echo in 1/19 showed normal EF but abnormal global longitudinal strain pattern.  There is no pericardial effusion.    In 3/19, patient had autologous blood stem cell transplant with BEAM chemo.   Patient returns for cardio-oncology followup.  No exertional dyspnea or chest pain.  No lightheadedness, orthopnea, or PND.   Labs (1/19): K 4.3, creatinine 0.72  PMH: 1. Stage IV high grade peripheral T cell lymphoma: CT in 9/18 showed extensive retroperitoneal, mesenteric, and left pelvic LAN along with splenic and liver masses.  Chemotherapy started in 10/18 with R-CHOEP (cyclophosphamide, doxorubicin, vincristine, prednisone, etoposide).   - 12/18 PET with complete metabolic response.  - Plan for stem cell transplant at [facility] => done 3/19.  - Echo (10/18): EF 55-60%.  - Echo (1/19): EF 55-60%, GLS -12.2%, moderate diastolic dysfunction.  - Echo (6/19): EF 55-60%, moderate diastolic dysfunction, GLS -22.9%, mildly decreased RV systolic function.  2. GERD 3. Hyperlipidemia 4. Asthma 5. OSA  Social History   Socioeconomic History  . Marital status: Married    Spouse name: Not on file  . Number of children: Not on file  . Years of education: Not on file  . Highest education level: Not on file  Occupational History  . Occupation: Risk analyst  Social Needs  . Financial resource strain: Not on file  . Food insecurity:     Worry: Not on file    Inability: Not on file  . Transportation needs:    Medical: Not on file    Non-medical: Not on file  Tobacco Use  . Smoking status: Never Smoker  . Smokeless tobacco: Never Used  Substance and Sexual Activity  . Alcohol use: Yes    Alcohol/week: 1.0 oz    Types: 2 Standard drinks or equivalent per week    Comment: socially  . Drug use: No  . Sexual activity: Not on file  Lifestyle  . Physical activity:    Days per week: Not on file    Minutes per session: Not on file  . Stress: Not on file  Relationships  . Social connections:    Talks on phone: Not on file    Gets together: Not on file    Attends religious service: Not on file    Active member of club or organization: Not on file    Attends meetings of clubs or organizations: Not on file    Relationship status: Not on file  . Intimate partner violence:    Fear of current or ex partner: Not on file    Emotionally abused: Not on file    Physically abused: Not on file    Forced sexual activity: Not on file  Other Topics Concern  . Not on file  Social History Narrative  . Not on file   Family History  Problem Relation Age of Onset  . Diabetes Mother   . Asthma Mother   . Gout Brother   . Colon cancer Paternal Uncle   .  Esophageal cancer Neg Hx   . Stomach cancer Neg Hx    ROS: All systems reviewed and negative except as per HPI.  Current Outpatient Medications  Medication Sig Dispense Refill  . acyclovir (ZOVIRAX) 800 MG tablet Take 800 mg by mouth 2 (two) times daily.    Marland Kitchen albuterol (PROVENTIL HFA;VENTOLIN HFA) 108 (90 Base) MCG/ACT inhaler Inhale 1-2 puffs into the lungs every 6 (six) hours as needed for wheezing or shortness of breath.    . allopurinol (ZYLOPRIM) 100 MG tablet Take 100 mg by mouth 2 (two) times daily.    . carvedilol (COREG) 3.125 MG tablet TAKE 1 TABLET BY MOUTH TWICE DAILY WITH A MEAL 60 tablet 3  . desloratadine (CLARINEX) 5 MG tablet Take 5 mg by mouth daily.    .  folic acid (FOLVITE) 1 MG tablet Take 1 mg by mouth daily.    . mometasone-formoterol (DULERA) 100-5 MCG/ACT AERO Inhale 2 puffs into the lungs 2 (two) times daily. 1 Inhaler 11  . pantoprazole (PROTONIX) 40 MG tablet Take 40 mg by mouth daily.     Marland Kitchen sulfamethoxazole-trimethoprim (BACTRIM DS,SEPTRA DS) 800-160 MG tablet Take 1 tablet by mouth. Every,monday, Wednesday, and Friday    . traZODone (DESYREL) 50 MG tablet Take 1-2 tablets (50-100 mg total) by mouth at bedtime. 60 tablet 2   Current Facility-Administered Medications  Medication Dose Route Frequency Provider Last Rate Last Dose  . 0.9 %  sodium chloride infusion  500 mL Intravenous Continuous Milus Banister, MD       BP 111/66   Pulse 75   Wt 161 lb 12 oz (73.4 kg)   SpO2 98%   BMI 22.56 kg/m  General: NAD Neck: No JVD, no thyromegaly or thyroid nodule.  Lungs: Clear to auscultation bilaterally with normal respiratory effort. CV: Nondisplaced PMI.  Heart regular S1/S2, no S3/S4, no murmur.  No peripheral edema.  No carotid bruit.  Normal pedal pulses.  Abdomen: Soft, nontender, no hepatosplenomegaly, no distention.  Skin: Intact without lesions or rashes.  Neurologic: Alert and oriented x 3.  Psych: Normal affect. Extremities: No clubbing or cyanosis.  HEENT: Normal.   Assessment/Plan: 1. Cardiomyopathy: Patient completed a chemo regimen involving doxorubicin.  Echo in 1/29 had poor images but there was concern for abnormal global longitudinal strain. Echo was done today and reviewed, EF was normal and strain pattern was normal.  The RV was mildly dysfunctional, this may be a result of sleep apnea history. - Continue Coreg 3.125 mg bid.   - I will get 1 more echo in 6 months to assess for late effects of doxorubicin.   2. Sinus tachycardia: Resolved.  3. Pericardial effusion: Resolved.    Loralie Champagne 10/14/2017

## 2017-10-15 ENCOUNTER — Inpatient Hospital Stay: Payer: BLUE CROSS/BLUE SHIELD

## 2017-10-15 ENCOUNTER — Telehealth: Payer: Self-pay | Admitting: Hematology

## 2017-10-15 ENCOUNTER — Encounter: Payer: Self-pay | Admitting: Hematology

## 2017-10-15 ENCOUNTER — Inpatient Hospital Stay: Payer: BLUE CROSS/BLUE SHIELD | Attending: Hematology | Admitting: Hematology

## 2017-10-15 VITALS — BP 100/63 | HR 86 | Temp 98.3°F | Resp 17 | Ht 71.0 in | Wt 164.0 lb

## 2017-10-15 DIAGNOSIS — D696 Thrombocytopenia, unspecified: Secondary | ICD-10-CM

## 2017-10-15 DIAGNOSIS — D649 Anemia, unspecified: Secondary | ICD-10-CM | POA: Diagnosis not present

## 2017-10-15 DIAGNOSIS — C8442 Peripheral T-cell lymphoma, not classified, intrathoracic lymph nodes: Secondary | ICD-10-CM

## 2017-10-15 DIAGNOSIS — Z946 Bone transplant status: Secondary | ICD-10-CM

## 2017-10-15 DIAGNOSIS — Z85820 Personal history of malignant melanoma of skin: Secondary | ICD-10-CM

## 2017-10-15 LAB — RETICULOCYTES
RBC.: 3.4 MIL/uL — ABNORMAL LOW (ref 4.20–5.82)
RETIC CT PCT: 1.1 % (ref 0.8–1.8)
Retic Count, Absolute: 37.4 10*3/uL (ref 34.8–93.9)

## 2017-10-15 LAB — CBC WITH DIFFERENTIAL (CANCER CENTER ONLY)
BASOS ABS: 0 10*3/uL (ref 0.0–0.1)
Basophils Relative: 0 %
EOS ABS: 0.1 10*3/uL (ref 0.0–0.5)
EOS PCT: 1 %
HCT: 32 % — ABNORMAL LOW (ref 38.4–49.9)
Hemoglobin: 10.6 g/dL — ABNORMAL LOW (ref 13.0–17.1)
LYMPHS ABS: 1.4 10*3/uL (ref 0.9–3.3)
Lymphocytes Relative: 16 %
MCH: 31.2 pg (ref 27.2–33.4)
MCHC: 33.1 g/dL (ref 32.0–36.0)
MCV: 94.1 fL (ref 79.3–98.0)
Monocytes Absolute: 0.6 10*3/uL (ref 0.1–0.9)
Monocytes Relative: 8 %
Neutro Abs: 6.3 10*3/uL (ref 1.5–6.5)
Neutrophils Relative %: 75 %
PLATELETS: 51 10*3/uL — AB (ref 140–400)
RBC: 3.4 MIL/uL — ABNORMAL LOW (ref 4.20–5.82)
RDW: 21.3 % — AB (ref 11.0–14.6)
WBC Count: 8.4 10*3/uL (ref 4.0–10.3)

## 2017-10-15 LAB — CMP (CANCER CENTER ONLY)
ALBUMIN: 4.2 g/dL (ref 3.5–5.0)
ALK PHOS: 158 U/L — AB (ref 40–150)
ALT: 16 U/L (ref 0–55)
ANION GAP: 8 (ref 3–11)
AST: 21 U/L (ref 5–34)
BILIRUBIN TOTAL: 0.5 mg/dL (ref 0.2–1.2)
BUN: 7 mg/dL (ref 7–26)
CO2: 26 mmol/L (ref 22–29)
Calcium: 9.2 mg/dL (ref 8.4–10.4)
Chloride: 106 mmol/L (ref 98–109)
Creatinine: 0.76 mg/dL (ref 0.70–1.30)
GFR, Estimated: >60 mL/min (ref >60)
GLUCOSE: 117 mg/dL (ref 70–140)
Potassium: 3.5 mmol/L (ref 3.5–5.1)
Sodium: 140 mmol/L (ref 136–145)
TOTAL PROTEIN: 6.2 g/dL — AB (ref 6.4–8.3)

## 2017-10-15 LAB — MAGNESIUM: Magnesium: 1.8 mg/dL (ref 1.7–2.4)

## 2017-10-15 NOTE — Telephone Encounter (Signed)
Scheduled appt per 6/13 los - pt aware - no print out wanted per patient .

## 2017-11-10 DIAGNOSIS — C8590 Non-Hodgkin lymphoma, unspecified, unspecified site: Secondary | ICD-10-CM | POA: Diagnosis not present

## 2017-11-13 DIAGNOSIS — Z9484 Stem cells transplant status: Secondary | ICD-10-CM | POA: Diagnosis not present

## 2017-11-13 DIAGNOSIS — C8448 Peripheral T-cell lymphoma, not classified, lymph nodes of multiple sites: Secondary | ICD-10-CM | POA: Diagnosis not present

## 2017-11-19 DIAGNOSIS — C8448 Peripheral T-cell lymphoma, not classified, lymph nodes of multiple sites: Secondary | ICD-10-CM | POA: Diagnosis not present

## 2017-11-19 DIAGNOSIS — D649 Anemia, unspecified: Secondary | ICD-10-CM | POA: Diagnosis not present

## 2017-11-19 DIAGNOSIS — Z8579 Personal history of other malignant neoplasms of lymphoid, hematopoietic and related tissues: Secondary | ICD-10-CM | POA: Diagnosis not present

## 2017-11-19 DIAGNOSIS — Z9484 Stem cells transplant status: Secondary | ICD-10-CM | POA: Diagnosis not present

## 2017-11-19 DIAGNOSIS — D696 Thrombocytopenia, unspecified: Secondary | ICD-10-CM | POA: Diagnosis not present

## 2017-11-23 ENCOUNTER — Ambulatory Visit: Payer: BLUE CROSS/BLUE SHIELD | Admitting: Internal Medicine

## 2017-12-03 DIAGNOSIS — Z8739 Personal history of other diseases of the musculoskeletal system and connective tissue: Secondary | ICD-10-CM | POA: Diagnosis not present

## 2017-12-03 DIAGNOSIS — Z85820 Personal history of malignant melanoma of skin: Secondary | ICD-10-CM | POA: Diagnosis not present

## 2017-12-03 DIAGNOSIS — Z125 Encounter for screening for malignant neoplasm of prostate: Secondary | ICD-10-CM | POA: Diagnosis not present

## 2017-12-03 DIAGNOSIS — Z8639 Personal history of other endocrine, nutritional and metabolic disease: Secondary | ICD-10-CM | POA: Diagnosis not present

## 2017-12-03 DIAGNOSIS — Z8572 Personal history of non-Hodgkin lymphomas: Secondary | ICD-10-CM | POA: Diagnosis not present

## 2017-12-03 DIAGNOSIS — E782 Mixed hyperlipidemia: Secondary | ICD-10-CM | POA: Diagnosis not present

## 2017-12-10 DIAGNOSIS — R197 Diarrhea, unspecified: Secondary | ICD-10-CM | POA: Diagnosis not present

## 2017-12-10 DIAGNOSIS — Z1389 Encounter for screening for other disorder: Secondary | ICD-10-CM | POA: Diagnosis not present

## 2017-12-10 DIAGNOSIS — Z9481 Bone marrow transplant status: Secondary | ICD-10-CM | POA: Diagnosis not present

## 2017-12-17 NOTE — Progress Notes (Signed)
HEMATOLOGY/ONCOLOGY CLINIC NOTE  Date of Service: 12/18/17   Patient Care Team: Hulan Fess, MD as PCP - General (Family Medicine)  CHIEF COMPLAINTS/PURPOSE OF CONSULTATION:   F/u for high gradeT cell NHL  CURRENT THERAPY: post BMT active surveillance  Previous Treatment; R-CHOEP x 6 cycles BEAM -Auto HSCT  HISTORY OF PRESENTING ILLNESS:  See previous note for details  INTERVAL HISTORY   Joseph Powell is a wonderful 62 y.o. male here for his scheduled followup after his transplant on 08/05/17. The patient's last visit with [place] was on 10/15/17. He is accompanied today by his wife. The pt reports that he is doing well overall.   The pt reports that he has been having more diarrhea in the last 2-3 weeks. He notes that he hd water stools for the first several days, began taking Imodium and denies cramping, blood or mucous in the stools. He does not associate these symptoms with any diet necessarily. He has taken Imodium which has slowed his frequency and he has had more formed stools. The pt discussed this with his transplant team and had repeat blood work. The pt notes that currently his stools are alternating between formed and water.   The pt notes that he continues taking acid suppressants BID which he was originally placed on for coughs, and denies having recent reflux or coughs.   The pt continues to take 865m Acyclovir BID.   He will see his transplant team next on February 04, 2018 and every 3 months.   Lab results today (12/18/17) of CBC w/diff, CMP, and Reticulocytes is as follows: all values are WNL except for WBC at 3.2k, RBC at 3.64, HGB at 11.5, HCT at 34.3, PLT at 64k, Total Protein at 6.4. LDH 12/18/17 is WNL at 166  On review of systems, pt reports diarrhea, stable energy levels, eating well, increasing weight, and denies acid reflux, cough, abdominal pains, cramping, blood in the stools, mucous in the stools, fevers, chill, night sweats, leg swelling.     MEDICAL HISTORY:  Past Medical History:  Diagnosis Date  . Allergic rhinitis   . Asthma    slight  . Bell's palsy   . GERD (gastroesophageal reflux disease)   . Gout   . Hypercholesteremia   . [place]   . Osteoarthritis   . Sleep apnea    Resolving  . [place]   . Tubular adenoma     SURGICAL HISTORY: Past Surgical History:  Procedure Laterality Date  . IR FLUORO GUIDE PORT INSERTION RIGHT  02/17/2017  . IR [place] VASC ACCESS RIGHT  02/17/2017  . KNEE SURGERY  1993  . MELANOMA EXCISION    . NASAL RECONSTRUCTION    . VASECTOMY  1999    SOCIAL HISTORY: Social History   Socioeconomic History  . Marital status: Married    Spouse name: Not on file  . Number of children: Not on file  . Years of education: Not on file  . Highest education level: Not on file  Occupational History  . Occupation: gRisk analyst Social Needs  . Financial resource strain: Not on file  . Food insecurity:    Worry: Not on file    Inability: Not on file  . Transportation needs:    Medical: Not on file    Non-medical: Not on file  Tobacco Use  . Smoking status: Never Smoker  . Smokeless tobacco: Never Used  Substance and Sexual Activity  . Alcohol use: Yes  Alcohol/week: 2.0 standard drinks    Types: 2 Standard drinks or equivalent per week    Comment: socially  . Drug use: No  . Sexual activity: Not on file  Lifestyle  . Physical activity:    Days per week: Not on file    Minutes per session: Not on file  . Stress: Not on file  Relationships  . Social connections:    Talks on phone: Not on file    Gets together: Not on file    Attends religious service: Not on file    Active member of club or organization: Not on file    Attends meetings of clubs or organizations: Not on file    Relationship status: Not on file  . Intimate partner violence:    Fear of current or ex partner: Not on file    Emotionally abused: Not on file    Physically abused:  Not on file    Forced sexual activity: Not on file  Other Topics Concern  . Not on file  Social History Narrative  . Not on file    FAMILY HISTORY: Family History  Problem Relation Age of Onset  . Diabetes Mother   . Asthma Mother   . Gout Brother   . Colon cancer Paternal Uncle   . Esophageal cancer Neg Hx   . Stomach cancer Neg Hx     ALLERGIES:  is allergic to penicillins.  MEDICATIONS:  Current Outpatient Medications  Medication Sig Dispense Refill  . acyclovir (ZOVIRAX) 800 MG tablet Take 800 mg by mouth 2 (two) times daily.    . albuterol (PROVENTIL HFA;VENTOLIN HFA) 108 (90 Base) MCG/ACT inhaler Inhale 1-2 puffs into the lungs every 6 (six) hours as needed for wheezing or shortness of breath.    . allopurinol (ZYLOPRIM) 100 MG tablet Take 100 mg by mouth 2 (two) times daily.    . carvedilol (COREG) 3.125 MG tablet TAKE 1 TABLET BY MOUTH TWICE DAILY WITH A MEAL 60 tablet 3  . desloratadine (CLARINEX) 5 MG tablet Take 5 mg by mouth daily.    . folic acid (FOLVITE) 1 MG tablet Take 1 mg by mouth daily.    . mometasone-formoterol (DULERA) 100-5 MCG/ACT AERO Inhale 2 puffs into the lungs 2 (two) times daily. 1 Inhaler 11  . pantoprazole (PROTONIX) 40 MG tablet Take 40 mg by mouth 2 (two) times daily.     . sulfamethoxazole-trimethoprim (BACTRIM DS,SEPTRA DS) 800-160 MG tablet Take 1 tablet by mouth. Every,monday, Wednesday, and Friday    . traZODone (DESYREL) 50 MG tablet Take 1-2 tablets (50-100 mg total) by mouth at bedtime. 60 tablet 2   Current Facility-Administered Medications  Medication Dose Route Frequency Provider Last Rate Last Dose  . 0.9 %  sodium chloride infusion  500 mL Intravenous Continuous Jacobs, Daniel P, MD        REVIEW OF SYSTEMS:   A 10+ POINT REVIEW OF SYSTEMS WAS OBTAINED including neurology, dermatology, psychiatry, cardiac, respiratory, lymph, extremities, GI, GU, Musculoskeletal, constitutional, breasts, reproductive, HEENT.  All pertinent  positives are noted in the HPI.  All others are negative.   PHYSICAL EXAMINATION: ECOG PERFORMANCE STATUS: 1 - Symptomatic but completely ambulatory  Vitals:   12/18/17 1136  BP: 122/78  Pulse: 67  Resp: 18  Temp: 97.8 F (36.6 C)  SpO2: 99%   Filed Weights   12/18/17 1136  Weight: 173 lb 11.2 oz (78.8 kg)   .Body mass index is 24.23 kg/m.  GENERAL:alert, in no   acute distress and comfortable SKIN: no acute rashes, no significant lesions EYES: conjunctiva are pink and non-injected, sclera anicteric OROPHARYNX: MMM, no exudates, no oropharyngeal erythema or ulceration NECK: supple, no JVD LYMPH:  no palpable lymphadenopathy in the cervical, axillary or inguinal regions LUNGS: clear to auscultation b/l with normal respiratory effort HEART: regular rate & rhythm ABDOMEN:  normoactive bowel sounds , non tender, not distended. No palpable hepatosplenomegaly.  Extremity: no pedal edema PSYCH: alert & oriented x 3 with fluent speech NEURO: no focal motor/sensory deficits   LABORATORY DATA:  I have reviewed the data as listed  . CBC Latest Ref Rng & Units 12/18/2017 10/15/2017 09/14/2017  WBC 4.0 - 10.3 K/uL 3.2(L) 8.4 11.2(H)  Hemoglobin 13.0 - 17.1 g/dL 11.5(L) 10.6(L) 9.7(L)  Hematocrit 38.4 - 49.9 % 34.3(L) 32.0(L) 29.6(L)  Platelets 140 - 400 K/uL 64(L) 51(L) 59(L)  ANC 8.6k . CMP Latest Ref Rng & Units 12/18/2017 10/15/2017 09/14/2017  Glucose 70 - 99 mg/dL 98 117 216(H)  BUN 8 - 23 mg/dL 11 7 11  Creatinine 0.61 - 1.24 mg/dL 0.82 0.76 0.78  Sodium 135 - 145 mmol/L 144 140 140  Potassium 3.5 - 5.1 mmol/L 4.6 3.5 3.8  Chloride 98 - 111 mmol/L 108 106 105  CO2 22 - 32 mmol/L 29 26 26  Calcium 8.9 - 10.3 mg/dL 9.3 9.2 9.6  Total Protein 6.5 - 8.1 g/dL 6.4(L) 6.2(L) 6.3(L)  Total Bilirubin 0.3 - 1.2 mg/dL 0.6 0.5 0.4  Alkaline Phos 38 - 126 U/L 123 158(H) 201(H)  AST 15 - 41 U/L 23 21 19  ALT 0 - 44 U/L 21 16 22   Uric acid 3.1    06/22/17 Cytogenetics  Report:   06/22/17 Flow Cytometry:    06/22/17 BM Bx:    RADIOGRAPHIC STUDIES:  I have personally reviewed the radiological images as listed and agreed with the findings in the report. No results found.   11/13/17 PET/CT    ASSESSMENT & PLAN:    Joseph Powell is a 62 y.o. 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 Patient has some chronic thrombocytopenia 85-125k for a few years. Platelets upon initial visit are down to 36k in the setting of newly diagnosed lymphoma and treatment with R-CHOEP and from hypersplenism but then improved. PLt counts had normalized after treatment of the lymphoma pre-transplant - likely related to post-transplant meds -Acyclovir/bactrim/allopurinol vs delayed post-transplant recovery  #3 Elevated ALK PO4 - stable - likely from ctx/neulasta  #4 Post transplant Anemia -- hgb improved from 9.7 to 10.6  # 5 Mild neutropenia - resolved with Neulasta  #6 post transplant Antimicrobial prophylaxis - per transplant team.  PLAN: -Pt has continued on Protonix, Allopurinol, Bactrim with folic acid and Acyclovir, likely explaining his low platelets. Vs iCR status  -Will allow transplant team to determine length of Bactrim and Acyclovir course -Continue with appropriate infection prevention strategies including wearing a mask when in public spaces and limiting exposure to large crowds -vaccination schedule will likely start from about 6 months post-transplant if in CR. -No need for transfusion support at this time -Discussed pt labwork today, 12/18/17; HGB improving to 11.5, PLT still low at 64k, LDH at 166, normal electrolytes, no neutropenia.  -Will order stool study -Discussed the 11/13/17 PET/CT revealed Findings consistent with complete response to therapy with no significant FDG avid masses identified -  Recommend weaning  acid suppressants -Begin taking Vitamin B complex -Take Clindamycin before next week's visit to dentist -Will refer pt to IR for port removal -Will see the pt back in 6 months as he is maintaining 3 month follow ups with his transplant team. Will see the pt sooner if any new concerns.     #7 History of Melanoma, 2009 -Followed by Dr. Allyson Sabal for continued skin screening   #8 Patient Active Problem List   Diagnosis Date Noted  . Pulmonary infiltrates 06/24/2017  . Peripheral T cell lymphoma of intrathoracic lymph nodes ([place]) 03/11/2017  . Counseling regarding advanced care planning and goals of care 02/12/2017  . Mature T/NK-cell lymphomas, unspecified, lymph nodes of multiple sites ([place]) 02/10/2017  . Cough variant asthma vs UACS 01/27/2017  . Dyslipidemia 08/05/2016  . Mild asthma 08/05/2016  . DJD (degenerative joint disease) 08/05/2016  . HLD (hyperlipidemia) 07/27/2016  . GERD (gastroesophageal reflux disease) 07/27/2016  . Gout 07/27/2016  . Discomfort in chest 07/27/2016  . OSA (obstructive sleep apnea) 09/19/2013  . Thrombocytopenia ([place]) 04/14/2011  . SCHATZKI'S RING 08/21/2009   #9 Left sided jaw pain resolved   Labs for stool studies today IR for port removal RTC with Dr Irene Limbo in 6 months with labs   All of the patients questions were answered with apparent satisfaction. The patient knows to call the clinic with any problems, questions or concerns.  The total time spent in the appt was 25 minutes and more than 50% was on counseling and direct patient cares.    Sullivan Lone MD MS AAHIVMS [facility] Hematology/Oncology Physician [facility]  (Office):       [phone] (Work cell):  [phone] (Fax):           [phone]  I, Baldwin [place], am acting as a scribe for [name]  .I have reviewed the above documentation for accuracy and completeness, and I agree with the above. Brunetta Genera MD

## 2017-12-18 ENCOUNTER — Telehealth: Payer: Self-pay | Admitting: Hematology

## 2017-12-18 ENCOUNTER — Inpatient Hospital Stay: Payer: BLUE CROSS/BLUE SHIELD

## 2017-12-18 ENCOUNTER — Inpatient Hospital Stay: Payer: BLUE CROSS/BLUE SHIELD | Attending: Hematology | Admitting: Hematology

## 2017-12-18 ENCOUNTER — Other Ambulatory Visit: Payer: Self-pay

## 2017-12-18 ENCOUNTER — Encounter: Payer: Self-pay | Admitting: Hematology

## 2017-12-18 VITALS — BP 122/78 | HR 67 | Temp 97.8°F | Resp 18 | Ht 71.0 in | Wt 173.7 lb

## 2017-12-18 DIAGNOSIS — D696 Thrombocytopenia, unspecified: Secondary | ICD-10-CM

## 2017-12-18 DIAGNOSIS — Z85820 Personal history of malignant melanoma of skin: Secondary | ICD-10-CM

## 2017-12-18 DIAGNOSIS — R197 Diarrhea, unspecified: Secondary | ICD-10-CM | POA: Diagnosis not present

## 2017-12-18 DIAGNOSIS — D6489 Other specified anemias: Secondary | ICD-10-CM

## 2017-12-18 DIAGNOSIS — C8442 Peripheral T-cell lymphoma, not classified, intrathoracic lymph nodes: Secondary | ICD-10-CM

## 2017-12-18 LAB — CMP (CANCER CENTER ONLY)
ALK PHOS: 123 U/L (ref 38–126)
ALT: 21 U/L (ref 0–44)
ANION GAP: 7 (ref 5–15)
AST: 23 U/L (ref 15–41)
Albumin: 4.2 g/dL (ref 3.5–5.0)
BILIRUBIN TOTAL: 0.6 mg/dL (ref 0.3–1.2)
BUN: 11 mg/dL (ref 8–23)
CALCIUM: 9.3 mg/dL (ref 8.9–10.3)
CO2: 29 mmol/L (ref 22–32)
Chloride: 108 mmol/L (ref 98–111)
Creatinine: 0.82 mg/dL (ref 0.61–1.24)
GFR, Est AFR Am: >60 mL/min (ref >60)
Glucose, Bld: 98 mg/dL (ref 70–99)
Potassium: 4.6 mmol/L (ref 3.5–5.1)
SODIUM: 144 mmol/L (ref 135–145)
Total Protein: 6.4 g/dL — ABNORMAL LOW (ref 6.5–8.1)

## 2017-12-18 LAB — CBC WITH DIFFERENTIAL/PLATELET
BASOS ABS: 0 10*3/uL (ref 0.0–0.1)
BASOS PCT: 0 %
Eosinophils Absolute: 0 10*3/uL (ref 0.0–0.5)
Eosinophils Relative: 0 %
HEMATOCRIT: 34.3 % — AB (ref 38.4–49.9)
Hemoglobin: 11.5 g/dL — ABNORMAL LOW (ref 13.0–17.1)
Lymphocytes Relative: 31 %
Lymphs Abs: 1 10*3/uL (ref 0.9–3.3)
MCH: 31.6 pg (ref 27.2–33.4)
MCHC: 33.5 g/dL (ref 32.0–36.0)
MCV: 94.2 fL (ref 79.3–98.0)
Monocytes Absolute: 0.6 10*3/uL (ref 0.1–0.9)
Monocytes Relative: 17 %
NEUTROS ABS: 1.7 10*3/uL (ref 1.5–6.5)
Neutrophils Relative %: 52 %
Platelets: 64 10*3/uL — ABNORMAL LOW (ref 140–400)
RBC: 3.64 MIL/uL — AB (ref 4.20–5.82)
RDW: 14.2 % (ref 11.0–14.6)
WBC: 3.2 10*3/uL — AB (ref 4.0–10.3)

## 2017-12-18 LAB — LACTATE DEHYDROGENASE: LDH: 166 U/L (ref 98–192)

## 2017-12-18 NOTE — Telephone Encounter (Signed)
Patient sent back to lab for stool cards and given avs report and appointments for February. [facility] will call re port removal.

## 2017-12-21 ENCOUNTER — Encounter: Payer: Self-pay | Admitting: Hematology

## 2017-12-22 ENCOUNTER — Inpatient Hospital Stay: Payer: BLUE CROSS/BLUE SHIELD

## 2017-12-22 DIAGNOSIS — D6489 Other specified anemias: Secondary | ICD-10-CM | POA: Diagnosis not present

## 2017-12-22 DIAGNOSIS — C8442 Peripheral T-cell lymphoma, not classified, intrathoracic lymph nodes: Secondary | ICD-10-CM | POA: Diagnosis not present

## 2017-12-22 DIAGNOSIS — D696 Thrombocytopenia, unspecified: Secondary | ICD-10-CM | POA: Diagnosis not present

## 2017-12-22 DIAGNOSIS — Z85820 Personal history of malignant melanoma of skin: Secondary | ICD-10-CM | POA: Diagnosis not present

## 2017-12-22 DIAGNOSIS — R197 Diarrhea, unspecified: Secondary | ICD-10-CM

## 2017-12-22 LAB — C DIFFICILE QUICK SCREEN W PCR REFLEX
C DIFFICILE (CDIFF) INTERP: NOT DETECTED
C Diff antigen: NEGATIVE
C Diff toxin: NEGATIVE

## 2017-12-23 LAB — GASTROINTESTINAL PANEL BY PCR, STOOL (REPLACES STOOL CULTURE)
ASTROVIRUS: DETECTED — AB
Adenovirus F40/41: NOT DETECTED
Campylobacter species: NOT DETECTED
Cryptosporidium: NOT DETECTED
Cyclospora cayetanensis: NOT DETECTED
ENTAMOEBA HISTOLYTICA: NOT DETECTED
ENTEROAGGREGATIVE E COLI (EAEC): NOT DETECTED
ENTEROTOXIGENIC E COLI (ETEC): NOT DETECTED
Enteropathogenic E coli (EPEC): NOT DETECTED
GIARDIA LAMBLIA: NOT DETECTED
NOROVIRUS GI/GII: NOT DETECTED
Plesimonas shigelloides: NOT DETECTED
Rotavirus A: NOT DETECTED
Salmonella species: NOT DETECTED
Sapovirus (I, II, IV, and V): NOT DETECTED
Shiga like toxin producing E coli (STEC): NOT DETECTED
Shigella/Enteroinvasive E coli (EIEC): NOT DETECTED
VIBRIO CHOLERAE: NOT DETECTED
Vibrio species: NOT DETECTED
Yersinia enterocolitica: NOT DETECTED

## 2018-01-01 ENCOUNTER — Other Ambulatory Visit: Payer: Self-pay | Admitting: Radiology

## 2018-01-05 ENCOUNTER — Other Ambulatory Visit: Payer: Self-pay

## 2018-01-05 ENCOUNTER — Ambulatory Visit (HOSPITAL_COMMUNITY)
Admission: RE | Admit: 2018-01-05 | Discharge: 2018-01-05 | Disposition: A | Discharge status: Home / Self Care | Payer: BLUE CROSS/BLUE SHIELD | Source: Ambulatory Visit | Attending: Hematology | Admitting: Hematology

## 2018-01-05 ENCOUNTER — Encounter (HOSPITAL_COMMUNITY): Payer: Self-pay

## 2018-01-05 DIAGNOSIS — J45909 Unspecified asthma, uncomplicated: Secondary | ICD-10-CM | POA: Insufficient documentation

## 2018-01-05 DIAGNOSIS — Z85820 Personal history of malignant melanoma of skin: Secondary | ICD-10-CM | POA: Diagnosis not present

## 2018-01-05 DIAGNOSIS — M1990 Unspecified osteoarthritis, unspecified site: Secondary | ICD-10-CM | POA: Diagnosis not present

## 2018-01-05 DIAGNOSIS — E7800 Pure hypercholesterolemia, unspecified: Secondary | ICD-10-CM | POA: Insufficient documentation

## 2018-01-05 DIAGNOSIS — C8442 Peripheral T-cell lymphoma, not classified, intrathoracic lymph nodes: Secondary | ICD-10-CM

## 2018-01-05 DIAGNOSIS — Z8572 Personal history of non-Hodgkin lymphomas: Secondary | ICD-10-CM | POA: Diagnosis not present

## 2018-01-05 DIAGNOSIS — G4730 Sleep apnea, unspecified: Secondary | ICD-10-CM | POA: Diagnosis not present

## 2018-01-05 DIAGNOSIS — K219 Gastro-esophageal reflux disease without esophagitis: Secondary | ICD-10-CM | POA: Insufficient documentation

## 2018-01-05 DIAGNOSIS — D696 Thrombocytopenia, unspecified: Secondary | ICD-10-CM

## 2018-01-05 DIAGNOSIS — Z880 Allergy status to penicillin: Secondary | ICD-10-CM | POA: Diagnosis not present

## 2018-01-05 DIAGNOSIS — M109 Gout, unspecified: Secondary | ICD-10-CM | POA: Insufficient documentation

## 2018-01-05 DIAGNOSIS — Z452 Encounter for adjustment and management of vascular access device: Secondary | ICD-10-CM | POA: Diagnosis not present

## 2018-01-05 HISTORY — PX: IR REMOVAL TUN ACCESS W/ PORT W/O FL MOD SED: IMG2290

## 2018-01-05 LAB — CBC
HCT: 36.8 % — ABNORMAL LOW (ref 39.0–52.0)
HEMOGLOBIN: 12.5 g/dL — AB (ref 13.0–17.0)
MCH: 31.5 pg (ref 26.0–34.0)
MCHC: 34 g/dL (ref 30.0–36.0)
MCV: 92.7 fL (ref 78.0–100.0)
Platelets: 65 10*3/uL — ABNORMAL LOW (ref 150–400)
RBC: 3.97 MIL/uL — AB (ref 4.22–5.81)
RDW: 14 % (ref 11.5–15.5)
WBC: 2.9 10*3/uL — ABNORMAL LOW (ref 4.0–10.5)

## 2018-01-05 LAB — PROTIME-INR
INR: 1.09
PROTHROMBIN TIME: 14.1 s (ref 11.4–15.2)

## 2018-01-05 MED ORDER — LIDOCAINE HCL 1 % IJ SOLN
INTRAMUSCULAR | Status: AC
  Filled 2018-01-05: qty 20

## 2018-01-05 MED ORDER — MIDAZOLAM HCL 2 MG/2ML IJ SOLN
INTRAMUSCULAR | Status: AC | PRN
  Administered 2018-01-05: 2 mg via INTRAVENOUS

## 2018-01-05 MED ORDER — VANCOMYCIN HCL IN DEXTROSE 1-5 GM/200ML-% IV SOLN
INTRAVENOUS | Status: AC
  Administered 2018-01-05: 1000 mg via INTRAVENOUS
  Filled 2018-01-05: qty 200

## 2018-01-05 MED ORDER — VANCOMYCIN HCL IN DEXTROSE 1-5 GM/200ML-% IV SOLN
1000.0000 mg | INTRAVENOUS | Status: AC
  Administered 2018-01-05: 1000 mg via INTRAVENOUS

## 2018-01-05 MED ORDER — SODIUM CHLORIDE 0.9 % IV SOLN
INTRAVENOUS | Status: AC
  Administered 2018-01-05: 12:00:00 via INTRAVENOUS

## 2018-01-05 MED ORDER — LIDOCAINE HCL (PF) 1 % IJ SOLN
INTRAMUSCULAR | Status: AC | PRN
  Administered 2018-01-05: 10 mL

## 2018-01-05 MED ORDER — MIDAZOLAM HCL 2 MG/2ML IJ SOLN
INTRAMUSCULAR | Status: AC
  Filled 2018-01-05: qty 4

## 2018-01-05 MED ORDER — FENTANYL CITRATE (PF) 100 MCG/2ML IJ SOLN
INTRAMUSCULAR | Status: AC
  Filled 2018-01-05: qty 2

## 2018-01-05 MED ORDER — FENTANYL CITRATE (PF) 100 MCG/2ML IJ SOLN
INTRAMUSCULAR | Status: AC | PRN
  Administered 2018-01-05 (×2): 50 ug via INTRAVENOUS

## 2018-01-05 NOTE — Discharge Instructions (Signed)
Implanted Port Removal, Care After °Refer to this sheet in the next few weeks. These instructions provide you with information about caring for yourself after your procedure. Your health care provider may also give you more specific instructions. Your treatment has been planned according to current medical practices, but problems sometimes occur. Call your health care provider if you have any problems or questions after your procedure. °What can I expect after the procedure? °After the procedure, it is common to have: °· Soreness or pain near your incision. °· Some swelling or bruising near your incision. ° °Follow these instructions at home: °Medicines °· Take over-the-counter and prescription medicines only as told by your health care provider. °· If you were prescribed an antibiotic medicine, take it as told by your health care provider. Do not stop taking the antibiotic even if you start to feel better. °Bathing °· Do not take baths, swim, or use a hot tub until your health care provider approves. Ask your health care provider if you can take showers. You may only be allowed to take sponge baths for bathing. °Incision care °· Follow instructions from your health care provider about how to take care of your incision. Make sure you: °? Wash your hands with soap and water before you change your bandage (dressing). If soap and water are not available, use hand sanitizer. °? Change your dressing as told by your health care provider. °? Keep your dressing dry. °? Leave stitches (sutures), skin glue, or adhesive strips in place. These skin closures may need to stay in place for 2 weeks or longer. If adhesive strip edges start to loosen and curl up, you may trim the loose edges. Do not remove adhesive strips completely unless your health care provider tells you to do that. °· Check your incision area every day for signs of infection. Check for: °? More redness, swelling, or pain. °? More fluid or  blood. °? Warmth. °? Pus or a bad smell. °Driving °· If you received a sedative, do not drive for 24 hours after the procedure. °· If you did not receive a sedative, ask your health care provider when it is safe to drive. °Activity °· Return to your normal activities as told by your health care provider. Ask your health care provider what activities are safe for you. °· Until your health care provider says it is safe: °? Do not lift anything that is heavier than 10 lb (4.5 kg). °? Do not do activities that involve lifting your arms over your head. °General instructions °· Do not use any tobacco products, such as cigarettes, chewing tobacco, and e-cigarettes. Tobacco can delay healing. If you need help quitting, ask your health care provider. °· Keep all follow-up visits as told by your health care provider. This is important. °Contact a health care provider if: °· You have more redness, swelling, or pain around your incision. °· You have more fluid or blood coming from your incision. °· Your incision feels warm to the touch. °· You have pus or a bad smell coming from your incision. °· You have a fever. °· You have pain that is not relieved by your pain medicine. °Get help right away if: °· You have chest pain. °· You have difficulty breathing. °This information is not intended to replace advice given to you by your health care provider. Make sure you discuss any questions you have with your health care provider. °Document Released: 04/02/2015 Document Revised: 09/27/2015 Document Reviewed: 01/24/2015 °Elsevier Interactive Patient   Education © 2018 [employer] °Moderate Conscious Sedation, Adult, Care After °These instructions provide you with information about caring for yourself after your procedure. Your health care provider may also give you more specific instructions. Your treatment has been planned according to current medical practices, but problems sometimes occur. Call your health care provider if you have  any problems or questions after your procedure. °What can I expect after the procedure? °After your procedure, it is common: °· To feel sleepy for several hours. °· To feel clumsy and have poor balance for several hours. °· To have poor judgment for several hours. °· To vomit if you eat too soon. ° °Follow these instructions at home: °For at least 24 hours after the procedure: ° °· Do not: °? Participate in activities where you could fall or become injured. °? Drive. °? Use heavy machinery. °? Drink alcohol. °? Take sleeping pills or medicines that cause drowsiness. °? Make important decisions or sign legal documents. °? Take care of children on your own. °· Rest. °Eating and drinking °· Follow the diet recommended by your health care provider. °· If you vomit: °? Drink water, juice, or soup when you can drink without vomiting. °? Make sure you have little or no nausea before eating solid foods. °General instructions °· Have a responsible adult stay with you until you are awake and alert. °· Take over-the-counter and prescription medicines only as told by your health care provider. °· If you smoke, do not smoke without supervision. °· Keep all follow-up visits as told by your health care provider. This is important. °Contact a health care provider if: °· You keep feeling nauseous or you keep vomiting. °· You feel light-headed. °· You develop a rash. °· You have a fever. °Get help right away if: °· You have trouble breathing. °This information is not intended to replace advice given to you by your health care provider. Make sure you discuss any questions you have with your health care provider. °Document Released: 02/09/2013 Document Revised: 09/24/2015 Document Reviewed: 08/11/2015 °Elsevier Interactive Patient Education © 2018 Elsevier Inc. ° °

## 2018-01-05 NOTE — Procedures (Signed)
Interventional Radiology Procedure Note  Procedure: removal of a right IJ approach single lumen PowerPort.  Complications: None Recommendations:  - Ok to shower tomorrow - Do not submerge for 7 days - Routine care   Signed,  Dulcy Fanny. Earleen Newport, DO

## 2018-01-05 NOTE — H&P (Signed)
Referring Physician(s): Brunetta Genera  Supervising Physician: Corrie Mckusick  Patient Status:  [facility] OP  Chief Complaint:  "I'm getting my port out"  Subjective: Patient familiar to IR service from prior Port-A-Cath placement on 02/17/2017 as well as bone marrow biopsy on 06/22/2017.  He has a history of T-cell lymphoma as well as remote melanoma.  He has completed treatment and presents today for Port-A-Cath removal.  He currently denies fever, headache, chest pain, dyspnea, cough, abdominal/back pain, nausea, vomiting or bleeding.  Past Medical History:  Diagnosis Date  . Allergic rhinitis   . Asthma    slight  . Bell's palsy   . GERD (gastroesophageal reflux disease)   . Gout   . Hypercholesteremia   . Melanoma ([place])   . Osteoarthritis   . Sleep apnea    Resolving  . Thrombocytopenia ([place])   . Tubular adenoma    Past Surgical History:  Procedure Laterality Date  . IR FLUORO GUIDE PORT INSERTION RIGHT  02/17/2017  . IR US GUIDE VASC ACCESS RIGHT  02/17/2017  . KNEE SURGERY  1993  . MELANOMA EXCISION    . NASAL RECONSTRUCTION    . VASECTOMY  1999      Allergies: Penicillins  Medications: Prior to Admission medications   Medication Sig Start Date End Date Taking? Authorizing Provider  acyclovir (ZOVIRAX) 800 MG tablet Take 800 mg by mouth 2 (two) times daily.   Yes [provider]  albuterol (PROVENTIL HFA;VENTOLIN HFA) 108 (90 Base) MCG/ACT inhaler Inhale 1-2 puffs into the lungs every 6 (six) hours as needed for wheezing or shortness of breath.   Yes [provider]  allopurinol (ZYLOPRIM) 100 MG tablet Take 100 mg by mouth 2 (two) times daily. 07/20/15  Yes [provider]  carvedilol (COREG) 3.125 MG tablet TAKE 1 TABLET BY MOUTH TWICE DAILY WITH A MEAL 09/21/17  Yes Larey Dresser, MD  desloratadine (CLARINEX) 5 MG tablet Take 5 mg by mouth daily.   Yes [provider]  folic acid (FOLVITE) 1 MG tablet Take 1 mg by  mouth daily. 08/16/17  Yes [provider]  mometasone-formoterol (DULERA) 100-5 MCG/ACT AERO Inhale 2 puffs into the lungs 2 (two) times daily. 01/27/17  Yes Tanda Rockers, MD  pantoprazole (PROTONIX) 40 MG tablet Take 40 mg by mouth 2 (two) times daily.    Yes [provider]  sulfamethoxazole-trimethoprim (BACTRIM DS,SEPTRA DS) 800-160 MG tablet Take 1 tablet by mouth. Every,monday, Wednesday, and Friday 08/17/17  Yes [provider]  traZODone (DESYREL) 50 MG tablet Take 1-2 tablets (50-100 mg total) by mouth at bedtime. 09/07/17  Yes Brunetta Genera, MD     Vital Signs: BP 114/74 (BP Location: Left Arm)   Pulse 66   Temp 97.8 F (36.6 C) (Oral)   Resp 18   SpO2 99%   Physical Exam awake, alert.  Chest clear to auscultation bilaterally.  Clean, intact right chest wall Port-A-Cath.  Heart with regular rate and rhythm.  Abdomen soft, positive bowel sounds, nontender.  No lower extremity edema.  Imaging: No results found.  Labs:  CBC: Recent Labs    08/31/17 1249 09/14/17 0835 10/15/17 0955 12/18/17 1022  WBC 7.7 11.2* 8.4 3.2*  HGB 10.0* 9.7* 10.6* 11.5*  HCT 30.5* 29.6* 32.0* 34.3*  PLT 19* 59* 51* 64*    COAGS: Recent Labs    02/17/17 0930 06/22/17 0712 01/05/18 1159  INR 1.09 1.05 1.09    BMP: Recent Labs  08/31/17 1249 09/14/17 0835 10/15/17 0955 12/18/17 1022  NA 138 140 140 144  K 4.4 3.8 3.5 4.6  CL 104 105 106 108  CO2 '27 26 26 29  '$ GLUCOSE 117 216* 117 98  BUN 6* '11 7 11  '$ CALCIUM 9.5 9.6 9.2 9.3  CREATININE 0.73 0.78 0.76 0.82  GFRNONAA >60 >60 >60 >60  GFRAA >60 >60 >60 >60    LIVER FUNCTION TESTS: Recent Labs    08/31/17 1249 09/14/17 0835 10/15/17 0955 12/18/17 1022  BILITOT 0.6 0.4 0.5 0.6  AST '20 19 21 23  '$ ALT '19 22 16 21  '$ ALKPHOS 177* 201* 158* 123  PROT 6.1* 6.3* 6.2* 6.4*  ALBUMIN 3.7 3.6 4.2 4.2    Assessment and Plan: Pt with history of T-cell lymphoma as well as remote melanoma.  He has  completed treatment and presents today for Port-A-Cath removal.  Details/risks of procedure, including but not limited to, internal bleeding, infection, injury to adjacent structures discussed with patient/spouse with their understanding and consent.  Electronically Signed: D. Rowe Robert, PA-C 01/05/2018, 1:02 PM   I spent a total of 20 minutes at the the patient's bedside AND on the patient's hospital floor or unit, greater than 50% of which was counseling/coordinating care for Port-A-Cath removal

## 2018-01-25 ENCOUNTER — Other Ambulatory Visit (HOSPITAL_COMMUNITY): Payer: Self-pay | Admitting: Cardiology

## 2018-02-03 DIAGNOSIS — C8590 Non-Hodgkin lymphoma, unspecified, unspecified site: Secondary | ICD-10-CM | POA: Diagnosis not present

## 2018-02-04 DIAGNOSIS — K769 Liver disease, unspecified: Secondary | ICD-10-CM | POA: Diagnosis not present

## 2018-02-04 DIAGNOSIS — Z9484 Stem cells transplant status: Secondary | ICD-10-CM | POA: Diagnosis not present

## 2018-02-04 DIAGNOSIS — E119 Type 2 diabetes mellitus without complications: Secondary | ICD-10-CM | POA: Diagnosis not present

## 2018-02-04 DIAGNOSIS — Z880 Allergy status to penicillin: Secondary | ICD-10-CM | POA: Diagnosis not present

## 2018-02-04 DIAGNOSIS — Z48298 Encounter for aftercare following other organ transplant: Secondary | ICD-10-CM | POA: Diagnosis not present

## 2018-02-04 DIAGNOSIS — D61818 Other pancytopenia: Secondary | ICD-10-CM | POA: Diagnosis not present

## 2018-02-04 DIAGNOSIS — D649 Anemia, unspecified: Secondary | ICD-10-CM | POA: Diagnosis not present

## 2018-02-04 DIAGNOSIS — Z23 Encounter for immunization: Secondary | ICD-10-CM | POA: Diagnosis not present

## 2018-02-04 DIAGNOSIS — C8590 Non-Hodgkin lymphoma, unspecified, unspecified site: Secondary | ICD-10-CM | POA: Diagnosis not present

## 2018-02-04 DIAGNOSIS — C8448 Peripheral T-cell lymphoma, not classified, lymph nodes of multiple sites: Secondary | ICD-10-CM | POA: Diagnosis not present

## 2018-02-04 DIAGNOSIS — D696 Thrombocytopenia, unspecified: Secondary | ICD-10-CM | POA: Diagnosis not present

## 2018-02-04 DIAGNOSIS — R918 Other nonspecific abnormal finding of lung field: Secondary | ICD-10-CM | POA: Diagnosis not present

## 2018-02-04 DIAGNOSIS — I10 Essential (primary) hypertension: Secondary | ICD-10-CM | POA: Diagnosis not present

## 2018-02-04 DIAGNOSIS — E785 Hyperlipidemia, unspecified: Secondary | ICD-10-CM | POA: Diagnosis not present

## 2018-02-16 DIAGNOSIS — L821 Other seborrheic keratosis: Secondary | ICD-10-CM | POA: Diagnosis not present

## 2018-02-16 DIAGNOSIS — L819 Disorder of pigmentation, unspecified: Secondary | ICD-10-CM | POA: Diagnosis not present

## 2018-02-16 DIAGNOSIS — L570 Actinic keratosis: Secondary | ICD-10-CM | POA: Diagnosis not present

## 2018-02-16 DIAGNOSIS — D1801 Hemangioma of skin and subcutaneous tissue: Secondary | ICD-10-CM | POA: Diagnosis not present

## 2018-02-16 DIAGNOSIS — L814 Other melanin hyperpigmentation: Secondary | ICD-10-CM | POA: Diagnosis not present

## 2018-02-18 DIAGNOSIS — C8448 Peripheral T-cell lymphoma, not classified, lymph nodes of multiple sites: Secondary | ICD-10-CM | POA: Diagnosis not present

## 2018-02-18 DIAGNOSIS — Z48298 Encounter for aftercare following other organ transplant: Secondary | ICD-10-CM | POA: Diagnosis not present

## 2018-02-18 DIAGNOSIS — Z23 Encounter for immunization: Secondary | ICD-10-CM | POA: Diagnosis not present

## 2018-03-10 DIAGNOSIS — M1712 Unilateral primary osteoarthritis, left knee: Secondary | ICD-10-CM | POA: Diagnosis not present

## 2018-03-12 ENCOUNTER — Other Ambulatory Visit: Payer: Self-pay | Admitting: Internal Medicine

## 2018-03-15 ENCOUNTER — Other Ambulatory Visit: Payer: Self-pay | Admitting: Internal Medicine

## 2018-03-15 MED ORDER — MOMETASONE FURO-FORMOTEROL FUM 100-5 MCG/ACT IN AERO: 2.0000 | INHALATION_SPRAY | Freq: Two times a day (BID) | RESPIRATORY_TRACT | 0 refills | Status: DC

## 2018-04-05 DIAGNOSIS — Z23 Encounter for immunization: Secondary | ICD-10-CM | POA: Diagnosis not present

## 2018-04-05 DIAGNOSIS — C8448 Peripheral T-cell lymphoma, not classified, lymph nodes of multiple sites: Secondary | ICD-10-CM | POA: Diagnosis not present

## 2018-04-08 DIAGNOSIS — M1712 Unilateral primary osteoarthritis, left knee: Secondary | ICD-10-CM | POA: Diagnosis not present

## 2018-04-15 ENCOUNTER — Other Ambulatory Visit: Payer: Self-pay

## 2018-04-15 ENCOUNTER — Ambulatory Visit (HOSPITAL_COMMUNITY): Payer: BLUE CROSS/BLUE SHIELD | Attending: Cardiovascular Disease

## 2018-04-15 DIAGNOSIS — M1712 Unilateral primary osteoarthritis, left knee: Secondary | ICD-10-CM | POA: Diagnosis not present

## 2018-04-15 DIAGNOSIS — R0789 Other chest pain: Secondary | ICD-10-CM | POA: Diagnosis not present

## 2018-04-16 ENCOUNTER — Telehealth (HOSPITAL_COMMUNITY): Payer: Self-pay

## 2018-04-16 NOTE — Telephone Encounter (Signed)
Pt called and given echo results  appointment to see [facility] scheduled for 12/30 at 9:40am

## 2018-04-16 NOTE — Telephone Encounter (Signed)
Pt unable to keep appointment in December will be out of town, will call back to schedule an appointment

## 2018-04-22 DIAGNOSIS — M1712 Unilateral primary osteoarthritis, left knee: Secondary | ICD-10-CM | POA: Diagnosis not present

## 2018-04-27 ENCOUNTER — Other Ambulatory Visit: Payer: Self-pay | Admitting: Internal Medicine

## 2018-05-03 ENCOUNTER — Other Ambulatory Visit: Payer: Self-pay | Admitting: Hematology

## 2018-05-03 ENCOUNTER — Encounter (HOSPITAL_COMMUNITY): Payer: BLUE CROSS/BLUE SHIELD | Admitting: Cardiology

## 2018-05-03 DIAGNOSIS — C8498 Mature T/NK-cell lymphomas, unspecified, lymph nodes of multiple sites: Secondary | ICD-10-CM

## 2018-05-04 DIAGNOSIS — J029 Acute pharyngitis, unspecified: Secondary | ICD-10-CM | POA: Diagnosis not present

## 2018-05-08 DIAGNOSIS — C8590 Non-Hodgkin lymphoma, unspecified, unspecified site: Secondary | ICD-10-CM | POA: Diagnosis not present

## 2018-05-11 DIAGNOSIS — I10 Essential (primary) hypertension: Secondary | ICD-10-CM | POA: Diagnosis not present

## 2018-05-11 DIAGNOSIS — C8448 Peripheral T-cell lymphoma, not classified, lymph nodes of multiple sites: Secondary | ICD-10-CM | POA: Diagnosis not present

## 2018-05-11 DIAGNOSIS — E119 Type 2 diabetes mellitus without complications: Secondary | ICD-10-CM | POA: Diagnosis not present

## 2018-05-11 DIAGNOSIS — Z23 Encounter for immunization: Secondary | ICD-10-CM | POA: Diagnosis not present

## 2018-05-11 DIAGNOSIS — D696 Thrombocytopenia, unspecified: Secondary | ICD-10-CM | POA: Diagnosis not present

## 2018-05-11 DIAGNOSIS — Z79899 Other long term (current) drug therapy: Secondary | ICD-10-CM | POA: Diagnosis not present

## 2018-05-11 DIAGNOSIS — R197 Diarrhea, unspecified: Secondary | ICD-10-CM | POA: Diagnosis not present

## 2018-05-11 DIAGNOSIS — D649 Anemia, unspecified: Secondary | ICD-10-CM | POA: Diagnosis not present

## 2018-05-11 DIAGNOSIS — Z9484 Stem cells transplant status: Secondary | ICD-10-CM | POA: Diagnosis not present

## 2018-06-17 NOTE — Progress Notes (Signed)
HEMATOLOGY/ONCOLOGY CLINIC NOTE  Date of Service: 06/18/18   Patient Care Team: Hulan Fess, MD as PCP - General (Family Medicine)  CHIEF COMPLAINTS/PURPOSE OF CONSULTATION:   F/u for high gradeT cell NHL  CURRENT THERAPY: post BMT active surveillance  Previous Treatment; R-CHOEP x 6 cycles BEAM -Auto HSCT  HISTORY OF PRESENTING ILLNESS:  See previous note for details  INTERVAL HISTORY   Joseph Powell is a wonderful 63 y.o. male here for his scheduled followup after his transplant on 08/05/17. The patient's last visit with [place] was on 12/18/17. He is accompanied today by his wife. The pt reports that he is doing well overall.   The pt reports that he is now 317 days post transplant. He notes that he is enjoying life and is doing all that he wants to do. He notes that he had a bronchial infection in December which resolved after treatment with an antibiotic course. He notes that he has had some phlegm over the last 2-3 days without fever. He endorses good energy levels and notes that his weight has been stable.   The pt notes that he continues on '800mg'$  Acyclovir BID which he will continue until August 06, 2018. He is taking '100mg'$  Allopurinol every other day and cut back on Protonix. He is taking a multivitamin. He is no longer taking folic acid. The pt notes that his last gout attack was a few years ago.   The pt notes that he has continued with his vaccinations at [facility] with his transplant team.  Lab results today (06/18/18) of CBC w/diff and CMP is as follows: all values are WNL except for PLT at 78k, Monocytes abs at 1.2k, Abs immature granulocytes at 0.09k, Glucose at 110. 06/18/18 LDH is at 158  On review of systems, pt reports good energy levels, eating well, stable weight, staying active, and denies concerns for present infection, fevers, chills, and abdominal pains, leg swelling, and any other symptoms.   MEDICAL HISTORY:  Past Medical History:  Diagnosis Date  .  Allergic rhinitis   . Asthma    slight  . Bell's palsy   . GERD (gastroesophageal reflux disease)   . Gout   . Hypercholesteremia   . Melanoma ([place])   . Osteoarthritis   . Sleep apnea    Resolving  . Thrombocytopenia ([place])   . Tubular adenoma     SURGICAL HISTORY: Past Surgical History:  Procedure Laterality Date  . IR FLUORO GUIDE PORT INSERTION RIGHT  02/17/2017  . IR REMOVAL TUN ACCESS W/ PORT W/O FL MOD SED  01/05/2018  . IR US GUIDE VASC ACCESS RIGHT  02/17/2017  . KNEE SURGERY  1993  . MELANOMA EXCISION    . NASAL RECONSTRUCTION    . VASECTOMY  1999    SOCIAL HISTORY: Social History   Socioeconomic History  . Marital status: Married    Spouse name: Not on file  . Number of children: Not on file  . Years of education: Not on file  . Highest education level: Not on file  Occupational History  . Occupation: Risk analyst  Social Needs  . Financial resource strain: Not on file  . Food insecurity:    Worry: Not on file    Inability: Not on file  . Transportation needs:    Medical: Not on file    Non-medical: Not on file  Tobacco Use  . Smoking status: Never Smoker  . Smokeless tobacco: Never Used  Substance and Sexual Activity  .  Alcohol use: Yes    Alcohol/week: 2.0 standard drinks    Types: 2 Standard drinks or equivalent per week    Comment: socially  . Drug use: No  . Sexual activity: Not on file  Lifestyle  . Physical activity:    Days per week: Not on file    Minutes per session: Not on file  . Stress: Not on file  Relationships  . Social connections:    Talks on phone: Not on file    Gets together: Not on file    Attends religious service: Not on file    Active member of club or organization: Not on file    Attends meetings of clubs or organizations: Not on file    Relationship status: Not on file  . Intimate partner violence:    Fear of current or ex partner: Not on file    Emotionally abused: Not on file    Physically abused: Not on  file    Forced sexual activity: Not on file  Other Topics Concern  . Not on file  Social History Narrative  . Not on file    FAMILY HISTORY: Family History  Problem Relation Age of Onset  . Diabetes Mother   . Asthma Mother   . Gout Brother   . Colon cancer Paternal Uncle   . Esophageal cancer Neg Hx   . Stomach cancer Neg Hx     ALLERGIES:  is allergic to penicillins.  MEDICATIONS:  Current Outpatient Medications  Medication Sig Dispense Refill  . acyclovir (ZOVIRAX) 800 MG tablet Take 800 mg by mouth 2 (two) times daily.    Marland Kitchen albuterol (PROVENTIL HFA;VENTOLIN HFA) 108 (90 Base) MCG/ACT inhaler Inhale 1-2 puffs into the lungs every 6 (six) hours as needed for wheezing or shortness of breath.    . allopurinol (ZYLOPRIM) 100 MG tablet Take 100 mg by mouth 2 (two) times daily.    . carvedilol (COREG) 3.125 MG tablet TAKE 1 TABLET BY MOUTH TWICE DAILY WITH A MEAL 60 tablet 5  . desloratadine (CLARINEX) 5 MG tablet Take 5 mg by mouth daily.    . DULERA 100-5 MCG/ACT AERO INHALE 2 PUFFS BY MOUTH TWICE DAILY 13 g 2  . folic acid (FOLVITE) 1 MG tablet Take 1 mg by mouth daily.    . pantoprazole (PROTONIX) 40 MG tablet Take 40 mg by mouth 2 (two) times daily.     Marland Kitchen sulfamethoxazole-trimethoprim (BACTRIM DS,SEPTRA DS) 800-160 MG tablet Take 1 tablet by mouth. Every,monday, Wednesday, and Friday    . traZODone (DESYREL) 50 MG tablet TAKE 1 TO 2 TABLETS BY MOUTH AT BEDTIME 60 tablet 0   Current Facility-Administered Medications  Medication Dose Route Frequency Provider Last Rate Last Dose  . 0.9 %  sodium chloride infusion  500 mL Intravenous Continuous Milus Banister, MD        REVIEW OF SYSTEMS:    A 10+ POINT REVIEW OF SYSTEMS WAS OBTAINED including neurology, dermatology, psychiatry, cardiac, respiratory, lymph, extremities, GI, GU, Musculoskeletal, constitutional, breasts, reproductive, HEENT.  All pertinent positives are noted in the HPI.  All others are negative.   PHYSICAL  EXAMINATION: ECOG PERFORMANCE STATUS: 1 - Symptomatic but completely ambulatory  Vitals:   06/18/18 1018  BP: 122/80  Pulse: 66  Resp: 18  Temp: (!) 97.4 F (36.3 C)  SpO2: 100%   Filed Weights   06/18/18 1018  Weight: 188 lb 1.6 oz (85.3 kg)   .Body mass index is 26.23 kg/m.  GENERAL:alert, in no acute distress and comfortable SKIN: no acute rashes, no significant lesions EYES: conjunctiva are pink and non-injected, sclera anicteric OROPHARYNX: MMM, no exudates, no oropharyngeal erythema or ulceration NECK: supple, no JVD LYMPH:  no palpable lymphadenopathy in the cervical, axillary or inguinal regions LUNGS: clear to auscultation b/l with normal respiratory effort HEART: regular rate & rhythm ABDOMEN:  normoactive bowel sounds , non tender, not distended. No palpable hepatosplenomegaly.  Extremity: no pedal edema PSYCH: alert & oriented x 3 with fluent speech NEURO: no focal motor/sensory deficits   LABORATORY DATA:  I have reviewed the data as listed  . CBC Latest Ref Rng & Units 06/18/2018 01/05/2018 12/18/2017  WBC 4.0 - 10.5 K/uL 5.2 2.9(L) 3.2(L)  Hemoglobin 13.0 - 17.0 g/dL 13.4 12.5(L) 11.5(L)  Hematocrit 39.0 - 52.0 % 39.6 36.8(L) 34.3(L)  Platelets 150 - 400 K/uL 78(L) 65(L) 64(L)  ANC 8.6k . CMP Latest Ref Rng & Units 06/18/2018 12/18/2017 10/15/2017  Glucose 70 - 99 mg/dL 110(H) 98 117  BUN 8 - 23 mg/dL '15 11 7  '$ Creatinine 0.61 - 1.24 mg/dL 0.84 0.82 0.76  Sodium 135 - 145 mmol/L 141 144 140  Potassium 3.5 - 5.1 mmol/L 4.0 4.6 3.5  Chloride 98 - 111 mmol/L 106 108 106  CO2 22 - 32 mmol/L '26 29 26  '$ Calcium 8.9 - 10.3 mg/dL 9.4 9.3 9.2  Total Protein 6.5 - 8.1 g/dL 6.7 6.4(L) 6.2(L)  Total Bilirubin 0.3 - 1.2 mg/dL 0.7 0.6 0.5  Alkaline Phos 38 - 126 U/L 100 123 158(H)  AST 15 - 41 U/L '19 23 21  '$ ALT 0 - 44 U/L '19 21 16   '$ Uric acid 3.1    06/22/17 Cytogenetics Report:   06/22/17 Flow Cytometry:    06/22/17 BM Bx:    RADIOGRAPHIC STUDIES:  I  have personally reviewed the radiological images as listed and agreed with the findings in the report. No results found.   11/13/17 PET/CT    ASSESSMENT & PLAN:    Joseph Powell is a 63 y.o. 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 Patient has some chronic thrombocytopenia 85-125k for a few years. Platelets upon initial visit are down to 36k in the setting of newly diagnosed lymphoma and treatment with R-CHOEP and from hypersplenism but then improved. PLt counts had normalized after treatment of the lymphoma pre-transplant - likely related to post-transplant meds -Acyclovir/allopurinol vs delayed post-transplant recovery  #3 Elevated ALK PO4 - stable - likely from ctx/neulasta  #4 Post transplant Anemia -- hgb improved from 9.7 to 10.6  # 5 Mild neutropenia - resolved with Neulasta  #6 post transplant Antimicrobial prophylaxis - per transplant team.  PLAN: -Discussed pt labwork today, 06/18/18; WBC normalized to 5.2k, HGB normalized to 13.4, PLT still low but increased to 78k. Chemistries are normal. LDH is normal at 158. -Suspect thrombocytopenia could be a medication effect from '800mg'$  Acyclovir BID, which he will continue on until his one year post transplant mark of 08/06/18 -Advised continuing to wean off Omeprazole and Allopurinol to help improve platelets as well -The pt shows no clinical or lab progression/return of his peripheral T-Cell lymphoma at this time. -No indication for further treatment at this time. -Will allow transplant team to determine  length of Bactrim and Acyclovir course -Continue with appropriate infection prevention strategies -Continue Vitamin B complex -he is maintaining 3 month follow ups with his  transplant team -Will see the pt back in 6 months with labs  #7 History of Melanoma, 2009 -Followed by Dr. Allyson Sabal for continued skin screening   #8 Patient Active Problem List   Diagnosis Date Noted  . Pulmonary infiltrates 06/24/2017  . Peripheral T cell lymphoma of intrathoracic lymph nodes ([place]) 03/11/2017  . Counseling regarding advanced care planning and goals of care 02/12/2017  . Mature T/NK-cell lymphomas, unspecified, lymph nodes of multiple sites ([place]) 02/10/2017  . Cough variant asthma vs UACS 01/27/2017  . Dyslipidemia 08/05/2016  . Mild asthma 08/05/2016  . DJD (degenerative joint disease) 08/05/2016  . HLD (hyperlipidemia) 07/27/2016  . GERD (gastroesophageal reflux disease) 07/27/2016  . Gout 07/27/2016  . Discomfort in chest 07/27/2016  . OSA (obstructive sleep apnea) 09/19/2013  . Thrombocytopenia ([place]) 04/14/2011  . SCHATZKI'S RING 08/21/2009   #9 Left sided jaw pain resolved   RTC with Dr Irene Limbo with labs in 6 months   All of the patients questions were answered with apparent satisfaction. The patient knows to call the clinic with any problems, questions or concerns.  The total time spent in the appt was 25 minutes and more than 50% was on counseling and direct patient cares.    Sullivan Lone MD [place] AAHIVMS [facility] Hematology/Oncology Physician [facility]  (Office):       [phone] (Work cell):  [phone] (Fax):           [phone]  I, Baldwin [place], am acting as a scribe for Dr. Sullivan Lone.   .I have reviewed the above documentation for accuracy and completeness, and I agree with the above. Brunetta Genera MD

## 2018-06-18 ENCOUNTER — Telehealth: Payer: Self-pay | Admitting: Hematology

## 2018-06-18 ENCOUNTER — Inpatient Hospital Stay: Payer: BLUE CROSS/BLUE SHIELD

## 2018-06-18 ENCOUNTER — Inpatient Hospital Stay: Payer: BLUE CROSS/BLUE SHIELD | Attending: Hematology | Admitting: Hematology

## 2018-06-18 VITALS — BP 122/80 | HR 66 | Temp 97.4°F | Resp 18 | Ht 71.0 in | Wt 188.1 lb

## 2018-06-18 DIAGNOSIS — D696 Thrombocytopenia, unspecified: Secondary | ICD-10-CM

## 2018-06-18 DIAGNOSIS — C8442 Peripheral T-cell lymphoma, not classified, intrathoracic lymph nodes: Secondary | ICD-10-CM | POA: Insufficient documentation

## 2018-06-18 DIAGNOSIS — D709 Neutropenia, unspecified: Secondary | ICD-10-CM | POA: Diagnosis not present

## 2018-06-18 DIAGNOSIS — D649 Anemia, unspecified: Secondary | ICD-10-CM

## 2018-06-18 LAB — CMP (CANCER CENTER ONLY)
ALK PHOS: 100 U/L (ref 38–126)
ALT: 19 U/L (ref 0–44)
ANION GAP: 9 (ref 5–15)
AST: 19 U/L (ref 15–41)
Albumin: 4.5 g/dL (ref 3.5–5.0)
BILIRUBIN TOTAL: 0.7 mg/dL (ref 0.3–1.2)
BUN: 15 mg/dL (ref 8–23)
CALCIUM: 9.4 mg/dL (ref 8.9–10.3)
CO2: 26 mmol/L (ref 22–32)
Chloride: 106 mmol/L (ref 98–111)
Creatinine: 0.84 mg/dL (ref 0.61–1.24)
GFR, Est AFR Am: >60 mL/min (ref >60)
Glucose, Bld: 110 mg/dL — ABNORMAL HIGH (ref 70–99)
POTASSIUM: 4 mmol/L (ref 3.5–5.1)
SODIUM: 141 mmol/L (ref 135–145)
TOTAL PROTEIN: 6.7 g/dL (ref 6.5–8.1)

## 2018-06-18 LAB — CBC WITH DIFFERENTIAL/PLATELET
ABS IMMATURE GRANULOCYTES: 0.09 10*3/uL — AB (ref 0.00–0.07)
BASOS PCT: 0 %
Basophils Absolute: 0 10*3/uL (ref 0.0–0.1)
EOS ABS: 0.1 10*3/uL (ref 0.0–0.5)
Eosinophils Relative: 2 %
HCT: 39.6 % (ref 39.0–52.0)
Hemoglobin: 13.4 g/dL (ref 13.0–17.0)
IMMATURE GRANULOCYTES: 2 %
Lymphocytes Relative: 14 %
Lymphs Abs: 0.7 10*3/uL (ref 0.7–4.0)
MCH: 31.5 pg (ref 26.0–34.0)
MCHC: 33.8 g/dL (ref 30.0–36.0)
MCV: 93.2 fL (ref 80.0–100.0)
Monocytes Absolute: 1.2 10*3/uL — ABNORMAL HIGH (ref 0.1–1.0)
Monocytes Relative: 24 %
NEUTROS ABS: 3 10*3/uL (ref 1.7–7.7)
NEUTROS PCT: 58 %
PLATELETS: 78 10*3/uL — AB (ref 150–400)
RBC: 4.25 MIL/uL (ref 4.22–5.81)
RDW: 13.8 % (ref 11.5–15.5)
WBC: 5.2 10*3/uL (ref 4.0–10.5)
nRBC: 0 % (ref 0.0–0.2)

## 2018-06-18 LAB — LACTATE DEHYDROGENASE: LDH: 158 U/L (ref 98–192)

## 2018-06-18 NOTE — Telephone Encounter (Signed)
Scheduled appt per 2/14 los.  Printed calendar and avs.

## 2018-06-19 ENCOUNTER — Other Ambulatory Visit: Payer: Self-pay | Admitting: Nurse Practitioner

## 2018-06-30 ENCOUNTER — Telehealth: Payer: Self-pay | Admitting: *Deleted

## 2018-06-30 NOTE — Telephone Encounter (Signed)
Medical Clearance Form for [facility] signed by [name]. Faxed to [facility] @ [phone]. Fax confirmation received

## 2018-07-24 ENCOUNTER — Encounter (HOSPITAL_COMMUNITY): Payer: Self-pay

## 2018-07-31 ENCOUNTER — Other Ambulatory Visit (HOSPITAL_COMMUNITY): Payer: Self-pay | Admitting: Cardiology

## 2018-07-31 ENCOUNTER — Other Ambulatory Visit: Payer: Self-pay | Admitting: Hematology

## 2018-07-31 DIAGNOSIS — C8498 Mature T/NK-cell lymphomas, unspecified, lymph nodes of multiple sites: Secondary | ICD-10-CM

## 2018-08-02 ENCOUNTER — Encounter (HOSPITAL_COMMUNITY): Payer: BLUE CROSS/BLUE SHIELD | Admitting: Cardiology

## 2018-09-09 DIAGNOSIS — K7689 Other specified diseases of liver: Secondary | ICD-10-CM | POA: Diagnosis not present

## 2018-09-09 DIAGNOSIS — D696 Thrombocytopenia, unspecified: Secondary | ICD-10-CM | POA: Diagnosis not present

## 2018-09-09 DIAGNOSIS — R197 Diarrhea, unspecified: Secondary | ICD-10-CM | POA: Diagnosis not present

## 2018-09-09 DIAGNOSIS — Z23 Encounter for immunization: Secondary | ICD-10-CM | POA: Diagnosis not present

## 2018-09-09 DIAGNOSIS — Z9484 Stem cells transplant status: Secondary | ICD-10-CM | POA: Diagnosis not present

## 2018-09-09 DIAGNOSIS — C8448 Peripheral T-cell lymphoma, not classified, lymph nodes of multiple sites: Secondary | ICD-10-CM | POA: Diagnosis not present

## 2018-09-09 DIAGNOSIS — C8590 Non-Hodgkin lymphoma, unspecified, unspecified site: Secondary | ICD-10-CM | POA: Diagnosis not present

## 2018-09-22 DIAGNOSIS — M25572 Pain in left ankle and joints of left foot: Secondary | ICD-10-CM | POA: Diagnosis not present

## 2018-09-22 DIAGNOSIS — M7662 Achilles tendinitis, left leg: Secondary | ICD-10-CM | POA: Diagnosis not present

## 2018-10-14 IMAGING — CT CT ASPRIRATION BONE MARROW
1 of 2 series · 12 of 16 positions shown, 15 images · non-contrast
Comparison: none

INDICATION: 61-year-old male with a history of peripheral T-cell lymphoma now
status post induction chemotherapy. He presents for bone marrow
biopsy prior to initiating stem cell transplantation protocol.

[Series 2: i-spiral 5.0 b40f · axial · 0.98mm/px · z∈[-68,-9]mm · 12 of 21 slices shown, 15 images]
[im 2/21  soft-tissue]
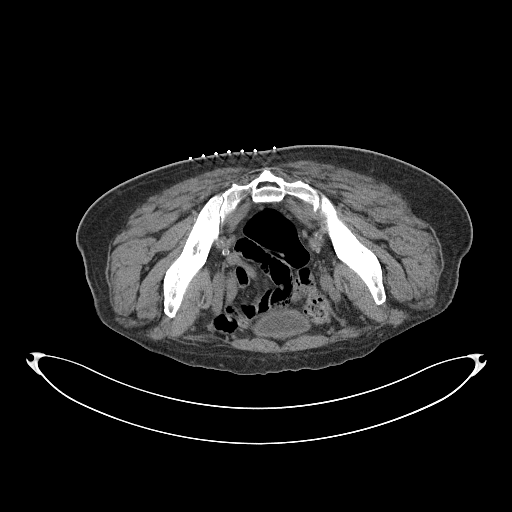
[im 2/21  bone]
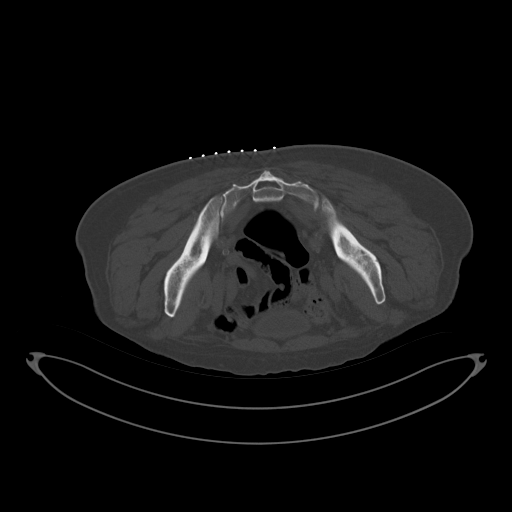
[im 4/21  soft-tissue]
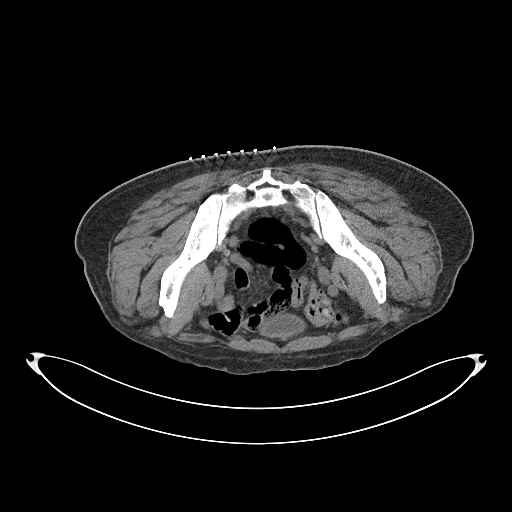
[im 5/21  soft-tissue]
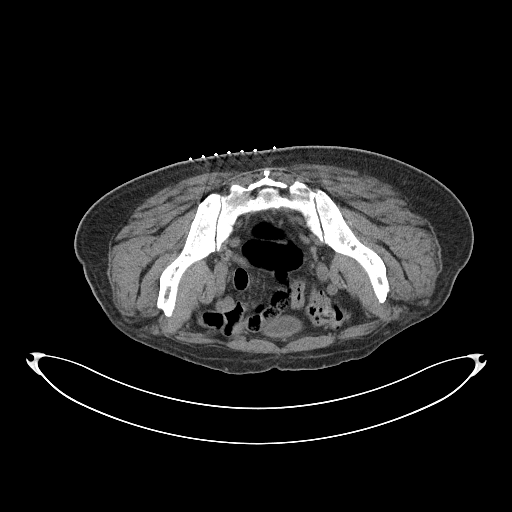
[im 7/21  soft-tissue]
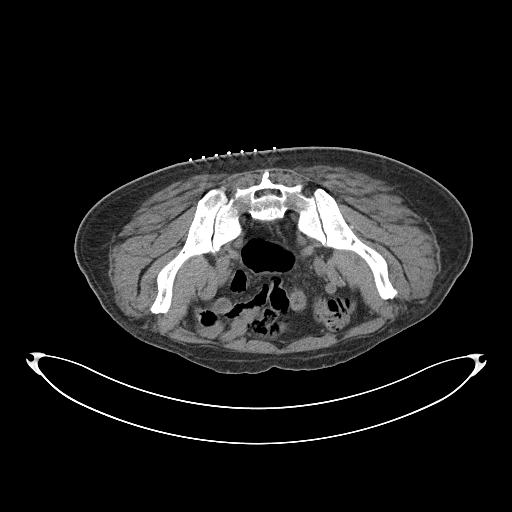
[im 8/21  soft-tissue]
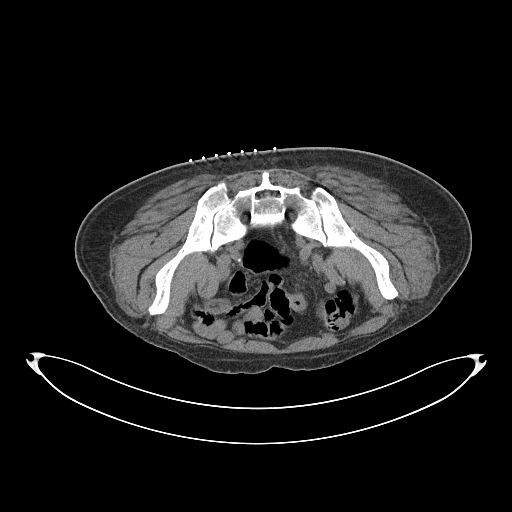
[im 8/21  bone]
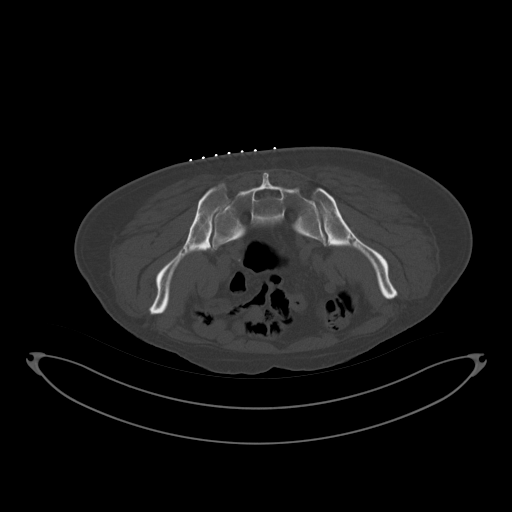
[im 10/21  soft-tissue]
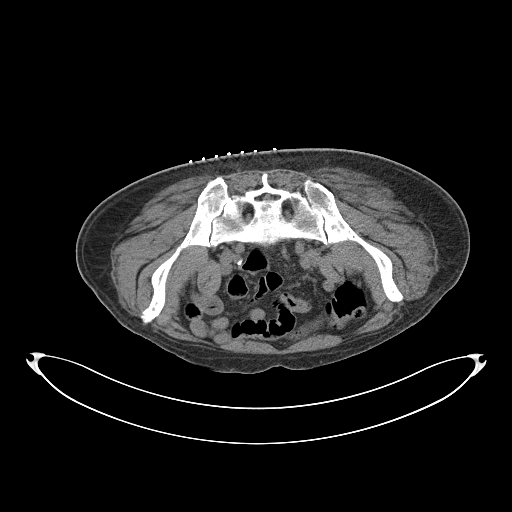
[im 11/21  soft-tissue]
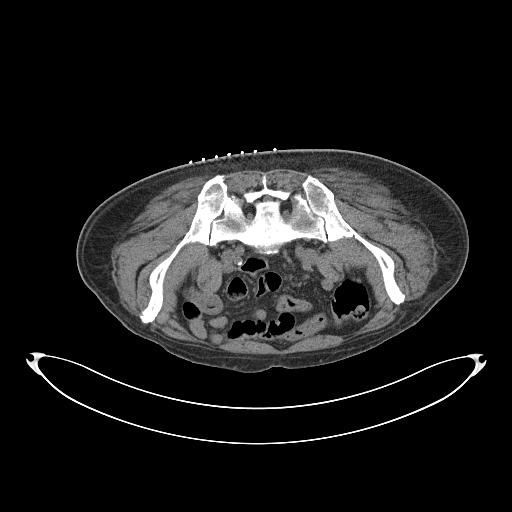
[im 13/21  soft-tissue]
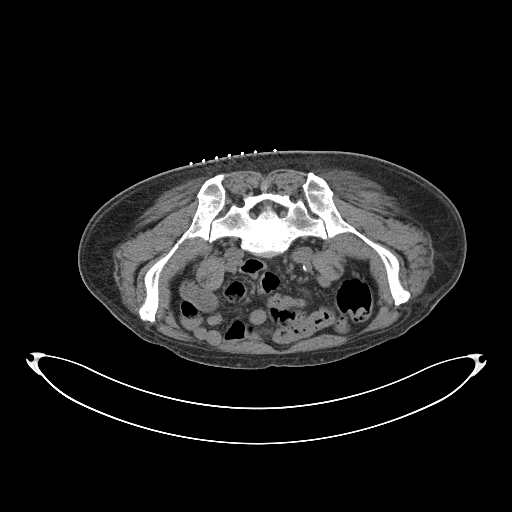
[im 14/21  soft-tissue]
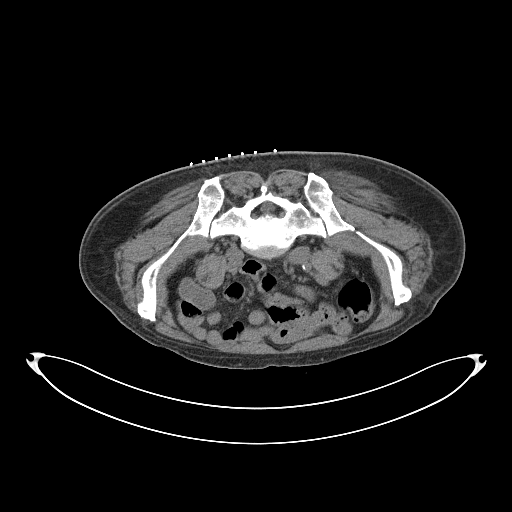
[im 14/21  bone]
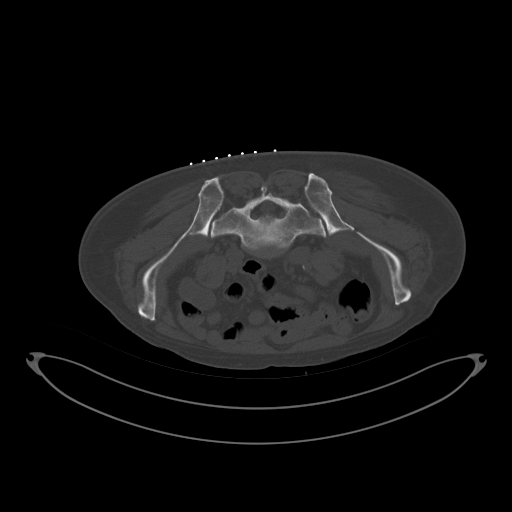
[im 16/21  soft-tissue]
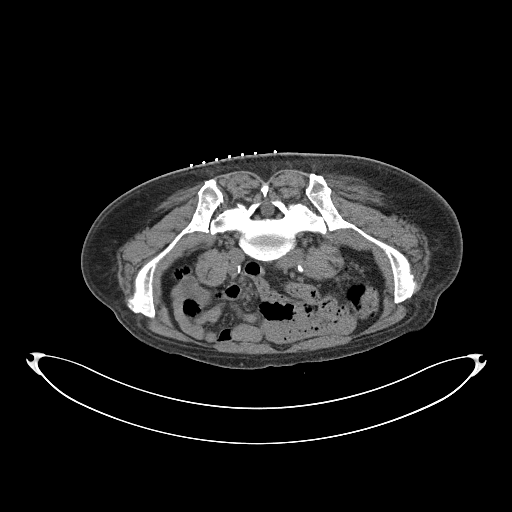
[im 17/21  soft-tissue]
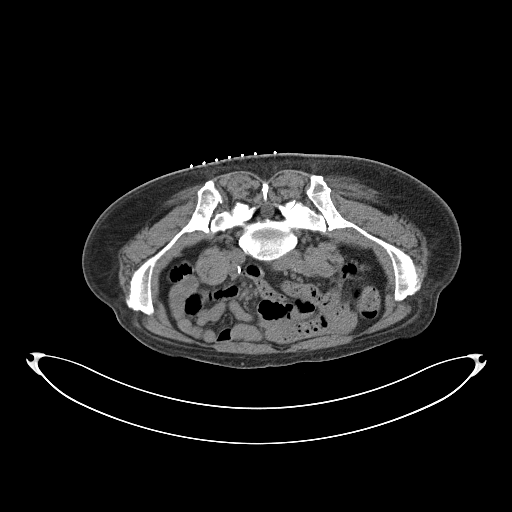
[im 19/21  soft-tissue]
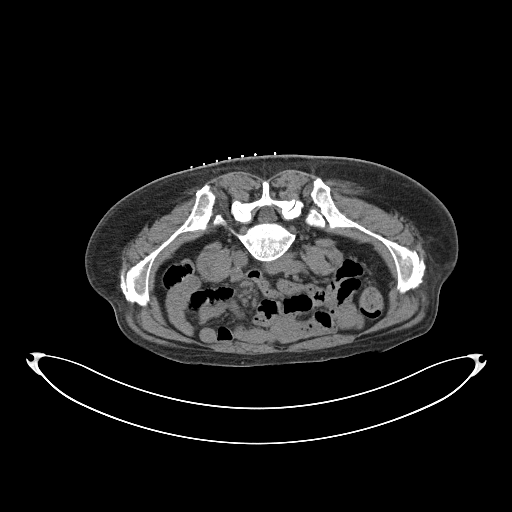

[12 of 16 positions shown; findings below may reference images not displayed]

EXAM:
CT GUIDED BONE MARROW ASPIRATION AND CORE BIOPSY

MEDICATIONS:
None.

ANESTHESIA/SEDATION:
Moderate (conscious) sedation was employed during this procedure. A
total of 2 milligrams versed and 100 micrograms fentanyl were
administered intravenously. The patient's level of consciousness and
vital signs were monitored continuously by radiology nursing
throughout the procedure under my direct supervision.

Total monitored sedation time: 10 minutes

FLUOROSCOPY TIME:  Fluoroscopy Time: 0 minutes 0 seconds (0 mGy).

COMPLICATIONS:
None immediate.

Estimated blood loss: <25 mL

PROCEDURE:
Informed written consent was obtained from the patient after a
thorough discussion of the procedural risks, benefits and
alternatives. All questions were addressed. Maximal Sterile Barrier
Technique was utilized including caps, mask, sterile gowns, sterile
gloves, sterile drape, hand hygiene and skin antiseptic. A timeout
was performed prior to the initiation of the procedure.

The patient was positioned prone and non-contrast localization CT
was performed of the pelvis to demonstrate the iliac marrow spaces.

Maximal barrier sterile technique utilized including caps, mask,
sterile gowns, sterile gloves, large sterile drape, hand hygiene,
and betadine prep.

Under sterile conditions and local anesthesia, an 11 gauge coaxial
bone biopsy needle was advanced into the right iliac marrow space.
Needle position was confirmed with CT imaging. Initially, bone
marrow aspiration was performed. Next, the 11 gauge outer cannula
was utilized to obtain a right iliac bone marrow core biopsy. Needle
was removed. Hemostasis was obtained with compression. The patient
tolerated the procedure well. Samples were prepared with the
cytotechnologist.
IMPRESSION: Technically successful CT-guided bone marrow biopsy of the right
iliac bone.

## 2018-10-24 ENCOUNTER — Other Ambulatory Visit: Payer: Self-pay | Admitting: Internal Medicine

## 2018-10-25 DIAGNOSIS — H43812 Vitreous degeneration, left eye: Secondary | ICD-10-CM | POA: Diagnosis not present

## 2018-11-01 ENCOUNTER — Other Ambulatory Visit: Payer: Self-pay | Admitting: Internal Medicine

## 2018-11-09 ENCOUNTER — Telehealth: Payer: Self-pay | Admitting: Internal Medicine

## 2018-11-09 ENCOUNTER — Other Ambulatory Visit: Payer: Self-pay | Admitting: Internal Medicine

## 2018-11-09 MED ORDER — DULERA 100-5 MCG/ACT IN AERO: 2.0000 | INHALATION_SPRAY | Freq: Two times a day (BID) | RESPIRATORY_TRACT | 5 refills | Status: DC

## 2018-11-09 NOTE — Telephone Encounter (Signed)
[facility] Rx has been sent to pt's preferred pharmacy. Called and spoke with pt letting him know this had been done and pt verbalized understanding. Nothing further needed.

## 2018-11-10 ENCOUNTER — Telehealth (HOSPITAL_COMMUNITY): Payer: Self-pay

## 2018-11-10 DIAGNOSIS — I427 Cardiomyopathy due to drug and external agent: Secondary | ICD-10-CM

## 2018-11-10 NOTE — Telephone Encounter (Signed)
Pt called asking if he needs to continue coreg. Pt was supposed to have an appointment in the spring however was cancelled due to covid.  Will forward to dawne to schedule next available appt (pt aware may be in septembter).  Please advise if patient should continue coreg. Last echo in dec/2019 ef 55-60%

## 2018-11-11 NOTE — Telephone Encounter (Signed)
He needs a 6 month echo (could schedule now) to look for late effects of doxorubicin.  If this looks good, he will be able to stop Coreg.

## 2018-11-12 NOTE — Telephone Encounter (Signed)
Called patient and made him aware that dr Aundra Dubin recommends continuing coreg until an echo is complete. Pt will be scheduled today for echo and pt will be updated on whether or not he can stop coreg. Pt verbalized understanding.

## 2018-11-17 DIAGNOSIS — M25511 Pain in right shoulder: Secondary | ICD-10-CM | POA: Diagnosis not present

## 2018-11-22 ENCOUNTER — Other Ambulatory Visit: Payer: Self-pay

## 2018-11-22 ENCOUNTER — Ambulatory Visit (HOSPITAL_COMMUNITY)
Admission: RE | Admit: 2018-11-22 | Discharge: 2018-11-22 | Disposition: A | Discharge status: Home / Self Care | Payer: BC Managed Care – PPO | Source: Ambulatory Visit | Attending: Family Medicine | Admitting: Family Medicine

## 2018-11-22 DIAGNOSIS — C8590 Non-Hodgkin lymphoma, unspecified, unspecified site: Secondary | ICD-10-CM | POA: Insufficient documentation

## 2018-11-22 DIAGNOSIS — R918 Other nonspecific abnormal finding of lung field: Secondary | ICD-10-CM | POA: Insufficient documentation

## 2018-11-22 DIAGNOSIS — I427 Cardiomyopathy due to drug and external agent: Secondary | ICD-10-CM | POA: Insufficient documentation

## 2018-11-22 DIAGNOSIS — G4730 Sleep apnea, unspecified: Secondary | ICD-10-CM | POA: Diagnosis not present

## 2018-11-22 DIAGNOSIS — T451X5A Adverse effect of antineoplastic and immunosuppressive drugs, initial encounter: Secondary | ICD-10-CM | POA: Insufficient documentation

## 2018-11-22 DIAGNOSIS — E785 Hyperlipidemia, unspecified: Secondary | ICD-10-CM | POA: Diagnosis not present

## 2018-11-22 NOTE — Progress Notes (Signed)
  Echocardiogram 2D Echocardiogram has been performed.  Joseph Powell 11/22/2018, 11:13 AM

## 2018-11-24 ENCOUNTER — Telehealth (HOSPITAL_COMMUNITY): Payer: Self-pay

## 2018-11-24 NOTE — Telephone Encounter (Signed)
Pt aware of results. Inquiring if he can stop Carvedilol.  Message routed to MD via result note. Awaiting response.

## 2018-11-24 NOTE — Telephone Encounter (Signed)
-----   Message from Larey Dresser, MD sent at 11/22/2018  9:40 PM EDT ----- EF normal, 55-60%.

## 2018-11-25 NOTE — Telephone Encounter (Signed)
-----   Message from Larey Dresser, MD sent at 11/24/2018  8:41 PM EDT ----- Would continue, no guarantee EF will not worsen off it.

## 2018-11-25 NOTE — Telephone Encounter (Signed)
Pt aware to continue coreg as ordered. Verbalized understanding.

## 2018-12-07 DIAGNOSIS — C8590 Non-Hodgkin lymphoma, unspecified, unspecified site: Secondary | ICD-10-CM | POA: Diagnosis not present

## 2018-12-09 ENCOUNTER — Telehealth: Payer: Self-pay | Admitting: Hematology

## 2018-12-09 DIAGNOSIS — Z9484 Stem cells transplant status: Secondary | ICD-10-CM | POA: Diagnosis not present

## 2018-12-09 DIAGNOSIS — Z23 Encounter for immunization: Secondary | ICD-10-CM | POA: Diagnosis not present

## 2018-12-09 DIAGNOSIS — C8448 Peripheral T-cell lymphoma, not classified, lymph nodes of multiple sites: Secondary | ICD-10-CM | POA: Diagnosis not present

## 2018-12-09 NOTE — Telephone Encounter (Signed)
Called patient regarding rescheduled appointments from 08/06 to 08/20, patient is notified.

## 2018-12-14 ENCOUNTER — Other Ambulatory Visit: Payer: Self-pay | Admitting: Hematology

## 2018-12-14 DIAGNOSIS — C8498 Mature T/NK-cell lymphomas, unspecified, lymph nodes of multiple sites: Secondary | ICD-10-CM

## 2018-12-17 ENCOUNTER — Ambulatory Visit: Payer: BLUE CROSS/BLUE SHIELD | Admitting: Hematology

## 2018-12-17 ENCOUNTER — Other Ambulatory Visit: Payer: BLUE CROSS/BLUE SHIELD

## 2018-12-20 NOTE — Progress Notes (Signed)
HEMATOLOGY/ONCOLOGY CLINIC NOTE  Date of Service: 12/20/18   Patient Care Team: Hulan Fess, MD as PCP - General (Family Medicine)  CHIEF COMPLAINTS/PURPOSE OF CONSULTATION:   F/u for high gradeT cell NHL  CURRENT THERAPY: post BMT active surveillance  Previous Treatment; R-CHOEP x 6 cycles BEAM -Auto HSCT  HISTORY OF PRESENTING ILLNESS:  See previous note for details  INTERVAL HISTORY   [name] is a wonderful 63 y.o. male here for a F/U of high grade T cell NHL. The patient's last visit with [place] was on 06/18/2018. The pt reports that he is doing well overall. His wife joined [place] via phone call.   The pt reports that he is almost two years out from his transplant and has not had any follow-ups with his transplant team. Has been experiencing pain in his left achilles tendon which was previously attributed to his medication but has continued after he stopped with that medication. He has been taking an NSAID every other day for a few weeks. He has seen an orthopedic doctor about his achilles pain. He has been doing exercises recommended by them and seen slight improvement.  Of note since the pt's last visit, he had a CT neck completed on 09/09/2018 with results revealing " No discrete neck mass or pathologically enlarged cervical lymph nodes."  He also had a CT AP w contrast completed on 09/09/2018 with results revealing "1. No adenopathy. 2. Stable hypodense liver lesions. These were not overtly FDG avid on PET/CT from 10 2019."  Lab results today (12/23/2018) of CBC w/diff and CMP is as follows: all values are WNL except for PLT at 87K, Moncytes Absolute at 1.4K, Abs Immature Granulocytes at 0.10 K, Glucose at 104. 12/23/2018 LDH at 145.  On review of systems, pt reports left achilles tendon pain and denies fever, chills, night sweats, weight loss, paresthesias, abdominal pain, abnormal brusing and any other symptoms.   MEDICAL HISTORY:  Past Medical History:   Diagnosis Date  . Allergic rhinitis   . Asthma    slight  . Bell's palsy   . GERD (gastroesophageal reflux disease)   . Gout   . Hypercholesteremia   . Melanoma ([place])   . Osteoarthritis   . Sleep apnea    Resolving  . Thrombocytopenia ([place])   . Tubular adenoma     SURGICAL HISTORY: Past Surgical History:  Procedure Laterality Date  . IR FLUORO GUIDE PORT INSERTION RIGHT  02/17/2017  . IR REMOVAL TUN ACCESS W/ PORT W/O FL MOD SED  01/05/2018  . IR US GUIDE VASC ACCESS RIGHT  02/17/2017  . KNEE SURGERY  1993  . MELANOMA EXCISION    . NASAL RECONSTRUCTION    . VASECTOMY  1999    SOCIAL HISTORY: Social History   Socioeconomic History  . Marital status: Married    Spouse name: Not on file  . Number of children: Not on file  . Years of education: Not on file  . Highest education level: Not on file  Occupational History  . Occupation: Risk analyst  Social Needs  . Financial resource strain: Not on file  . Food insecurity    Worry: Not on file    Inability: Not on file  . Transportation needs    Medical: Not on file    Non-medical: Not on file  Tobacco Use  . Smoking status: Never Smoker  . Smokeless tobacco: Never Used  Substance and Sexual Activity  . Alcohol use: Yes  Alcohol/week: 2.0 standard drinks    Types: 2 Standard drinks or equivalent per week    Comment: socially  . Drug use: No  . Sexual activity: Not on file  Lifestyle  . Physical activity    Days per week: Not on file    Minutes per session: Not on file  . Stress: Not on file  Relationships  . Social Herbalist on phone: Not on file    Gets together: Not on file    Attends religious service: Not on file    Active member of club or organization: Not on file    Attends meetings of clubs or organizations: Not on file    Relationship status: Not on file  . Intimate partner violence    Fear of current or ex partner: Not on file    Emotionally abused: Not on file    Physically  abused: Not on file    Forced sexual activity: Not on file  Other Topics Concern  . Not on file  Social History Narrative  . Not on file    FAMILY HISTORY: Family History  Problem Relation Age of Onset  . Diabetes Mother   . Asthma Mother   . Gout Brother   . Colon cancer Paternal Uncle   . Esophageal cancer Neg Hx   . Stomach cancer Neg Hx     ALLERGIES:  is allergic to penicillins.  MEDICATIONS:  Current Outpatient Medications  Medication Sig Dispense Refill  . acyclovir (ZOVIRAX) 800 MG tablet Take 800 mg by mouth 2 (two) times daily.    Marland Kitchen albuterol (PROVENTIL HFA;VENTOLIN HFA) 108 (90 Base) MCG/ACT inhaler Inhale 1-2 puffs into the lungs every 6 (six) hours as needed for wheezing or shortness of breath.    . allopurinol (ZYLOPRIM) 100 MG tablet Take 100 mg by mouth 2 (two) times daily.    . carvedilol (COREG) 3.125 MG tablet TAKE 1 TABLET BY MOUTH TWICE DAILY WITH MEALS 180 tablet 3  . desloratadine (CLARINEX) 5 MG tablet Take 5 mg by mouth daily.    . folic acid (FOLVITE) 1 MG tablet Take 1 mg by mouth daily.    . mometasone-formoterol (DULERA) 100-5 MCG/ACT AERO Inhale 2 puffs into the lungs 2 (two) times daily. 13 g 5  . pantoprazole (PROTONIX) 40 MG tablet Take 40 mg by mouth 2 (two) times daily.     Marland Kitchen sulfamethoxazole-trimethoprim (BACTRIM DS,SEPTRA DS) 800-160 MG tablet Take 1 tablet by mouth. Every,monday, Wednesday, and Friday    . traZODone (DESYREL) 50 MG tablet TAKE 1 TO 2 TABLETS BY MOUTH AT BEDTIME 60 tablet 0   Current Facility-Administered Medications  Medication Dose Route Frequency Provider Last Rate Last Dose  . 0.9 %  sodium chloride infusion  500 mL Intravenous Continuous Milus Banister, MD        REVIEW OF SYSTEMS:    A 10+ POINT REVIEW OF SYSTEMS WAS OBTAINED including neurology, dermatology, psychiatry, cardiac, respiratory, lymph, extremities, GI, GU, Musculoskeletal, constitutional, breasts, reproductive, HEENT.  All pertinent positives are  noted in the HPI.  All others are negative.   PHYSICAL EXAMINATION: ECOG PERFORMANCE STATUS: 1 - Symptomatic but completely ambulatory  Vitals:   12/23/18 0931  BP: 119/82  Pulse: 75  Resp: 18  Temp: 98.2 F (36.8 C)  SpO2: 100%   Filed Weights   12/23/18 0931  Weight: 206 lb 12.8 oz (93.8 kg)   .Body mass index is 28.84 kg/m.   GENERAL:alert, in no  acute distress and comfortable SKIN: no acute rashes, no significant lesions EYES: conjunctiva are pink and non-injected, sclera anicteric OROPHARYNX: MMM, no exudates, no oropharyngeal erythema or ulceration NECK: supple, no JVD LYMPH:  no palpable lymphadenopathy in the cervical, axillary or inguinal regions LUNGS: clear to auscultation b/l with normal respiratory effort HEART: regular rate & rhythm ABDOMEN:  normoactive bowel sounds, non tender, not distended. No palpable hepatosplenomegaly.  Extremity: no pedal edema PSYCH: alert & oriented x 3 with fluent speech NEURO: no focal motor/sensory deficits   LABORATORY DATA:  I have reviewed the data as listed  . CBC Latest Ref Rng & Units 12/23/2018 06/18/2018 01/05/2018  WBC 4.0 - 10.5 K/uL 6.0 5.2 2.9(L)  Hemoglobin 13.0 - 17.0 g/dL 14.4 13.4 12.5(L)  Hematocrit 39.0 - 52.0 % 42.1 39.6 36.8(L)  Platelets 150 - 400 K/uL 87(L) 78(L) 65(L)  ANC 8.6k . CMP Latest Ref Rng & Units 12/23/2018 06/18/2018 12/18/2017  Glucose 70 - 99 mg/dL 104(H) 110(H) 98  BUN 8 - 23 mg/dL 8 15 11   Creatinine 0.61 - 1.24 mg/dL 0.86 0.84 0.82  Sodium 135 - 145 mmol/L 142 141 144  Potassium 3.5 - 5.1 mmol/L 3.9 4.0 4.6  Chloride 98 - 111 mmol/L 106 106 108  CO2 22 - 32 mmol/L 26 26 29   Calcium 8.9 - 10.3 mg/dL 8.9 9.4 9.3  Total Protein 6.5 - 8.1 g/dL 6.5 6.7 6.4(L)  Total Bilirubin 0.3 - 1.2 mg/dL 0.4 0.7 0.6  Alkaline Phos 38 - 126 U/L 95 100 123  AST 15 - 41 U/L 19 19 23   ALT 0 - 44 U/L 19 19 21    Uric acid 3.1    06/22/17 Cytogenetics Report:   06/22/17 Flow Cytometry:    06/22/17  BM Bx:    RADIOGRAPHIC STUDIES:  I have personally reviewed the radiological images as listed and agreed with the findings in the report. No results found.   11/13/17 PET/CT    ASSESSMENT & PLAN:    Joseph Powell is a 63 y.o. 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 No adenopathy. 2. Stable hypodense liver lesions. These were not overtly FDG avid on PET/CT from 10 2019."  #2 Thrombocytopenia. Patient has some chronic thrombocytopenia 85-125k for a few years. Platelets upon initial visit are down to 36k in the setting of newly diagnosed lymphoma and treatment with R-CHOEP and from hypersplenism but then improved. PLt counts had normalized after treatment of the lymphoma pre-transplant  PLAN: -Discussed pt labwork today, 12/23/2018; blood counts continue to trend toward normal.  -Discussed the need for successive imaging 2 years after transplant.  -Advised continuing to wean off Omeprazole to help improve plateles as wellt -The pt shows no clinical or lab or radiographic progression/return of his peripheral T-Cell lymphoma at this time. -No indication for further treatment at this time. -Pt has discontinued Bactrim, Allopurinol, and Acyclovir course -Continue Vitamin B complex -Recommend limiting NSAID use. -Will see the pt back in 6 months with labs  #7 History of Melanoma, 2009 -Followed by Dr. Allyson Sabal for continued skin screening   #8 Patient Active Problem List   Diagnosis Date  Noted  . Pulmonary infiltrates 06/24/2017  . Peripheral T cell lymphoma of intrathoracic lymph nodes ([place])  03/11/2017  . Counseling regarding advanced care planning and goals of care 02/12/2017  . Mature T/NK-cell lymphomas, unspecified, lymph nodes of multiple sites ([place]) 02/10/2017  . Cough variant asthma vs UACS 01/27/2017  . Dyslipidemia 08/05/2016  . Mild asthma 08/05/2016  . DJD (degenerative joint disease) 08/05/2016  . HLD (hyperlipidemia) 07/27/2016  . GERD (gastroesophageal reflux disease) 07/27/2016  . Gout 07/27/2016  . Discomfort in chest 07/27/2016  . OSA (obstructive sleep apnea) 09/19/2013  . Thrombocytopenia ([place]) 04/14/2011  . SCHATZKI'S RING 08/21/2009   #9 Left sided jaw pain resolved   RTC with Dr Irene Limbo with labs in 6 months   All of the patients questions were answered with apparent satisfaction. The patient knows to call the clinic with any problems, questions or concerns.  The total time spent in the appt was 45minutes and more than 50% was on counseling and direct patient cares.   Sullivan Lone MD [place] AAHIVMS [facility] Hematology/Oncology Physician [facility]  (Office):       [phone] (Work cell):  [phone] (Fax):           [phone]  I, De Burrs, am acting as a scribe for [name]  .I have reviewed the above documentation for accuracy and completeness, and I agree with the above. Brunetta Genera MD

## 2018-12-22 ENCOUNTER — Telehealth: Payer: Self-pay

## 2018-12-22 NOTE — Telephone Encounter (Signed)
Left vm for pt regarding prescreening questions for appointment on 8/20. ° °

## 2018-12-23 ENCOUNTER — Other Ambulatory Visit: Payer: Self-pay

## 2018-12-23 ENCOUNTER — Inpatient Hospital Stay (HOSPITAL_BASED_OUTPATIENT_CLINIC_OR_DEPARTMENT_OTHER): Payer: BC Managed Care – PPO | Admitting: Hematology

## 2018-12-23 ENCOUNTER — Inpatient Hospital Stay: Payer: BC Managed Care – PPO | Attending: Hematology

## 2018-12-23 ENCOUNTER — Telehealth: Payer: Self-pay | Admitting: Hematology

## 2018-12-23 VITALS — BP 119/82 | HR 75 | Temp 98.2°F | Resp 18 | Ht 71.0 in | Wt 206.8 lb

## 2018-12-23 DIAGNOSIS — Z85820 Personal history of malignant melanoma of skin: Secondary | ICD-10-CM | POA: Insufficient documentation

## 2018-12-23 DIAGNOSIS — K219 Gastro-esophageal reflux disease without esophagitis: Secondary | ICD-10-CM | POA: Insufficient documentation

## 2018-12-23 DIAGNOSIS — D696 Thrombocytopenia, unspecified: Secondary | ICD-10-CM | POA: Diagnosis not present

## 2018-12-23 DIAGNOSIS — C8442 Peripheral T-cell lymphoma, not classified, intrathoracic lymph nodes: Secondary | ICD-10-CM

## 2018-12-23 DIAGNOSIS — C8498 Mature T/NK-cell lymphomas, unspecified, lymph nodes of multiple sites: Secondary | ICD-10-CM

## 2018-12-23 LAB — LACTATE DEHYDROGENASE: LDH: 145 U/L (ref 98–192)

## 2018-12-23 LAB — CBC WITH DIFFERENTIAL/PLATELET
Abs Immature Granulocytes: 0.1 10*3/uL — ABNORMAL HIGH (ref 0.00–0.07)
Basophils Absolute: 0 10*3/uL (ref 0.0–0.1)
Basophils Relative: 1 %
Eosinophils Absolute: 0.1 10*3/uL (ref 0.0–0.5)
Eosinophils Relative: 1 %
HCT: 42.1 % (ref 39.0–52.0)
Hemoglobin: 14.4 g/dL (ref 13.0–17.0)
Immature Granulocytes: 2 %
Lymphocytes Relative: 18 %
Lymphs Abs: 1.1 10*3/uL (ref 0.7–4.0)
MCH: 31.6 pg (ref 26.0–34.0)
MCHC: 34.2 g/dL (ref 30.0–36.0)
MCV: 92.5 fL (ref 80.0–100.0)
Monocytes Absolute: 1.4 10*3/uL — ABNORMAL HIGH (ref 0.1–1.0)
Monocytes Relative: 24 %
Neutro Abs: 3.2 10*3/uL (ref 1.7–7.7)
Neutrophils Relative %: 54 %
Platelets: 87 10*3/uL — ABNORMAL LOW (ref 150–400)
RBC: 4.55 MIL/uL (ref 4.22–5.81)
RDW: 12.5 % (ref 11.5–15.5)
WBC: 6 10*3/uL (ref 4.0–10.5)
nRBC: 0 % (ref 0.0–0.2)

## 2018-12-23 LAB — CMP (CANCER CENTER ONLY)
ALT: 19 U/L (ref 0–44)
AST: 19 U/L (ref 15–41)
Albumin: 4.4 g/dL (ref 3.5–5.0)
Alkaline Phosphatase: 95 U/L (ref 38–126)
Anion gap: 10 (ref 5–15)
BUN: 8 mg/dL (ref 8–23)
CO2: 26 mmol/L (ref 22–32)
Calcium: 8.9 mg/dL (ref 8.9–10.3)
Chloride: 106 mmol/L (ref 98–111)
Creatinine: 0.86 mg/dL (ref 0.61–1.24)
GFR, Est AFR Am: >60 mL/min (ref >60)
GFR, Estimated: >60 mL/min (ref >60)
Glucose, Bld: 104 mg/dL — ABNORMAL HIGH (ref 70–99)
Potassium: 3.9 mmol/L (ref 3.5–5.1)
Sodium: 142 mmol/L (ref 135–145)
Total Bilirubin: 0.4 mg/dL (ref 0.3–1.2)
Total Protein: 6.5 g/dL (ref 6.5–8.1)

## 2018-12-23 NOTE — Telephone Encounter (Signed)
Scheduled appointments per 08/20 los, patient received after visit summary report.

## 2019-01-06 DIAGNOSIS — Z85820 Personal history of malignant melanoma of skin: Secondary | ICD-10-CM | POA: Diagnosis not present

## 2019-01-06 DIAGNOSIS — Z8639 Personal history of other endocrine, nutritional and metabolic disease: Secondary | ICD-10-CM | POA: Diagnosis not present

## 2019-01-06 DIAGNOSIS — Z8739 Personal history of other diseases of the musculoskeletal system and connective tissue: Secondary | ICD-10-CM | POA: Diagnosis not present

## 2019-01-06 DIAGNOSIS — Z8572 Personal history of non-Hodgkin lymphomas: Secondary | ICD-10-CM | POA: Diagnosis not present

## 2019-01-18 DIAGNOSIS — Z8739 Personal history of other diseases of the musculoskeletal system and connective tissue: Secondary | ICD-10-CM | POA: Diagnosis not present

## 2019-01-18 DIAGNOSIS — Z125 Encounter for screening for malignant neoplasm of prostate: Secondary | ICD-10-CM | POA: Diagnosis not present

## 2019-01-18 DIAGNOSIS — Z23 Encounter for immunization: Secondary | ICD-10-CM | POA: Diagnosis not present

## 2019-01-18 DIAGNOSIS — Z8639 Personal history of other endocrine, nutritional and metabolic disease: Secondary | ICD-10-CM | POA: Diagnosis not present

## 2019-01-18 DIAGNOSIS — E782 Mixed hyperlipidemia: Secondary | ICD-10-CM | POA: Diagnosis not present

## 2019-02-21 DIAGNOSIS — Z20828 Contact with and (suspected) exposure to other viral communicable diseases: Secondary | ICD-10-CM | POA: Diagnosis not present

## 2019-04-04 DIAGNOSIS — Z23 Encounter for immunization: Secondary | ICD-10-CM | POA: Diagnosis not present

## 2019-04-07 DIAGNOSIS — C8448 Peripheral T-cell lymphoma, not classified, lymph nodes of multiple sites: Secondary | ICD-10-CM | POA: Diagnosis not present

## 2019-04-07 DIAGNOSIS — D696 Thrombocytopenia, unspecified: Secondary | ICD-10-CM | POA: Diagnosis not present

## 2019-04-07 DIAGNOSIS — Z9484 Stem cells transplant status: Secondary | ICD-10-CM | POA: Diagnosis not present

## 2019-04-07 DIAGNOSIS — Z8619 Personal history of other infectious and parasitic diseases: Secondary | ICD-10-CM | POA: Diagnosis not present

## 2019-04-07 DIAGNOSIS — R918 Other nonspecific abnormal finding of lung field: Secondary | ICD-10-CM | POA: Diagnosis not present

## 2019-04-07 DIAGNOSIS — J328 Other chronic sinusitis: Secondary | ICD-10-CM | POA: Diagnosis not present

## 2019-04-07 DIAGNOSIS — R9389 Abnormal findings on diagnostic imaging of other specified body structures: Secondary | ICD-10-CM | POA: Diagnosis not present

## 2019-04-07 DIAGNOSIS — K7689 Other specified diseases of liver: Secondary | ICD-10-CM | POA: Diagnosis not present

## 2019-04-19 ENCOUNTER — Other Ambulatory Visit: Payer: Self-pay | Admitting: Hematology

## 2019-04-19 DIAGNOSIS — C8498 Mature T/NK-cell lymphomas, unspecified, lymph nodes of multiple sites: Secondary | ICD-10-CM

## 2019-05-16 DIAGNOSIS — L821 Other seborrheic keratosis: Secondary | ICD-10-CM | POA: Diagnosis not present

## 2019-05-16 DIAGNOSIS — L570 Actinic keratosis: Secondary | ICD-10-CM | POA: Diagnosis not present

## 2019-05-16 DIAGNOSIS — D1801 Hemangioma of skin and subcutaneous tissue: Secondary | ICD-10-CM | POA: Diagnosis not present

## 2019-05-16 DIAGNOSIS — D485 Neoplasm of uncertain behavior of skin: Secondary | ICD-10-CM | POA: Diagnosis not present

## 2019-05-16 DIAGNOSIS — D229 Melanocytic nevi, unspecified: Secondary | ICD-10-CM | POA: Diagnosis not present

## 2019-05-16 DIAGNOSIS — L905 Scar conditions and fibrosis of skin: Secondary | ICD-10-CM | POA: Diagnosis not present

## 2019-05-17 DIAGNOSIS — D225 Melanocytic nevi of trunk: Secondary | ICD-10-CM | POA: Diagnosis not present

## 2019-05-27 DIAGNOSIS — S20211A Contusion of right front wall of thorax, initial encounter: Secondary | ICD-10-CM | POA: Diagnosis not present

## 2019-05-27 DIAGNOSIS — R0781 Pleurodynia: Secondary | ICD-10-CM | POA: Diagnosis not present

## 2019-06-23 ENCOUNTER — Telehealth: Payer: Self-pay | Admitting: Hematology

## 2019-06-23 NOTE — Telephone Encounter (Signed)
Rescheduled per 2/18. Called and spoke with pt, confirmed 2/19 appt

## 2019-06-24 ENCOUNTER — Inpatient Hospital Stay: Payer: BC Managed Care – PPO | Attending: Hematology

## 2019-06-24 ENCOUNTER — Other Ambulatory Visit: Payer: Self-pay

## 2019-06-24 ENCOUNTER — Inpatient Hospital Stay: Payer: Self-pay | Admitting: Hematology

## 2019-06-24 ENCOUNTER — Inpatient Hospital Stay: Payer: Self-pay

## 2019-06-24 ENCOUNTER — Inpatient Hospital Stay (HOSPITAL_BASED_OUTPATIENT_CLINIC_OR_DEPARTMENT_OTHER): Payer: BC Managed Care – PPO | Admitting: Hematology

## 2019-06-24 ENCOUNTER — Telehealth: Payer: Self-pay | Admitting: Hematology

## 2019-06-24 VITALS — BP 136/92 | HR 79 | Temp 97.8°F | Resp 18 | Ht 71.0 in | Wt 201.2 lb

## 2019-06-24 DIAGNOSIS — K219 Gastro-esophageal reflux disease without esophagitis: Secondary | ICD-10-CM | POA: Insufficient documentation

## 2019-06-24 DIAGNOSIS — C8498 Mature T/NK-cell lymphomas, unspecified, lymph nodes of multiple sites: Secondary | ICD-10-CM | POA: Insufficient documentation

## 2019-06-24 DIAGNOSIS — D696 Thrombocytopenia, unspecified: Secondary | ICD-10-CM | POA: Insufficient documentation

## 2019-06-24 DIAGNOSIS — Z85820 Personal history of malignant melanoma of skin: Secondary | ICD-10-CM | POA: Insufficient documentation

## 2019-06-24 DIAGNOSIS — E559 Vitamin D deficiency, unspecified: Secondary | ICD-10-CM

## 2019-06-24 DIAGNOSIS — Z79899 Other long term (current) drug therapy: Secondary | ICD-10-CM | POA: Insufficient documentation

## 2019-06-24 DIAGNOSIS — M109 Gout, unspecified: Secondary | ICD-10-CM | POA: Diagnosis not present

## 2019-06-24 LAB — CMP (CANCER CENTER ONLY)
ALT: 15 U/L (ref 0–44)
AST: 13 U/L — ABNORMAL LOW (ref 15–41)
Albumin: 4.4 g/dL (ref 3.5–5.0)
Alkaline Phosphatase: 105 U/L (ref 38–126)
Anion gap: 9 (ref 5–15)
BUN: 10 mg/dL (ref 8–23)
CO2: 24 mmol/L (ref 22–32)
Calcium: 9 mg/dL (ref 8.9–10.3)
Chloride: 109 mmol/L (ref 98–111)
Creatinine: 0.8 mg/dL (ref 0.61–1.24)
GFR, Est AFR Am: >60 mL/min (ref >60)
GFR, Estimated: >60 mL/min (ref >60)
Glucose, Bld: 119 mg/dL — ABNORMAL HIGH (ref 70–99)
Potassium: 4.1 mmol/L (ref 3.5–5.1)
Sodium: 142 mmol/L (ref 135–145)
Total Bilirubin: 0.8 mg/dL (ref 0.3–1.2)
Total Protein: 6.7 g/dL (ref 6.5–8.1)

## 2019-06-24 LAB — CBC WITH DIFFERENTIAL/PLATELET
Abs Immature Granulocytes: 0.08 10*3/uL — ABNORMAL HIGH (ref 0.00–0.07)
Basophils Absolute: 0 10*3/uL (ref 0.0–0.1)
Basophils Relative: 0 %
Eosinophils Absolute: 0.1 10*3/uL (ref 0.0–0.5)
Eosinophils Relative: 1 %
HCT: 42.3 % (ref 39.0–52.0)
Hemoglobin: 14.5 g/dL (ref 13.0–17.0)
Immature Granulocytes: 1 %
Lymphocytes Relative: 17 %
Lymphs Abs: 1 10*3/uL (ref 0.7–4.0)
MCH: 31.3 pg (ref 26.0–34.0)
MCHC: 34.3 g/dL (ref 30.0–36.0)
MCV: 91.4 fL (ref 80.0–100.0)
Monocytes Absolute: 2 10*3/uL — ABNORMAL HIGH (ref 0.1–1.0)
Monocytes Relative: 35 %
Neutro Abs: 2.6 10*3/uL (ref 1.7–7.7)
Neutrophils Relative %: 46 %
Platelets: 94 10*3/uL — ABNORMAL LOW (ref 150–400)
RBC: 4.63 MIL/uL (ref 4.22–5.81)
RDW: 12.8 % (ref 11.5–15.5)
WBC: 5.7 10*3/uL (ref 4.0–10.5)
nRBC: 0 % (ref 0.0–0.2)

## 2019-06-24 LAB — LACTATE DEHYDROGENASE: LDH: 156 U/L (ref 98–192)

## 2019-06-24 MED ORDER — ERGOCALCIFEROL 1.25 MG (50000 UT) PO CAPS: 50000.0000 [IU] | ORAL_CAPSULE | ORAL | 2 refills | Status: DC

## 2019-06-24 NOTE — Progress Notes (Signed)
HEMATOLOGY/ONCOLOGY CLINIC NOTE  Date of Service: 06/24/19   Patient Care Team: Hulan Fess, MD as PCP - General (Family Medicine)  CHIEF COMPLAINTS/PURPOSE OF CONSULTATION:   F/u for high gradeT cell NHL  CURRENT THERAPY: post BMT active surveillance  Previous Treatment; R-CHOEP x 6 cycles BEAM -Auto HSCT  HISTORY OF PRESENTING ILLNESS:  See previous note for details  INTERVAL HISTORY   Joseph Powell is a wonderful 64 y.o. male here for a F/U of high grade T cell NHL after 6 cycles of R-CHOP, s/p transplant. We are joined today by his wife [name], via phone. The patient's last visit with [place] was on 12/23/2018. The pt reports that he is doing well overall.  The pt reports that he and his wife had COVID19 in October. They lost their sense of taste and had respiratory symptoms. He denies any residual symptoms. Pt is waiting to register for the COVID19 vaccine. Pt has been eating well and feels that his energy levels are back to baseline. He has discontinued using Omeprazole and denies any acid reflux symptoms. Pt had a fall a few weeks ago that bruised his right ribs and back. Pt was given Hydrocodone to help with his pain. He has not recently taken OTC pain medication.   Of note since the patient's last visit, pt has had CT C/A/P completed on 04/07/2019 with results revealing "1. Slight interval increase in size a central small bowel mesentery lymph node, nonspecific. Otherwise, no evidence of new or enlarging adenopathy involving the chest, abdomen or pelvis. Advise continued attention on follow-up. 2. Scattered groundglass opacities within the right upper lobe which could reflect developing infection (including atypical etiologies) in the appropriate clinical setting."  Lab results today (06/24/19) of CBC w/diff and CMP is as follows: all values are WNL except for PLT at 94K, Mono Abs at 2.0K, Abs Immature Granulocytes at 0.08K, Glucose at 119, AST at 13. 06/24/2019 LDH at  156  On review of systems, pt reports healthy appetite and denies fevers, chills, night sweats, unexpected weight loss, fatigue, acid reflux and any other symptoms.   MEDICAL HISTORY:  Past Medical History:  Diagnosis Date  . Allergic rhinitis   . Asthma    slight  . Bell's palsy   . GERD (gastroesophageal reflux disease)   . Gout   . Hypercholesteremia   . Melanoma ([place])   . Osteoarthritis   . Sleep apnea    Resolving  . Thrombocytopenia ([place])   . Tubular adenoma     SURGICAL HISTORY: Past Surgical History:  Procedure Laterality Date  . IR FLUORO GUIDE PORT INSERTION RIGHT  02/17/2017  . IR REMOVAL TUN ACCESS W/ PORT W/O FL MOD SED  01/05/2018  . IR US GUIDE VASC ACCESS RIGHT  02/17/2017  . KNEE SURGERY  1993  . MELANOMA EXCISION    . NASAL RECONSTRUCTION    . VASECTOMY  1999    SOCIAL HISTORY: Social History   Socioeconomic History  . Marital status: Married    Spouse name: Not on file  . Number of children: Not on file  . Years of education: Not on file  . Highest education level: Not on file  Occupational History  . Occupation: Risk analyst  Tobacco Use  . Smoking status: Never Smoker  . Smokeless tobacco: Never Used  Substance and Sexual Activity  . Alcohol use: Yes    Alcohol/week: 2.0 standard drinks    Types: 2 Standard drinks or equivalent per week  Comment: socially  . Drug use: No  . Sexual activity: Not on file  Other Topics Concern  . Not on file  Social History Narrative  . Not on file   Social Determinants of Health   Financial Resource Strain:   . Difficulty of Paying Living Expenses: Not on file  Food Insecurity:   . Worried About Charity fundraiser in the Last Year: Not on file  . Ran Out of Food in the Last Year: Not on file  Transportation Needs:   . Lack of Transportation (Medical): Not on file  . Lack of Transportation (Non-Medical): Not on file  Physical Activity:   . Days of Exercise per Week: Not on file  . Minutes of  Exercise per Session: Not on file  Stress:   . Feeling of Stress : Not on file  Social Connections:   . Frequency of Communication with Friends and Family: Not on file  . Frequency of Social Gatherings with Friends and Family: Not on file  . Attends Religious Services: Not on file  . Active Member of Clubs or Organizations: Not on file  . Attends Archivist Meetings: Not on file  . Marital Status: Not on file  Intimate Partner Violence:   . Fear of Current or Ex-Partner: Not on file  . Emotionally Abused: Not on file  . Physically Abused: Not on file  . Sexually Abused: Not on file    FAMILY HISTORY: Family History  Problem Relation Age of Onset  . Diabetes Mother   . Asthma Mother   . Gout Brother   . Colon cancer Paternal Uncle   . Esophageal cancer Neg Hx   . Stomach cancer Neg Hx     ALLERGIES:  is allergic to penicillamine and penicillins.  MEDICATIONS:  Current Outpatient Medications  Medication Sig Dispense Refill  . carvedilol (COREG) 3.125 MG tablet TAKE 1 TABLET BY MOUTH TWICE DAILY WITH MEALS 180 tablet 3  . desloratadine (CLARINEX) 5 MG tablet Take 5 mg by mouth daily.    . traZODone (DESYREL) 50 MG tablet Take 1-2 tablets (50-100 mg total) by mouth at bedtime as needed. 60 tablet 0  . acyclovir (ZOVIRAX) 800 MG tablet Take 800 mg by mouth 2 (two) times daily.    Marland Kitchen albuterol (PROVENTIL HFA;VENTOLIN HFA) 108 (90 Base) MCG/ACT inhaler Inhale 1-2 puffs into the lungs every 6 (six) hours as needed for wheezing or shortness of breath.    . allopurinol (ZYLOPRIM) 100 MG tablet Take 100 mg by mouth 2 (two) times daily.    . ergocalciferol (VITAMIN D2) 1.25 MG (50000 UT) capsule Take 1 capsule (50,000 Units total) by mouth once a week. 12 capsule 2  . folic acid (FOLVITE) 1 MG tablet Take 1 mg by mouth daily.    . mometasone-formoterol (DULERA) 100-5 MCG/ACT AERO Inhale 2 puffs into the lungs 2 (two) times daily. (Patient not taking: Reported on 06/24/2019) 13 g  5  . pantoprazole (PROTONIX) 40 MG tablet Take 40 mg by mouth 2 (two) times daily.     Marland Kitchen sulfamethoxazole-trimethoprim (BACTRIM DS,SEPTRA DS) 800-160 MG tablet Take 1 tablet by mouth. Every,monday, Wednesday, and Friday     Current Facility-Administered Medications  Medication Dose Route Frequency Provider Last Rate Last Admin  . 0.9 %  sodium chloride infusion  500 mL Intravenous Continuous Milus Banister, MD        REVIEW OF SYSTEMS:   A 10+ POINT REVIEW OF SYSTEMS WAS OBTAINED including neurology,  dermatology, psychiatry, cardiac, respiratory, lymph, extremities, GI, GU, Musculoskeletal, constitutional, breasts, reproductive, HEENT.  All pertinent positives are noted in the HPI.  All others are negative.   PHYSICAL EXAMINATION: ECOG PERFORMANCE STATUS: 1 - Symptomatic but completely ambulatory  Vitals:   06/24/19 1025  BP: (!) 136/92  Pulse: 79  Resp: 18  Temp: 97.8 F (36.6 C)  SpO2: 96%   Filed Weights   06/24/19 1025  Weight: 201 lb 3.2 oz (91.3 kg)   .Body mass index is 28.06 kg/m.   GENERAL:alert, in no acute distress and comfortable SKIN: no acute rashes, no significant lesions EYES: conjunctiva are pink and non-injected, sclera anicteric OROPHARYNX: MMM, no exudates, no oropharyngeal erythema or ulceration NECK: supple, no JVD LYMPH:  no palpable lymphadenopathy in the cervical, axillary or inguinal regions LUNGS: clear to auscultation b/l with normal respiratory effort HEART: regular rate & rhythm ABDOMEN:  normoactive bowel sounds , non tender, not distended. No palpable hepatosplenomegaly.  Extremity: no pedal edema PSYCH: alert & oriented x 3 with fluent speech NEURO: no focal motor/sensory deficits  LABORATORY DATA:  I have reviewed the data as listed  . CBC Latest Ref Rng & Units 06/24/2019 12/23/2018 06/18/2018  WBC 4.0 - 10.5 K/uL 5.7 6.0 5.2  Hemoglobin 13.0 - 17.0 g/dL 14.5 14.4 13.4  Hematocrit 39.0 - 52.0 % 42.3 42.1 39.6  Platelets 150 - 400  K/uL 94(L) 87(L) 78(L)  ANC 8.6k . CMP Latest Ref Rng & Units 06/24/2019 12/23/2018 06/18/2018  Glucose 70 - 99 mg/dL 119(H) 104(H) 110(H)  BUN 8 - 23 mg/dL 10 8 15   Creatinine 0.61 - 1.24 mg/dL 0.80 0.86 0.84  Sodium 135 - 145 mmol/L 142 142 141  Potassium 3.5 - 5.1 mmol/L 4.1 3.9 4.0  Chloride 98 - 111 mmol/L 109 106 106  CO2 22 - 32 mmol/L 24 26 26   Calcium 8.9 - 10.3 mg/dL 9.0 8.9 9.4  Total Protein 6.5 - 8.1 g/dL 6.7 6.5 6.7  Total Bilirubin 0.3 - 1.2 mg/dL 0.8 0.4 0.7  Alkaline Phos 38 - 126 U/L 105 95 100  AST 15 - 41 U/L 13(L) 19 19  ALT 0 - 44 U/L 15 19 19    Uric acid 3.1    06/22/17 Cytogenetics Report:   06/22/17 Flow Cytometry:    06/22/17 BM Bx:    RADIOGRAPHIC STUDIES:  I have personally reviewed the radiological images as listed and agreed with the findings in the report. No results found.   11/13/17 PET/CT    ASSESSMENT & PLAN:    Joseph Powell is a 64 y.o. 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 No adenopathy. 2. Stable hypodense liver lesions. These were not overtly FDG avid on PET/CT from 10 2019."  #2 Thrombocytopenia. Patient has some chronic thrombocytopenia 85-125k for a few years. Platelets upon initial visit are down to 36k in the setting of newly diagnosed lymphoma and treatment with R-CHOEP and from hypersplenism but then improved. PLt counts had normalized after treatment of the lymphoma pre-transplant  PLAN: -Discussed pt labwork today, 06/24/19; blood counts are good, PLT gradually recovering, blood chemistries are steady, LDH is WNL -  Discussed 04/07/2019  CT C/A/P which revealed "1. Slight interval increase in size a central small bowel mesentery lymph node, nonspecific. Otherwise, no evidence of new or enlarging adenopathy involving the chest, abdomen or pelvis." -The pt shows no clinical or lab or radiographic progression/return of his peripheral T-Cell lymphoma at this time. -No indication for further treatment at this time. -Pt is nearly 21 months post-transplant  -Advised pt that a recent COVID19 infection may have caused increased lymph node activity on latest CT Scan -Advised pt that optimizing Vitamin D level will help with immune function and bone health, which may be slightly compromised due to transplant - Goal Vitamin D >=60  -Recommend pt receive the COVID19 vaccination after f/u with Dr. Jolayne Haines to discuss  -He is scheduled to f/u with Dr. Jolayne Haines for 2 year post-transplant vaccinations -Continue limiting NSAID use and avoiding acid suppressants from a thrombocytopenia standpoint. -Recommend OTC B-complex vitamin  -Rx Ergocalciferol  -Will see the pt back in 6 months with labs  #7 History of Melanoma, 2009 -Followed by Dr. Allyson Sabal for continued skin screening   #8 Patient Active Problem List   Diagnosis Date Noted  . Pulmonary infiltrates 06/24/2017  . Peripheral T cell lymphoma of intrathoracic lymph nodes ([place]) 03/11/2017  . Counseling regarding advanced care planning and goals of care 02/12/2017  . Mature T/NK-cell lymphomas, unspecified, lymph nodes of multiple sites ([place]) 02/10/2017  . Cough variant asthma vs UACS 01/27/2017  . Dyslipidemia 08/05/2016  . Mild asthma 08/05/2016  . DJD (degenerative joint disease) 08/05/2016  . HLD (hyperlipidemia) 07/27/2016  . GERD (gastroesophageal reflux disease) 07/27/2016  . Gout 07/27/2016  . Discomfort in chest 07/27/2016  . OSA (obstructive sleep apnea) 09/19/2013  . Thrombocytopenia ([place]) 04/14/2011  . SCHATZKI'S RING 08/21/2009   #9 Left sided jaw  pain resolved   FOLLOW UP: RTC with Dr Irene Limbo with labs in 6 months   . The total time spent in the appointment was 30 minutes and more than 50% was on counseling and direct patient cares.    All of the patient's questions were answered with apparent satisfaction. The patient knows to call the clinic with any problems, questions or concerns.    Sullivan Lone MD [place] AAHIVMS [facility] Hematology/Oncology Physician [facility]  (Office):       [phone] (Work cell):  [phone] (Fax):           [phone]  I, Yevette Edwards, am acting as a scribe for Dr. Sullivan Lone.   .I have reviewed the above documentation for accuracy and completeness, and I agree with the above. Brunetta Genera MD

## 2019-06-24 NOTE — Telephone Encounter (Signed)
Scheduled appt per 2/18 los - pt aware of appt my chart active. Per pt no print out needed.

## 2019-06-29 NOTE — Progress Notes (Signed)
Cardiology Office Note:    Date:  06/30/2019   ID:  Joseph Powell, DOB [birth year], MRN [id number]  PCP:  Hulan Fess, MD  Cardiologist:  Sanda Klein, MD   Referring MD: Hulan Fess, MD   Chief Complaint  Patient presents with  . Follow-up    chemo-induced cardiomyopathy    History of Present Illness:    Joseph Powell is a 64 y.o. 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 He was referred to cardio-oncology with Dr. Aundra Dubin for evaluation for small pericardial effusion, last seen 10/13/2017. Serial echos did show abnormal GLS, which was normalized on last echo in 2020. At that time, Dr. Aundra Dubin recommended repeat echo in 6 months to rule out late effects of doxorubicin - if normal, may stop coreg. He also has a history of HLD, GERD, asthma, and OSA. He had one presentation to the ER in 2018 for chest pain, negative enzymes and nonischemic EKG. OP moview was negative for reversible for ischemia.  He and his wife tested positive for COVID-19 in Oct 2020. They had a mild course with loss of taste and mild congestion.   He returns today for follow up. He has no cardiac complaints and generally feels well. We discussed Dr. Claris Gladden recommendations of repeating one more echo. If normal, may discontinue carvedilol.    Past Medical History:  Diagnosis Date  . Allergic rhinitis   . Asthma    slight  . Bell's palsy   . GERD (gastroesophageal reflux disease)   . Gout   . Hypercholesteremia   . Melanoma ([place])   . Osteoarthritis   . Sleep apnea    Resolving  . Thrombocytopenia ([place])   . Tubular adenoma     Past Surgical History:  Procedure Laterality Date  . IR FLUORO GUIDE PORT INSERTION RIGHT  02/17/2017  . IR REMOVAL TUN ACCESS W/ PORT W/O FL MOD SED  01/05/2018  . IR US GUIDE VASC ACCESS RIGHT  02/17/2017  . KNEE SURGERY  1993  . MELANOMA EXCISION     . NASAL RECONSTRUCTION    . VASECTOMY  1999    Current Medications: Current Meds  Medication Sig  . albuterol (PROVENTIL HFA;VENTOLIN HFA) 108 (90 Base) MCG/ACT inhaler Inhale 1-2 puffs into the lungs every 6 (six) hours as needed for wheezing or shortness of breath.  . carvedilol (COREG) 3.125 MG tablet TAKE 1 TABLET BY MOUTH TWICE DAILY WITH MEALS  . desloratadine (CLARINEX) 5 MG tablet Take 5 mg by mouth daily.  . ergocalciferol (VITAMIN D2) 1.25 MG (50000 UT) capsule Take 1 capsule (50,000 Units total) by mouth once a week.  . mometasone-formoterol (DULERA) 100-5 MCG/ACT AERO Inhale 2 puffs into the lungs 2 (two) times daily.  . traZODone (DESYREL) 50 MG tablet Take 1-2 tablets (50-100 mg total) by mouth at bedtime as needed.   Current Facility-Administered Medications for the 06/30/19 encounter (Office Visit) with Ledora Bottcher, PA  Medication  . 0.9 %  sodium chloride infusion     Allergies:   Penicillamine and Penicillins   Social History   Socioeconomic History  . Marital status: Married    Spouse name: Not on file  . Number of children: Not on file  . Years of education: Not on file  . Highest education level: Not on file  Occupational History  . Occupation: Risk analyst  Tobacco Use  . Smoking  status: Never Smoker  . Smokeless tobacco: Never Used  Substance and Sexual Activity  . Alcohol use: Yes    Alcohol/week: 2.0 standard drinks    Types: 2 Standard drinks or equivalent per week    Comment: socially  . Drug use: No  . Sexual activity: Not on file  Other Topics Concern  . Not on file  Social History Narrative  . Not on file   Social Determinants of Health   Financial Resource Strain:   . Difficulty of Paying Living Expenses: Not on file  Food Insecurity:   . Worried About Charity fundraiser in the Last Year: Not on file  . Ran Out of Food in the Last Year: Not on file  Transportation Needs:   . Lack of Transportation (Medical): Not on file   . Lack of Transportation (Non-Medical): Not on file  Physical Activity:   . Days of Exercise per Week: Not on file  . Minutes of Exercise per Session: Not on file  Stress:   . Feeling of Stress : Not on file  Social Connections:   . Frequency of Communication with Friends and Family: Not on file  . Frequency of Social Gatherings with Friends and Family: Not on file  . Attends Religious Services: Not on file  . Active Member of Clubs or Organizations: Not on file  . Attends Archivist Meetings: Not on file  . Marital Status: Not on file     Family History: The patient's family history includes Asthma in his mother; Colon cancer in his paternal uncle; Diabetes in his mother; Gout in his brother. There is no history of Esophageal cancer or Stomach cancer.  ROS:   Please see the history of present illness.     All other systems reviewed and are negative.  EKGs/Labs/Other Studies Reviewed:    The following studies were reviewed today:  Echo 11/2018: 1. The left ventricle has normal systolic function, with an ejection  fraction of 55-60%. The cavity size was normal. Left ventricular diastolic  parameters were normal. No evidence of left ventricular regional wall  motion abnormalities.  2. The right ventricle has normal systolic function. The cavity was  mildly enlarged. There is no increase in right ventricular wall thickness.  Right ventricular systolic pressure could not be assessed.  3. No evidence of mitral valve stenosis.  4. The aortic valve is grossly normal. No stenosis of the aortic valve.  5. The aortic root, ascending aorta and aortic arch are normal in size  and structure.   EKG:  EKG is  ordered today.  The ekg ordered today demonstrates sinus rhythm with HR 84, poor R wave progression - stable from prior  Recent Labs: 06/24/2019: ALT 15; BUN 10; Creatinine 0.80; Hemoglobin 14.5; Platelets 94; Potassium 4.1; Sodium 142  Recent Lipid Panel      Component Value Date/Time   CHOL 111 07/28/2016 0507   TRIG 288 (H) 07/28/2016 0507   HDL 11 (L) 07/28/2016 0507   CHOLHDL 10.1 07/28/2016 0507   VLDL 58 (H) 07/28/2016 0507   LDLCALC 42 07/28/2016 0507    Physical Exam:    VS:  BP 138/88   Pulse 84   Ht 5\' 11"  (1.803 m)   Wt 204 lb 9.6 oz (92.8 kg)   BMI 28.54 kg/m     Wt Readings from Last 3 Encounters:  06/30/19 204 lb 9.6 oz (92.8 kg)  06/24/19 201 lb 3.2 oz (91.3 kg)  12/23/18 206 lb  12.8 oz (93.8 kg)     GEN:  Well nourished, well developed in no acute distress HEENT: Normal NECK: No JVD; No carotid bruits LYMPHATICS: No lymphadenopathy CARDIAC: RRR, no murmurs, rubs, gallops RESPIRATORY:  Clear to auscultation without rales, wheezing or rhonchi  ABDOMEN: Soft, non-tender, non-distended MUSCULOSKELETAL:  No edema; No deformity  SKIN: Warm and dry NEUROLOGIC:  Alert and oriented x 3 PSYCHIATRIC:  Normal affect   ASSESSMENT:    1. Chemotherapy induced cardiomyopathy ([place])   2. Mixed hyperlipidemia    PLAN:    In order of problems listed above:  Chemotherapy induced Cardiomyopathy Hx of pericardial effusion, resolved - chemo regimen included doxorubicin - echo with concern for abnormal GLS - started on coreg 3.125 mg BID - last echo 11/2018 with normal EF and normal GLS - reviewed case with Dr. Aundra Dubin - will repeat one more echo. If normal, may D/C coreg   Hyperlipidemia Per [facility] - 01/2019 Total chol 190 HDL 37 LDL 134 Trig 96 Followed by PCP. No history of CAD.    Follow up with Dr. Sallyanne Kuster in 1 year.    Medication Adjustments/Labs and Tests Ordered: Current medicines are reviewed at length with the patient today.  Concerns regarding medicines are outlined above.  Orders Placed This Encounter  Procedures  . EKG 12-Lead  . ECHOCARDIOGRAM COMPLETE   No orders of the defined types were placed in this encounter.   Signed, Ledora Bottcher, PA  06/30/2019 4:10 PM    [facility]

## 2019-06-30 ENCOUNTER — Other Ambulatory Visit: Payer: Self-pay

## 2019-06-30 ENCOUNTER — Encounter: Payer: Self-pay | Admitting: Physician Assistant

## 2019-06-30 ENCOUNTER — Ambulatory Visit (INDEPENDENT_AMBULATORY_CARE_PROVIDER_SITE_OTHER): Payer: BC Managed Care – PPO | Admitting: Physician Assistant

## 2019-06-30 VITALS — BP 138/88 | HR 84 | Ht 71.0 in | Wt 204.6 lb

## 2019-06-30 DIAGNOSIS — T451X5A Adverse effect of antineoplastic and immunosuppressive drugs, initial encounter: Secondary | ICD-10-CM

## 2019-06-30 DIAGNOSIS — E782 Mixed hyperlipidemia: Secondary | ICD-10-CM | POA: Diagnosis not present

## 2019-06-30 DIAGNOSIS — I427 Cardiomyopathy due to drug and external agent: Secondary | ICD-10-CM

## 2019-06-30 NOTE — Patient Instructions (Signed)
Medication Instructions:  Continue current medications  *If you need a refill on your cardiac medications before your next appointment, please call your pharmacy*  Lab Work: None Ordered  Testing/Procedures: Your physician has requested that you have an echocardiogram. Echocardiography is a painless test that uses sound waves to create images of your heart. It provides your doctor with information about the size and shape of your heart and how well your heart's chambers and valves are working. This procedure takes approximately one hour. There are no restrictions for this procedure.  Follow-Up: At [facility], you and your health needs are our priority.  As part of our continuing mission to provide you with exceptional heart care, we have created designated Provider Care Teams.  These Care Teams include your primary Cardiologist (physician) and Advanced Practice Providers (APPs -  Physician Assistants and Nurse Practitioners) who all work together to provide you with the care you need, when you need it.  Your next appointment:   1 year(s)  The format for your next appointment:   In Person  Provider:   You may see Sanda Klein, MD or one of the following Advanced Practice Providers on your designated Care Team:    Almyra Deforest, PA-C  Fabian Sharp, PA-C or   Roby Lofts, [place]

## 2019-08-09 DIAGNOSIS — C8590 Non-Hodgkin lymphoma, unspecified, unspecified site: Secondary | ICD-10-CM | POA: Diagnosis not present

## 2019-08-11 DIAGNOSIS — Z48290 Encounter for aftercare following bone marrow transplant: Secondary | ICD-10-CM | POA: Diagnosis not present

## 2019-08-11 DIAGNOSIS — Z9221 Personal history of antineoplastic chemotherapy: Secondary | ICD-10-CM | POA: Diagnosis not present

## 2019-08-11 DIAGNOSIS — K429 Umbilical hernia without obstruction or gangrene: Secondary | ICD-10-CM | POA: Diagnosis not present

## 2019-08-11 DIAGNOSIS — K769 Liver disease, unspecified: Secondary | ICD-10-CM | POA: Diagnosis not present

## 2019-08-11 DIAGNOSIS — M47812 Spondylosis without myelopathy or radiculopathy, cervical region: Secondary | ICD-10-CM | POA: Diagnosis not present

## 2019-08-11 DIAGNOSIS — D696 Thrombocytopenia, unspecified: Secondary | ICD-10-CM | POA: Diagnosis not present

## 2019-08-11 DIAGNOSIS — Z8572 Personal history of non-Hodgkin lymphomas: Secondary | ICD-10-CM | POA: Diagnosis not present

## 2019-08-11 DIAGNOSIS — Z9484 Stem cells transplant status: Secondary | ICD-10-CM | POA: Diagnosis not present

## 2019-08-11 DIAGNOSIS — C8448 Peripheral T-cell lymphoma, not classified, lymph nodes of multiple sites: Secondary | ICD-10-CM | POA: Diagnosis not present

## 2019-08-11 DIAGNOSIS — N200 Calculus of kidney: Secondary | ICD-10-CM | POA: Diagnosis not present

## 2019-08-17 DIAGNOSIS — R197 Diarrhea, unspecified: Secondary | ICD-10-CM | POA: Diagnosis not present

## 2019-08-18 ENCOUNTER — Other Ambulatory Visit: Payer: Self-pay | Admitting: Hematology

## 2019-08-18 DIAGNOSIS — R197 Diarrhea, unspecified: Secondary | ICD-10-CM | POA: Diagnosis not present

## 2019-08-18 DIAGNOSIS — C8498 Mature T/NK-cell lymphomas, unspecified, lymph nodes of multiple sites: Secondary | ICD-10-CM

## 2019-09-09 DIAGNOSIS — C8448 Peripheral T-cell lymphoma, not classified, lymph nodes of multiple sites: Secondary | ICD-10-CM | POA: Diagnosis not present

## 2019-09-09 DIAGNOSIS — Z9484 Stem cells transplant status: Secondary | ICD-10-CM | POA: Diagnosis not present

## 2019-09-09 DIAGNOSIS — Z23 Encounter for immunization: Secondary | ICD-10-CM | POA: Diagnosis not present

## 2019-09-24 ENCOUNTER — Other Ambulatory Visit (HOSPITAL_COMMUNITY): Payer: Self-pay | Admitting: Cardiology

## 2019-11-23 DIAGNOSIS — M25562 Pain in left knee: Secondary | ICD-10-CM | POA: Diagnosis not present

## 2019-11-23 DIAGNOSIS — M1712 Unilateral primary osteoarthritis, left knee: Secondary | ICD-10-CM | POA: Diagnosis not present

## 2019-11-29 ENCOUNTER — Ambulatory Visit (HOSPITAL_COMMUNITY): Payer: BC Managed Care – PPO | Attending: Cardiology

## 2019-11-29 ENCOUNTER — Other Ambulatory Visit: Payer: Self-pay

## 2019-11-29 DIAGNOSIS — T451X5A Adverse effect of antineoplastic and immunosuppressive drugs, initial encounter: Secondary | ICD-10-CM | POA: Diagnosis not present

## 2019-11-29 DIAGNOSIS — I427 Cardiomyopathy due to drug and external agent: Secondary | ICD-10-CM | POA: Diagnosis not present

## 2019-11-29 LAB — ECHOCARDIOGRAM COMPLETE
Area-P 1/2: 2.96 cm2
S' Lateral: 2.9 cm

## 2019-11-30 DIAGNOSIS — M1712 Unilateral primary osteoarthritis, left knee: Secondary | ICD-10-CM | POA: Diagnosis not present

## 2019-12-01 ENCOUNTER — Other Ambulatory Visit: Payer: Self-pay | Admitting: *Deleted

## 2019-12-06 ENCOUNTER — Other Ambulatory Visit: Payer: Self-pay | Admitting: Internal Medicine

## 2019-12-07 DIAGNOSIS — M1712 Unilateral primary osteoarthritis, left knee: Secondary | ICD-10-CM | POA: Diagnosis not present

## 2019-12-08 ENCOUNTER — Telehealth: Payer: Self-pay | Admitting: Physician Assistant

## 2019-12-08 NOTE — Telephone Encounter (Signed)
Patient called in to office to go over Echo results and him stopping the coreg. Patient made aware of echo results and stated that he had stopped his coreg several days ago when he saw his results and the message in [facility]. He stated that since he stopped it his blood pressure has went from the 1 teens to 127'K systolic to now in the 718'D systolic. He has also  noticed he has had a few headaches that are alleviated by tylenol but can not confirm if they are associated with the rise in his blood pressure or not. Patient denies chest pain and dizziness. Patient wanted some guidance on this issue.   Will forward to Dr Loletha Grayer, and Fabian Sharp for advice.

## 2019-12-08 NOTE — Telephone Encounter (Signed)
OK to use Coreg for BP control, would like to see SBP < 130.  Can restart Coreg 6.25 mg bid.

## 2019-12-08 NOTE — Telephone Encounter (Signed)
Patient calling to discuss his recent echo results.

## 2019-12-08 NOTE — Telephone Encounter (Signed)
Defer to Dr. Aundra Dubin. I have not seen him since 2018 and am not up to date with his current issues.

## 2019-12-09 MED ORDER — CARVEDILOL 3.125 MG PO TABS: 3.1250 mg | ORAL_TABLET | Freq: Two times a day (BID) | ORAL | 3 refills | Status: DC

## 2019-12-09 NOTE — Telephone Encounter (Signed)
Patient advised and verbalized understanding. New Rx sent into patients pharmacy.   Meds ordered this encounter  Medications  . carvedilol (COREG) 3.125 MG tablet    Sig: Take 1 tablet (3.125 mg total) by mouth 2 (two) times daily with a meal.    Dispense:  180 tablet    Refill:  3    Please cancel all previous orders for current medication. Change in dosage or pill size.  Marland Kitchen

## 2019-12-09 NOTE — Addendum Note (Signed)
Addended by: Shela Nevin R on: 07/07/5972 05:50 PM   Modules accepted: Orders

## 2019-12-09 NOTE — Telephone Encounter (Signed)
He can restart Coreg 3.125 mg bid, would like to see SBP < 130.

## 2019-12-13 ENCOUNTER — Telehealth: Payer: Self-pay | Admitting: Internal Medicine

## 2019-12-13 MED ORDER — DULERA 100-5 MCG/ACT IN AERO: 2.0000 | INHALATION_SPRAY | Freq: Two times a day (BID) | RESPIRATORY_TRACT | 0 refills | Status: DC

## 2019-12-13 NOTE — Telephone Encounter (Signed)
Refill of pt's dulera has been sent to preferred pharmacy for pt. Attempted to call pt to let him know this had been done but unable to reach. Left pt a detailed message that his Dulera inhaler had been refilled. Nothing further needed.

## 2019-12-13 NOTE — Telephone Encounter (Signed)
Pt has not been seen at office since 06/2017.  Pt does have a f/u appt scheduled with TP 8/23.  Called and spoke with pt letting him know that we would need to check with MW to see if he is okay with [place] refilling his [place] Mannan since it has been 2 years since he was last seen at office and he verbalized understanding.  Dr. Melvyn Novas, please advise if you are okay with [place] refilling pt's [facility] as he has an appt scheduled.

## 2019-12-13 NOTE — Telephone Encounter (Signed)
Since has appt ok x one refill only

## 2019-12-16 ENCOUNTER — Telehealth: Payer: Self-pay | Admitting: Hematology

## 2019-12-16 NOTE — Telephone Encounter (Signed)
Rescheduled 08/18 to 09/22 appointment per scheduling, called patient and left a voicemail.

## 2019-12-22 ENCOUNTER — Other Ambulatory Visit: Payer: BC Managed Care – PPO

## 2019-12-22 ENCOUNTER — Ambulatory Visit: Payer: BC Managed Care – PPO | Admitting: Hematology

## 2019-12-23 ENCOUNTER — Ambulatory Visit: Payer: BC Managed Care – PPO | Admitting: Adult Health

## 2019-12-26 ENCOUNTER — Other Ambulatory Visit: Payer: Self-pay

## 2019-12-26 ENCOUNTER — Ambulatory Visit (INDEPENDENT_AMBULATORY_CARE_PROVIDER_SITE_OTHER): Payer: BC Managed Care – PPO | Admitting: Adult Health

## 2019-12-26 ENCOUNTER — Encounter: Payer: Self-pay | Admitting: Adult Health

## 2019-12-26 DIAGNOSIS — J45991 Cough variant asthma: Secondary | ICD-10-CM

## 2019-12-26 MED ORDER — ALBUTEROL SULFATE HFA 108 (90 BASE) MCG/ACT IN AERS: 1.0000 | INHALATION_SPRAY | Freq: Four times a day (QID) | RESPIRATORY_TRACT | 4 refills | Status: DC | PRN

## 2019-12-26 MED ORDER — DULERA 100-5 MCG/ACT IN AERO: 2.0000 | INHALATION_SPRAY | Freq: Two times a day (BID) | RESPIRATORY_TRACT | 12 refills | Status: DC

## 2019-12-26 NOTE — Assessment & Plan Note (Signed)
Good control on Dulera no changes  Plan  Patient Instructions  Continue on Dulera 2 puffs Twice daily , rinse after use.  Albuterol inhaler As needed   Follow up with Dr. Melvyn Novas  In 1 year and As needed

## 2019-12-26 NOTE — Patient Instructions (Signed)
Continue on Dulera 2 puffs Twice daily , rinse after use.  Albuterol inhaler As needed   Follow up with Dr. Melvyn Novas  In 1 year and As needed

## 2019-12-26 NOTE — Progress Notes (Signed)
@Patient  ID: Joseph Powell, male    DOB: May 19, 1955, 64 y.o.   MRN: [id number]  Chief Complaint  Patient presents with   Follow-up    Cough     Referring provider: Hulan Fess, MD  HPI: 64 year old male followed for asthma and allergies Medical history significant for high-grade T-cell non-Hodgkin's lymphoma status post [facility] and stem cell transplant  TEST/EVENTS :  Spirometry 03/10/2017  FEV1 3.29 (89%)  Ratio 86 p am Dulera 100 x 2 pffs   12/26/2019 Follow up : Cough variant asthma Patient returns for a follow-up visit.  Last seen in 2019.  Patient says overall he has been doing well.  Patient remains on Dulera twice daily.  Patient says this helps him with his intermittent cough.  Patient denies any chest pain orthopnea PND or increased leg swelling. Says his activity level is starting to slowly improve.  Patient is status post treatment and stem cell transplant for non-Hodgkin's lymphoma.  He is followed at [facility].  CT chest abdomen and pelvis August 11, 2019 showed clear lungs.  With no evidence of new or enlarging lymph nodes.  Multiple focal lesions in the liver are unchanged. Patient denies any albuterol use.    Allergies  Allergen Reactions   Penicillamine    Penicillins Rash    Immunization History  Administered Date(s) Administered   DTaP / IPV 02/18/2018, 05/11/2018, 09/09/2018   Janssen (J&J) SARS-COV-2 Vaccination 08/11/2019   Pneumococcal Conjugate-13 02/18/2018, 05/11/2018, 09/09/2018   Pneumococcal Polysaccharide-23 05/05/2010    Past Medical History:  Diagnosis Date   Allergic rhinitis    Asthma    slight   Bell's palsy    GERD (gastroesophageal reflux disease)    Gout    Hypercholesteremia    Melanoma ([facility])    Osteoarthritis    Sleep apnea    Resolving   Thrombocytopenia ([facility])    Tubular adenoma     Tobacco History: Social History   Tobacco Use  Smoking Status Never Smoker  Smokeless Tobacco Never Used    Counseling given: Not Answered   Outpatient Medications Prior to Visit  Medication Sig Dispense Refill   carvedilol (COREG) 3.125 MG tablet Take 1 tablet (3.125 mg total) by mouth 2 (two) times daily with a meal. 180 tablet 3   desloratadine (CLARINEX) 5 MG tablet Take 5 mg by mouth daily.     ergocalciferol (VITAMIN D2) 1.25 MG (50000 UT) capsule Take 1 capsule (50,000 Units total) by mouth once a week. 12 capsule 2   albuterol (PROVENTIL HFA;VENTOLIN HFA) 108 (90 Base) MCG/ACT inhaler Inhale 1-2 puffs into the lungs every 6 (six) hours as needed for wheezing or shortness of breath.     mometasone-formoterol (DULERA) 100-5 MCG/ACT AERO Inhale 2 puffs into the lungs 2 (two) times daily. 13 g 0   traZODone (DESYREL) 50 MG tablet TAKE 1 TO 2 TABLETS BY MOUTH AT BEDTIME AS NEEDED 60 tablet 0   Facility-Administered Medications Prior to Visit  Medication Dose Route Frequency Provider Last Rate Last Admin   0.9 %  sodium chloride infusion  500 mL Intravenous Continuous Milus Banister, MD         Review of Systems:   Constitutional:   No  weight loss, night sweats,  Fevers, chills, fatigue, or  lassitude.  HEENT:   No headaches,  Difficulty swallowing,  Tooth/dental problems, or  Sore throat,                No sneezing, itching,  ear ache, nasal congestion, post nasal drip,   CV:  No chest pain,  Orthopnea, PND, swelling in lower extremities, anasarca, dizziness, palpitations, syncope.   GI  No heartburn, indigestion, abdominal pain, nausea, vomiting, diarrhea, change in bowel habits, loss of appetite, bloody stools.   Resp: No shortness of breath with exertion or at rest.  No excess mucus, no productive cough,  No non-productive cough,  No coughing up of blood.  No change in color of mucus.  No wheezing.  No chest wall deformity  Skin: no rash or lesions.  GU: no dysuria, change in color of urine, no urgency or frequency.  No flank pain, no hematuria   MS:  No joint pain or  swelling.  No decreased range of motion.  No back pain.    Physical Exam  BP (!) 142/82 (BP Location: Left Arm, Cuff Size: Normal)    Pulse 74    Temp (!) 97 F (36.1 C) (Other (Comment)) Comment (Src): wrist   Ht 5\' 11"  (1.803 m)    Wt 204 lb 12.8 oz (92.9 kg)    SpO2 98% Comment: Room air   BMI 28.56 kg/m   GEN: A/Ox3; pleasant , NAD, well nourished    HEENT:  [place]/AT,   NOSE-clear, THROAT-clear, no lesions, no postnasal drip or exudate noted.   NECK:  Supple w/ fair ROM; no JVD; normal carotid impulses w/o bruits; no thyromegaly or nodules palpated; no lymphadenopathy.    RESP  Clear  P & A; w/o, wheezes/ rales/ or rhonchi. no accessory muscle use, no dullness to percussion  CARD:  RRR, no m/r/g, no peripheral edema, pulses intact, no cyanosis or clubbing.  GI:   Soft & nt; nml bowel sounds; no organomegaly or masses detected.   Musco: Warm bil, no deformities or joint swelling noted.   Neuro: alert, no focal deficits noted.    Skin: Warm, no lesions or rashes    Lab Results:   BMET  BNP No results found for: BNP  ProBNP No results found for: PROBNP  Imaging: ECHOCARDIOGRAM COMPLETE  Result Date: 11/29/2019    ECHOCARDIOGRAM REPORT   Patient Name:   Joseph Powell Date of Exam: 11/29/2019 Medical Rec #:  [id number]         Height:       71.0 in Accession #:    [id number]        Weight:       204.6 lb Date of Birth:  July 04, 1955         BSA:          2.129 m Patient Age:    51 years          BP:           129/77 mmHg Patient Gender: M                 HR:           67 bpm. Exam Location:  [place] Procedure: 2D Echo, Cardiac Doppler, Color Doppler, Strain Analysis and 3D Echo Indications:    I42.7  History:        Patient has prior history of Echocardiogram examinations, most                 recent 11/22/2018. Chemo induced cardiomyopathy; Risk                 Factors:Dyslipidemia.  Sonographer:    Coralyn Helling RDCS Referring Phys: [id number] [place]  IMPRESSIONS  1. Low normal LV systolic function; mild LVH; grade 1 diastolic dysfunction; GLS -16.2%.  2. Left ventricular ejection fraction, by estimation, is 50 to 55%. The left ventricle has low normal function. The left ventricle has no regional wall motion abnormalities. There is mild left ventricular hypertrophy. Left ventricular diastolic parameters are consistent with Grade I diastolic dysfunction (impaired relaxation).  3. Right ventricular systolic function is normal. The right ventricular size is normal.  4. The mitral valve is normal in structure. Trivial mitral valve regurgitation. No evidence of mitral stenosis.  5. The aortic valve is tricuspid. Aortic valve regurgitation is not visualized. No aortic stenosis is present.  6. The inferior vena cava is normal in size with greater than 50% respiratory variability, suggesting right atrial pressure of 3 mmHg. FINDINGS  Left Ventricle: Left ventricular ejection fraction, by estimation, is 50 to 55%. The left ventricle has low normal function. The left ventricle has no regional wall motion abnormalities. The left ventricular internal cavity size was normal in size. There is mild left ventricular hypertrophy. Left ventricular diastolic parameters are consistent with Grade I diastolic dysfunction (impaired relaxation). Right Ventricle: The right ventricular size is normal.Right ventricular systolic function is normal. Left Atrium: Left atrial size was normal in size. Right Atrium: Right atrial size was normal in size. Pericardium: There is no evidence of pericardial effusion. Mitral Valve: The mitral valve is normal in structure. Normal mobility of the mitral valve leaflets. Mild mitral annular calcification. Trivial mitral valve regurgitation. No evidence of mitral valve stenosis. Tricuspid Valve: The tricuspid valve is normal in structure. Tricuspid valve regurgitation is trivial. No evidence of tricuspid stenosis. Aortic Valve: The aortic valve is  tricuspid. Aortic valve regurgitation is not visualized. No aortic stenosis is present. Pulmonic Valve: The pulmonic valve was normal in structure. Pulmonic valve regurgitation is trivial. No evidence of pulmonic stenosis. Aorta: The aortic root is normal in size and structure. Venous: The inferior vena cava is normal in size with greater than 50% respiratory variability, suggesting right atrial pressure of 3 mmHg. IAS/Shunts: No atrial level shunt detected by color flow Doppler. Additional Comments: Low normal LV systolic function; mild LVH; grade 1 diastolic dysfunction; GLS -16.2%.  LEFT VENTRICLE PLAX 2D LVIDd:         4.00 cm  Diastology LVIDs:         2.90 cm  LV e' lateral:   7.51 cm/s LV PW:         1.20 cm  LV E/e' lateral: 8.6 LV IVS:        1.40 cm  LV e' medial:    6.85 cm/s LVOT diam:     2.20 cm  LV E/e' medial:  9.5 LV SV:         58 LV SV Index:   27 LVOT Area:     3.80 cm                          3D Volume EF:                         3D EF:        55 %                         LV EDV:       127 ml  LV ESV:       57 ml                         LV SV:        70 ml RIGHT VENTRICLE            IVC RV S prime:     8.05 cm/s  IVC diam: 1.80 cm TAPSE (M-mode): 1.5 cm LEFT ATRIUM             Index       RIGHT ATRIUM           Index LA diam:        3.80 cm 1.78 cm/m  RA Pressure: 3.00 mmHg LA Vol (A2C):   38.5 ml 18.08 ml/m RA Area:     15.00 cm LA Vol (A4C):   45.2 ml 21.23 ml/m RA Volume:   38.10 ml  17.90 ml/m LA Biplane Vol: 42.0 ml 19.73 ml/m  AORTIC VALVE LVOT Vmax:   69.10 cm/s LVOT Vmean:  52.300 cm/s LVOT VTI:    0.153 m  AORTA Ao Root diam: 3.20 cm Ao Asc diam:  3.60 cm MV E velocity: 64.90 cm/s  TRICUSPID VALVE MV A velocity: 75.80 cm/s  Estimated RAP:  3.00 mmHg MV E/A ratio:  0.86                            SHUNTS                            Systemic VTI:  0.15 m                            Systemic Diam: 2.20 cm Kirk Ruths MD Electronically signed by Kirk Ruths  MD Signature Date/Time: 11/29/2019/1:04:23 PM    Final       No flowsheet data found.  No results found for: NITRICOXIDE      Assessment & Plan:   Cough variant asthma vs UACS Good control on Dulera no changes  Plan  Patient Instructions  Continue on Dulera 2 puffs Twice daily , rinse after use.  Albuterol inhaler As needed   Follow up with Dr. Melvyn Novas  In 1 year and As needed           Rexene Edison, NP 12/26/2019

## 2019-12-30 ENCOUNTER — Telehealth: Payer: Self-pay | Admitting: Adult Health

## 2019-12-30 NOTE — Telephone Encounter (Signed)
We sent rx for pt's [facility] 8/23  I called [facility] and they stated they do have rx and he can pick up  Called and left detailed msg for the pt letting him know about this ok per [facility]

## 2020-01-25 ENCOUNTER — Other Ambulatory Visit: Payer: Self-pay

## 2020-01-25 ENCOUNTER — Inpatient Hospital Stay (HOSPITAL_BASED_OUTPATIENT_CLINIC_OR_DEPARTMENT_OTHER): Payer: BC Managed Care – PPO | Admitting: Hematology

## 2020-01-25 ENCOUNTER — Inpatient Hospital Stay: Payer: BC Managed Care – PPO | Attending: Hematology

## 2020-01-25 VITALS — BP 138/84 | HR 79 | Temp 96.9°F | Resp 18 | Ht 71.0 in | Wt 207.6 lb

## 2020-01-25 DIAGNOSIS — Z85820 Personal history of malignant melanoma of skin: Secondary | ICD-10-CM | POA: Insufficient documentation

## 2020-01-25 DIAGNOSIS — C8498 Mature T/NK-cell lymphomas, unspecified, lymph nodes of multiple sites: Secondary | ICD-10-CM | POA: Diagnosis not present

## 2020-01-25 DIAGNOSIS — D696 Thrombocytopenia, unspecified: Secondary | ICD-10-CM

## 2020-01-25 DIAGNOSIS — C8442 Peripheral T-cell lymphoma, not classified, intrathoracic lymph nodes: Secondary | ICD-10-CM | POA: Diagnosis not present

## 2020-01-25 DIAGNOSIS — E559 Vitamin D deficiency, unspecified: Secondary | ICD-10-CM

## 2020-01-25 DIAGNOSIS — Z79899 Other long term (current) drug therapy: Secondary | ICD-10-CM | POA: Insufficient documentation

## 2020-01-25 DIAGNOSIS — M109 Gout, unspecified: Secondary | ICD-10-CM | POA: Diagnosis not present

## 2020-01-25 DIAGNOSIS — K219 Gastro-esophageal reflux disease without esophagitis: Secondary | ICD-10-CM | POA: Diagnosis not present

## 2020-01-25 LAB — CBC WITH DIFFERENTIAL/PLATELET
Abs Immature Granulocytes: 0.18 10*3/uL — ABNORMAL HIGH (ref 0.00–0.07)
Basophils Absolute: 0 10*3/uL (ref 0.0–0.1)
Basophils Relative: 0 %
Eosinophils Absolute: 0 10*3/uL (ref 0.0–0.5)
Eosinophils Relative: 1 %
HCT: 42 % (ref 39.0–52.0)
Hemoglobin: 14.5 g/dL (ref 13.0–17.0)
Immature Granulocytes: 3 %
Lymphocytes Relative: 17 %
Lymphs Abs: 1.2 10*3/uL (ref 0.7–4.0)
MCH: 32.1 pg (ref 26.0–34.0)
MCHC: 34.5 g/dL (ref 30.0–36.0)
MCV: 92.9 fL (ref 80.0–100.0)
Monocytes Absolute: 1.7 10*3/uL — ABNORMAL HIGH (ref 0.1–1.0)
Monocytes Relative: 24 %
Neutro Abs: 3.8 10*3/uL (ref 1.7–7.7)
Neutrophils Relative %: 55 %
Platelets: 97 10*3/uL — ABNORMAL LOW (ref 150–400)
RBC: 4.52 MIL/uL (ref 4.22–5.81)
RDW: 13.2 % (ref 11.5–15.5)
WBC: 6.9 10*3/uL (ref 4.0–10.5)
nRBC: 0 % (ref 0.0–0.2)

## 2020-01-25 LAB — CMP (CANCER CENTER ONLY)
ALT: 18 U/L (ref 0–44)
AST: 18 U/L (ref 15–41)
Albumin: 4.2 g/dL (ref 3.5–5.0)
Alkaline Phosphatase: 107 U/L (ref 38–126)
Anion gap: 5 (ref 5–15)
BUN: 10 mg/dL (ref 8–23)
CO2: 29 mmol/L (ref 22–32)
Calcium: 9.3 mg/dL (ref 8.9–10.3)
Chloride: 109 mmol/L (ref 98–111)
Creatinine: 0.92 mg/dL (ref 0.61–1.24)
GFR, Est AFR Am: >60 mL/min (ref >60)
GFR, Estimated: >60 mL/min (ref >60)
Glucose, Bld: 137 mg/dL — ABNORMAL HIGH (ref 70–99)
Potassium: 3.9 mmol/L (ref 3.5–5.1)
Sodium: 143 mmol/L (ref 135–145)
Total Bilirubin: 0.6 mg/dL (ref 0.3–1.2)
Total Protein: 6.7 g/dL (ref 6.5–8.1)

## 2020-01-25 LAB — LACTATE DEHYDROGENASE: LDH: 170 U/L (ref 98–192)

## 2020-01-25 LAB — VITAMIN D 25 HYDROXY (VIT D DEFICIENCY, FRACTURES): Vit D, 25-Hydroxy: 47.43 ng/mL (ref 30–100)

## 2020-01-25 NOTE — Progress Notes (Signed)
HEMATOLOGY/ONCOLOGY CLINIC NOTE  Date of Service: 01/25/20   Patient Care Team: Hulan Fess, MD as PCP - General (Family Medicine) Croitoru, Dani Gobble, MD as PCP - Cardiology (Cardiology)  CHIEF COMPLAINTS/PURPOSE OF CONSULTATION:   F/u for high gradeT cell NHL  CURRENT THERAPY: post BMT active surveillance  Previous Treatment; R-CHOEP x 6 cycles BEAM -Auto HSCT  HISTORY OF PRESENTING ILLNESS:  See previous note for details  INTERVAL HISTORY: Joseph Powell is a wonderful 64 y.o. male here for a F/U of high grade T cell NHL after 6 cycles of R-CHOP, s/p transplant. We are joined today by his wife Joseph Powell, via phone. The patient's last visit with [place] was on 06/24/2019. The pt reports that he is doing well overall.  The pt reports that he has been feeling and eating well. He is no longer taking Trazodone. His sleeping has improved overall, but he continues to wake in the night regularly. Pt began experiencing migraines with flashing lights after attempting to stop Carvedilol. He has since restarted. The symptoms have resolved.  Pt is considering getting a dental transplant. He continues taking Ergocalciferol once per week. Pt received the J&J COVID19 vaccine in April. He has received his annual flu vaccine.   Lab results today (01/25/20) of CBC w/diff and CMP is as follows: all values are WNL except for PLT at 97K, Mono Abs at 1.7K, Abs Immature Granulocytes at 0.18K, Glucose at 137. 01/25/2020 Vitamin D 25 (OH) is in progress 01/25/2020 LDH at 170  On review of systems, pt reports awaking from sleep frequently and denies fevers, chills, night sweats, unexpected weight loss, headaches, new lumps/bumps, rash, bowel habit changes, urinary habit changes, numbness/tingling in hands/feet, skin lesions, abnormal/excessive bruising, abdominal pain and any other symptoms.    MEDICAL HISTORY:  Past Medical History:  Diagnosis Date  . Allergic rhinitis   . Asthma    slight  . Bell's  palsy   . GERD (gastroesophageal reflux disease)   . Gout   . Hypercholesteremia   . Melanoma ([place])   . Osteoarthritis   . Sleep apnea    Resolving  . Thrombocytopenia ([place])   . Tubular adenoma     SURGICAL HISTORY: Past Surgical History:  Procedure Laterality Date  . IR FLUORO GUIDE PORT INSERTION RIGHT  02/17/2017  . IR REMOVAL TUN ACCESS W/ PORT W/O FL MOD SED  01/05/2018  . IR US GUIDE VASC ACCESS RIGHT  02/17/2017  . KNEE SURGERY  1993  . MELANOMA EXCISION    . NASAL RECONSTRUCTION    . VASECTOMY  1999    SOCIAL HISTORY: Social History   Socioeconomic History  . Marital status: Married    Spouse name: Not on file  . Number of children: Not on file  . Years of education: Not on file  . Highest education level: Not on file  Occupational History  . Occupation: Risk analyst  Tobacco Use  . Smoking status: Never Smoker  . Smokeless tobacco: Never Used  Vaping Use  . Vaping Use: Never used  Substance and Sexual Activity  . Alcohol use: Yes    Alcohol/week: 2.0 standard drinks    Types: 2 Standard drinks or equivalent per week    Comment: socially  . Drug use: No  . Sexual activity: Not on file  Other Topics Concern  . Not on file  Social History Narrative  . Not on file   Social Determinants of Health   Financial Resource Strain:   .  Difficulty of Paying Living Expenses: Not on file  Food Insecurity:   . Worried About Charity fundraiser in the Last Year: Not on file  . Ran Out of Food in the Last Year: Not on file  Transportation Needs:   . Lack of Transportation (Medical): Not on file  . Lack of Transportation (Non-Medical): Not on file  Physical Activity:   . Days of Exercise per Week: Not on file  . Minutes of Exercise per Session: Not on file  Stress:   . Feeling of Stress : Not on file  Social Connections:   . Frequency of Communication with Friends and Family: Not on file  . Frequency of Social Gatherings with Friends and Family: Not on file   . Attends Religious Services: Not on file  . Active Member of Clubs or Organizations: Not on file  . Attends Archivist Meetings: Not on file  . Marital Status: Not on file  Intimate Partner Violence:   . Fear of Current or Ex-Partner: Not on file  . Emotionally Abused: Not on file  . Physically Abused: Not on file  . Sexually Abused: Not on file    FAMILY HISTORY: Family History  Problem Relation Age of Onset  . Diabetes Mother   . Asthma Mother   . Gout Brother   . Colon cancer Paternal Uncle   . Esophageal cancer Neg Hx   . Stomach cancer Neg Hx     ALLERGIES:  is allergic to penicillamine and penicillins.  MEDICATIONS:  Current Outpatient Medications  Medication Sig Dispense Refill  . albuterol (VENTOLIN HFA) 108 (90 Base) MCG/ACT inhaler Inhale 1-2 puffs into the lungs every 6 (six) hours as needed for wheezing or shortness of breath. 18 g 4  . carvedilol (COREG) 3.125 MG tablet Take 1 tablet (3.125 mg total) by mouth 2 (two) times daily with a meal. 180 tablet 3  . desloratadine (CLARINEX) 5 MG tablet Take 5 mg by mouth daily.    . ergocalciferol (VITAMIN D2) 1.25 MG (50000 UT) capsule Take 1 capsule (50,000 Units total) by mouth once a week. 12 capsule 2  . mometasone-formoterol (DULERA) 100-5 MCG/ACT AERO Inhale 2 puffs into the lungs 2 (two) times daily. 13 g 12  . traZODone (DESYREL) 50 MG tablet TAKE 1 TO 2 TABLETS BY MOUTH AT BEDTIME AS NEEDED 60 tablet 0   Current Facility-Administered Medications  Medication Dose Route Frequency Provider Last Rate Last Admin  . 0.9 %  sodium chloride infusion  500 mL Intravenous Continuous Milus Banister, MD        REVIEW OF SYSTEMS:   A 10+ POINT REVIEW OF SYSTEMS WAS OBTAINED including neurology, dermatology, psychiatry, cardiac, respiratory, lymph, extremities, GI, GU, Musculoskeletal, constitutional, breasts, reproductive, HEENT.  All pertinent positives are noted in the HPI.  All others are negative.    PHYSICAL EXAMINATION: ECOG PERFORMANCE STATUS: 1 - Symptomatic but completely ambulatory  Vitals:   01/25/20 1513  BP: 138/84  Powell: 79  Resp: 18  Temp: (!) 96.9 F (36.1 C)  SpO2: 100%   Filed Weights   01/25/20 1513  Weight: 207 lb 9.6 oz (94.2 kg)   .Body mass index is 28.95 kg/m.   GENERAL:alert, in no acute distress and comfortable SKIN: no acute rashes, no significant lesions EYES: conjunctiva are pink and non-injected, sclera anicteric OROPHARYNX: MMM, no exudates, no oropharyngeal erythema or ulceration NECK: supple, no JVD LYMPH:  no palpable lymphadenopathy in the cervical, axillary or inguinal regions  LUNGS: clear to auscultation b/l with normal respiratory effort HEART: regular rate & rhythm ABDOMEN:  normoactive bowel sounds , non tender, not distended. No palpable hepatosplenomegaly.  Extremity: no pedal edema PSYCH: alert & oriented x 3 with fluent speech NEURO: no focal motor/sensory deficits  LABORATORY DATA:  I have reviewed the data as listed  . CBC Latest Ref Rng & Units 01/25/2020 06/24/2019 12/23/2018  WBC 4.0 - 10.5 K/uL 6.9 5.7 6.0  Hemoglobin 13.0 - 17.0 g/dL 14.5 14.5 14.4  Hematocrit 39 - 52 % 42.0 42.3 42.1  Platelets 150 - 400 K/uL 97(L) 94(L) 87(L)  ANC 8.6k . CMP Latest Ref Rng & Units 01/25/2020 06/24/2019 12/23/2018  Glucose 70 - 99 mg/dL 137(H) 119(H) 104(H)  BUN 8 - 23 mg/dL 10 10 8   Creatinine 0.61 - 1.24 mg/dL 0.92 0.80 0.86  Sodium 135 - 145 mmol/L 143 142 142  Potassium 3.5 - 5.1 mmol/L 3.9 4.1 3.9  Chloride 98 - 111 mmol/L 109 109 106  CO2 22 - 32 mmol/L 29 24 26   Calcium 8.9 - 10.3 mg/dL 9.3 9.0 8.9  Total Protein 6.5 - 8.1 g/dL 6.7 6.7 6.5  Total Bilirubin 0.3 - 1.2 mg/dL 0.6 0.8 0.4  Alkaline Phos 38 - 126 U/L 107 105 95  AST 15 - 41 U/L 18 13(L) 19  ALT 0 - 44 U/L 18 15 19    Uric acid 3.1    06/22/17 Cytogenetics Report:   06/22/17 Flow Cytometry:    06/22/17 BM Bx:    RADIOGRAPHIC STUDIES:  I have  personally reviewed the radiological images as listed and agreed with the findings in the report. No results found.   11/13/17 PET/CT    ASSESSMENT & PLAN:    AIKEEM LILLEY is a 64 y.o. 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 No adenopathy. 2. Stable hypodense liver lesions. These were not overtly FDG avid on PET/CT from 10 2019."  04/07/2019 CT C/A/P revealed "1. Slight interval increase in size a central small bowel mesentery lymph node, nonspecific. Otherwise, no evidence of new or enlarging adenopathy involving the chest, abdomen or pelvis."  #2 Thrombocytopenia. Patient has some chronic thrombocytopenia 85-125k for a few years. Platelets upon initial visit are down to 36k in the setting of newly diagnosed lymphoma and treatment with R-CHOEP and from hypersplenism but then improved. PLt counts had normalized after treatment of the lymphoma pre-transplant  PLAN: -Discussed pt labwork today, 01/25/20; blood counts and chemistries are stable, LDH is WNL, Vitamin D is in progress -The pt shows no clinical or lab or radiographic progression/return of his peripheral T-Cell lymphoma at this time.  -No indication for further treatment. Will continue watchful observation.  -Advised pt that there is some concern for incomplete recovery of PLT after transplant, but that his PLT should behave normally.  -Not unreasonable to use prophylactic antibiotics for any dental procedures, especially if there's any concern for infection.  -Advised pt that there are no contraindications to dental procedures from our standpoint.  Pt has had his post-transplant vaccinations.  -Advised pt that optimizing Vitamin D level will help with immune function and bone health -  Goal Vitamin D is between 60-90.   -Continue limiting NSAID use and avoiding acid suppressants from a thrombocytopenia standpoint. -Discussed CDC guidelines regarding the COVID19 booster.  -Recommend pt f/u with transplant team as scheduled -Recommend regular Dermatology f/u - may need referral by PCP -Recommend annual eye exams -Will see back in 6 months with labs    #7 History of Melanoma, 2009 -Followed by Dr. Allyson Sabal for continued skin screening   #8 Patient Active Problem List   Diagnosis Date Noted  . Pulmonary infiltrates 06/24/2017  . Peripheral T cell lymphoma of intrathoracic lymph nodes ([place]) 03/11/2017  . Counseling regarding advanced care planning and goals of care 02/12/2017  . Mature T/NK-cell lymphomas, unspecified, lymph nodes of multiple sites ([place]) 02/10/2017  . Cough variant asthma vs UACS 01/27/2017  . Dyslipidemia 08/05/2016  . Mild asthma 08/05/2016  . DJD (degenerative joint disease) 08/05/2016  . HLD (hyperlipidemia) 07/27/2016  . GERD (gastroesophageal reflux disease) 07/27/2016  . Gout 07/27/2016  . Discomfort in chest 07/27/2016  . OSA (obstructive sleep apnea) 09/19/2013  . Thrombocytopenia ([place]) 04/14/2011  . SCHATZKI'S RING 08/21/2009    FOLLOW UP: RTC with Dr Irene Limbo with labs in 6 months   The total time spent in the appt was 20 minutes and more than 50% was on counseling and direct patient cares.  All of the patient's questions were answered with apparent satisfaction. The patient knows to call the clinic with any problems, questions or concerns.    Sullivan Lone MD [place] AAHIVMS [facility] Hematology/Oncology Physician [facility]  (Office):       [phone] (Work cell):  [phone] (Fax):           [phone]  I, Yevette Edwards, am acting as a scribe for Dr. Sullivan Lone.   .I have  reviewed the above documentation for accuracy and completeness, and I agree with the above. Brunetta Genera MD

## 2020-02-01 ENCOUNTER — Telehealth: Payer: Self-pay | Admitting: Hematology

## 2020-02-01 NOTE — Telephone Encounter (Signed)
Scheduled apt per 9/28 sch msg - mailed reminder letter with appt date and time

## 2020-03-03 ENCOUNTER — Other Ambulatory Visit: Payer: Self-pay | Admitting: Hematology

## 2020-04-23 DIAGNOSIS — J209 Acute bronchitis, unspecified: Secondary | ICD-10-CM | POA: Diagnosis not present

## 2020-05-24 DIAGNOSIS — M1712 Unilateral primary osteoarthritis, left knee: Secondary | ICD-10-CM | POA: Diagnosis not present

## 2020-05-29 DIAGNOSIS — B079 Viral wart, unspecified: Secondary | ICD-10-CM | POA: Diagnosis not present

## 2020-05-29 DIAGNOSIS — L570 Actinic keratosis: Secondary | ICD-10-CM | POA: Diagnosis not present

## 2020-05-29 DIAGNOSIS — L718 Other rosacea: Secondary | ICD-10-CM | POA: Diagnosis not present

## 2020-05-29 DIAGNOSIS — D229 Melanocytic nevi, unspecified: Secondary | ICD-10-CM | POA: Diagnosis not present

## 2020-05-29 DIAGNOSIS — D485 Neoplasm of uncertain behavior of skin: Secondary | ICD-10-CM | POA: Diagnosis not present

## 2020-05-29 DIAGNOSIS — L819 Disorder of pigmentation, unspecified: Secondary | ICD-10-CM | POA: Diagnosis not present

## 2020-05-29 DIAGNOSIS — L814 Other melanin hyperpigmentation: Secondary | ICD-10-CM | POA: Diagnosis not present

## 2020-06-01 ENCOUNTER — Telehealth: Payer: Self-pay

## 2020-06-01 ENCOUNTER — Other Ambulatory Visit: Payer: Self-pay | Admitting: Hematology

## 2020-06-01 NOTE — Telephone Encounter (Signed)
Patient called and stated that he needs a knee replacement and would like to make sure this is okay since he is a stem cell recipient. Per [name] he can discuss with the patient at his next scheduled appt. Explained above information to patient and he requested that he be seen sooner by [name] as his need for surgery is urgent. Patient made aware that a scheduling message has been sent.

## 2020-06-04 NOTE — Progress Notes (Signed)
HEMATOLOGY/ONCOLOGY CLINIC NOTE  Date of Service: 06/04/20   Patient Care Team: Catha Gosselin, MD as PCP - General (Family Medicine) Croitoru, Rachelle Hora, MD as PCP - Cardiology (Cardiology)  CHIEF COMPLAINTS/PURPOSE OF CONSULTATION:   F/u for high gradeT cell NHL  CURRENT THERAPY: post BMT active surveillance  Previous Treatment; R-CHOEP x 6 cycles BEAM -Auto HSCT  HISTORY OF PRESENTING ILLNESS:  See previous note for details  INTERVAL HISTORY:  Joseph Powell is a wonderful 65 y.o. male here for a F/U of high grade T cell NHL after 6 cycles of R-CHOP, s/p transplant. We are joined today by his wife Olegario Messier via phone. The patient's last visit with [place] was on 01/25/2020. The pt reports that he is doing well overall.  The pt reports no new concerns or symptoms. He denies any new medication changes. The pt notes that he experienced a respiratory illness in November 2021 that lasted three weeks. He used Mucinex during this time.  The pt notes that he has bone spurs and Degenerative Arthritis in his knee and recently received a Cortisone steroid shot. While doing yard work, he heard a pop in the back of his knee that led to this shot. The pt notes that he is eligible for a Knee Replacement surgery and wishes to get expertise regarding whether he is okay or not. The pt notes the earliest he could have this surgery is mid-April.  The pt has received his COVID vaccines, but not booster.  Lab results today 06/05/2020 of CBC w/diff and CMP is as follows: all values are WNL except for Plt of 80K, Monocytes Absolute of 2.1K, Abs Immature Granulocytes at 0.14K. 06/05/2020 LDH is normal at 155.  On review of systems, pt denies fevers, chills, night sweats, infection issues, bleeding issues, unexpected weight gain, new lumps/bumps, back pain, leg swelling and any other symptoms.     MEDICAL HISTORY:  Past Medical History:  Diagnosis Date  . Allergic rhinitis   . Asthma    slight  .  Bell's palsy   . GERD (gastroesophageal reflux disease)   . Gout   . Hypercholesteremia   . Melanoma ([facility])   . Osteoarthritis   . Sleep apnea    Resolving  . Thrombocytopenia ([facility])   . Tubular adenoma     SURGICAL HISTORY: Past Surgical History:  Procedure Laterality Date  . IR FLUORO GUIDE PORT INSERTION RIGHT  02/17/2017  . IR REMOVAL TUN ACCESS W/ PORT W/O FL MOD SED  01/05/2018  . IR US GUIDE VASC ACCESS RIGHT  02/17/2017  . KNEE SURGERY  1993  . MELANOMA EXCISION    . NASAL RECONSTRUCTION    . VASECTOMY  1999    SOCIAL HISTORY: Social History   Socioeconomic History  . Marital status: Married    Spouse name: Not on file  . Number of children: Not on file  . Years of education: Not on file  . Highest education level: Not on file  Occupational History  . Occupation: Primary school teacher  Tobacco Use  . Smoking status: Never Smoker  . Smokeless tobacco: Never Used  Vaping Use  . Vaping Use: Never used  Substance and Sexual Activity  . Alcohol use: Yes    Alcohol/week: 2.0 standard drinks    Types: 2 Standard drinks or equivalent per week    Comment: socially  . Drug use: No  . Sexual activity: Not on file  Other Topics Concern  . Not on file  Social History  Narrative  . Not on file   Social Determinants of Health   Financial Resource Strain: Not on file  Food Insecurity: Not on file  Transportation Needs: Not on file  Physical Activity: Not on file  Stress: Not on file  Social Connections: Not on file  Intimate Partner Violence: Not on file    FAMILY HISTORY: Family History  Problem Relation Age of Onset  . Diabetes Mother   . Asthma Mother   . Gout Brother   . Colon cancer Paternal Uncle   . Esophageal cancer Neg Hx   . Stomach cancer Neg Hx     ALLERGIES:  is allergic to penicillamine and penicillins.  MEDICATIONS:  Current Outpatient Medications  Medication Sig Dispense Refill  . albuterol (VENTOLIN HFA) 108 (90 Base) MCG/ACT inhaler Inhale  1-2 puffs into the lungs every 6 (six) hours as needed for wheezing or shortness of breath. 18 g 4  . carvedilol (COREG) 3.125 MG tablet Take 1 tablet (3.125 mg total) by mouth 2 (two) times daily with a meal. 180 tablet 3  . desloratadine (CLARINEX) 5 MG tablet Take 5 mg by mouth daily.    . mometasone-formoterol (DULERA) 100-5 MCG/ACT AERO Inhale 2 puffs into the lungs 2 (two) times daily. 13 g 12  . traZODone (DESYREL) 50 MG tablet TAKE 1 TO 2 TABLETS BY MOUTH AT BEDTIME AS NEEDED 60 tablet 0  . Vitamin D, Ergocalciferol, (DRISDOL) 1.25 MG (50000 UNIT) CAPS capsule Take 1 capsule by mouth once a week 12 capsule 0   Current Facility-Administered Medications  Medication Dose Route Frequency Provider Last Rate Last Admin  . 0.9 %  sodium chloride infusion  500 mL Intravenous Continuous Milus Banister, MD        REVIEW OF SYSTEMS:   10 Point review of Systems was done is negative except as noted above.  PHYSICAL EXAMINATION: ECOG PERFORMANCE STATUS: 1 - Symptomatic but completely ambulatory  Vitals:   06/05/20 0906  BP: (!) 146/83  Pulse: 76  Resp: 20  Temp: (!) 97.1 F (36.2 C)  SpO2: 99%   Filed Weights   06/05/20 0906  Weight: 211 lb 4.8 oz (95.8 kg)   .Body mass index is 29.47 kg/m.    GENERAL:alert, in no acute distress and comfortable SKIN: no acute rashes, no significant lesions EYES: conjunctiva are pink and non-injected, sclera anicteric OROPHARYNX: MMM, no exudates, no oropharyngeal erythema or ulceration NECK: supple, no JVD LYMPH:  no palpable lymphadenopathy in the cervical, axillary or inguinal regions LUNGS: clear to auscultation b/l with normal respiratory effort HEART: regular rate & rhythm ABDOMEN:  normoactive bowel sounds , non tender, not distended. Extremity: no pedal edema PSYCH: alert & oriented x 3 with fluent speech NEURO: no focal motor/sensory deficits   LABORATORY DATA:  I have reviewed the data as listed  . CBC Latest Ref Rng & Units  06/05/2020 01/25/2020 06/24/2019  WBC 4.0 - 10.5 K/uL 7.8 6.9 5.7  Hemoglobin 13.0 - 17.0 g/dL 16.0 14.5 14.5  Hematocrit 39.0 - 52.0 % 47.3 42.0 42.3  Platelets 150 - 400 K/uL 80(L) 97(L) 94(L)  ANC 8.6k . CMP Latest Ref Rng & Units 06/05/2020 01/25/2020 06/24/2019  Glucose 70 - 99 mg/dL 88 137(H) 119(H)  BUN 8 - 23 mg/dL 11 10 10   Creatinine 0.61 - 1.24 mg/dL 1.14 0.92 0.80  Sodium 135 - 145 mmol/L 140 143 142  Potassium 3.5 - 5.1 mmol/L 4.9 3.9 4.1  Chloride 98 - 111 mmol/L 104 109  109  CO2 22 - 32 mmol/L 27 29 24   Calcium 8.9 - 10.3 mg/dL 9.8 9.3 9.0  Total Protein 6.5 - 8.1 g/dL 6.7 6.7 6.7  Total Bilirubin 0.3 - 1.2 mg/dL 0.8 0.6 0.8  Alkaline Phos 38 - 126 U/L 98 107 105  AST 15 - 41 U/L 17 18 13(L)  ALT 0 - 44 U/L 22 18 15    Uric acid 3.1    06/22/17 Cytogenetics Report:   06/22/17 Flow Cytometry:    06/22/17 BM Bx:    RADIOGRAPHIC STUDIES:  I have personally reviewed the radiological images as listed and agreed with the findings in the report. No results found.   11/13/17 PET/CT    ASSESSMENT & PLAN:    PRABHAV FAULKENBERRY is a 65 y.o. 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 No adenopathy. 2. Stable hypodense liver lesions. These were not overtly FDG avid on PET/CT from 10 2019."  04/07/2019 CT C/A/P revealed "1. Slight interval increase in size a central small bowel mesentery lymph node, nonspecific. Otherwise, no evidence of new or enlarging adenopathy involving the chest, abdomen or pelvis."  #2 Thrombocytopenia. Patient has some chronic thrombocytopenia 85-125k for a few  years. Platelets upon initial visit are down to 36k in the setting of newly diagnosed lymphoma and treatment with R-CHOEP and from hypersplenism but then improved. PLt counts had normalized after treatment of the lymphoma pre-transplant  PLAN: -Discussed pt labwork today, 06/05/2020; slight increase in monocytes and Plt still running lower. Chemistries/LDH all normal/stable. Persistent monocytosis. -Advised pt that we may look at bone marrow again if monocytes keep increasing. -Will get Immature Plt Fraction lab today.-- done elevated@ 17.2 -Advised pt that most of immune system has recovered two years post-transplant. Recommended pt have at least 50K Plt count prior to surgery due to high risk of bleeding. The pt will be three years post-transplant in April 2022. -Advised pt that Knee Replacement surgery is personal choice, but needs to evaluate burden of surgery vs injury and burden of hospital occupancy if complications arise. -Recommend pt receive Bm Bx to observe if monocytosis is low grade MDS or not. The conservative approach would be to continue to monitor. The pt wishes to continue to monitor without Bx at this time. -The pt shows no clinical or lab or radiographic progression/return of his peripheral T-Cell lymphoma at this time.  -No indication for further treatment. Will continue watchful observation.  -Discussed CDC guidelines regarding the COVID Booster shot.  -Will see back in 6 months with labs.    #7 History of Melanoma, 2009 -Followed by Dr. Allyson Sabal for continued skin screening   #8 Patient Active Problem List   Diagnosis Date Noted  . Pulmonary infiltrates 06/24/2017  . Peripheral T cell lymphoma of intrathoracic lymph nodes ([place]) 03/11/2017  . Counseling regarding advanced care planning and goals of care 02/12/2017  . Mature T/NK-cell lymphomas, unspecified, lymph nodes of multiple sites ([place]) 02/10/2017  . Cough variant asthma vs UACS 01/27/2017  . Dyslipidemia  08/05/2016  . Mild asthma 08/05/2016  . DJD (degenerative joint disease) 08/05/2016  . HLD (hyperlipidemia) 07/27/2016  . GERD (gastroesophageal  reflux disease) 07/27/2016  . Gout 07/27/2016  . Discomfort in chest 07/27/2016  . OSA (obstructive sleep apnea) 09/19/2013  . Thrombocytopenia ([place]) 04/14/2011  . SCHATZKI'S RING 08/21/2009    FOLLOW UP: RTC with Dr Irene Limbo with labs in 6 months    The total time spent in the appointment was 20 minutes and more than 50% was on counseling and direct patient cares.  All of the patient's questions were answered with apparent satisfaction. The patient knows to call the clinic with any problems, questions or concerns.    Sullivan Lone MD [place] AAHIVMS [facility] Hematology/Oncology Physician [facility]  (Office):       [phone] (Work cell):  [phone] (Fax):           [phone]  I, Reinaldo Raddle, am acting as scribe for Dr. Sullivan Lone, MD.    .I have reviewed the above documentation for accuracy and completeness, and I agree with the above. Brunetta Genera MD

## 2020-06-05 ENCOUNTER — Inpatient Hospital Stay (HOSPITAL_BASED_OUTPATIENT_CLINIC_OR_DEPARTMENT_OTHER): Payer: BC Managed Care – PPO | Admitting: Hematology

## 2020-06-05 ENCOUNTER — Other Ambulatory Visit: Payer: Self-pay

## 2020-06-05 ENCOUNTER — Inpatient Hospital Stay: Payer: BC Managed Care – PPO | Attending: Hematology

## 2020-06-05 VITALS — BP 146/83 | HR 76 | Temp 97.1°F | Resp 20 | Ht 71.0 in | Wt 211.3 lb

## 2020-06-05 DIAGNOSIS — C8442 Peripheral T-cell lymphoma, not classified, intrathoracic lymph nodes: Secondary | ICD-10-CM

## 2020-06-05 DIAGNOSIS — Z85820 Personal history of malignant melanoma of skin: Secondary | ICD-10-CM | POA: Diagnosis not present

## 2020-06-05 DIAGNOSIS — C8498 Mature T/NK-cell lymphomas, unspecified, lymph nodes of multiple sites: Secondary | ICD-10-CM | POA: Diagnosis not present

## 2020-06-05 DIAGNOSIS — Z79899 Other long term (current) drug therapy: Secondary | ICD-10-CM | POA: Insufficient documentation

## 2020-06-05 DIAGNOSIS — D696 Thrombocytopenia, unspecified: Secondary | ICD-10-CM | POA: Diagnosis not present

## 2020-06-05 LAB — CBC WITH DIFFERENTIAL/PLATELET
Abs Immature Granulocytes: 0.14 10*3/uL — ABNORMAL HIGH (ref 0.00–0.07)
Basophils Absolute: 0 10*3/uL (ref 0.0–0.1)
Basophils Relative: 0 %
Eosinophils Absolute: 0 10*3/uL (ref 0.0–0.5)
Eosinophils Relative: 0 %
HCT: 47.3 % (ref 39.0–52.0)
Hemoglobin: 16 g/dL (ref 13.0–17.0)
Immature Granulocytes: 2 %
Lymphocytes Relative: 16 %
Lymphs Abs: 1.3 10*3/uL (ref 0.7–4.0)
MCH: 31.4 pg (ref 26.0–34.0)
MCHC: 33.8 g/dL (ref 30.0–36.0)
MCV: 92.7 fL (ref 80.0–100.0)
Monocytes Absolute: 2.1 10*3/uL — ABNORMAL HIGH (ref 0.1–1.0)
Monocytes Relative: 27 %
Neutro Abs: 4.2 10*3/uL (ref 1.7–7.7)
Neutrophils Relative %: 55 %
Platelets: 80 10*3/uL — ABNORMAL LOW (ref 150–400)
RBC: 5.1 MIL/uL (ref 4.22–5.81)
RDW: 12.5 % (ref 11.5–15.5)
WBC: 7.8 10*3/uL (ref 4.0–10.5)
nRBC: 0 % (ref 0.0–0.2)

## 2020-06-05 LAB — CMP (CANCER CENTER ONLY)
ALT: 22 U/L (ref 0–44)
AST: 17 U/L (ref 15–41)
Albumin: 4.5 g/dL (ref 3.5–5.0)
Alkaline Phosphatase: 98 U/L (ref 38–126)
Anion gap: 9 (ref 5–15)
BUN: 11 mg/dL (ref 8–23)
CO2: 27 mmol/L (ref 22–32)
Calcium: 9.8 mg/dL (ref 8.9–10.3)
Chloride: 104 mmol/L (ref 98–111)
Creatinine: 1.14 mg/dL (ref 0.61–1.24)
GFR, Estimated: >60 mL/min
Glucose, Bld: 88 mg/dL (ref 70–99)
Potassium: 4.9 mmol/L (ref 3.5–5.1)
Sodium: 140 mmol/L (ref 135–145)
Total Bilirubin: 0.8 mg/dL (ref 0.3–1.2)
Total Protein: 6.7 g/dL (ref 6.5–8.1)

## 2020-06-05 LAB — LACTATE DEHYDROGENASE: LDH: 155 U/L (ref 98–192)

## 2020-06-05 LAB — IMMATURE PLATELET FRACTION: Immature Platelet Fraction: 17.2 % — ABNORMAL HIGH (ref 1.2–8.6)

## 2020-06-21 DIAGNOSIS — M1712 Unilateral primary osteoarthritis, left knee: Secondary | ICD-10-CM | POA: Diagnosis not present

## 2020-06-27 ENCOUNTER — Telehealth: Payer: Self-pay | Admitting: *Deleted

## 2020-06-27 NOTE — Telephone Encounter (Signed)
Left message for the pt to call the office to schedule a pre op appt with Dr. Sallyanne Kuster or APP.

## 2020-06-27 NOTE — Telephone Encounter (Signed)
   [facility] Pre-operative Risk Assessment    Request for surgical clearance:  1. What type of surgery is being performed? LEFT KNEE ARTHROPLASTY    2. When is this surgery scheduled? TBD   3. What type of clearance is required (medical clearance vs. Pharmacy clearance to hold med vs. Both)? CLEARANCE   4. Are there any medications that need to be held prior to surgery and how long?   5. Practice name and name of physician performing surgery?  [facility]   6. What is the office phone number? [phone]   7.   What is the office fax number? [place]   8.   Anesthesia type (None, local, MAC, general) ? CHOICE

## 2020-06-27 NOTE — Telephone Encounter (Signed)
   Primary Cardiologist: Sanda Klein, MD  Chart reviewed as part of pre-operative protocol coverage. Because of Joseph Powell's past medical history and time since last visit, he will require a follow-up visit in order to better assess preoperative cardiovascular risk.  Pre-op covering staff: - Please schedule appointment with Dr Aundra Dubin or an APP and call patient to inform them. If patient already had an upcoming appointment within acceptable timeframe, please add "pre-op clearance" to the appointment notes so provider is aware. - Please contact requesting surgeon's office via preferred method (i.e, phone, fax) to inform them of need for appointment prior to surgery.  If applicable, this message will also be routed to pharmacy pool and/or primary cardiologist for input on holding anticoagulant/antiplatelet agent as requested below so that this information is available to the clearing provider at time of patient's appointment.   Kerin Ransom, PA-C  06/27/2020, 11:41 AM

## 2020-06-27 NOTE — Telephone Encounter (Signed)
Pt has been scheduled to see Almyra Deforest, PA 3/18 for pre op clearance. I will send notes to PA for upcoming appt. Will send FYI to requesting office pt has appt 3/18.

## 2020-06-29 DIAGNOSIS — M1712 Unilateral primary osteoarthritis, left knee: Secondary | ICD-10-CM | POA: Diagnosis not present

## 2020-07-18 DIAGNOSIS — Z0189 Encounter for other specified special examinations: Secondary | ICD-10-CM | POA: Diagnosis not present

## 2020-07-19 DIAGNOSIS — I517 Cardiomegaly: Secondary | ICD-10-CM | POA: Diagnosis not present

## 2020-07-19 DIAGNOSIS — Z01818 Encounter for other preprocedural examination: Secondary | ICD-10-CM | POA: Diagnosis not present

## 2020-07-19 DIAGNOSIS — J4520 Mild intermittent asthma, uncomplicated: Secondary | ICD-10-CM | POA: Diagnosis not present

## 2020-07-19 DIAGNOSIS — I77811 Abdominal aortic ectasia: Secondary | ICD-10-CM | POA: Diagnosis not present

## 2020-07-20 ENCOUNTER — Encounter: Payer: Self-pay | Admitting: Physician Assistant

## 2020-07-20 ENCOUNTER — Ambulatory Visit (INDEPENDENT_AMBULATORY_CARE_PROVIDER_SITE_OTHER): Payer: BC Managed Care – PPO | Admitting: Physician Assistant

## 2020-07-20 ENCOUNTER — Other Ambulatory Visit: Payer: Self-pay

## 2020-07-20 VITALS — BP 130/84 | HR 78 | Ht 71.0 in | Wt 213.8 lb

## 2020-07-20 DIAGNOSIS — T451X5D Adverse effect of antineoplastic and immunosuppressive drugs, subsequent encounter: Secondary | ICD-10-CM

## 2020-07-20 DIAGNOSIS — T451X5A Adverse effect of antineoplastic and immunosuppressive drugs, initial encounter: Secondary | ICD-10-CM

## 2020-07-20 DIAGNOSIS — Z01818 Encounter for other preprocedural examination: Secondary | ICD-10-CM | POA: Diagnosis not present

## 2020-07-20 DIAGNOSIS — I427 Cardiomyopathy due to drug and external agent: Secondary | ICD-10-CM | POA: Diagnosis not present

## 2020-07-20 NOTE — Progress Notes (Signed)
Cardiology Office Note:    Date:  07/22/2020   ID:  Joseph Powell, DOB [birth year], MRN [id number]  PCP:  Hulan Fess, [place]  Cardiologist:  Sanda Klein, MD  Advanced Practice Provider:  No care team member to display Electrophysiologist:  None   Referring MD: Hulan Fess, MD   Chief Complaint  Patient presents with  . Follow-up    Seen for Dr. Sallyanne Kuster    History of Present Illness:    Joseph Powell is a 65 y.o. male with a hx of stage IV high-grade peripheral T-cell lymphoma s/p BMT in March 2019 and R-CHOEP chemotherapy, Bell's palsy, hyperlipidemia, obstructive sleep apnea, asthma and history of thrombocytopenia. Myoview obtained on 07/29/2016 showed EF 64%, small defect of moderate severity present in the basal inferoseptal location consistent with attenuation artifact, overall low risk study.  Patient was initially referred to cardiology service as his PET scan in December 2018 showed small pericardial effusion.  Previous echocardiogram did show abnormal GLS, however normalized on the last echocardiogram in 2020.  Both the patient and his wife tested positive for COVID-19 in October 2020 however had only mild course with loss of taste and a mild congestion.  He was last seen by Fabian Sharp PA-C on 06/30/2019 at which time she was doing well.  At the recommendation of Dr. Aundra Dubin, echocardiogram was repeated on 11/29/2019 which shows EF 50 to 55%, mild LVH, grade 1 DD, no regional wall motion abnormality.  Carvedilol was subsequently stopped.   Patient presents today for preoperative clearance prior to left knee arthroplasty by Dr. Coralee North.  Talking with the patient, he denies any recent chest pain, shortness of breath, lower extremity edema, orthopnea or PND.  Although he initially stopped carvedilol, however he quickly developed a persistent headache off of carvedilol.  Since then, he has opted to restart carvedilol at minimal dosage.   EKG today is unchanged when compared to the previous EKG.  He is able to accomplish at least 4 METS of activity without any issue despite knee problem.  He is cleared from cardiac perspective to proceed with orthopedic procedure.  He can see Dr. Sallyanne Kuster in 1 year.   Past Medical History:  Diagnosis Date  . Allergic rhinitis   . Asthma    slight  . Bell's palsy   . GERD (gastroesophageal reflux disease)   . Gout   . Hypercholesteremia   . Melanoma ([place])   . Osteoarthritis   . Sleep apnea    Resolving  . Thrombocytopenia ([place])   . Tubular adenoma     Past Surgical History:  Procedure Laterality Date  . IR FLUORO GUIDE PORT INSERTION RIGHT  02/17/2017  . IR REMOVAL TUN ACCESS W/ PORT W/O FL MOD SED  01/05/2018  . IR US GUIDE VASC ACCESS RIGHT  02/17/2017  . KNEE SURGERY  1993  . MELANOMA EXCISION    . NASAL RECONSTRUCTION    . VASECTOMY  1999    Current Medications: Current Meds  Medication Sig  . carvedilol (COREG) 3.125 MG tablet Take 1 tablet (3.125 mg total) by mouth 2 (two) times daily with a meal.  . desloratadine (CLARINEX) 5 MG tablet Take 5 mg by mouth daily.  . mometasone-formoterol (DULERA) 100-5 MCG/ACT AERO Inhale 2 puffs into the lungs 2 (two) times daily.  . Vitamin D, Ergocalciferol, (DRISDOL) 1.25 MG (50000 UNIT) CAPS capsule Take 1 capsule by mouth once a week   Current Facility-Administered Medications  for the 07/20/20 encounter (Office Visit) with Almyra Deforest, PA  Medication  . 0.9 %  sodium chloride infusion     Allergies:   Penicillamine and Penicillins   Social History   Socioeconomic History  . Marital status: Married    Spouse name: Not on file  . Number of children: Not on file  . Years of education: Not on file  . Highest education level: Not on file  Occupational History  . Occupation: Risk analyst  Tobacco Use  . Smoking status: Never Smoker  . Smokeless tobacco: Never Used  Vaping Use  . Vaping Use: Never used  Substance and Sexual  Activity  . Alcohol use: Yes    Alcohol/week: 2.0 standard drinks    Types: 2 Standard drinks or equivalent per week    Comment: socially  . Drug use: No  . Sexual activity: Not on file  Other Topics Concern  . Not on file  Social History Narrative  . Not on file   Social Determinants of Health   Financial Resource Strain: Not on file  Food Insecurity: Not on file  Transportation Needs: Not on file  Physical Activity: Not on file  Stress: Not on file  Social Connections: Not on file     Family History: The patient's family history includes Asthma in his mother; Colon cancer in his paternal uncle; Diabetes in his mother; Gout in his brother. There is no history of Esophageal cancer or Stomach cancer.  ROS:   Please see the history of present illness.     All other systems reviewed and are negative.  EKGs/Labs/Other Studies Reviewed:    The following studies were reviewed today:  Echo 11/29/2019 1. Low normal LV systolic function; mild LVH; grade 1 diastolic  dysfunction; GLS -16.2%.  2. Left ventricular ejection fraction, by estimation, is 50 to 55%. The  left ventricle has low normal function. The left ventricle has no regional  wall motion abnormalities. There is mild left ventricular hypertrophy.  Left ventricular diastolic  parameters are consistent with Grade I diastolic dysfunction (impaired  relaxation).  3. Right ventricular systolic function is normal. The right ventricular  size is normal.  4. The mitral valve is normal in structure. Trivial mitral valve  regurgitation. No evidence of mitral stenosis.  5. The aortic valve is tricuspid. Aortic valve regurgitation is not  visualized. No aortic stenosis is present.  6. The inferior vena cava is normal in size with greater than 50%  respiratory variability, suggesting right atrial pressure of 3 mmHg.   EKG:  EKG is ordered today.  The ekg ordered today demonstrates sinus rhythm, poor R wave question in the  anterior leads.  Recent Labs: 06/05/2020: ALT 22; BUN 11; Creatinine 1.14; Hemoglobin 16.0; Platelets 80; Potassium 4.9; Sodium 140  Recent Lipid Panel    Component Value Date/Time   CHOL 111 07/28/2016 0507   TRIG 288 (H) 07/28/2016 0507   HDL 11 (L) 07/28/2016 0507   CHOLHDL 10.1 07/28/2016 0507   VLDL 58 (H) 07/28/2016 0507   LDLCALC 42 07/28/2016 0507     Risk Assessment/Calculations:       Physical Exam:    VS:  BP 130/84   Pulse 78   Ht 5\' 11"  (1.803 m)   Wt 213 lb 12.8 oz (97 kg)   SpO2 95%   BMI 29.82 kg/m     Wt Readings from Last 3 Encounters:  07/20/20 213 lb 12.8 oz (97 kg)  06/05/20 211 lb 4.8  oz (95.8 kg)  01/25/20 207 lb 9.6 oz (94.2 kg)     GEN:  Well nourished, well developed in no acute distress HEENT: Normal NECK: No JVD; No carotid bruits LYMPHATICS: No lymphadenopathy CARDIAC: RRR, no murmurs, rubs, gallops RESPIRATORY:  Clear to auscultation without rales, wheezing or rhonchi  ABDOMEN: Soft, non-tender, non-distended MUSCULOSKELETAL:  No edema; No deformity  SKIN: Warm and dry NEUROLOGIC:  Alert and oriented x 3 PSYCHIATRIC:  Normal affect   ASSESSMENT:    1. Pre-op evaluation    PLAN:    In order of problems listed above:  1. Preoperative clearance: Pending left knee arthroscopy by Dr. Ricki Rodriguez.  He is able to accomplish more than 4 METS of activity without any issue.  Previous chemotherapy induced cardiomyopathy has completely resolved based on the last echocardiogram.  He is cleared to proceed with knee surgery without further work-up  2. Chemotherapy-induced cardiomyopathy: Resolved on echocardiogram.  Stable ejection fraction on the last echo.  Although he was previously cleared to come off of carvedilol, however when he did come off carvedilol, he developed some headache, therefore he has since restarted on the carvedilol 3.125 mg twice daily.        Medication Adjustments/Labs and Tests Ordered: Current medicines are reviewed  at length with the patient today.  Concerns regarding medicines are outlined above.  Orders Placed This Encounter  Procedures  . EKG 12-Lead   No orders of the defined types were placed in this encounter.   Patient Instructions  Medication Instructions:  Your physician recommends that you continue on your current medications as directed. Please refer to the Current Medication list given to you today.  *If you need a refill on your cardiac medications before your next appointment, please call your pharmacy*  Lab Work: NONE ordered at this time of appointment   If you have labs (blood work) drawn today and your tests are completely normal, you will receive your results only by: Marland Kitchen MyChart Message (if you have MyChart) OR . A paper copy in the mail If you have any lab test that is abnormal or we need to change your treatment, we will call you to review the results.  Testing/Procedures: NONE ordered at this time of appointment   Follow-Up: At [facility], you and your health needs are our priority.  As part of our continuing mission to provide you with exceptional heart care, we have created designated Provider Care Teams.  These Care Teams include your primary Cardiologist (physician) and Advanced Practice Providers (APPs -  Physician Assistants and Nurse Practitioners) who all work together to provide you with the care you need, when you need it.  Your next appointment:   1 year(s)  The format for your next appointment:   In Person  Provider:   Sanda Klein, MD  Other Instructions      Signed, Almyra Deforest, [place]  07/22/2020 10:46 PM    [place]

## 2020-07-20 NOTE — Patient Instructions (Signed)
Medication Instructions:  °Your physician recommends that you continue on your current medications as directed. Please refer to the Current Medication list given to you today. ° °*If you need a refill on your cardiac medications before your next appointment, please call your pharmacy* ° °Lab Work: °NONE ordered at this time of appointment  ° °If you have labs (blood work) drawn today and your tests are completely normal, you will receive your results only by: °MyChart Message (if you have MyChart) OR °A paper copy in the mail °If you have any lab test that is abnormal or we need to change your treatment, we will call you to review the results. ° °Testing/Procedures: °NONE ordered at this time of appointment  ° °Follow-Up: °At [facility], you and your health needs are our priority.  As part of our continuing mission to provide you with exceptional heart care, we have created designated Provider Care Teams.  These Care Teams include your primary Cardiologist (physician) and Advanced Practice Providers (APPs -  Physician Assistants and Nurse Practitioners) who all work together to provide you with the care you need, when you need it. ° °Your next appointment:   °1 year(s) ° °The format for your next appointment:   °In Person ° °Provider:   °Mihai Croitoru, MD { ° °Other Instructions ° ° °

## 2020-07-22 ENCOUNTER — Encounter: Payer: Self-pay | Admitting: Physician Assistant

## 2020-07-30 ENCOUNTER — Ambulatory Visit: Payer: BC Managed Care – PPO | Admitting: Hematology

## 2020-07-30 ENCOUNTER — Other Ambulatory Visit: Payer: BC Managed Care – PPO

## 2020-08-10 DIAGNOSIS — Z08 Encounter for follow-up examination after completed treatment for malignant neoplasm: Secondary | ICD-10-CM | POA: Diagnosis not present

## 2020-08-10 DIAGNOSIS — Z8572 Personal history of non-Hodgkin lymphomas: Secondary | ICD-10-CM | POA: Diagnosis not present

## 2020-08-10 DIAGNOSIS — Z9484 Stem cells transplant status: Secondary | ICD-10-CM | POA: Diagnosis not present

## 2020-08-15 DIAGNOSIS — M1712 Unilateral primary osteoarthritis, left knee: Secondary | ICD-10-CM | POA: Diagnosis not present

## 2020-08-31 ENCOUNTER — Other Ambulatory Visit: Payer: Self-pay | Admitting: Hematology

## 2020-09-11 DIAGNOSIS — M1712 Unilateral primary osteoarthritis, left knee: Secondary | ICD-10-CM | POA: Diagnosis not present

## 2020-09-13 DIAGNOSIS — M1712 Unilateral primary osteoarthritis, left knee: Secondary | ICD-10-CM | POA: Diagnosis not present

## 2020-09-18 DIAGNOSIS — M1712 Unilateral primary osteoarthritis, left knee: Secondary | ICD-10-CM | POA: Diagnosis not present

## 2020-09-20 DIAGNOSIS — M1712 Unilateral primary osteoarthritis, left knee: Secondary | ICD-10-CM | POA: Diagnosis not present

## 2020-09-25 DIAGNOSIS — M1712 Unilateral primary osteoarthritis, left knee: Secondary | ICD-10-CM | POA: Diagnosis not present

## 2020-09-27 DIAGNOSIS — M1712 Unilateral primary osteoarthritis, left knee: Secondary | ICD-10-CM | POA: Diagnosis not present

## 2020-10-04 DIAGNOSIS — M1712 Unilateral primary osteoarthritis, left knee: Secondary | ICD-10-CM | POA: Diagnosis not present

## 2020-10-18 DIAGNOSIS — J309 Allergic rhinitis, unspecified: Secondary | ICD-10-CM | POA: Diagnosis not present

## 2020-11-06 ENCOUNTER — Other Ambulatory Visit: Payer: Self-pay | Admitting: Cardiology

## 2020-11-08 NOTE — Telephone Encounter (Signed)
This is Dr. Hochrein's pt. °

## 2020-11-28 ENCOUNTER — Telehealth: Payer: Self-pay

## 2020-11-28 NOTE — Telephone Encounter (Signed)
Ok for [facility] in October?

## 2020-12-04 ENCOUNTER — Inpatient Hospital Stay: Payer: BC Managed Care – PPO | Admitting: Hematology

## 2020-12-04 ENCOUNTER — Inpatient Hospital Stay: Payer: BC Managed Care – PPO

## 2020-12-17 ENCOUNTER — Telehealth: Payer: Self-pay | Admitting: Hematology

## 2020-12-17 NOTE — Telephone Encounter (Signed)
Rescheduled upcoming appointment due to provider's emergency. Patient is aware of changes. 

## 2020-12-19 ENCOUNTER — Ambulatory Visit: Payer: BC Managed Care – PPO | Admitting: Hematology

## 2020-12-19 ENCOUNTER — Other Ambulatory Visit: Payer: BC Managed Care – PPO

## 2020-12-25 ENCOUNTER — Ambulatory Visit: Payer: BC Managed Care – PPO | Admitting: Internal Medicine

## 2020-12-25 NOTE — Progress Notes (Deleted)
Subjective:     Patient ID: Joseph Powell, male   DOB: [birth year]     MRN: [id number]    Brief patient profile:  64 yowm never smoker never problems with allergies/ asthma onset of pnds seasonally in 40s better on clarinex but eventually started needed year round but even then  gradually worse sense of pnds then daily cough x 2015 referred to pulmonary clinic 01/27/2017 by Dr   Hulan Fess with recently detected widespread adenopathy c/w T cell lymphoma.     History of Present Illness  01/27/2017 1st [facility] office visit/ Arianni Gallego   Chief Complaint  Patient presents with   Pulmonary Consult    Referred by Dr. Hulan Fess. Pt c/o cough off and on for the past 3 years. He is not producing any sputum at this point- just finished pred taper and round of abx. He states that when he tries to take in a deep breath it triggers him to cough.  He has an albuterol inhaler that he typically uses 3 x per month.   chronic pnds x decades then worse cough esp spring and fall much better with prednisone which tapered off one day prior to OV  But s excess/ purulent sputum or mucus plugs   ppi seemed to help some with neg EGD 12/05/16 and continuing to take ppi but not always 30 m ac  Not limited by breathing from desired activities but senses can't take a deep breath s provoking cough which is worse with ex   ? Some better with saba but rarely uses  rec Continue protonix 40 mg Take 30-60 min before first meal of the day  GERD   Dulera 100 Take 2 puffs first thing in am and then another 2 puffs about 12 hours later.  Work on inhaler technique:   If still can't stop coughing > tessalon 200 mg  Up to three times daily as needed  For drainage / throat tickle try instead of clarinex take CHLORPHENIRAMINE  4 mg - take one every 4 hours as needed - available over the counter- may cause drowsiness so start with just a bedtime dose or two and see how you tolerate it before trying in daytime    03/10/2017   f/u ov/Kallin Henk re:  Cough variant asthma improved on dulera 100 2bid  Chief Complaint  Patient presents with   Follow-up    Cough is "99% better". Still clearing his throat often.    no problem with noct cough  Exercise > sob = MMRC1 = can walk nl pace, flat grade, can't hurry or go uphills or steps s sob   No additional changes in resp symptoms  when on pred for chemo for lymphoma x one week cycles  rec Plan A = Automatic = dulera 100 Take 2 puffs first thing in am and then another 2 puffs about 12 hours later.  Plan B = Backup Only use your albuterol as a rescue medication Try to suppress cough with either hard rock candy or Tessalon to prevent throat clearing  For drainage / throat tickle try take CHLORPHENIRAMINE  4 mg - take one every 4 hours as needed    Last ov with oncology 06/15/17 "CURRENT THERAPY: R-CHOEP s/p 6 cycles"  Last rx 06/03/17     06/23/2017   Acute office consultation/Arland Usery re: worse cough and PET abnormalities  Chief Complaint  Patient presents with   Pulmonary Consult    Referred back by [name] for eval of  abnormal PET scan. Pt states his cough has been worse over the past wk- prod with clear sputum.  His breathing is unchangd since last visit. He is using his albuterol inhaler 4-5 x per wk on average.   Dyspnea = baseline = MMRC1 = can walk nl pace, flat grade, can't hurry or go uphills or steps s sob   Cough: worse after supper esp x one week but only minimal mucoid sputum   Sleep: ok flat no 02  Looking at stem cell transplant at [facility] Started on levaquin  06/22/17 no change yet  Rec Finish levaquin and then rec  repeat CT sinus and Chest to see what if any further treatment is needed or if lung bx is required prior to stem cell transplant   symbicort 80 = dulera 100  Take 2 puffs first thing in am and then another 2 puffs about 12 hours later.   Work on inhaler technique:  relax and gently blow all the way out then take a nice smooth deep breath back in,  triggering the inhaler at same time you start breathing in.  Hold for up to 5 seconds if you can. Blow out thru nose. Rinse and gargle with water when done      For cough > mucinex dm 1200 mg every 12 hours as needed  Make sure protonix is Take 30- 60 min before your first and last meals of the day   GERD   12/25/2020  f/u ov/Alline Pio re: cough variant asthma/ mpns   maint on ***  No chief complaint on file.   Dyspnea:  *** Cough: *** Sleeping: *** SABA use: *** 02: *** Covid status:   ***   No obvious day to day or daytime variability or assoc excess/ purulent sputum or mucus plugs or hemoptysis or cp or chest tightness, subjective wheeze or overt sinus or hb symptoms.   *** without nocturnal  or early am exacerbation  of respiratory  c/o's or need for noct saba. Also denies any obvious fluctuation of symptoms with weather or environmental changes or other aggravating or alleviating factors except as outlined above   No unusual exposure hx or h/o childhood pna/ asthma or knowledge of premature birth.  Current Allergies, Complete Past Medical History, Past Surgical History, Family History, and Social History were reviewed in [employer] record.  ROS  The following are not active complaints unless bolded Hoarseness, sore throat, dysphagia, dental problems, itching, sneezing,  nasal congestion or discharge of excess mucus or purulent secretions, ear ache,   fever, chills, sweats, unintended wt loss or wt gain, classically pleuritic or exertional cp,  orthopnea pnd or arm/hand swelling  or leg swelling, presyncope, palpitations, abdominal pain, anorexia, nausea, vomiting, diarrhea  or change in bowel habits or change in bladder habits, change in stools or change in urine, dysuria, hematuria,  rash, arthralgias, visual complaints, headache, numbness, weakness or ataxia or problems with walking or coordination,  change in mood or  memory.        No outpatient  medications have been marked as taking for the 12/25/20 encounter (Appointment) with Tanda Rockers, MD.   Current Facility-Administered Medications for the 12/25/20 encounter (Appointment) with Tanda Rockers, MD  Medication   0.9 %  sodium chloride infusion                         Objective:   Physical Exam     12/25/2020        ***  06/23/2017       166    01/27/17 184 lb (83.5 kg)  01/26/17 183 lb 11.2 oz (83.3 kg)  01/20/17 182 lb (82.6 kg)     Vital signs reviewed  12/25/2020  - Note at rest 02 sats  ***% on ***   General appearance:    ***      Assessment:

## 2020-12-28 ENCOUNTER — Inpatient Hospital Stay: Payer: BC Managed Care – PPO | Attending: Hematology

## 2020-12-28 ENCOUNTER — Other Ambulatory Visit: Payer: Self-pay

## 2020-12-28 ENCOUNTER — Inpatient Hospital Stay (HOSPITAL_BASED_OUTPATIENT_CLINIC_OR_DEPARTMENT_OTHER): Payer: BC Managed Care – PPO | Admitting: Hematology

## 2020-12-28 VITALS — BP 134/96 | HR 93 | Temp 98.5°F | Resp 20 | Wt 204.4 lb

## 2020-12-28 DIAGNOSIS — Z85820 Personal history of malignant melanoma of skin: Secondary | ICD-10-CM | POA: Diagnosis not present

## 2020-12-28 DIAGNOSIS — D696 Thrombocytopenia, unspecified: Secondary | ICD-10-CM

## 2020-12-28 DIAGNOSIS — C8498 Mature T/NK-cell lymphomas, unspecified, lymph nodes of multiple sites: Secondary | ICD-10-CM | POA: Diagnosis not present

## 2020-12-28 DIAGNOSIS — Z79899 Other long term (current) drug therapy: Secondary | ICD-10-CM | POA: Insufficient documentation

## 2020-12-28 DIAGNOSIS — C8442 Peripheral T-cell lymphoma, not classified, intrathoracic lymph nodes: Secondary | ICD-10-CM

## 2020-12-28 LAB — CBC WITH DIFFERENTIAL/PLATELET
Abs Immature Granulocytes: 0.14 10*3/uL — ABNORMAL HIGH (ref 0.00–0.07)
Basophils Absolute: 0 10*3/uL (ref 0.0–0.1)
Basophils Relative: 0 %
Eosinophils Absolute: 0 10*3/uL (ref 0.0–0.5)
Eosinophils Relative: 0 %
HCT: 46 % (ref 39.0–52.0)
Hemoglobin: 15.4 g/dL (ref 13.0–17.0)
Immature Granulocytes: 2 %
Lymphocytes Relative: 20 %
Lymphs Abs: 1.3 10*3/uL (ref 0.7–4.0)
MCH: 30 pg (ref 26.0–34.0)
MCHC: 33.5 g/dL (ref 30.0–36.0)
MCV: 89.5 fL (ref 80.0–100.0)
Monocytes Absolute: 1.7 10*3/uL — ABNORMAL HIGH (ref 0.1–1.0)
Monocytes Relative: 26 %
Neutro Abs: 3.5 10*3/uL (ref 1.7–7.7)
Neutrophils Relative %: 52 %
Platelets: 106 10*3/uL — ABNORMAL LOW (ref 150–400)
RBC: 5.14 MIL/uL (ref 4.22–5.81)
RDW: 15.2 % (ref 11.5–15.5)
WBC: 6.7 10*3/uL (ref 4.0–10.5)
nRBC: 0 % (ref 0.0–0.2)

## 2020-12-28 LAB — CMP (CANCER CENTER ONLY)
ALT: 15 U/L (ref 0–44)
AST: 24 U/L (ref 15–41)
Albumin: 4.7 g/dL (ref 3.5–5.0)
Alkaline Phosphatase: 110 U/L (ref 38–126)
Anion gap: 9 (ref 5–15)
BUN: 11 mg/dL (ref 8–23)
CO2: 28 mmol/L (ref 22–32)
Calcium: 9.6 mg/dL (ref 8.9–10.3)
Chloride: 103 mmol/L (ref 98–111)
Creatinine: 1.05 mg/dL (ref 0.61–1.24)
GFR, Estimated: >60 mL/min (ref >60)
Glucose, Bld: 161 mg/dL — ABNORMAL HIGH (ref 70–99)
Potassium: 5.1 mmol/L (ref 3.5–5.1)
Sodium: 140 mmol/L (ref 135–145)
Total Bilirubin: 0.8 mg/dL (ref 0.3–1.2)
Total Protein: 7 g/dL (ref 6.5–8.1)

## 2020-12-28 LAB — LACTATE DEHYDROGENASE: LDH: 179 U/L (ref 98–192)

## 2020-12-28 MED ORDER — VITAMIN D (ERGOCALCIFEROL) 1.25 MG (50000 UNIT) PO CAPS: 50000.0000 [IU] | ORAL_CAPSULE | ORAL | 0 refills | Status: DC

## 2021-01-03 NOTE — Progress Notes (Signed)
HEMATOLOGY/ONCOLOGY CLINIC NOTE  Date of Service: 01/03/21   Patient Care Team: Hulan Fess, MD as PCP - General (Family Medicine) Croitoru, Dani Gobble, MD as PCP - Cardiology (Cardiology)  CHIEF COMPLAINTS/PURPOSE OF CONSULTATION:   F/u for high gradeT cell NHL  CURRENT THERAPY: post BMT active surveillance  Previous Treatment; R-CHOEP x 6 cycles BEAM -Auto HSCT  HISTORY OF PRESENTING ILLNESS:  See previous note for details  INTERVAL HISTORY:  Joseph Powell is a wonderful 65 y.o. male here for a F/U of high grade T cell NHL  The patient's last visit with [place] was on 06/05/2020. The pt reports that he is doing well overall.  Patient notes no acute new symptoms since his last visit.  No fevers no chills no night sweats no unexpected sudden weight loss no new lumps or bumps.  No shortness of breath.  No abdominal pain or distention. No change in bowel or bladder habits..  Lab results today CBC is within normal limits except mild thrombocytopenia with platelets of 106k up from 80k. CMP within normal limits other than a glucose level of 161. LDH within normal limits at 179  On review of systems, pt denies any other acute new symptoms.     MEDICAL HISTORY:  Past Medical History:  Diagnosis Date   Allergic rhinitis    Asthma    slight   Bell's palsy    GERD (gastroesophageal reflux disease)    Gout    Hypercholesteremia    Melanoma (HCC)    Osteoarthritis    Sleep apnea    Resolving   Thrombocytopenia (HCC)    Tubular adenoma     SURGICAL HISTORY: Past Surgical History:  Procedure Laterality Date   IR FLUORO GUIDE PORT INSERTION RIGHT  02/17/2017   IR REMOVAL TUN ACCESS W/ PORT W/O FL MOD SED  01/05/2018   IR US GUIDE VASC ACCESS RIGHT  02/17/2017   KNEE SURGERY  1993   MELANOMA EXCISION     NASAL RECONSTRUCTION     VASECTOMY  1999    SOCIAL HISTORY: Social History   Socioeconomic History   Marital status: Married    Spouse name: Not on file    Number of children: Not on file   Years of education: Not on file   Highest education level: Not on file  Occupational History   Occupation: Risk analyst  Tobacco Use   Smoking status: Never   Smokeless tobacco: Never  Vaping Use   Vaping Use: Never used  Substance and Sexual Activity   Alcohol use: Yes    Alcohol/week: 2.0 standard drinks    Types: 2 Standard drinks or equivalent per week    Comment: socially   Drug use: No   Sexual activity: Not on file  Other Topics Concern   Not on file  Social History Narrative   Not on file   Social Determinants of Health   Financial Resource Strain: Not on file  Food Insecurity: Not on file  Transportation Needs: Not on file  Physical Activity: Not on file  Stress: Not on file  Social Connections: Not on file  Intimate Partner Violence: Not on file    FAMILY HISTORY: Family History  Problem Relation Age of Onset   Diabetes Mother    Asthma Mother    Gout Brother    Colon cancer Paternal Uncle    Esophageal cancer Neg Hx    Stomach cancer Neg Hx     ALLERGIES:  is allergic to  penicillamine and penicillins.  MEDICATIONS:  Current Outpatient Medications  Medication Sig Dispense Refill   carvedilol (COREG) 3.125 MG tablet TAKE 1 TABLET BY MOUTH TWICE DAILY WITH A MEAL 180 tablet 3   mometasone-formoterol (DULERA) 100-5 MCG/ACT AERO Inhale 2 puffs into the lungs 2 (two) times daily. 13 g 12   desloratadine (CLARINEX) 5 MG tablet Take 5 mg by mouth daily. (Patient not taking: No sig reported)     loratadine (CLARITIN) 10 MG tablet Take 10 mg by mouth daily.     Vitamin D, Ergocalciferol, (DRISDOL) 1.25 MG (50000 UNIT) CAPS capsule Take 1 capsule (50,000 Units total) by mouth once a week. 12 capsule 0   Current Facility-Administered Medications  Medication Dose Route Frequency Provider Last Rate Last Admin   0.9 %  sodium chloride infusion  500 mL Intravenous Continuous Milus Banister, MD        REVIEW OF SYSTEMS:    .10 Point review of Systems was done is negative except as noted above.   PHYSICAL EXAMINATION: ECOG PERFORMANCE STATUS: 1 - Symptomatic but completely ambulatory  Vitals:   12/28/20 1440  BP: (!) 134/96  Pulse: 93  Resp: 20  Temp: 98.5 F (36.9 C)  SpO2: 99%   Filed Weights   12/28/20 1440  Weight: 204 lb 6.4 oz (92.7 kg)   .Body mass index is 28.51 kg/m.  Marland Kitchen GENERAL:alert, in no acute distress and comfortable SKIN: no acute rashes, no significant lesions EYES: conjunctiva are pink and non-injected, sclera anicteric OROPHARYNX: MMM, no exudates, no oropharyngeal erythema or ulceration NECK: supple, no JVD LYMPH:  no palpable lymphadenopathy in the cervical, axillary or inguinal regions LUNGS: clear to auscultation b/l with normal respiratory effort HEART: regular rate & rhythm ABDOMEN:  normoactive bowel sounds , non tender, not distended. Extremity: no pedal edema PSYCH: alert & oriented x 3 with fluent speech NEURO: no focal motor/sensory deficits   LABORATORY DATA:  I have reviewed the data as listed  . CBC Latest Ref Rng & Units 12/28/2020 06/05/2020 01/25/2020  WBC 4.0 - 10.5 K/uL 6.7 7.8 6.9  Hemoglobin 13.0 - 17.0 g/dL 15.4 16.0 14.5  Hematocrit 39.0 - 52.0 % 46.0 47.3 42.0  Platelets 150 - 400 K/uL 106(L) 80(L) 97(L)  .CBC    Component Value Date/Time   WBC 6.7 12/28/2020 1433   RBC 5.14 12/28/2020 1433   HGB 15.4 12/28/2020 1433   HGB 10.6 (L) 10/15/2017 0955   HGB 8.6 (L) 05/01/2017 0815   HCT 46.0 12/28/2020 1433   HCT 27.6 (L) 05/01/2017 0815   PLT 106 (L) 12/28/2020 1433   PLT 51 (L) 10/15/2017 0955   PLT 30 (L) 05/01/2017 0815   MCV 89.5 12/28/2020 1433   MCV 87.3 05/01/2017 0815   MCH 30.0 12/28/2020 1433   MCHC 33.5 12/28/2020 1433   RDW 15.2 12/28/2020 1433   RDW 18.4 (H) 05/01/2017 0815   LYMPHSABS 1.3 12/28/2020 1433   LYMPHSABS 1.0 05/01/2017 0815   MONOABS 1.7 (H) 12/28/2020 1433   MONOABS 1.8 (H) 05/01/2017 0815   EOSABS 0.0  12/28/2020 1433   EOSABS 0.0 05/01/2017 0815   BASOSABS 0.0 12/28/2020 1433   BASOSABS 0.3 (H) 05/01/2017 0815    . CMP Latest Ref Rng & Units 12/28/2020 06/05/2020 01/25/2020  Glucose 70 - 99 mg/dL 161(H) 88 137(H)  BUN 8 - 23 mg/dL '11 11 10  '$ Creatinine 0.61 - 1.24 mg/dL 1.05 1.14 0.92  Sodium 135 - 145 mmol/L 140 140 143  Potassium  3.5 - 5.1 mmol/L 5.1 4.9 3.9  Chloride 98 - 111 mmol/L 103 104 109  CO2 22 - 32 mmol/L '28 27 29  '$ Calcium 8.9 - 10.3 mg/dL 9.6 9.8 9.3  Total Protein 6.5 - 8.1 g/dL 7.0 6.7 6.7  Total Bilirubin 0.3 - 1.2 mg/dL 0.8 0.8 0.6  Alkaline Phos 38 - 126 U/L 110 98 107  AST 15 - 41 U/L '24 17 18  '$ ALT 0 - 44 U/L '15 22 18   '$ . Lab Results  Component Value Date   LDH 179 12/28/2020       06/22/17 Cytogenetics Report:   06/22/17 Flow Cytometry:    06/22/17 BM Bx:    RADIOGRAPHIC STUDIES:  I have personally reviewed the radiological images as listed and agreed with the findings in the report. No results found.   11/13/17 PET/CT    ASSESSMENT & PLAN:    Joseph Powell is a 65 y.o. 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 No adenopathy. 2. Stable hypodense liver lesions. These were not overtly FDG avid on PET/CT from 10 2019."  04/07/2019 CT C/A/P revealed "1.  Slight interval increase in size a central small bowel mesentery lymph node, nonspecific. Otherwise, no evidence of new or enlarging adenopathy involving the chest, abdomen or pelvis."  #2 Thrombocytopenia. Patient has some chronic thrombocytopenia 85-125k for a few years. Platelets upon initial visit are down to  36k in the setting of newly diagnosed lymphoma and treatment with R-CHOEP and from hypersplenism but then improved. PLt counts had normalized after treatment of the lymphoma pre-transplant  PLAN: -Discussed pt labwork today, reviewed with patient as noted above.  CBC stable other than some mild thrombocytopenia with platelets of 106k still with some mild monocytosis at 1.7k which has been nonprogressive. -We previously had discussed and recommended pt receive Bm Bx to observe if monocytosis is low grade MDS or not. The conservative approach would be to continue to monitor. The pt wishes to continue to monitor without Bx at this time. -The pt shows no clinical or lab or radiographic progression/return of his peripheral T-Cell lymphoma at this time.  -No indication for further treatment. Will continue watchful observation.  -Discussed recommendation to get new COVID 19 booster when that is available and annual flu shot and ensure he is up to speed with his pneumonia vaccines with his primary care physician. -Will see back in 6 months with labs.    #7 History of Melanoma, 2009 -Followed by Dr. Allyson Sabal for continued skin screening   #8 Patient Active Problem List   Diagnosis Date Noted   Pulmonary infiltrates 06/24/2017   Peripheral T cell lymphoma of intrathoracic lymph nodes ([place]) 03/11/2017   Counseling regarding advanced care planning and goals of care 02/12/2017   Mature T/NK-cell lymphomas, unspecified, lymph nodes of multiple sites ([place]) 02/10/2017   Cough variant asthma vs UACS 01/27/2017   Dyslipidemia 08/05/2016   Mild asthma 08/05/2016   DJD (degenerative joint disease) 08/05/2016   HLD (hyperlipidemia) 07/27/2016   GERD (gastroesophageal reflux disease) 07/27/2016   Gout 07/27/2016   Discomfort in chest 07/27/2016   OSA (obstructive sleep apnea) 09/19/2013  Thrombocytopenia ([place]) 04/14/2011   SCHATZKI'S RING 08/21/2009    FOLLOW UP: RTC with Dr Irene Limbo with labs in 6  months  . The total time spent in the appointment was 20 minutes and more than 50% was on counseling and direct patient cares.   All of the patient's questions were answered with apparent satisfaction. The patient knows to call the clinic with any problems, questions or concerns.    Sullivan Lone MD [place] AAHIVMS [facility] Hematology/Oncology Physician [facility]  (Office):       [phone] (Work cell):  [phone] (Fax):           [phone]

## 2021-01-31 ENCOUNTER — Other Ambulatory Visit: Payer: Self-pay

## 2021-01-31 ENCOUNTER — Ambulatory Visit (INDEPENDENT_AMBULATORY_CARE_PROVIDER_SITE_OTHER): Payer: BC Managed Care – PPO | Admitting: Internal Medicine

## 2021-01-31 ENCOUNTER — Encounter: Payer: Self-pay | Admitting: Internal Medicine

## 2021-01-31 VITALS — BP 118/68 | HR 83 | Ht 70.0 in | Wt 210.0 lb

## 2021-01-31 DIAGNOSIS — Z23 Encounter for immunization: Secondary | ICD-10-CM

## 2021-01-31 DIAGNOSIS — J45991 Cough variant asthma: Secondary | ICD-10-CM | POA: Diagnosis not present

## 2021-01-31 LAB — CBC WITH DIFFERENTIAL/PLATELET
Basophils Absolute: 0 10*3/uL (ref 0.0–0.1)
Basophils Relative: 0.4 % (ref 0.0–3.0)
Eosinophils Absolute: 0 10*3/uL (ref 0.0–0.7)
Eosinophils Relative: 0.3 % (ref 0.0–5.0)
HCT: 43.1 % (ref 39.0–52.0)
Hemoglobin: 14.6 g/dL (ref 13.0–17.0)
Lymphocytes Relative: 12.3 % (ref 12.0–46.0)
Lymphs Abs: 0.6 10*3/uL — ABNORMAL LOW (ref 0.7–4.0)
MCHC: 33.8 g/dL (ref 30.0–36.0)
MCV: 93.1 fl (ref 78.0–100.0)
Monocytes Absolute: 2 10*3/uL — ABNORMAL HIGH (ref 0.1–1.0)
Monocytes Relative: 42.9 % — ABNORMAL HIGH (ref 3.0–12.0)
Neutro Abs: 2.1 10*3/uL (ref 1.4–7.7)
Neutrophils Relative %: 44.1 % (ref 43.0–77.0)
Platelets: 81 10*3/uL — ABNORMAL LOW (ref 150.0–400.0)
RBC: 4.63 Mil/uL (ref 4.22–5.81)
RDW: 16.7 % — ABNORMAL HIGH (ref 11.5–15.5)
WBC: 4.7 10*3/uL (ref 4.0–10.5)

## 2021-01-31 MED ORDER — DULERA 100-5 MCG/ACT IN AERO: 2.0000 | INHALATION_SPRAY | Freq: Two times a day (BID) | RESPIRATORY_TRACT | 11 refills | Status: DC

## 2021-01-31 NOTE — Assessment & Plan Note (Signed)
Onset in his 34s CT abd 01/21/17 with nonspecific and very mild tree in bud pattern 01/27/2017  After extensive coaching HFA effectiveness =    90% > try duler 100 2bid  - Spirometry 03/10/2017  FEV1 3.29 (89%)  Ratio 86 p am Dulera 100 x 2 pffs  - 03/10/2017  Walked RA x 3 laps @ 185 ft each stopped due to  End of study, fast pace, no sob or desat    - 06/23/2017  After extensive coaching inhaler device  effectiveness =    75% > continue symb 80 or dulera 100 2bid - Allergy profile 01/31/2021 >  Eos 0. /  IgE    Despite poorly controlled rhinitis >>> All goals of chronic asthma control met including optimal function and elimination of symptoms with minimal need for rescue therapy.  Contingencies discussed in full including contacting this office immediately if not controlling the symptoms using the rule of two's.     >>> check allergy screen and increase 1st gen H1 blockers per guidelines    F/u yearly          Each maintenance medication was reviewed in detail including emphasizing most importantly the difference between maintenance and prns and under what circumstances the prns are to be triggered using an action plan format where appropriate.  Total time for H and P, chart review, counseling, reviewing hfa  device(s) and generating customized AVS unique to this office visit / same day charting = 25 min

## 2021-01-31 NOTE — Patient Instructions (Addendum)
Strongly consider the new omicron variant vaccine   No change in medications  - can use chlorpheniramine up to  2 every 4 hours as needed   Please remember to go to the lab department   for your tests - we will call you with the results when they are available.       .Please schedule a follow up visit in 12  months but call sooner if needed

## 2021-01-31 NOTE — Progress Notes (Signed)
Subjective:     Patient ID: Joseph Powell, male   DOB: 05/19/1955     MRN: [id number]    Brief patient profile:  45 yowm never smoker never problems with allergies/ asthma onset of pnds seasonally in 40s better on clarinex but eventually started needed year round but even then  gradually worse sense of pnds then daily cough x 2015 referred to pulmonary clinic 01/27/2017 by Dr   Hulan Fess with recently detected widespread adenopathy c/w T cell lymphoma.     History of Present Illness  01/27/2017 1st Joseph Powell   Chief Complaint  Patient presents with   Pulmonary Consult    Referred by Dr. Hulan Fess. Pt c/o cough off and on for the past 3 years. He is not producing any sputum at this point- just finished pred taper and round of abx. He states that when he tries to take in a deep breath it triggers him to cough.  He has an albuterol inhaler that he typically uses 3 x per month.   chronic pnds x decades then worse cough esp spring and fall much better with prednisone which tapered off one day prior to OV  But s excess/ purulent sputum or mucus plugs   ppi seemed to help some with neg EGD 12/05/16 and continuing to take ppi but not always 30 m ac  Not limited by breathing from desired activities but senses can't take a deep breath s provoking cough which is worse with ex   ? Some better with saba but rarely uses  rec Continue protonix 40 mg Take 30-60 min before first meal of the day  GERD   Dulera 100 Take 2 puffs first thing in am and then another 2 puffs about 12 hours later.  Work on inhaler technique:   If still can't stop coughing > tessalon 200 mg  Up to three times daily as needed  For drainage / throat tickle try instead of clarinex take CHLORPHENIRAMINE  4 mg - take one every 4 hours as needed - available over the counter- may cause drowsiness so start with just a bedtime dose or two and see how you tolerate it before trying in daytime    03/10/2017   f/u ov/Joseph Powell re:  Cough variant asthma improved on dulera 100 2bid  Chief Complaint  Patient presents with   Follow-up    Cough is "99% better". Still clearing his throat often.    no problem with noct cough  Exercise > sob = MMRC1 = can walk nl pace, flat grade, can't hurry or go uphills or steps s sob   No additional changes in resp symptoms  when on pred for chemo for lymphoma x one week cycles  rec Plan A = Automatic = dulera 100 Take 2 puffs first thing in am and then another 2 puffs about 12 hours later.  Plan B = Backup Only use your albuterol as a rescue medication Try to suppress cough with either hard rock candy or Tessalon to prevent throat clearing  For drainage / throat tickle try take CHLORPHENIRAMINE  4 mg - take one every 4 hours as needed    Last ov with oncology 06/15/17 NHL  "CURRENT THERAPY: R-CHOEP s/p 6 cycles"  Last rx 06/03/17     06/23/2017   Acute office consultation/Joseph Powell re: worse cough and PET abnormalities  Chief Complaint  Patient presents with   Pulmonary Consult    Referred back by [name] for  eval of abnormal PET scan. Pt states his cough has been worse over the past wk- prod with clear sputum.  His breathing is unchangd since last visit. He is using his albuterol inhaler 4-5 x per wk on average.   Dyspnea = baseline = MMRC1 = can walk nl pace, flat grade, can't hurry or go uphills or steps s sob   Cough: worse after supper esp x one week but only minimal mucoid sputum   Sleep: ok flat no 02  Looking at stem cell transplant at [facility] Started on levaquin  06/22/17 no change yet  Rec Finish levaquin and then rec  repeat CT sinus and Chest to see what if any further treatment is needed or if lung bx is required prior to stem cell transplant  symbicort 80 = dulera 100  Take 2 puffs first thing in am and then another 2 puffs about 12 hours later.  Work on inhaler technique:   For cough > mucinex dm 1200 mg every 12 hours as needed Make sure protonix is Take  30- 60 min before your first and last meals of the day  GERD diet reviewed, bed blocks rec   Stem cell transplant 06/2017   01/31/2021  f/u ov/Joseph Powell re: cough variant asthma/ abn PET    maint on dulera 2bid   Chief Complaint  Patient presents with   Follow-up    Breathing is doing well. He has minimal PND "happens this time every year".    Dyspnea:  Not limited by breathing from desired activities  / s/p L TKR  Cough: better most of the year/ worse fall > spring rx 1st gen H1 blockers per guidelines and clariton as well with 1st gen better result and don't make him sleepy   Sleeping: ok flat / one pillow SABA use: none  02: none  Covid status:   vax x 1 JJ   No obvious day to day or daytime variability or assoc excess/ purulent sputum or mucus plugs or hemoptysis or cp or chest tightness, subjective wheeze or overt sinus or hb symptoms.   Skeeping  without nocturnal  or early am exacerbation  of respiratory  c/o's or need for noct saba. Also denies any obvious fluctuation of symptoms with weather or environmental changes or other aggravating or alleviating factors except as outlined above   No unusual exposure hx or h/o childhood pna/ asthma or knowledge of premature birth.  Current Allergies, Complete Past Medical History, Past Surgical History, Family History, and Social History were reviewed in [employer] record.  ROS  The following are not active complaints unless bolded Hoarseness, sore throat, dysphagia, dental problems, itching, sneezing,  nasal congestion or discharge of excess mucus or purulent secretions, ear ache,   fever, chills, sweats, unintended wt loss or wt gain, classically pleuritic or exertional cp,  orthopnea pnd or arm/hand swelling  or leg swelling, presyncope, palpitations, abdominal pain, anorexia, nausea, vomiting, diarrhea  or change in bowel habits or change in bladder habits, change in stools or change in urine, dysuria, hematuria,  rash,  arthralgias, visual complaints, headache, numbness, weakness or ataxia or problems with walking or coordination,  change in mood or  memory.        Current Meds  Medication Sig   carvedilol (COREG) 3.125 MG tablet TAKE 1 TABLET BY MOUTH TWICE DAILY WITH A MEAL   loratadine (CLARITIN) 10 MG tablet Take 10 mg by mouth daily.   mometasone-formoterol (DULERA) 100-5 MCG/ACT AERO Inhale  2 puffs into the lungs 2 (two) times daily.   Vitamin D, Ergocalciferol, (DRISDOL) 1.25 MG (50000 UNIT) CAPS capsule Take 1 capsule (50,000 Units total) by mouth once a week.   Current Facility-Administered Medications for the 01/31/21 encounter (Office Visit) with Joseph Rockers, MD  Medication   0.9 %  sodium chloride infusion                          Objective:   Physical Exam    01/31/2021       210  06/23/2017       166    01/27/17 184 lb (83.5 kg)  01/26/17 183 lb 11.2 oz (83.3 kg)  01/20/17 182 lb (82.6 kg)     Vital signs reviewed  01/31/2021  - Note at rest 02 sats  100% on RA   General appearance:   amb pleasant wm nasal tone to voice         HEENT : pt wearing mask not removed for exam due to covid -19 concerns.    NECK :  without JVD/Nodes/TM/ nl carotid upstrokes bilaterally   LUNGS: no acc muscle use,  Nl contour chest which is clear to A and P bilaterally without cough on insp or exp maneuvers   CV:  RRR  no s3 or murmur or increase in P2, and no edema   ABD:  soft and nontender with nl inspiratory excursion in the supine position. No bruits or organomegaly appreciated, bowel sounds nl  MS:  Nl gait/ ext warm without deformities, calf tenderness, cyanosis or clubbing No obvious joint restrictions   SKIN: warm and dry without lesions    NEURO:  alert, approp, nl sensorium with  no motor or cerebellar deficits apparent.   Assessment:

## 2021-02-01 LAB — IGE: IgE (Immunoglobulin E), Serum: <2 kU/L (ref <=114)

## 2021-03-05 ENCOUNTER — Encounter: Payer: Self-pay | Admitting: Internal Medicine

## 2021-03-05 ENCOUNTER — Ambulatory Visit (INDEPENDENT_AMBULATORY_CARE_PROVIDER_SITE_OTHER): Payer: BC Managed Care – PPO | Admitting: Internal Medicine

## 2021-03-05 ENCOUNTER — Other Ambulatory Visit: Payer: Self-pay

## 2021-03-05 VITALS — BP 138/86 | HR 97 | Temp 98.4°F | Resp 16 | Ht 70.0 in | Wt 207.0 lb

## 2021-03-05 DIAGNOSIS — R809 Proteinuria, unspecified: Secondary | ICD-10-CM

## 2021-03-05 DIAGNOSIS — J301 Allergic rhinitis due to pollen: Secondary | ICD-10-CM

## 2021-03-05 DIAGNOSIS — D696 Thrombocytopenia, unspecified: Secondary | ICD-10-CM | POA: Diagnosis not present

## 2021-03-05 DIAGNOSIS — E118 Type 2 diabetes mellitus with unspecified complications: Secondary | ICD-10-CM | POA: Diagnosis not present

## 2021-03-05 DIAGNOSIS — E782 Mixed hyperlipidemia: Secondary | ICD-10-CM | POA: Insufficient documentation

## 2021-03-05 DIAGNOSIS — E785 Hyperlipidemia, unspecified: Secondary | ICD-10-CM

## 2021-03-05 DIAGNOSIS — E119 Type 2 diabetes mellitus without complications: Secondary | ICD-10-CM | POA: Insufficient documentation

## 2021-03-05 DIAGNOSIS — I10 Essential (primary) hypertension: Secondary | ICD-10-CM

## 2021-03-05 DIAGNOSIS — Z0001 Encounter for general adult medical examination with abnormal findings: Secondary | ICD-10-CM

## 2021-03-05 LAB — LIPID PANEL
Cholesterol: 221 mg/dL — ABNORMAL HIGH (ref 0–200)
HDL: 31.1 mg/dL — ABNORMAL LOW (ref >39.00)
LDL Cholesterol: 160 mg/dL — ABNORMAL HIGH (ref 0–99)
NonHDL: 190.06
Total CHOL/HDL Ratio: 7
Triglycerides: 149 mg/dL (ref 0.0–149.0)
VLDL: 29.8 mg/dL (ref 0.0–40.0)

## 2021-03-05 LAB — URINALYSIS, ROUTINE W REFLEX MICROSCOPIC
Bilirubin Urine: NEGATIVE
Hgb urine dipstick: NEGATIVE
Ketones, ur: NEGATIVE
Leukocytes,Ua: NEGATIVE
Nitrite: NEGATIVE
Specific Gravity, Urine: 1.02 (ref 1.000–1.030)
Total Protein, Urine: 30 — AB
Urine Glucose: NEGATIVE
Urobilinogen, UA: 0.2 (ref 0.0–1.0)
pH: 6 (ref 5.0–8.0)

## 2021-03-05 LAB — TSH: TSH: 5.07 u[IU]/mL (ref 0.35–5.50)

## 2021-03-05 LAB — VITAMIN B12: Vitamin B-12: 400 pg/mL (ref 211–911)

## 2021-03-05 LAB — HEMOGLOBIN A1C: Hgb A1c MFr Bld: 6 % (ref 4.6–6.5)

## 2021-03-05 LAB — MICROALBUMIN / CREATININE URINE RATIO
Creatinine,U: 163.9 mg/dL
Microalb Creat Ratio: 16.3 mg/g (ref 0.0–30.0)
Microalb, Ur: 26.7 mg/dL — ABNORMAL HIGH (ref 0.0–1.9)

## 2021-03-05 LAB — PSA: PSA: 0.5 ng/mL (ref 0.10–4.00)

## 2021-03-05 LAB — FOLATE: Folate: 9.1 ng/mL (ref >5.9)

## 2021-03-05 MED ORDER — LEVOCETIRIZINE DIHYDROCHLORIDE 5 MG PO TABS: 5.0000 mg | ORAL_TABLET | Freq: Every evening | ORAL | 1 refills | Status: DC

## 2021-03-05 MED ORDER — OLMESARTAN MEDOXOMIL 20 MG PO TABS: 20.0000 mg | ORAL_TABLET | Freq: Every day | ORAL | 1 refills | Status: DC

## 2021-03-05 MED ORDER — ROSUVASTATIN CALCIUM 20 MG PO TABS: 20.0000 mg | ORAL_TABLET | Freq: Every day | ORAL | 1 refills | Status: DC

## 2021-03-05 NOTE — Patient Instructions (Signed)
Health Maintenance, Male Adopting a healthy lifestyle and getting preventive care are important in promoting health and wellness. Ask your health care provider about: The right schedule for you to have regular tests and exams. Things you can do on your own to prevent diseases and keep yourself healthy. What should I know about diet, weight, and exercise? Eat a healthy diet  Eat a diet that includes plenty of vegetables, fruits, low-fat dairy products, and lean protein. Do not eat a lot of foods that are high in solid fats, added sugars, or sodium. Maintain a healthy weight Body mass index (BMI) is a measurement that can be used to identify possible weight problems. It estimates body fat based on height and weight. Your health care provider can help determine your BMI and help you achieve or maintain a healthy weight. Get regular exercise Get regular exercise. This is one of the most important things you can do for your health. Most adults should: Exercise for at least 150 minutes each week. The exercise should increase your heart rate and make you sweat (moderate-intensity exercise). Do strengthening exercises at least twice a week. This is in addition to the moderate-intensity exercise. Spend less time sitting. Even light physical activity can be beneficial. Watch cholesterol and blood lipids Have your blood tested for lipids and cholesterol at 65 years of age, then have this test every 5 years. You may need to have your cholesterol levels checked more often if: Your lipid or cholesterol levels are high. You are older than 65 years of age. You are at high risk for heart disease. What should I know about cancer screening? Many types of cancers can be detected early and may often be prevented. Depending on your health history and family history, you may need to have cancer screening at various ages. This may include screening for: Colorectal cancer. Prostate cancer. Skin cancer. Lung  cancer. What should I know about heart disease, diabetes, and high blood pressure? Blood pressure and heart disease High blood pressure causes heart disease and increases the risk of stroke. This is more likely to develop in people who have high blood pressure readings, are of African descent, or are overweight. Talk with your health care provider about your target blood pressure readings. Have your blood pressure checked: Every 3-5 years if you are 18-39 years of age. Every year if you are 40 years old or older. If you are between the ages of 65 and 75 and are a current or former smoker, ask your health care provider if you should have a one-time screening for abdominal aortic aneurysm (AAA). Diabetes Have regular diabetes screenings. This checks your fasting blood sugar level. Have the screening done: Once every three years after age 45 if you are at a normal weight and have a low risk for diabetes. More often and at a younger age if you are overweight or have a high risk for diabetes. What should I know about preventing infection? Hepatitis B If you have a higher risk for hepatitis B, you should be screened for this virus. Talk with your health care provider to find out if you are at risk for hepatitis B infection. Hepatitis C Blood testing is recommended for: Everyone born from 1945 through 1965. Anyone with known risk factors for hepatitis C. Sexually transmitted infections (STIs) You should be screened each year for STIs, including gonorrhea and chlamydia, if: You are sexually active and are younger than 65 years of age. You are older than 65 years   of age and your health care provider tells you that you are at risk for this type of infection. Your sexual activity has changed since you were last screened, and you are at increased risk for chlamydia or gonorrhea. Ask your health care provider if you are at risk. Ask your health care provider about whether you are at high risk for HIV.  Your health care provider may recommend a prescription medicine to help prevent HIV infection. If you choose to take medicine to prevent HIV, you should first get tested for HIV. You should then be tested every 3 months for as long as you are taking the medicine. Follow these instructions at home: Lifestyle Do not use any products that contain nicotine or tobacco, such as cigarettes, e-cigarettes, and chewing tobacco. If you need help quitting, ask your health care provider. Do not use street drugs. Do not share needles. Ask your health care provider for help if you need support or information about quitting drugs. Alcohol use Do not drink alcohol if your health care provider tells you not to drink. If you drink alcohol: Limit how much you have to 0-2 drinks a day. Be aware of how much alcohol is in your drink. In the [place] one drink equals one 12 oz bottle of beer (355 mL), one 5 oz glass of wine (148 mL), or one 1 oz glass of hard liquor (44 mL). General instructions Schedule regular health, dental, and eye exams. Stay current with your vaccines. Tell your health care provider if: You often feel depressed. You have ever been abused or do not feel safe at home. Summary Adopting a healthy lifestyle and getting preventive care are important in promoting health and wellness. Follow your health care provider's instructions about healthy diet, exercising, and getting tested or screened for diseases. Follow your health care provider's instructions on monitoring your cholesterol and blood pressure. This information is not intended to replace advice given to you by your health care provider. Make sure you discuss any questions you have with your health care provider. Document Revised: 06/29/2020 Document Reviewed: 04/14/2018 Elsevier Patient Education  2022 Else[employer] Inc.  

## 2021-03-05 NOTE — Progress Notes (Signed)
Subjective:  Patient ID: Joseph Powell, male    DOB: 1955/12/07  Age: 64 y.o. MRN: [id number]  CC: Annual Exam and Hyperlipidemia  This visit occurred during the SARS-CoV-2 public health emergency.  Safety protocols were in place, including screening questions prior to the visit, additional usage of staff PPE, and extensive cleaning of exam room while observing appropriate contact time as indicated for disinfecting solutions.    HPI Joseph Powell presents for a CPX and to establish.  He complains of chronic runny nose, postnasal drip, and cough productive of clear phlegm.  He has not gotten much symptom relief with Claritin.  He walks about 2 to 3 miles a day and does not experience chest pain, shortness of breath, diaphoresis, dizziness, lightheadedness, or edema.  History Joseph Powell has a past medical history of Allergic rhinitis, Asthma, Bell's palsy, GERD (gastroesophageal reflux disease), Gout, Hypercholesteremia, Melanoma ([place]), Osteoarthritis, Sleep apnea, Thrombocytopenia ([place]), and Tubular adenoma.   He has a past surgical history that includes Knee surgery (1993); Vasectomy (1999); Nasal reconstruction; Melanoma excision; IR US Guide Vasc Access Right (02/17/2017); IR FLUORO GUIDE PORT INSERTION RIGHT (02/17/2017); and IR REMOVAL TUN ACCESS W/ PORT W/O FL MOD SED (01/05/2018).   His family history includes Asthma in his mother; Colon cancer in his paternal uncle; Diabetes in his mother; Gout in his brother.He reports that he has never smoked. He has never used smokeless tobacco. He reports current alcohol use of about 5.0 standard drinks per week. He reports that he does not use drugs.  Outpatient Medications Prior to Visit  Medication Sig Dispense Refill   carvedilol (COREG) 3.125 MG tablet TAKE 1 TABLET BY MOUTH TWICE DAILY WITH A MEAL 180 tablet 3   mometasone-formoterol (DULERA) 100-5 MCG/ACT AERO Inhale 2 puffs into the lungs 2 (two) times daily. 13 g 11   Vitamin D,  Ergocalciferol, (DRISDOL) 1.25 MG (50000 UNIT) CAPS capsule Take 1 capsule (50,000 Units total) by mouth once a week. 12 capsule 0   loratadine (CLARITIN) 10 MG tablet Take 10 mg by mouth daily.     0.9 %  sodium chloride infusion      No facility-administered medications prior to visit.    ROS Review of Systems  Constitutional:  Negative for appetite change, chills, diaphoresis, fatigue, fever and unexpected weight change.  HENT:  Positive for postnasal drip and rhinorrhea. Negative for congestion, sinus pressure, sinus pain, sneezing, sore throat and voice change.   Cardiovascular:  Negative for chest pain, palpitations and leg swelling.  Gastrointestinal: Negative.  Negative for abdominal pain, blood in stool, constipation and vomiting.  Genitourinary: Negative.  Negative for difficulty urinating, hematuria, scrotal swelling and testicular pain.  Musculoskeletal: Negative.   Skin: Negative.   Neurological:  Negative for dizziness and weakness.  Hematological:  Negative for adenopathy. Does not bruise/bleed easily.  Psychiatric/Behavioral: Negative.     Objective:  BP 138/86 (BP Location: Right Arm, Patient Position: Sitting, Cuff Size: Large)   Pulse 97   Temp 98.4 F (36.9 C) (Oral)   Resp 16   Ht 5\' 10"  (1.778 m)   Wt 207 lb (93.9 kg)   SpO2 97%   BMI 29.70 kg/m   Physical Exam Vitals reviewed.  Constitutional:      Appearance: Normal appearance.  HENT:     Nose: Nose normal.     Mouth/Throat:     Mouth: Mucous membranes are moist.  Eyes:     General: No scleral icterus.    Conjunctiva/sclera: Conjunctivae  normal.  Cardiovascular:     Rate and Rhythm: Normal rate and regular rhythm.     Heart sounds: No murmur heard. Pulmonary:     Effort: Pulmonary effort is normal.     Breath sounds: No stridor. No wheezing, rhonchi or rales.  Abdominal:     General: Abdomen is flat. Bowel sounds are normal. There is no distension.     Palpations: Abdomen is soft. There is no  hepatomegaly, splenomegaly or mass.     Tenderness: There is no abdominal tenderness. There is no guarding.     Hernia: No hernia is present. There is no hernia in the left inguinal area or right inguinal area.  Genitourinary:    Pubic Area: No rash.      Penis: Normal and circumcised.      Testes: Normal.     Epididymis:     Right: Normal.     Left: Normal.     Prostate: Normal. Not enlarged, not tender and no nodules present.     Rectum: Normal. Guaiac result negative. No mass, tenderness, anal fissure, external hemorrhoid or internal hemorrhoid. Normal anal tone.  Musculoskeletal:        General: Normal range of motion.     Cervical back: Neck supple.     Right lower leg: No edema.     Left lower leg: No edema.  Lymphadenopathy:     Cervical: No cervical adenopathy.     Lower Body: No right inguinal adenopathy. No left inguinal adenopathy.  Skin:    General: Skin is warm and dry.  Neurological:     General: No focal deficit present.     Mental Status: He is alert.  Psychiatric:        Mood and Affect: Mood normal.        Behavior: Behavior normal.    Lab Results  Component Value Date   WBC 4.7 01/31/2021   HGB 14.6 01/31/2021   HCT 43.1 01/31/2021   PLT 81.0 (L) 01/31/2021   GLUCOSE 161 (H) 12/28/2020   CHOL 221 (H) 03/05/2021   TRIG 149.0 03/05/2021   HDL 31.10 (L) 03/05/2021   LDLCALC 160 (H) 03/05/2021   ALT 15 12/28/2020   AST 24 12/28/2020   NA 140 12/28/2020   K 5.1 12/28/2020   CL 103 12/28/2020   CREATININE 1.05 12/28/2020   BUN 11 12/28/2020   CO2 28 12/28/2020   TSH 5.07 03/05/2021   PSA 0.50 03/05/2021   INR 1.09 01/05/2018   HGBA1C 6.0 03/05/2021   MICROALBUR 26.7 (H) 03/05/2021     Assessment & Plan:   Joseph Powell was seen today for annual exam and hyperlipidemia.  Diagnoses and all orders for this visit:  Thrombocytopenia ([place])- His platelet count remains mildly low.  There is no history of bleeding or bruising.  B12 and folate are normal.   This is likely ITP related to his history of lymphoma. -     Folate; Future -     Vitamin B12; Future -     Vitamin B12 -     Folate  Type II diabetes mellitus with manifestations ([place])- His blood sugars are well controlled. -     Microalbumin / creatinine urine ratio; Future -     Hemoglobin A1c; Future -     Urinalysis, Routine w reflex microscopic; Future -     Urinalysis, Routine w reflex microscopic -     Hemoglobin A1c -     Microalbumin / creatinine urine ratio  Hyperlipidemia with target LDL less than 100- His ASCVD risk score is 35%.  I recommended that he take a statin for cardiovascular risk reduction. -     TSH; Future -     TSH -     rosuvastatin (CRESTOR) 20 MG tablet; Take 1 tablet (20 mg total) by mouth daily.  Encounter for general adult medical examination with abnormal findings- Exam completed, labs reviewed, vaccines are up-to-date, cancer screenings are up-to-date, patient education material was given. -     PSA; Future -     Lipid panel; Future -     Lipid panel -     PSA  Non-seasonal allergic rhinitis due to pollen -     levocetirizine (XYZAL) 5 MG tablet; Take 1 tablet (5 mg total) by mouth every evening.  Primary hypertension- He has not achieved his blood pressure goal of 130/80 and has microalbuminuria.  I recommended that he start taking an ARB. -     olmesartan (BENICAR) 20 MG tablet; Take 1 tablet (20 mg total) by mouth daily.  Urine test positive for microalbuminuria -     olmesartan (BENICAR) 20 MG tablet; Take 1 tablet (20 mg total) by mouth daily.  I have discontinued Timothee L. Wanat's loratadine. I am also having him start on levocetirizine, rosuvastatin, and olmesartan. Additionally, I am having him maintain his carvedilol, Vitamin D (Ergocalciferol), and Dulera. We will stop administering sodium chloride.  Meds ordered this encounter  Medications   levocetirizine (XYZAL) 5 MG tablet    Sig: Take 1 tablet (5 mg total) by mouth every  evening.    Dispense:  90 tablet    Refill:  1   rosuvastatin (CRESTOR) 20 MG tablet    Sig: Take 1 tablet (20 mg total) by mouth daily.    Dispense:  90 tablet    Refill:  1   olmesartan (BENICAR) 20 MG tablet    Sig: Take 1 tablet (20 mg total) by mouth daily.    Dispense:  90 tablet    Refill:  1     Follow-up: Return in about 6 months (around 09/02/2021).  Scarlette Calico, MD

## 2021-03-07 ENCOUNTER — Encounter: Payer: Self-pay | Admitting: Internal Medicine

## 2021-03-11 ENCOUNTER — Other Ambulatory Visit: Payer: Self-pay | Admitting: Internal Medicine

## 2021-05-09 DIAGNOSIS — J209 Acute bronchitis, unspecified: Secondary | ICD-10-CM | POA: Diagnosis not present

## 2021-06-22 DIAGNOSIS — M109 Gout, unspecified: Secondary | ICD-10-CM | POA: Diagnosis not present

## 2021-06-22 DIAGNOSIS — C8590 Non-Hodgkin lymphoma, unspecified, unspecified site: Secondary | ICD-10-CM | POA: Diagnosis not present

## 2021-06-28 ENCOUNTER — Inpatient Hospital Stay: Payer: BC Managed Care – PPO | Attending: Hematology

## 2021-06-28 ENCOUNTER — Inpatient Hospital Stay (HOSPITAL_BASED_OUTPATIENT_CLINIC_OR_DEPARTMENT_OTHER): Payer: BC Managed Care – PPO | Admitting: Hematology

## 2021-06-28 ENCOUNTER — Other Ambulatory Visit: Payer: Self-pay

## 2021-06-28 VITALS — BP 136/84 | HR 74 | Temp 97.7°F | Resp 20 | Wt 209.9 lb

## 2021-06-28 DIAGNOSIS — C8442 Peripheral T-cell lymphoma, not classified, intrathoracic lymph nodes: Secondary | ICD-10-CM

## 2021-06-28 DIAGNOSIS — E119 Type 2 diabetes mellitus without complications: Secondary | ICD-10-CM | POA: Diagnosis not present

## 2021-06-28 DIAGNOSIS — Z79899 Other long term (current) drug therapy: Secondary | ICD-10-CM | POA: Diagnosis not present

## 2021-06-28 DIAGNOSIS — E785 Hyperlipidemia, unspecified: Secondary | ICD-10-CM | POA: Insufficient documentation

## 2021-06-28 DIAGNOSIS — D696 Thrombocytopenia, unspecified: Secondary | ICD-10-CM | POA: Diagnosis not present

## 2021-06-28 LAB — CBC WITH DIFFERENTIAL/PLATELET
Abs Immature Granulocytes: 0.57 10*3/uL — ABNORMAL HIGH (ref 0.00–0.07)
Basophils Absolute: 0.1 10*3/uL (ref 0.0–0.1)
Basophils Relative: 2 %
Eosinophils Absolute: 0 10*3/uL (ref 0.0–0.5)
Eosinophils Relative: 0 %
HCT: 42.9 % (ref 39.0–52.0)
Hemoglobin: 15.2 g/dL (ref 13.0–17.0)
Immature Granulocytes: 12 %
Lymphocytes Relative: 17 %
Lymphs Abs: 0.8 10*3/uL (ref 0.7–4.0)
MCH: 31.8 pg (ref 26.0–34.0)
MCHC: 35.4 g/dL (ref 30.0–36.0)
MCV: 89.7 fL (ref 80.0–100.0)
Monocytes Absolute: 2.1 10*3/uL — ABNORMAL HIGH (ref 0.1–1.0)
Monocytes Relative: 46 %
Neutro Abs: 1.1 10*3/uL — ABNORMAL LOW (ref 1.7–7.7)
Neutrophils Relative %: 23 %
Platelets: 100 10*3/uL — ABNORMAL LOW (ref 150–400)
RBC: 4.78 MIL/uL (ref 4.22–5.81)
RDW: 13.2 % (ref 11.5–15.5)
WBC: 4.7 10*3/uL (ref 4.0–10.5)
nRBC: 0 % (ref 0.0–0.2)

## 2021-06-28 LAB — CMP (CANCER CENTER ONLY)
ALT: 23 U/L (ref 0–44)
AST: 30 U/L (ref 15–41)
Albumin: 4.5 g/dL (ref 3.5–5.0)
Alkaline Phosphatase: 88 U/L (ref 38–126)
Anion gap: 8 (ref 5–15)
BUN: 16 mg/dL (ref 8–23)
CO2: 29 mmol/L (ref 22–32)
Calcium: 9.6 mg/dL (ref 8.9–10.3)
Chloride: 101 mmol/L (ref 98–111)
Creatinine: 0.9 mg/dL (ref 0.61–1.24)
GFR, Estimated: >60 mL/min (ref >60)
Glucose, Bld: 196 mg/dL — ABNORMAL HIGH (ref 70–99)
Potassium: 3.7 mmol/L (ref 3.5–5.1)
Sodium: 138 mmol/L (ref 135–145)
Total Bilirubin: 0.8 mg/dL (ref 0.3–1.2)
Total Protein: 6.5 g/dL (ref 6.5–8.1)

## 2021-06-28 LAB — LACTATE DEHYDROGENASE: LDH: 140 U/L (ref 98–192)

## 2021-07-02 ENCOUNTER — Other Ambulatory Visit: Payer: Self-pay

## 2021-07-02 ENCOUNTER — Encounter: Payer: Self-pay | Admitting: Adult Health

## 2021-07-02 ENCOUNTER — Ambulatory Visit (INDEPENDENT_AMBULATORY_CARE_PROVIDER_SITE_OTHER): Payer: BC Managed Care – PPO

## 2021-07-02 ENCOUNTER — Ambulatory Visit (INDEPENDENT_AMBULATORY_CARE_PROVIDER_SITE_OTHER): Payer: BC Managed Care – PPO | Admitting: Adult Health

## 2021-07-02 VITALS — BP 136/80 | HR 85 | Ht 70.0 in | Wt 212.0 lb

## 2021-07-02 DIAGNOSIS — D225 Melanocytic nevi of trunk: Secondary | ICD-10-CM | POA: Diagnosis not present

## 2021-07-02 DIAGNOSIS — J45991 Cough variant asthma: Secondary | ICD-10-CM

## 2021-07-02 DIAGNOSIS — L819 Disorder of pigmentation, unspecified: Secondary | ICD-10-CM | POA: Diagnosis not present

## 2021-07-02 DIAGNOSIS — L570 Actinic keratosis: Secondary | ICD-10-CM | POA: Diagnosis not present

## 2021-07-02 DIAGNOSIS — L814 Other melanin hyperpigmentation: Secondary | ICD-10-CM | POA: Diagnosis not present

## 2021-07-02 DIAGNOSIS — R059 Cough, unspecified: Secondary | ICD-10-CM | POA: Diagnosis not present

## 2021-07-02 DIAGNOSIS — L821 Other seborrheic keratosis: Secondary | ICD-10-CM | POA: Diagnosis not present

## 2021-07-02 MED ORDER — BENZONATATE 200 MG PO CAPS: 200.0000 mg | ORAL_CAPSULE | Freq: Three times a day (TID) | ORAL | 1 refills | Status: DC | PRN

## 2021-07-02 MED ORDER — PREDNISONE 20 MG PO TABS: 20.0000 mg | ORAL_TABLET | Freq: Every day | ORAL | 0 refills | Status: DC

## 2021-07-02 NOTE — Assessment & Plan Note (Signed)
Exacerbation with probable post viral cough Exhaled nitric oxide testing is okay. We will check chest x-ray.  Begin cough suppression regimen and trigger prevention.  Short course of prednisone.  Plan  Patient Instructions  Prednisone 20 mg daily for 5 days Delsym 2 teaspoons twice daily for cough as needed Tessalon Perles 3 times daily for cough as needed Continue on Xyzal daily in the morning Begin Chlortab (Chlorpheniramine) 4 mg at bedtime as needed for drainage and tickle in throat Chest x-ray today Continue on Dulera 2 puffs twice daily, rinse after use Begin Pepcid 20 mg at bedtime for 2 weeks then as needed Follow-up in 6 weeks with Dr. Melvyn Novas and as needed Please contact office for sooner follow up if symptoms do not improve or worsen or seek emergency care

## 2021-07-02 NOTE — Patient Instructions (Signed)
Prednisone 20 mg daily for 5 days Delsym 2 teaspoons twice daily for cough as needed Tessalon Perles 3 times daily for cough as needed Continue on Xyzal daily in the morning Begin Chlortab (Chlorpheniramine) 4 mg at bedtime as needed for drainage and tickle in throat Chest x-ray today Continue on Dulera 2 puffs twice daily, rinse after use Begin Pepcid 20 mg at bedtime for 2 weeks then as needed Follow-up in 6 weeks with Dr. Melvyn Novas and as needed Please contact office for sooner follow up if symptoms do not improve or worsen or seek emergency care

## 2021-07-02 NOTE — Progress Notes (Signed)
@Patient  ID: Joseph Powell, male    DOB: [birth year], 66 y.o.   MRN: [id number]  Chief Complaint  Patient presents with   Follow-up    Referring provider: Janith Lima, MD  HPI: 66 year old male followed for asthma and allergies Medical history is significant for high-grade T-cell non-Hodgkin's lymphoma status post [facility] and stem cell transplant  TEST/EVENTS :  Spirometry 03/10/2017  FEV1 3.29 (89%)  Ratio 86 p am Dulera 100 x 2 pffs   Allergy profile 01/31/2021 >  Eos 0. /  IgE      07/02/2021 Follow up: Asthma  Patient presents for a follow-up visit.  He has underlying asthma and allergies.  Patient complains of persistent cough for last 2 months. Seen by PCP treated for Bronchitis with antibiotics initially. No significant change in cough . Cough seems to was and wane. Took steroids for gout few weeks ago with no change in cough . Tried otc cough meds without much help  Some intermittent wheezing . Seems to have started after cold around the Holidays 2022.  No fever, body aches. Hemoptysis or edema.  Cough is dry in nature.  Remains on Xyzal daily . On Dulera Twice daily  . No albuterol use.  Exhaled nitric oxide testing today is 13 ppb.      Allergies  Allergen Reactions   Penicillamine    Penicillins Rash    Immunization History  Administered Date(s) Administered   DTaP / IPV 02/18/2018, 05/11/2018, 09/09/2018   Fluad Quad(high Dose 65+) 01/31/2021   Janssen (J&J) SARS-COV-2 Vaccination 08/11/2019   Pneumococcal Conjugate-13 02/18/2018, 05/11/2018, 09/09/2018   Pneumococcal Polysaccharide-23 05/05/2010, 12/09/2018   Tdap 09/09/2018   Zoster Recombinat (Shingrix) 02/12/2017, 02/07/2019, 04/04/2019    Past Medical History:  Diagnosis Date   Allergic rhinitis    Asthma    slight   Bell's palsy    GERD (gastroesophageal reflux disease)    Gout    Hypercholesteremia    Melanoma ([facility])    Osteoarthritis    Sleep apnea    Resolving   Thrombocytopenia  ([facility])    Tubular adenoma     Tobacco History: Social History   Tobacco Use  Smoking Status Never  Smokeless Tobacco Never   Counseling given: Not Answered   Outpatient Medications Prior to Visit  Medication Sig Dispense Refill   allopurinol (ZYLOPRIM) 300 MG tablet 1 tablet     carvedilol (COREG) 3.125 MG tablet TAKE 1 TABLET BY MOUTH TWICE DAILY WITH A MEAL 180 tablet 3   levocetirizine (XYZAL) 5 MG tablet Take 1 tablet (5 mg total) by mouth every evening. 90 tablet 1   mometasone-formoterol (DULERA) 100-5 MCG/ACT AERO Inhale 2 puffs into the lungs 2 (two) times daily. 13 g 11   Vitamin D, Ergocalciferol, (DRISDOL) 1.25 MG (50000 UNIT) CAPS capsule Take 1 capsule (50,000 Units total) by mouth once a week. 12 capsule 0   No facility-administered medications prior to visit.     Review of Systems:   Constitutional:   No  weight loss, night sweats,  Fevers, chills, fatigue, or  lassitude.  HEENT:   No headaches,  Difficulty swallowing,  Tooth/dental problems, or  Sore throat,                No sneezing, itching, ear ache,  +nasal congestion, post nasal drip,   CV:  No chest pain,  Orthopnea, PND, swelling in lower extremities, anasarca, dizziness, palpitations, syncope.   GI  No heartburn, indigestion, abdominal pain, nausea,  vomiting, diarrhea, change in bowel habits, loss of appetite, bloody stools.   Resp: No shortness of breath with exertion or at rest.  No excess mucus, no productive cough,  No non-productive cough,  No coughing up of blood.  No change in color of mucus.  No wheezing.  No chest wall deformity  Skin: no rash or lesions.  GU: no dysuria, change in color of urine, no urgency or frequency.  No flank pain, no hematuria   MS:  No joint pain or swelling.  No decreased range of motion.  No back pain.    Physical Exam  BP 136/80    Pulse 85    Ht 5\' 10"  (1.778 m)    Wt 212 lb (96.2 kg)    SpO2 98%    BMI 30.42 kg/m   GEN: A/Ox3; pleasant , NAD, well  nourished    HEENT:  [place]/AT,  NOSE-clear, THROAT-clear, no lesions, no postnasal drip or exudate noted.   NECK:  Supple w/ fair ROM; no JVD; normal carotid impulses w/o bruits; no thyromegaly or nodules palpated; no lymphadenopathy.    RESP  Clear  P & A; w/o, wheezes/ rales/ or rhonchi. no accessory muscle use, no dullness to percussion  CARD:  RRR, no m/r/g, no peripheral edema, pulses intact, no cyanosis or clubbing.  GI:   Soft & nt; nml bowel sounds; no organomegaly or masses detected.   Musco: Warm bil, no deformities or joint swelling noted.   Neuro: alert, no focal deficits noted.    Skin: Warm, no lesions or rashes    Lab Results:  CBC    Component Value Date/Time   WBC 4.7 06/28/2021 1129   RBC 4.78 06/28/2021 1129   HGB 15.2 06/28/2021 1129   HGB 10.6 (L) 10/15/2017 0955   HGB 8.6 (L) 05/01/2017 0815   HCT 42.9 06/28/2021 1129   HCT 27.6 (L) 05/01/2017 0815   PLT 100 (L) 06/28/2021 1129   PLT 51 (L) 10/15/2017 0955   PLT 30 (L) 05/01/2017 0815   MCV 89.7 06/28/2021 1129   MCV 87.3 05/01/2017 0815   MCH 31.8 06/28/2021 1129   MCHC 35.4 06/28/2021 1129   RDW 13.2 06/28/2021 1129   RDW 18.4 (H) 05/01/2017 0815   LYMPHSABS 0.8 06/28/2021 1129   LYMPHSABS 1.0 05/01/2017 0815   MONOABS 2.1 (H) 06/28/2021 1129   MONOABS 1.8 (H) 05/01/2017 0815   EOSABS 0.0 06/28/2021 1129   EOSABS 0.0 05/01/2017 0815   BASOSABS 0.1 06/28/2021 1129   BASOSABS 0.3 (H) 05/01/2017 0815    BMET    Component Value Date/Time   NA 138 06/28/2021 1129   NA 137 05/01/2017 0815   K 3.7 06/28/2021 1129   K 3.6 05/01/2017 0815   CL 101 06/28/2021 1129   CO2 29 06/28/2021 1129   CO2 26 05/01/2017 0815   GLUCOSE 196 (H) 06/28/2021 1129   GLUCOSE 223 (H) 05/01/2017 0815   BUN 16 06/28/2021 1129   BUN 5.6 (L) 05/01/2017 0815   CREATININE 0.90 06/28/2021 1129   CREATININE 0.7 05/01/2017 0815   CALCIUM 9.6 06/28/2021 1129   CALCIUM 8.8 05/01/2017 0815   GFRNONAA >60 06/28/2021  1129   GFRAA >60 01/25/2020 1431    BNP No results found for: BNP  ProBNP No results found for: PROBNP  Imaging: No results found.    No flowsheet data found.  No results found for: NITRICOXIDE      Assessment & Plan:   Cough variant asthma vs  UACS Exacerbation with probable post viral cough Exhaled nitric oxide testing is okay. We will check chest x-ray.  Begin cough suppression regimen and trigger prevention.  Short course of prednisone.  Plan  Patient Instructions  Prednisone 20 mg daily for 5 days Delsym 2 teaspoons twice daily for cough as needed Tessalon Perles 3 times daily for cough as needed Continue on Xyzal daily in the morning Begin Chlortab (Chlorpheniramine) 4 mg at bedtime as needed for drainage and tickle in throat Chest x-ray today Continue on Dulera 2 puffs twice daily, rinse after use Begin Pepcid 20 mg at bedtime for 2 weeks then as needed Follow-up in 6 weeks with Dr. Melvyn Novas and as needed Please contact office for sooner follow up if symptoms do not improve or worsen or seek emergency care         Rexene Edison, NP 07/02/2021

## 2021-07-04 NOTE — Progress Notes (Signed)
Spoke with pt and he voices understanding. Pt did not have any further questions.

## 2021-07-04 NOTE — Progress Notes (Signed)
HEMATOLOGY/ONCOLOGY CLINIC NOTE  Date of Service: 06/28/2021   Patient Care Team: Janith Lima, MD as PCP - General (Internal Medicine) Croitoru, Dani Gobble, MD as PCP - Cardiology (Cardiology)  CHIEF COMPLAINTS/PURPOSE OF CONSULTATION:   Follow-up for active surveillance of high-grade T-cell non-Hodgkin's lymphoma  CURRENT THERAPY: post BMT active surveillance  Previous Treatment; R-CHOEP x 6 cycles BEAM -Auto HSCT  HISTORY OF PRESENTING ILLNESS:  See previous note for details  INTERVAL HISTORY:  Joseph Powell is here for follow-up of his T-cell non-Hodgkin's lymphoma.  He notes no acute new symptoms over the last 6 months.  He notes he had his knee replacement and has been doing well after that surgery.  He notes he has been staying reasonably busy with his work but does have enough time for himself.  No fevers no chills no night sweats no unexpected weight loss. No new lumps or bumps. No other acute new focal symptoms.  Labs reviewed from today   MEDICAL HISTORY:  Past Medical History:  Diagnosis Date   Allergic rhinitis    Asthma    slight   Bell's palsy    GERD (gastroesophageal reflux disease)    Gout    Hypercholesteremia    Melanoma (HCC)    Osteoarthritis    Sleep apnea    Resolving   Thrombocytopenia (HCC)    Tubular adenoma     SURGICAL HISTORY: Past Surgical History:  Procedure Laterality Date   IR FLUORO GUIDE PORT INSERTION RIGHT  02/17/2017   IR REMOVAL TUN ACCESS W/ PORT W/O FL MOD SED  01/05/2018   IR US GUIDE VASC ACCESS RIGHT  02/17/2017   KNEE SURGERY  1993   MELANOMA EXCISION     NASAL RECONSTRUCTION     VASECTOMY  1999    SOCIAL HISTORY: Social History   Socioeconomic History   Marital status: Married    Spouse name: Not on file   Number of children: Not on file   Years of education: Not on file   Highest education level: Not on file  Occupational History   Occupation: Risk analyst  Tobacco Use   Smoking status:  Never   Smokeless tobacco: Never  Vaping Use   Vaping Use: Never used  Substance and Sexual Activity   Alcohol use: Yes    Alcohol/week: 5.0 standard drinks    Types: 5 Glasses of wine per week   Drug use: No   Sexual activity: Yes    Partners: Female  Other Topics Concern   Not on file  Social History Narrative   Not on file   Social Determinants of Health   Financial Resource Strain: Not on file  Food Insecurity: Not on file  Transportation Needs: Not on file  Physical Activity: Not on file  Stress: Not on file  Social Connections: Not on file  Intimate Partner Violence: Not on file    FAMILY HISTORY: Family History  Problem Relation Age of Onset   Diabetes Mother    Asthma Mother    Gout Brother    Colon cancer Paternal Uncle    Esophageal cancer Neg Hx    Stomach cancer Neg Hx     ALLERGIES:  is allergic to penicillamine and penicillins.  MEDICATIONS:  Current Outpatient Medications  Medication Sig Dispense Refill   allopurinol (ZYLOPRIM) 300 MG tablet 1 tablet     carvedilol (COREG) 3.125 MG tablet TAKE 1 TABLET BY MOUTH TWICE DAILY WITH A MEAL 180 tablet 3  levocetirizine (XYZAL) 5 MG tablet Take 1 tablet (5 mg total) by mouth every evening. 90 tablet 1   mometasone-formoterol (DULERA) 100-5 MCG/ACT AERO Inhale 2 puffs into the lungs 2 (two) times daily. 13 g 11   Vitamin D, Ergocalciferol, (DRISDOL) 1.25 MG (50000 UNIT) CAPS capsule Take 1 capsule (50,000 Units total) by mouth once a week. 12 capsule 0   benzonatate (TESSALON) 200 MG capsule Take 1 capsule (200 mg total) by mouth 3 (three) times daily as needed for cough. 45 capsule 1   predniSONE (DELTASONE) 20 MG tablet Take 1 tablet (20 mg total) by mouth daily with breakfast. 5 tablet 0   No current facility-administered medications for this visit.    REVIEW OF SYSTEMS:   10 Point review of Systems was done is negative except as noted above.   PHYSICAL EXAMINATION: ECOG PERFORMANCE STATUS: 1 -  Symptomatic but completely ambulatory  Vitals:   06/28/21 1225  BP: 136/84  Pulse: 74  Resp: 20  Temp: 97.7 F (36.5 C)  SpO2: 97%   Filed Weights   06/28/21 1225  Weight: 209 lb 14.4 oz (95.2 kg)   .Body mass index is 30.12 kg/m.  NAD GENERAL:alert, in no acute distress and comfortable SKIN: no acute rashes, no significant lesions EYES: conjunctiva are pink and non-injected, sclera anicteric OROPHARYNX: MMM, no exudates, no oropharyngeal erythema or ulceration NECK: supple, no JVD LYMPH:  no palpable lymphadenopathy in the cervical, axillary or inguinal regions LUNGS: clear to auscultation b/l with normal respiratory effort HEART: regular rate & rhythm ABDOMEN:  normoactive bowel sounds , non tender, not distended. Extremity: no pedal edema PSYCH: alert & oriented x 3 with fluent speech NEURO: no focal motor/sensory deficits   LABORATORY DATA:  I have reviewed the data as listed  . CBC Latest Ref Rng & Units 06/28/2021 01/31/2021 12/28/2020  WBC 4.0 - 10.5 K/uL 4.7 4.7 6.7  Hemoglobin 13.0 - 17.0 g/dL 15.2 14.6 15.4  Hematocrit 39.0 - 52.0 % 42.9 43.1 46.0  Platelets 150 - 400 K/uL 100(L) 81.0(L) 106(L)  .CBC    Component Value Date/Time   WBC 4.7 06/28/2021 1129   RBC 4.78 06/28/2021 1129   HGB 15.2 06/28/2021 1129   HGB 10.6 (L) 10/15/2017 0955   HGB 8.6 (L) 05/01/2017 0815   HCT 42.9 06/28/2021 1129   HCT 27.6 (L) 05/01/2017 0815   PLT 100 (L) 06/28/2021 1129   PLT 51 (L) 10/15/2017 0955   PLT 30 (L) 05/01/2017 0815   MCV 89.7 06/28/2021 1129   MCV 87.3 05/01/2017 0815   MCH 31.8 06/28/2021 1129   MCHC 35.4 06/28/2021 1129   RDW 13.2 06/28/2021 1129   RDW 18.4 (H) 05/01/2017 0815   LYMPHSABS 0.8 06/28/2021 1129   LYMPHSABS 1.0 05/01/2017 0815   MONOABS 2.1 (H) 06/28/2021 1129   MONOABS 1.8 (H) 05/01/2017 0815   EOSABS 0.0 06/28/2021 1129   EOSABS 0.0 05/01/2017 0815   BASOSABS 0.1 06/28/2021 1129   BASOSABS 0.3 (H) 05/01/2017 0815    CMP Latest  Ref Rng & Units 06/28/2021 12/28/2020 06/05/2020  Glucose 70 - 99 mg/dL 196(H) 161(H) 88  BUN 8 - 23 mg/dL 16 11 11   Creatinine 0.61 - 1.24 mg/dL 0.90 1.05 1.14  Sodium 135 - 145 mmol/L 138 140 140  Potassium 3.5 - 5.1 mmol/L 3.7 5.1 4.9  Chloride 98 - 111 mmol/L 101 103 104  CO2 22 - 32 mmol/L 29 28 27   Calcium 8.9 - 10.3 mg/dL 9.6 9.6 9.8  Total Protein 6.5 - 8.1 g/dL 6.5 7.0 6.7  Total Bilirubin 0.3 - 1.2 mg/dL 0.8 0.8 0.8  Alkaline Phos 38 - 126 U/L 88 110 98  AST 15 - 41 U/L 30 24 17   ALT 0 - 44 U/L 23 15 22    . Lab Results  Component Value Date   LDH 140 06/28/2021       06/22/17 Cytogenetics Report:   06/22/17 Flow Cytometry:    06/22/17 BM Bx:    RADIOGRAPHIC STUDIES:  I have personally reviewed the radiological images as listed and agreed with the findings in the report. DG Chest 2 View  Result Date: 07/04/2021 CLINICAL DATA:  Cough EXAM: CHEST - 2 VIEW COMPARISON:  05/27/2019 FINDINGS: Cardiac size is within normal limits. There is peribronchial thickening. There are no signs of pulmonary edema or focal pulmonary consolidation. There is no pleural effusion or pneumothorax. IMPRESSION: Peribronchial thickening suggests bronchitis. There are no new focal infiltrates. Electronically Signed   By: Elmer Picker M.D.   On: 07/04/2021 14:17     11/13/17 PET/CT    ASSESSMENT & PLAN:    Joseph Powell is a 66 y.o. 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 Patient has some chronic thrombocytopenia 85-125k for a few years. Platelets upon initial visit are down to 36k in the setting of newly diagnosed lymphoma and treatment with R-CHOEP and from hypersplenism but then improved. PLt counts had normalized after treatment of the lymphoma pre-transplant Has continued to have some low-grade  thrombocytopenia posttransplant. PLAN: -Patient's labs from today were reviewed CBC shows normal hemoglobin of 15.2, normal WBC count of 4.7k with mild neutropenia of 1100 and chronic thrombocytopenia with platelets of 100k -Mild neutropenia we will monitor.  Could be from his allopurinol and allergies. CMP unremarkable except a blood sugar of 196 LDH within normal limits at 140 No clinical symptoms suggestive of lymphoma recurrence at this time. No lab evidence of lymphoma recurrence at this time.  #3 elevated blood sugar levels/DM2 -Patient notes weight gain and has elevated blood sugar of 196. -We discussed continued follow-up of fasting glucose and hemoglobin A1c with primary care physician. -He notes that he will also be working on his diet and exercise to try to optimize his weight.  #4 History of Melanoma, 2009 -Follow-up with dermatology.   #8 Patient Active Problem List   Diagnosis Date Noted   Type II diabetes mellitus with manifestations ([place]) 03/05/2021   Hyperlipidemia with target LDL less than 100 03/05/2021   Encounter for general adult medical examination with abnormal findings 03/05/2021   Non-seasonal allergic rhinitis due to pollen 03/05/2021   Cough variant asthma vs UACS 01/27/2017   Mild asthma 08/05/2016   DJD (degenerative joint disease) 08/05/2016   HLD (hyperlipidemia) 07/27/2016   GERD (gastroesophageal reflux disease) 07/27/2016   Gout 07/27/2016   OSA (obstructive sleep apnea) 09/19/2013   Thrombocytopenia ([place]) 04/14/2011   SCHATZKI'S RING 08/21/2009  -Continue follow-up with primary to manage other medical issues  FOLLOW UP: Return to clinic with Dr. Lovena Le with labs in 6 months   The total time spent in the appointment was 22 minutes*.  All of the patient's questions were answered with apparent satisfaction. The patient knows to call the clinic with any problems, questions or concerns.   Sullivan Lone MD [place] AAHIVMS [facility]  [facility] Hematology/Oncology Physician [facility]  .*Total Encounter Time as defined by the Centers for Medicare and Medicaid Services includes, in addition to the face-to-face time of a patient visit (documented in the note above) non-face-to-face time: obtaining and reviewing outside history, ordering and reviewing medications, tests or procedures, care coordination (communications with other health care professionals or caregivers) and documentation in the medical record.

## 2021-07-10 ENCOUNTER — Telehealth: Payer: Self-pay

## 2021-07-10 NOTE — Telephone Encounter (Signed)
Call attempt per Dr Irene Limbo. Left pt a message to let patient know that his neutrophil counts are slightly low at 1100 could be from allopurinol or his allergies or URI or other medications.  Reasonable to take B complex if he is not taking that already.  ? ?

## 2021-07-22 ENCOUNTER — Other Ambulatory Visit: Payer: Self-pay

## 2021-07-22 ENCOUNTER — Ambulatory Visit (INDEPENDENT_AMBULATORY_CARE_PROVIDER_SITE_OTHER): Payer: BC Managed Care – PPO | Admitting: Internal Medicine

## 2021-07-22 ENCOUNTER — Encounter: Payer: Self-pay | Admitting: Internal Medicine

## 2021-07-22 VITALS — BP 122/80 | HR 91 | Resp 18 | Ht 70.0 in | Wt 198.6 lb

## 2021-07-22 DIAGNOSIS — R109 Unspecified abdominal pain: Secondary | ICD-10-CM

## 2021-07-22 LAB — COMPREHENSIVE METABOLIC PANEL
ALT: 21 U/L (ref 0–53)
AST: 33 U/L (ref 0–37)
Albumin: 4.2 g/dL (ref 3.5–5.2)
Alkaline Phosphatase: 72 U/L (ref 39–117)
BUN: 11 mg/dL (ref 6–23)
CO2: 28 mEq/L (ref 19–32)
Calcium: 9.4 mg/dL (ref 8.4–10.5)
Chloride: 104 mEq/L (ref 96–112)
Creatinine, Ser: 0.88 mg/dL (ref 0.40–1.50)
GFR: 90.08 mL/min (ref >60.00)
Glucose, Bld: 125 mg/dL — ABNORMAL HIGH (ref 70–99)
Potassium: 4.6 mEq/L (ref 3.5–5.1)
Sodium: 140 mEq/L (ref 135–145)
Total Bilirubin: 0.8 mg/dL (ref 0.2–1.2)
Total Protein: 6.4 g/dL (ref 6.0–8.3)

## 2021-07-22 LAB — CBC
HCT: 41.4 % (ref 39.0–52.0)
Hemoglobin: 14.1 g/dL (ref 13.0–17.0)
MCHC: 34.1 g/dL (ref 30.0–36.0)
MCV: 92.3 fl (ref 78.0–100.0)
Platelets: 131 10*3/uL — ABNORMAL LOW (ref 150.0–400.0)
RBC: 4.49 Mil/uL (ref 4.22–5.81)
RDW: 13.8 % (ref 11.5–15.5)
WBC: 6.8 10*3/uL (ref 4.0–10.5)

## 2021-07-22 NOTE — Patient Instructions (Signed)
We will check the labs today. 

## 2021-07-22 NOTE — Assessment & Plan Note (Signed)
Checking CBC and CMP due to past lymphoma. Suspect pulled muscle due to excessive coughing lately. Advised to use tylenol. Started 1-2 weeks ago and overall improving.  ?

## 2021-07-22 NOTE — Progress Notes (Signed)
? ?  Subjective:  ? ?Patient ID: Joseph Powell, male    DOB: 11-Jun-1955, 66 y.o.   MRN: [id number] ? ?HPI ?The patient is a 66 YO man coming in for abdominal pain. ? ?Review of Systems  ?Constitutional: Negative.   ?HENT: Negative.    ?Eyes: Negative.   ?Respiratory:  Negative for cough, chest tightness and shortness of breath.   ?Cardiovascular:  Negative for chest pain, palpitations and leg swelling.  ?Gastrointestinal:  Positive for abdominal pain. Negative for abdominal distention, anal bleeding, blood in stool, constipation, diarrhea, nausea, rectal pain and vomiting.  ?Musculoskeletal: Negative.   ?Skin: Negative.   ?Neurological: Negative.   ?Psychiatric/Behavioral: Negative.    ? ?Objective:  ?Physical Exam ?Constitutional:   ?   Appearance: He is well-developed.  ?HENT:  ?   Head: Normocephalic and atraumatic.  ?Cardiovascular:  ?   Rate and Rhythm: Normal rate and regular rhythm.  ?Pulmonary:  ?   Effort: Pulmonary effort is normal. No respiratory distress.  ?   Breath sounds: Normal breath sounds. No wheezing or rales.  ?Abdominal:  ?   General: Bowel sounds are normal. There is no distension.  ?   Palpations: Abdomen is soft.  ?   Tenderness: There is abdominal tenderness. There is no rebound.  ?   Comments: Mild upper abdomen tenderness  ?Musculoskeletal:  ?   Cervical back: Normal range of motion.  ?Skin: ?   General: Skin is warm and dry.  ?Neurological:  ?   Mental Status: He is alert and oriented to person, place, and time.  ?   Coordination: Coordination normal.  ? ? ?Vitals:  ? 07/22/21 0942  ?BP: 122/80  ?Pulse: 91  ?Resp: 18  ?SpO2: 100%  ?Weight: 198 lb 9.6 oz (90.1 kg)  ?Height: '5\' 10"'$  (1.778 m)  ? ? ?This visit occurred during the SARS-CoV-2 public health emergency.  Safety protocols were in place, including screening questions prior to the visit, additional usage of staff PPE, and extensive cleaning of exam room while observing appropriate contact time as indicated for disinfecting  solutions.  ? ?Assessment & Plan:  ? ?

## 2021-07-23 ENCOUNTER — Encounter: Payer: Self-pay | Admitting: Internal Medicine

## 2021-08-07 ENCOUNTER — Telehealth: Payer: Self-pay | Admitting: Hematology

## 2021-08-07 NOTE — Telephone Encounter (Signed)
.  Called pt per 4/5 inbasket , Patient was unavailable, a message with appt time and date was left with number on file.   ?

## 2021-08-16 DIAGNOSIS — R053 Chronic cough: Secondary | ICD-10-CM | POA: Diagnosis not present

## 2021-08-16 DIAGNOSIS — C8448 Peripheral T-cell lymphoma, not classified, lymph nodes of multiple sites: Secondary | ICD-10-CM | POA: Diagnosis not present

## 2021-08-16 DIAGNOSIS — D709 Neutropenia, unspecified: Secondary | ICD-10-CM | POA: Diagnosis not present

## 2021-08-16 DIAGNOSIS — Z9484 Stem cells transplant status: Secondary | ICD-10-CM | POA: Diagnosis not present

## 2021-08-19 ENCOUNTER — Encounter: Payer: Self-pay | Admitting: Adult Health

## 2021-08-19 ENCOUNTER — Ambulatory Visit (INDEPENDENT_AMBULATORY_CARE_PROVIDER_SITE_OTHER): Payer: BC Managed Care – PPO | Admitting: Adult Health

## 2021-08-19 DIAGNOSIS — J45991 Cough variant asthma: Secondary | ICD-10-CM

## 2021-08-19 DIAGNOSIS — J301 Allergic rhinitis due to pollen: Secondary | ICD-10-CM | POA: Diagnosis not present

## 2021-08-19 MED ORDER — BENZONATATE 200 MG PO CAPS: 200.0000 mg | ORAL_CAPSULE | Freq: Three times a day (TID) | ORAL | 3 refills | Status: DC | PRN

## 2021-08-19 NOTE — Patient Instructions (Addendum)
Delsym 2 teaspoons twice daily for cough as needed ?Tessalon Perles 3 times daily for cough as needed ?Chlortab (Chlorpheniramine) '4mg'$  every 6hr as needed for drainage /throat clearing .  ?Continue on Dulera as you are taking.  ?Follow-up in 4-6 months with Dr. Melvyn Novas  and As needed   ?Please contact office for sooner follow up if symptoms do not improve or worsen or seek emergency care  ? ?

## 2021-08-19 NOTE — Progress Notes (Signed)
? ?_0  ID: Joseph Powell, male    DOB: 16-Mar-1956, 66 y.o.   MRN: [id number] ? ?Chief Complaint  ?Patient presents with  ? Follow-up  ? ? ?Referring provider: ?Janith Lima, MD ? ?HPI: ?66 year old male followed for asthma and allergies ?Medical history is significant for high-grade T-cell non-Hodgkin's lymphoma status post CHOP and stem cell transplant followed by oncology ? ?TEST/EVENTS :  ?Spirometry 03/10/2017  FEV1 3.29 (89%)  Ratio 86 p am Dulera 100 x 2 pffs  ? Allergy profile 01/31/2021 >  Eos 0. /  IgE   <2 ?  ? ?08/19/21  Follow up : Asthma  ?Patient returns for a 46-monthfollow-up.  Patient has underlying asthma and allergies.  Last visit patient was having ongoing cough and wheezing.  He had been treated by primary care for bronchitis.  He initially took some antibiotics.  He continued to have ongoing symptoms with cough and wheezing.  Exhaled nitric oxide testing last visit was 13 ppb .  Chest x-ray showed peribronchial thickening with no acute process noted.  He was treated with a prednisone burst.  Started on cough control regimen with Delsym and Tessalon.  Recommended to use chlor tabs for postnasal drip.  Since last visit he is feeling better. Cough is substantially decreased.  Still has some postnasal drainage and throat clearing but overall feeling much better. Does not take Dulera every day.  But feels that it is helping some.  Usually takes it about 3 days a week.  Patient feels that chlor tabs worked better than other allergy medicine so usually takes it Chlortab twice a day.  Patient says it does not cause him to be sleepy.  He is taking Tessalon on occasion but is decreased the frequency.  And no longer is needing Delsym. ? ? ?Allergies  ?Allergen Reactions  ? Penicillamine   ? Penicillins Rash  ? ? ?Immunization History  ?Administered Date(s) Administered  ? DTaP / IPV 02/18/2018, 05/11/2018, 09/09/2018  ? Fluad Quad(high Dose 65+) 01/31/2021  ? Hepatitis B, ped/adol 02/18/2018,  05/11/2018, 12/09/2018  ? HiB (PRP-T) 02/18/2018, 05/11/2018, 09/09/2018  ? Influenza Split 04/01/2011  ? Influenza, High Dose Seasonal PF 04/05/2018, 04/07/2019  ? Influenza,inj,quad, With Preservative 02/03/2019  ? Janssen (J&J) SARS-COV-2 Vaccination 08/11/2019  ? MMR 09/09/2019  ? Pneumococcal Conjugate-13 02/18/2018, 05/11/2018, 09/09/2018  ? Pneumococcal Polysaccharide-23 05/05/2010, 12/09/2018  ? Tdap 08/25/2013, 09/09/2018  ? Zoster Recombinat (Shingrix) 02/12/2017, 02/07/2019, 04/04/2019  ? Zoster, Live 01/23/2017, 01/18/2019, 04/04/2019  ? ? ?Past Medical History:  ?Diagnosis Date  ? Allergic rhinitis   ? Asthma   ? slight  ? Bell's palsy   ? GERD (gastroesophageal reflux disease)   ? Gout   ? Hypercholesteremia   ? Melanoma ([place]   ? Osteoarthritis   ? Sleep apnea   ? Resolving  ? [place]   ? Tubular adenoma   ? ? ?Tobacco History: ?Social History  ? ?Tobacco Use  ?Smoking Status Never  ?Smokeless Tobacco Never  ? ?Counseling given: Not Answered ? ? ?Outpatient Medications Prior to Visit  ?Medication Sig Dispense Refill  ? allopurinol (ZYLOPRIM) 300 MG tablet 1 tablet    ? carvedilol (COREG) 3.125 MG tablet TAKE 1 TABLET BY MOUTH TWICE DAILY WITH A MEAL 180 tablet 3  ? levocetirizine (XYZAL) 5 MG tablet Take 1 tablet (5 mg total) by mouth every evening. 90 tablet 1  ? Vitamin D, Ergocalciferol, (DRISDOL) 1.25 MG (50000 UNIT) CAPS capsule Take 1 capsule (50,000 Units total)  by mouth once a week. 12 capsule 0  ? benzonatate (TESSALON) 200 MG capsule Take 1 capsule (200 mg total) by mouth 3 (three) times daily as needed for cough. 45 capsule 1  ? mometasone-formoterol (DULERA) 100-5 MCG/ACT AERO Inhale 2 puffs into the lungs 2 (two) times daily. (Patient not taking: Reported on 08/19/2021) 13 g 11  ? ?No facility-administered medications prior to visit.  ? ? ? ?Review of Systems:  ? ?Constitutional:   No  weight loss, night sweats,  Fevers, chills, fatigue, or  lassitude. ? ?HEENT:   No  headaches,  Difficulty swallowing,  Tooth/dental problems, or  Sore throat,  ?              No sneezing, itching, ear ache,  ?+nasal congestion, post nasal drip,  ? ?CV:  No chest pain,  Orthopnea, PND, swelling in lower extremities, anasarca, dizziness, palpitations, syncope.  ? ?GI  No heartburn, indigestion, abdominal pain, nausea, vomiting, diarrhea, change in bowel habits, loss of appetite, bloody stools.  ? ?Resp: No shortness of breath with exertion or at rest.  No excess mucus, no productive cough,  No non-productive cough,  No coughing up of blood.  No change in color of mucus.  No wheezing.  No chest wall deformity ? ?Skin: no rash or lesions. ? ?GU: no dysuria, change in color of urine, no urgency or frequency.  No flank pain, no hematuria  ? ?MS:  No joint pain or swelling.  No decreased range of motion.  No back pain. ? ? ? ?Physical Exam ? ?BP 130/90 (BP Location: Right Arm, Patient Position: Sitting, Cuff Size: Large)   Pulse 98   Temp 98 ?F (36.7 ?C) (Oral)   Ht _0  (1.803 m)   Wt 201 lb 9.6 oz (91.4 kg)   SpO2 99%   BMI 28.12 kg/m?  ? ?GEN: A/Ox3; pleasant , NAD, well nourished  ?  ?HEENT:  [place]/AT, , NOSE-clear, THROAT-clear, no lesions, no postnasal drip or exudate noted.  ? ?NECK:  Supple w/ fair ROM; no JVD; normal carotid impulses w/o bruits; no thyromegaly or nodules palpated; no lymphadenopathy.   ? ?RESP  Clear  P & A; w/o, wheezes/ rales/ or rhonchi. no accessory muscle use, no dullness to percussion ? ?CARD:  RRR, no m/r/g, no peripheral edema, pulses intact, no cyanosis or clubbing. ? ?GI:   Soft & nt; nml bowel sounds; no organomegaly or masses detected.  ? ?Musco: Warm bil, no deformities or joint swelling noted.  ? ?Neuro: alert, no focal deficits noted.   ? ?Skin: Warm, no lesions or rashes ? ? ? ?Lab Results: ? ? ? ? ? ?BNP ?No results found for: BNP ? ?ProBNP ?No results found for: PROBNP ? ?Imaging: ?No results found. ? ? ? ?   ? View : No data to display.  ?  ?  ?  ? ? ?No  results found for: NITRICOXIDE ? ? ? ? ? ?Assessment & Plan:  ? ?Cough variant asthma vs UACS ?Recent slow to resolve asthmatic bronchitic exacerbation.  Now much improved.  Patient is continue on trigger prevention and cough control ? ?Plan ?Patient Instructions  ?Delsym 2 teaspoons twice daily for cough as needed ?Tessalon Perles 3 times daily for cough as needed ?Chlortab (Chlorpheniramine) 66m every 6hr as needed for drainage /throat clearing .  ?Continue on Dulera as you are taking.  ?Follow-up in 4-6 months with Dr. WMelvyn Novas and As needed   ?Please contact office for  sooner follow up if symptoms do not improve or worsen or seek emergency care  ? ?  ? ? ?Non-seasonal allergic rhinitis due to pollen ?Recent flare.  Now improved.  Patient is continue on current regimen. ? ?Plan  ?Patient Instructions  ?Delsym 2 teaspoons twice daily for cough as needed ?Tessalon Perles 3 times daily for cough as needed ?Chlortab (Chlorpheniramine) 20m every 6hr as needed for drainage /throat clearing .  ?Continue on Dulera as you are taking.  ?Follow-up in 4-6 months with Dr. WMelvyn Novas and As needed   ?Please contact office for sooner follow up if symptoms do not improve or worsen or seek emergency care  ? ?  ? ? ? ? ?TRexene Edison NP ? ? ?

## 2021-08-20 NOTE — Assessment & Plan Note (Signed)
Recent slow to resolve asthmatic bronchitic exacerbation.  Now much improved.  Patient is continue on trigger prevention and cough control ? ?Plan ?Patient Instructions  ?Delsym 2 teaspoons twice daily for cough as needed ?Tessalon Perles 3 times daily for cough as needed ?Chlortab (Chlorpheniramine) '4mg'$  every 6hr as needed for drainage /throat clearing .  ?Continue on Dulera as you are taking.  ?Follow-up in 4-6 months with Dr. Melvyn Novas  and As needed   ?Please contact office for sooner follow up if symptoms do not improve or worsen or seek emergency care  ? ?  ? ?

## 2021-08-20 NOTE — Assessment & Plan Note (Signed)
Recent flare.  Now improved.  Patient is continue on current regimen. ? ?Plan  ?Patient Instructions  ?Delsym 2 teaspoons twice daily for cough as needed ?Tessalon Perles 3 times daily for cough as needed ?Chlortab (Chlorpheniramine) '4mg'$  every 6hr as needed for drainage /throat clearing .  ?Continue on Dulera as you are taking.  ?Follow-up in 4-6 months with Dr. Melvyn Novas  and As needed   ?Please contact office for sooner follow up if symptoms do not improve or worsen or seek emergency care  ? ?  ? ?

## 2021-08-26 DIAGNOSIS — Z9484 Stem cells transplant status: Secondary | ICD-10-CM | POA: Diagnosis not present

## 2021-08-26 DIAGNOSIS — D709 Neutropenia, unspecified: Secondary | ICD-10-CM | POA: Diagnosis not present

## 2021-08-26 DIAGNOSIS — C8448 Peripheral T-cell lymphoma, not classified, lymph nodes of multiple sites: Secondary | ICD-10-CM | POA: Diagnosis not present

## 2021-08-28 ENCOUNTER — Other Ambulatory Visit: Payer: Self-pay | Admitting: Cardiovascular Disease

## 2021-08-28 ENCOUNTER — Other Ambulatory Visit: Payer: Self-pay | Admitting: Hematology

## 2021-08-28 DIAGNOSIS — C8442 Peripheral T-cell lymphoma, not classified, intrathoracic lymph nodes: Secondary | ICD-10-CM

## 2021-10-16 ENCOUNTER — Inpatient Hospital Stay (HOSPITAL_BASED_OUTPATIENT_CLINIC_OR_DEPARTMENT_OTHER)
Admission: EM | Admit: 2021-10-16 | Discharge: 2021-10-24 | DRG: 871 | Disposition: A | Discharge status: Home Health | Payer: BC Managed Care – PPO | Attending: Family Medicine | Admitting: Family Medicine

## 2021-10-16 ENCOUNTER — Emergency Department (HOSPITAL_BASED_OUTPATIENT_CLINIC_OR_DEPARTMENT_OTHER): Payer: BC Managed Care – PPO | Admitting: Radiology

## 2021-10-16 ENCOUNTER — Encounter (HOSPITAL_BASED_OUTPATIENT_CLINIC_OR_DEPARTMENT_OTHER): Payer: Self-pay | Admitting: Obstetrics and Gynecology

## 2021-10-16 ENCOUNTER — Other Ambulatory Visit: Payer: Self-pay

## 2021-10-16 DIAGNOSIS — A492 Hemophilus influenzae infection, unspecified site: Secondary | ICD-10-CM | POA: Diagnosis not present

## 2021-10-16 DIAGNOSIS — Z9484 Stem cells transplant status: Secondary | ICD-10-CM | POA: Diagnosis not present

## 2021-10-16 DIAGNOSIS — Z880 Allergy status to penicillin: Secondary | ICD-10-CM | POA: Diagnosis not present

## 2021-10-16 DIAGNOSIS — K219 Gastro-esophageal reflux disease without esophagitis: Secondary | ICD-10-CM | POA: Diagnosis not present

## 2021-10-16 DIAGNOSIS — B348 Other viral infections of unspecified site: Secondary | ICD-10-CM | POA: Diagnosis not present

## 2021-10-16 DIAGNOSIS — H66003 Acute suppurative otitis media without spontaneous rupture of ear drum, bilateral: Secondary | ICD-10-CM | POA: Diagnosis present

## 2021-10-16 DIAGNOSIS — M47814 Spondylosis without myelopathy or radiculopathy, thoracic region: Secondary | ICD-10-CM | POA: Diagnosis not present

## 2021-10-16 DIAGNOSIS — A4189 Other specified sepsis: Secondary | ICD-10-CM | POA: Diagnosis not present

## 2021-10-16 DIAGNOSIS — H9193 Unspecified hearing loss, bilateral: Secondary | ICD-10-CM | POA: Diagnosis present

## 2021-10-16 DIAGNOSIS — J1008 Influenza due to other identified influenza virus with other specified pneumonia: Secondary | ICD-10-CM | POA: Diagnosis not present

## 2021-10-16 DIAGNOSIS — Z825 Family history of asthma and other chronic lower respiratory diseases: Secondary | ICD-10-CM

## 2021-10-16 DIAGNOSIS — Z85820 Personal history of malignant melanoma of skin: Secondary | ICD-10-CM

## 2021-10-16 DIAGNOSIS — Z794 Long term (current) use of insulin: Secondary | ICD-10-CM

## 2021-10-16 DIAGNOSIS — Z8572 Personal history of non-Hodgkin lymphomas: Secondary | ICD-10-CM

## 2021-10-16 DIAGNOSIS — M109 Gout, unspecified: Secondary | ICD-10-CM | POA: Diagnosis present

## 2021-10-16 DIAGNOSIS — Z6826 Body mass index (BMI) 26.0-26.9, adult: Secondary | ICD-10-CM

## 2021-10-16 DIAGNOSIS — J9601 Acute respiratory failure with hypoxia: Secondary | ICD-10-CM | POA: Diagnosis present

## 2021-10-16 DIAGNOSIS — J122 Parainfluenza virus pneumonia: Secondary | ICD-10-CM | POA: Diagnosis present

## 2021-10-16 DIAGNOSIS — J45991 Cough variant asthma: Secondary | ICD-10-CM | POA: Diagnosis not present

## 2021-10-16 DIAGNOSIS — Z0184 Encounter for antibody response examination: Secondary | ICD-10-CM

## 2021-10-16 DIAGNOSIS — E440 Moderate protein-calorie malnutrition: Secondary | ICD-10-CM | POA: Diagnosis not present

## 2021-10-16 DIAGNOSIS — H918X3 Other specified hearing loss, bilateral: Secondary | ICD-10-CM | POA: Diagnosis not present

## 2021-10-16 DIAGNOSIS — R0602 Shortness of breath: Secondary | ICD-10-CM | POA: Diagnosis not present

## 2021-10-16 DIAGNOSIS — A413 Sepsis due to Hemophilus influenzae: Principal | ICD-10-CM | POA: Diagnosis present

## 2021-10-16 DIAGNOSIS — R21 Rash and other nonspecific skin eruption: Secondary | ICD-10-CM | POA: Diagnosis not present

## 2021-10-16 DIAGNOSIS — J189 Pneumonia, unspecified organism: Principal | ICD-10-CM

## 2021-10-16 DIAGNOSIS — D696 Thrombocytopenia, unspecified: Secondary | ICD-10-CM | POA: Diagnosis present

## 2021-10-16 DIAGNOSIS — E119 Type 2 diabetes mellitus without complications: Secondary | ICD-10-CM | POA: Diagnosis present

## 2021-10-16 DIAGNOSIS — A403 Sepsis due to Streptococcus pneumoniae: Secondary | ICD-10-CM | POA: Diagnosis not present

## 2021-10-16 DIAGNOSIS — J14 Pneumonia due to Hemophilus influenzae: Secondary | ICD-10-CM | POA: Diagnosis present

## 2021-10-16 DIAGNOSIS — Z79899 Other long term (current) drug therapy: Secondary | ICD-10-CM

## 2021-10-16 DIAGNOSIS — E876 Hypokalemia: Secondary | ICD-10-CM | POA: Diagnosis not present

## 2021-10-16 DIAGNOSIS — A419 Sepsis, unspecified organism: Secondary | ICD-10-CM | POA: Diagnosis not present

## 2021-10-16 DIAGNOSIS — Z833 Family history of diabetes mellitus: Secondary | ICD-10-CM

## 2021-10-16 DIAGNOSIS — Z20822 Contact with and (suspected) exposure to covid-19: Secondary | ICD-10-CM | POA: Diagnosis present

## 2021-10-16 DIAGNOSIS — R0603 Acute respiratory distress: Secondary | ICD-10-CM | POA: Diagnosis not present

## 2021-10-16 DIAGNOSIS — R059 Cough, unspecified: Secondary | ICD-10-CM | POA: Diagnosis not present

## 2021-10-16 DIAGNOSIS — R609 Edema, unspecified: Secondary | ICD-10-CM | POA: Diagnosis not present

## 2021-10-16 DIAGNOSIS — I700 Atherosclerosis of aorta: Secondary | ICD-10-CM | POA: Diagnosis not present

## 2021-10-16 DIAGNOSIS — J309 Allergic rhinitis, unspecified: Secondary | ICD-10-CM | POA: Diagnosis present

## 2021-10-16 DIAGNOSIS — G4730 Sleep apnea, unspecified: Secondary | ICD-10-CM | POA: Diagnosis present

## 2021-10-16 DIAGNOSIS — I2510 Atherosclerotic heart disease of native coronary artery without angina pectoris: Secondary | ICD-10-CM | POA: Diagnosis not present

## 2021-10-16 LAB — PROTIME-INR
INR: 1.3 — ABNORMAL HIGH (ref 0.8–1.2)
Prothrombin Time: 15.9 seconds — ABNORMAL HIGH (ref 11.4–15.2)

## 2021-10-16 LAB — COMPREHENSIVE METABOLIC PANEL
ALT: 36 U/L (ref 0–44)
AST: 50 U/L — ABNORMAL HIGH (ref 15–41)
Albumin: 3.6 g/dL (ref 3.5–5.0)
Alkaline Phosphatase: 65 U/L (ref 38–126)
Anion gap: 19 — ABNORMAL HIGH (ref 5–15)
BUN: 9 mg/dL (ref 8–23)
CO2: 22 mmol/L (ref 22–32)
Calcium: 9.5 mg/dL (ref 8.9–10.3)
Chloride: 95 mmol/L — ABNORMAL LOW (ref 98–111)
Creatinine, Ser: 0.63 mg/dL (ref 0.61–1.24)
GFR, Estimated: >60 mL/min (ref >60)
Glucose, Bld: 142 mg/dL — ABNORMAL HIGH (ref 70–99)
Potassium: 3.2 mmol/L — ABNORMAL LOW (ref 3.5–5.1)
Sodium: 136 mmol/L (ref 135–145)
Total Bilirubin: 1 mg/dL (ref 0.3–1.2)
Total Protein: 6.1 g/dL — ABNORMAL LOW (ref 6.5–8.1)

## 2021-10-16 LAB — URINALYSIS, ROUTINE W REFLEX MICROSCOPIC
Bilirubin Urine: NEGATIVE
Glucose, UA: NEGATIVE mg/dL
Ketones, ur: 15 mg/dL — AB
Leukocytes,Ua: NEGATIVE
Nitrite: NEGATIVE
Specific Gravity, Urine: 1.009 (ref 1.005–1.030)
pH: 6.5 (ref 5.0–8.0)

## 2021-10-16 LAB — CBC WITH DIFFERENTIAL/PLATELET
Abs Immature Granulocytes: 1.22 10*3/uL — ABNORMAL HIGH (ref 0.00–0.07)
Basophils Absolute: 0.1 10*3/uL (ref 0.0–0.1)
Basophils Relative: 0 %
Eosinophils Absolute: 0.2 10*3/uL (ref 0.0–0.5)
Eosinophils Relative: 1 %
HCT: 39.8 % (ref 39.0–52.0)
Hemoglobin: 13.5 g/dL (ref 13.0–17.0)
Immature Granulocytes: 4 %
Lymphocytes Relative: 4 %
Lymphs Abs: 1.1 10*3/uL (ref 0.7–4.0)
MCH: 31.3 pg (ref 26.0–34.0)
MCHC: 33.9 g/dL (ref 30.0–36.0)
MCV: 92.1 fL (ref 80.0–100.0)
Monocytes Absolute: 5.9 10*3/uL — ABNORMAL HIGH (ref 0.1–1.0)
Monocytes Relative: 20 %
Neutro Abs: 21.1 10*3/uL — ABNORMAL HIGH (ref 1.7–7.7)
Neutrophils Relative %: 71 %
Platelets: 150 10*3/uL (ref 150–400)
RBC: 4.32 MIL/uL (ref 4.22–5.81)
RDW: 13.8 % (ref 11.5–15.5)
WBC: 29.5 10*3/uL — ABNORMAL HIGH (ref 4.0–10.5)
nRBC: 0 % (ref 0.0–0.2)

## 2021-10-16 LAB — RESPIRATORY PANEL BY PCR

## 2021-10-16 LAB — RESP PANEL BY RT-PCR (FLU A&B, COVID) ARPGX2
Influenza A by PCR: NEGATIVE
Influenza B by PCR: NEGATIVE
SARS Coronavirus 2 by RT PCR: NEGATIVE

## 2021-10-16 LAB — PHOSPHORUS: Phosphorus: 3.1 mg/dL (ref 2.5–4.6)

## 2021-10-16 LAB — APTT: aPTT: 34 seconds (ref 24–36)

## 2021-10-16 LAB — LACTIC ACID, PLASMA
Lactic Acid, Venous: 1.5 mmol/L (ref 0.5–1.9)
Lactic Acid, Venous: 1.5 mmol/L (ref 0.5–1.9)

## 2021-10-16 LAB — MAGNESIUM: Magnesium: 1.7 mg/dL (ref 1.7–2.4)

## 2021-10-16 MED ORDER — SODIUM CHLORIDE 0.9 % IV SOLN
500.0000 mg | Freq: Once | INTRAVENOUS | Status: AC
  Administered 2021-10-16: 500 mg via INTRAVENOUS
  Filled 2021-10-16: qty 5

## 2021-10-16 MED ORDER — LEVOFLOXACIN IN D5W 750 MG/150ML IV SOLN
750.0000 mg | Freq: Once | INTRAVENOUS | Status: DC
  Filled 2021-10-16: qty 150

## 2021-10-16 MED ORDER — CARVEDILOL 3.125 MG PO TABS
3.1250 mg | ORAL_TABLET | Freq: Two times a day (BID) | ORAL | Status: DC
  Administered 2021-10-17 – 2021-10-24 (×16): 3.125 mg via ORAL
  Filled 2021-10-16 (×16): qty 1

## 2021-10-16 MED ORDER — POTASSIUM CHLORIDE CRYS ER 20 MEQ PO TBCR
40.0000 meq | EXTENDED_RELEASE_TABLET | Freq: Once | ORAL | Status: AC
  Administered 2021-10-16: 40 meq via ORAL
  Filled 2021-10-16: qty 2

## 2021-10-16 MED ORDER — MAGNESIUM SULFATE 2 GM/50ML IV SOLN
2.0000 g | Freq: Once | INTRAVENOUS | Status: AC
  Administered 2021-10-16: 2 g via INTRAVENOUS
  Filled 2021-10-16: qty 50

## 2021-10-16 MED ORDER — ALBUTEROL SULFATE (2.5 MG/3ML) 0.083% IN NEBU: 2.5000 mg | INHALATION_SOLUTION | RESPIRATORY_TRACT | Status: DC | PRN

## 2021-10-16 MED ORDER — LACTATED RINGERS IV BOLUS (SEPSIS)
1000.0000 mL | Freq: Once | INTRAVENOUS | Status: AC
  Administered 2021-10-16: 1000 mL via INTRAVENOUS

## 2021-10-16 MED ORDER — POTASSIUM CHLORIDE CRYS ER 20 MEQ PO TBCR
40.0000 meq | EXTENDED_RELEASE_TABLET | Freq: Once | ORAL | Status: AC
Start: 2021-10-16 — End: 2021-10-16
  Administered 2021-10-16: 40 meq via ORAL
  Filled 2021-10-16: qty 2

## 2021-10-16 MED ORDER — IPRATROPIUM-ALBUTEROL 0.5-2.5 (3) MG/3ML IN SOLN
3.0000 mL | Freq: Two times a day (BID) | RESPIRATORY_TRACT | Status: DC
Start: 2021-10-16 — End: 2021-10-24
  Administered 2021-10-16 – 2021-10-24 (×16): 3 mL via RESPIRATORY_TRACT
  Filled 2021-10-16 (×16): qty 3

## 2021-10-16 MED ORDER — ALBUTEROL SULFATE HFA 108 (90 BASE) MCG/ACT IN AERS
INHALATION_SPRAY | RESPIRATORY_TRACT | Status: AC
  Administered 2021-10-16: 2
  Filled 2021-10-16: qty 6.7

## 2021-10-16 MED ORDER — SODIUM CHLORIDE 0.9 % IV SOLN
1.0000 g | Freq: Once | INTRAVENOUS | Status: AC
  Administered 2021-10-16: 1 g via INTRAVENOUS
  Filled 2021-10-16: qty 10

## 2021-10-16 MED ORDER — LACTATED RINGERS IV SOLN: INTRAVENOUS | Status: DC

## 2021-10-16 MED ORDER — LEVOFLOXACIN IN D5W 750 MG/150ML IV SOLN: 750.0000 mg | Freq: Once | INTRAVENOUS | Status: DC

## 2021-10-16 MED ORDER — LACTATED RINGERS IV BOLUS (SEPSIS): 1000.0000 mL | Freq: Once | INTRAVENOUS | Status: DC

## 2021-10-16 NOTE — Sepsis Progress Note (Signed)
ELink tracking the Code Sepsis. 

## 2021-10-16 NOTE — ED Provider Notes (Addendum)
[facility] EMERGENCY DEPT Provider Note   CSN: [id number] Arrival date & time: 10/16/21  3545     History  Chief Complaint  Patient presents with   Shortness of Breath    Joseph Powell is a 66 y.o. male.  Patient just returned from a trip to [place].  On June 1 he started to develop shortness of breath congestion cough no fevers.  Patient diagnosed with lymphoma in 2018.  Had stem cell transplant 2019 has done well since that time.  Followed by oncology at [facility].  Patient was treated twice during this illness with azithromycin first time 3 days next time for 7 days completed azithromycin on June 11.  Just flew back from [place] last evening.  Patient denies any chest pain denies any fever definitely has shortness of breath which has been getting progressive history of asthma but I do not hear any wheezing.  Has had a productive cough.  Temp on arrival 98.8 heart rate 110 respirations 28 blood pressure 155/84 but repeat blood pressures have his blood pressures down to like 625 systolic.  Patient is not a smoker.  Patient denies any nausea vomiting or diarrhea.  Patient not on blood thinners.  Chart review shows patient last seen by hematology oncology at [facility] on August 16, 2021 patient in remission.  Patient arrived with oxygen saturation of 86%.  Patient on high flow nasal cannula oxygen 6 L oxygen levels are remaining in the low 90s.  Past medical history significant for high cholesterol sleep apnea lymphoma melanoma.       Home Medications Prior to Admission medications   Medication Sig Start Date End Date Taking? Authorizing Provider  allopurinol (ZYLOPRIM) 300 MG tablet 1 tablet 06/22/21   [provider]  benzonatate (TESSALON) 200 MG capsule Take 1 capsule (200 mg total) by mouth 3 (three) times daily as needed for cough. 08/19/21 08/19/22  Parrett, Fonnie Mu, NP  carvedilol (COREG) 3.125 MG tablet Take 1 tablet (3.125 mg total) by mouth 2 (two) times  daily with a meal. PATIENT MUST SCHEDULE ANNUAL APPOINTMENT FOR FUTURE REFILLS. FIRST ATTEMPT 08/28/21   Croitoru, Dani Gobble, MD  levocetirizine (XYZAL) 5 MG tablet Take 1 tablet (5 mg total) by mouth every evening. 03/05/21   Janith Lima, MD  mometasone-formoterol (DULERA) 100-5 MCG/ACT AERO Inhale 2 puffs into the lungs 2 (two) times daily. Patient not taking: Reported on 08/19/2021 01/31/21   Tanda Rockers, MD  Vitamin D, Ergocalciferol, (DRISDOL) 1.25 MG (50000 UNIT) CAPS capsule Take 1 capsule by mouth once a week 08/28/21   Brunetta Genera, MD      Allergies    Penicillamine and Penicillins    Review of Systems   Review of Systems  Constitutional:  Negative for chills and fever.  HENT:  Positive for congestion. Negative for ear pain and sore throat.   Eyes:  Negative for pain and visual disturbance.  Respiratory:  Positive for cough and shortness of breath. Negative for wheezing.   Cardiovascular:  Negative for chest pain and palpitations.  Gastrointestinal:  Negative for abdominal pain, diarrhea, nausea and vomiting.  Genitourinary:  Negative for dysuria and hematuria.  Musculoskeletal:  Negative for arthralgias and back pain.  Skin:  Negative for color change and rash.  Neurological:  Negative for seizures and syncope.  All other systems reviewed and are negative.   Physical Exam Updated Vital Signs BP (!) 150/87   Pulse 92   Temp 98.8 F (37.1 C) (Oral)  Resp 19   Ht 1.803 m ('5\' 11"'$ )   Wt 84.8 kg   SpO2 94%   BMI 26.08 kg/m  Physical Exam Vitals and nursing note reviewed.  Constitutional:      General: He is in acute distress.     Appearance: Normal appearance. He is well-developed.  HENT:     Head: Normocephalic and atraumatic.     Mouth/Throat:     Mouth: Mucous membranes are moist.  Eyes:     Extraocular Movements: Extraocular movements intact.     Conjunctiva/sclera: Conjunctivae normal.     Pupils: Pupils are equal, round, and reactive to light.   Cardiovascular:     Rate and Rhythm: Regular rhythm. Tachycardia present.     Heart sounds: No murmur heard. Pulmonary:     Effort: Respiratory distress present.     Breath sounds: Normal breath sounds. No stridor. No wheezing, rhonchi or rales.  Chest:     Chest wall: No tenderness.  Abdominal:     Palpations: Abdomen is soft.     Tenderness: There is no abdominal tenderness.  Musculoskeletal:        General: No swelling.     Cervical back: Normal range of motion and neck supple.     Right lower leg: No edema.     Left lower leg: No edema.  Skin:    General: Skin is warm and dry.     Capillary Refill: Capillary refill takes less than 2 seconds.  Neurological:     General: No focal deficit present.     Mental Status: He is alert.  Psychiatric:        Mood and Affect: Mood normal.     ED Results / Procedures / Treatments   Labs (all labs ordered are listed, but only abnormal results are displayed) Labs Reviewed  COMPREHENSIVE METABOLIC PANEL - Abnormal; Notable for the following components:      Result Value   Potassium 3.2 (*)    Chloride 95 (*)    Glucose, Bld 142 (*)    Total Protein 6.1 (*)    AST 50 (*)    Anion gap 19 (*)    All other components within normal limits  CBC WITH DIFFERENTIAL/PLATELET - Abnormal; Notable for the following components:   WBC 29.5 (*)    Neutro Abs 21.1 (*)    Monocytes Absolute 5.9 (*)    Abs Immature Granulocytes 1.22 (*)    All other components within normal limits  PROTIME-INR - Abnormal; Notable for the following components:   Prothrombin Time 15.9 (*)    INR 1.3 (*)    All other components within normal limits  RESP PANEL BY RT-PCR (FLU A&B, COVID) ARPGX2  CULTURE, BLOOD (ROUTINE X 2)  CULTURE, BLOOD (ROUTINE X 2)  URINE CULTURE  LACTIC ACID, PLASMA  APTT  LACTIC ACID, PLASMA  URINALYSIS, ROUTINE W REFLEX MICROSCOPIC    EKG EKG Interpretation  Date/Time:  Wednesday October 16 2021 09:00:28 EDT Ventricular Rate:   96 PR Interval:  178 QRS Duration: 81 QT Interval:  361 QTC Calculation: 457 R Axis:   -53 Text Interpretation: Sinus rhythm Inferior infarct, old Consider anterior infarct Confirmed by Fredia Sorrow [phone]) on 10/16/2021 9:03:47 AM  Radiology DG Chest Port 1 View  Result Date: 10/16/2021 CLINICAL DATA:  Productive cough and shortness of breath over the last 2 weeks. EXAM: PORTABLE CHEST 1 VIEW COMPARISON:  07/02/2021 FINDINGS: Heart size is normal. Aortic atherosclerosis is noted. There is bronchial thickening  and pulmonary scarring, but with patchy acute infiltrates in the mid and lower lungs bilaterally. No dense consolidation or lobar collapse. No effusion. No abnormal bone finding. IMPRESSION: Background pattern of scattered pulmonary scarring. Superimposed acute bronchial thickening and patchy pulmonary infiltrates in the mid and lower lungs consistent with bronchopneumonia. Electronically Signed   By: Nelson Chimes M.D.   On: 10/16/2021 08:28    Procedures Procedures    Medications Ordered in ED Medications  lactated ringers infusion (has no administration in time range)  lactated ringers bolus 1,000 mL (1,000 mLs Intravenous New Bag/Given 10/16/21 0906)    And  lactated ringers bolus 1,000 mL (has no administration in time range)    And  lactated ringers bolus 1,000 mL (has no administration in time range)  azithromycin (ZITHROMAX) 500 mg in sodium chloride 0.9 % 250 mL IVPB (500 mg Intravenous New Bag/Given 10/16/21 0958)  albuterol (VENTOLIN HFA) 108 (90 Base) MCG/ACT inhaler (2 puffs  Given 10/16/21 0829)  cefTRIAXone (ROCEPHIN) 1 g in sodium chloride 0.9 % 100 mL IVPB (0 g Intravenous Stopped 10/16/21 0952)  potassium chloride SA (KLOR-CON M) CR tablet 40 mEq (40 mEq Oral Given 10/16/21 0931)    ED Course/ Medical Decision Making/ A&P                           Medical Decision Making Amount and/or Complexity of Data Reviewed Labs: ordered. Radiology:  ordered. ECG/medicine tests: ordered.  Risk Prescription drug management. Decision regarding hospitalization.   CRITICAL CARE Performed by: Fredia Sorrow Total critical care time: 60 minutes Critical care time was exclusive of separately billable procedures and treating other patients. Critical care was necessary to treat or prevent imminent or life-threatening deterioration. Critical care was time spent personally by me on the following activities: development of treatment plan with patient and/or surrogate as well as nursing, discussions with consultants, evaluation of patient's response to treatment, examination of patient, obtaining history from patient or surrogate, ordering and performing treatments and interventions, ordering and review of laboratory studies, ordering and review of radiographic studies, pulse oximetry and re-evaluation of patient's condition.  Patient with a significant oxygen need.  Arrived with oxygen sats of 86%.  They stated that with walking he was 82% from the front door to the room sitting down patient did not have increased oxygen saturation when sitting initially started on 2 L nasal cannula oxygen came up to 84% a few minutes with no further improvement increase to 4 L nasal cannula which he increased to 86% respiratory therapy came in and started him on high flow nasal cannula oxygen.  Oxygen sats 90% currently.  Patient still tachypneic but feels much better.  Chest x-ray consistent with a bronchial pneumonia multifocal.  Will check for COVID influenza since patient has already been treated with azithromycin this is outpatient antibiotic failure.  Patient has a history of penicillin allergy.  We will start the sepsis protocol we will give him fluids since his tachycardic tachypneic and blood pressure is dropping some at 170 systolic.  Patient will be treated with IV Levaquin.  Patient will require admission.  Once we get some labs back.  Patient started on  sepsis protocol.  Patient has a history of lymphoma patient's CBC with marked leukocytosis white count 29,000 lactic acid less than 2-second reassuring complete metabolic panel potassium a little low at 3.2 patient given some oral potassium CO2 22 reassuring glucose 142 renal function GFR greater than 60.  Anion gap of 19.  Hemoglobin 13.5.  Differential for the high white count shows a large absolute neutrophil increase.  And lymphocyte percentages 4.  INR 1.3 patient not on any blood thinners blood cultures done.  We will contact hospitalist for admission.  Patient's blood pressure has actually improved from the low 100s and now is above 120.  However we will continue the sepsis protocol because of the history of lymphoma probably is somewhat immunocompromise.  Discussed with hospitalist for admission.  Patient blood pressure looking better.  Actually had ordered Levaquin for him since he has been on azithromycin twice already.  I have a feeling pharmacist may have changed that.  In discussion with Dr. Olevia Bowens admitting hospitalist for [facility] long also recommends that we give him Levaquin.  So I have reordered it we will tell the nurse to make sure that they do not switch it out again.  Hospitalist will admit him to progressive at [facility] if his oxygen situation gets worse I will call him because then patient would need stepdown.  Ordered the [place] states that he can get Levaquin till tomorrow because of the concern for prolonged QT following the Zithromycin.  It appears off to wait to get the Levaquin till tomorrow.  Final Clinical Impression(s) / ED Diagnoses Final diagnoses:  Multifocal pneumonia  Sepsis, due to unspecified organism, unspecified whether acute organ dysfunction present [facility])  Hypokalemia    Rx / DC Orders ED Discharge Orders     None         Fredia Sorrow, MD 10/16/21 0900    Fredia Sorrow, MD 10/16/21 1002    Fredia Sorrow, MD 10/16/21  1025    Fredia Sorrow, MD 10/16/21 1040

## 2021-10-16 NOTE — Progress Notes (Signed)
Plan of Care Note for accepted transfer   Patient: Joseph Powell MRN: [id number]   DOA: 10/16/2021  Facility requesting transfer: [facility]. Requesting Provider: Fredia Sorrow, MD Reason for transfer: Acute respiratory failure with hypoxia. Facility course:   Per Dr. Rogene Houston:  Chief Complaint  Patient presents with   Shortness of Breath    Joseph Powell is a 66 y.o. male.   Patient just returned from a trip to [place].  On June 1 he started to develop shortness of breath congestion cough no fevers.  Patient diagnosed with lymphoma in 2018.  Had stem cell transplant 2019 has done well since that time.  Followed by oncology at [facility].  Patient was treated twice during this illness with azithromycin first time 3 days next time for 7 days completed azithromycin on June 11.  Just flew back from [place] last evening.  Patient denies any chest pain denies any fever definitely has shortness of breath which has been getting progressive history of asthma but I do not hear any wheezing.  Has had a productive cough.   Temp on arrival 98.8 heart rate 110 respirations 28 blood pressure 155/84 but repeat blood pressures have his blood pressures down to like 413 systolic.  Patient is not a smoker.  Patient denies any nausea vomiting or diarrhea.  Patient not on blood thinners.  Chart review shows patient last seen by hematology oncology at [facility] on August 16, 2021 patient in remission.  Patient arrived with oxygen saturation of 86%.  Patient on high flow nasal cannula oxygen 6 L oxygen levels are remaining in the low 90s.   Past medical history significant for high cholesterol sleep apnea lymphoma melanoma.  Supplemental oxygen currently 9 LPM via HFNC, LR bolus 3000 mL, ceftriaxone and levofloxacin.  Vital signs chart: ECG Heart Rate Resp BP Patient Position (if appropriate) SpO2 O2 Device O2 Flow Rate (L/min) FiO2 (%) Weight Who     10/16/21 1011 -- -- -- -- -- -- -- HFNC 9 L/min -- -- AH  10/16/21  1000 -- 92 92 24 Abnormal  158/84 Abnormal  -- 96 % -- -- -- -- AC  10/16/21 0930 -- 92 92 19 150/87 Abnormal  -- 94 % -- -- -- -- AC  10/16/21 0900 -- 96 96 16 138/76 -- 94 % HFNC 10 L/min -- -- [facility]  10/16/21 0831 -- -- -- -- -- -- 91 % HFNC 10 L/min -- -- AH  10/16/21 08:30:29 -- -- -- -- -- -- -- Room Air -- -- -- [facility]  10/16/21 0830 -- 101 Abnormal  100 23 Abnormal  108/70 -- 92 % -- -- -- -- [facility]  10/16/21 0822 -- -- -- -- -- -- -- HFNC 10 L/min -- -- [facility]  10/16/21 0815 -- 108 Abnormal  108 Abnormal  20 108/80 -- 88 % Abnormal  -- -- -- -- [facility]  10/16/21 0804 -- 107 Abnormal  108 Abnormal  15 -- -- 86 % Abnormal  -- -- -- -- [facility]  10/16/21 0800 -- 108 Abnormal  108 Abnormal  20 129/82 -- 83 % Abnormal  -- -- -- -- [facility]  10/16/21 0800 -- -- -- -- -- -- -- -- -- -- 84.8 kg KZ  10/16/21 0759 98.8 F (37.1 C) 110 Abnormal  111 Abnormal  28 Abnormal  155/84 Abnormal  -- 82 % Abnormal   Room Air -- -- -- [facility]  10/16/21 0758 -- -- -- -- -- -- 84 % Abnormal  -- -- -- --  KZ   Lab work: Magnesium [[id number]]   Collected: 10/16/21 0855   Updated: 10/16/21 1039   Specimen Type: Blood    Magnesium 1.7 mg/dL  Phosphorus [[id number]]   Collected: 10/16/21 0855   Updated: 10/16/21 1039   Specimen Type: Blood    Phosphorus 3.1 mg/dL  Lactic acid, plasma [[id number]]   Collected: 10/16/21 0958   Updated: 10/16/21 1038   Specimen Type: Blood   Specimen Source: Vein    Lactic Acid, Venous 1.5 mmol/L  CBC with Differential [[id number]] (Abnormal)   Collected: 10/16/21 0813   Updated: 10/16/21 0957   Specimen Type: Blood   Specimen Source: Vein    WBC 29.5 High  K/uL   RBC 4.32 MIL/uL   Hemoglobin 13.5 g/dL   HCT 39.8 %   MCV 92.1 fL   MCH 31.3 pg   MCHC 33.9 g/dL   RDW 13.8 %   Platelets 150 K/uL   nRBC 0.0 %   Neutrophils Relative % 71 %   Neutro Abs 21.1 High  K/uL   Lymphocytes Relative 4 %   Lymphs Abs 1.1 K/uL   Monocytes Relative 20 %   Monocytes Absolute 5.9 High  K/uL   Eosinophils  Relative 1 %   Eosinophils Absolute 0.2 K/uL   Basophils Relative 0 %   Basophils Absolute 0.1 K/uL   Immature Granulocytes 4 %   Abs Immature Granulocytes 1.22 High  K/uL   Reactive, Benign Lymphocytes PRESENT  Resp Panel by RT-PCR (Flu A&B, Covid) Anterior Nasal Swab [[id number]]   Collected: 10/16/21 0855   Updated: 10/16/21 0942   Specimen Source: Anterior Nasal Swab    SARS Coronavirus 2 by RT PCR NEGATIVE   Influenza A by PCR NEGATIVE   Influenza B by PCR NEGATIVE  APTT [[id number]]   Collected: 10/16/21 0855   Updated: 10/16/21 0940   Specimen Type: Blood   Specimen Source: Vein    aPTT 34 seconds  Protime-INR [[id number]] (Abnormal)   Collected: 10/16/21 0813   Updated: 10/16/21 0923   Specimen Type: Blood   Specimen Source: Vein    Prothrombin Time 15.9 High  seconds   INR 1.3 High   Comprehensive metabolic panel [[id number]] (Abnormal)   Collected: 10/16/21 0813   Updated: 10/16/21 0916   Specimen Type: Blood   Specimen Source: Vein    Sodium 136 mmol/L   Potassium 3.2 Low  mmol/L   Chloride 95 Low  mmol/L   CO2 22 mmol/L   Glucose, Bld 142 High  mg/dL   BUN 9 mg/dL   Creatinine, Ser 0.63 mg/dL   Calcium 9.5 mg/dL   Total Protein 6.1 Low  g/dL   Albumin 3.6 g/dL   AST 50 High  U/L   ALT 36 U/L   Alkaline Phosphatase 65 U/L   Total Bilirubin 1.0 mg/dL   GFR, Estimated >60 mL/min   Anion gap 19 High   Lactic acid, plasma [[id number]]   Collected: 10/16/21 0827   Updated: 10/16/21 0911   Specimen Type: Blood   Specimen Source: Vein    Lactic Acid, Venous 1.5 mmol/L  Culture, blood (Routine x 2) [[id number]]   Collected: 10/16/21 0813   Updated: 10/16/21 0844   Specimen Type: Blood   Specimen Source: Vein   Culture, blood (Routine x 2) [[id number]]   Collected: 10/16/21 0813   Updated: 10/16/21 0843   Specimen Type: Blood   Specimen Source: Vein    EKG: Vent. rate 96 BPM PR interval 178 ms QRS duration 81  ms QT/QTcB 361/457 ms P-R-T axes 46 -53  36 Sinus rhythm Inferior infarct, old Consider anterior infarct  Imaging: CLINICAL DATA:  Productive cough and shortness of breath over the last 2 weeks.   EXAM: PORTABLE CHEST 1 VIEW   COMPARISON:  07/02/2021   FINDINGS: Heart size is normal. Aortic atherosclerosis is noted. There is bronchial thickening and pulmonary scarring, but with patchy acute infiltrates in the mid and lower lungs bilaterally. No dense consolidation or lobar collapse. No effusion. No abnormal bone finding.   IMPRESSION: Background pattern of scattered pulmonary scarring. Superimposed acute bronchial thickening and patchy pulmonary infiltrates in the mid and lower lungs consistent with bronchopneumonia.   Electronically Signed   By: Nelson Chimes M.D.   On: 10/16/2021 08:28  Plan of care: The patient is accepted for admission to Progressive unit, at [facility].  Respiratory pathogen by PCR, magnesium and phosphorus level added.  Magnesium sulfate 2 g IVPB ordered.  Another dose of K-Dur 40 mEq to be given this afternoon ordered as well.  Author: Reubin Milan, MD 10/16/2021  Check [email or web address] for on-call coverage.  Nursing staff, Please call [place] number on Amion as soon as patient's arrival, so appropriate admitting provider can evaluate the pt.

## 2021-10-16 NOTE — ED Triage Notes (Signed)
Patient recently went on a long trip around [place]. Patient reports he slept under a fan and seemed to get a cold from there but it has gotten progressively worse and he feels more ShOB. HX of asthma.  Patient reports he is getting some phlegm up.

## 2021-10-16 NOTE — ED Notes (Signed)
Report given to the Floor RN. 

## 2021-10-16 NOTE — ED Notes (Signed)
Patient's oxygen was 82% walking from the front door to the room and sitting down. Patient did not have increased oxygen saturation when sitting. RN initiated oxygen at 2L [place] and O2 came up to 84%. After a few minutes with no further improvement, patient was increased to 4L  in which he increased to 86%. RT at bedside at this time.

## 2021-10-17 ENCOUNTER — Other Ambulatory Visit (HOSPITAL_COMMUNITY): Payer: BC Managed Care – PPO

## 2021-10-17 ENCOUNTER — Inpatient Hospital Stay (HOSPITAL_COMMUNITY): Payer: BC Managed Care – PPO

## 2021-10-17 ENCOUNTER — Encounter (HOSPITAL_COMMUNITY): Payer: Self-pay | Admitting: Internal Medicine

## 2021-10-17 DIAGNOSIS — R0603 Acute respiratory distress: Secondary | ICD-10-CM

## 2021-10-17 DIAGNOSIS — E876 Hypokalemia: Secondary | ICD-10-CM | POA: Diagnosis present

## 2021-10-17 DIAGNOSIS — E119 Type 2 diabetes mellitus without complications: Secondary | ICD-10-CM

## 2021-10-17 DIAGNOSIS — J45991 Cough variant asthma: Secondary | ICD-10-CM | POA: Diagnosis not present

## 2021-10-17 DIAGNOSIS — J189 Pneumonia, unspecified organism: Secondary | ICD-10-CM | POA: Diagnosis not present

## 2021-10-17 DIAGNOSIS — K219 Gastro-esophageal reflux disease without esophagitis: Secondary | ICD-10-CM

## 2021-10-17 DIAGNOSIS — M109 Gout, unspecified: Secondary | ICD-10-CM

## 2021-10-17 DIAGNOSIS — J9601 Acute respiratory failure with hypoxia: Secondary | ICD-10-CM | POA: Diagnosis not present

## 2021-10-17 LAB — URINE CULTURE: Culture: NO GROWTH

## 2021-10-17 LAB — BLOOD GAS, VENOUS
Acid-Base Excess: 4.4 mmol/L — ABNORMAL HIGH (ref 0.0–2.0)
Bicarbonate: 29.2 mmol/L — ABNORMAL HIGH (ref 20.0–28.0)
O2 Saturation: 52.7 %
Patient temperature: 36.9
pCO2, Ven: 43 mmHg — ABNORMAL LOW (ref 44–60)
pH, Ven: 7.44 — ABNORMAL HIGH (ref 7.25–7.43)
pO2, Ven: 32 mmHg (ref 32–45)

## 2021-10-17 LAB — COMPREHENSIVE METABOLIC PANEL
ALT: 33 U/L (ref 0–44)
AST: 43 U/L — ABNORMAL HIGH (ref 15–41)
Albumin: 2.6 g/dL — ABNORMAL LOW (ref 3.5–5.0)
Alkaline Phosphatase: 55 U/L (ref 38–126)
Anion gap: 10 (ref 5–15)
BUN: 6 mg/dL — ABNORMAL LOW (ref 8–23)
CO2: 26 mmol/L (ref 22–32)
Calcium: 8.1 mg/dL — ABNORMAL LOW (ref 8.9–10.3)
Chloride: 103 mmol/L (ref 98–111)
Creatinine, Ser: 0.55 mg/dL — ABNORMAL LOW (ref 0.61–1.24)
GFR, Estimated: >60 mL/min (ref >60)
Glucose, Bld: 152 mg/dL — ABNORMAL HIGH (ref 70–99)
Potassium: 3.8 mmol/L (ref 3.5–5.1)
Sodium: 139 mmol/L (ref 135–145)
Total Bilirubin: 0.7 mg/dL (ref 0.3–1.2)
Total Protein: 5.2 g/dL — ABNORMAL LOW (ref 6.5–8.1)

## 2021-10-17 LAB — CBC WITH DIFFERENTIAL/PLATELET
Abs Immature Granulocytes: 1.38 10*3/uL — ABNORMAL HIGH (ref 0.00–0.07)
Basophils Absolute: 0.1 10*3/uL (ref 0.0–0.1)
Basophils Relative: 1 %
Eosinophils Absolute: 0 10*3/uL (ref 0.0–0.5)
Eosinophils Relative: 0 %
HCT: 37 % — ABNORMAL LOW (ref 39.0–52.0)
Hemoglobin: 12.1 g/dL — ABNORMAL LOW (ref 13.0–17.0)
Immature Granulocytes: 8 %
Lymphocytes Relative: 5 %
Lymphs Abs: 0.9 10*3/uL (ref 0.7–4.0)
MCH: 31.8 pg (ref 26.0–34.0)
MCHC: 32.7 g/dL (ref 30.0–36.0)
MCV: 97.1 fL (ref 80.0–100.0)
Monocytes Absolute: 3.1 10*3/uL — ABNORMAL HIGH (ref 0.1–1.0)
Monocytes Relative: 18 %
Neutro Abs: 12.2 10*3/uL — ABNORMAL HIGH (ref 1.7–7.7)
Neutrophils Relative %: 68 %
Platelets: 125 10*3/uL — ABNORMAL LOW (ref 150–400)
RBC: 3.81 MIL/uL — ABNORMAL LOW (ref 4.22–5.81)
RDW: 13.9 % (ref 11.5–15.5)
WBC: 17.7 10*3/uL — ABNORMAL HIGH (ref 4.0–10.5)
nRBC: 0 % (ref 0.0–0.2)

## 2021-10-17 LAB — ECHOCARDIOGRAM COMPLETE
AR max vel: 2.71 cm2
AV Peak grad: 8.5 mmHg
Ao pk vel: 1.46 m/s
Area-P 1/2: 3.91 cm2
Height: 71 in
S' Lateral: 2.3 cm
Weight: 2992 oz

## 2021-10-17 LAB — HEMOGLOBIN A1C
Hgb A1c MFr Bld: 6.6 % — ABNORMAL HIGH (ref 4.8–5.6)
Mean Plasma Glucose: 142.72 mg/dL

## 2021-10-17 LAB — MAGNESIUM: Magnesium: 1.9 mg/dL (ref 1.7–2.4)

## 2021-10-17 LAB — EXPECTORATED SPUTUM ASSESSMENT W GRAM STAIN, RFLX TO RESP C

## 2021-10-17 LAB — GLUCOSE, CAPILLARY
Glucose-Capillary: 134 mg/dL — ABNORMAL HIGH (ref 70–99)
Glucose-Capillary: 149 mg/dL — ABNORMAL HIGH (ref 70–99)
Glucose-Capillary: 250 mg/dL — ABNORMAL HIGH (ref 70–99)
Glucose-Capillary: 250 mg/dL — ABNORMAL HIGH (ref 70–99)
Glucose-Capillary: 262 mg/dL — ABNORMAL HIGH (ref 70–99)

## 2021-10-17 LAB — BRAIN NATRIURETIC PEPTIDE: B Natriuretic Peptide: 372 pg/mL — ABNORMAL HIGH (ref 0.0–100.0)

## 2021-10-17 LAB — TROPONIN I (HIGH SENSITIVITY): Troponin I (High Sensitivity): 11 ng/L (ref <18)

## 2021-10-17 LAB — SAR COV2 SEROLOGY (COVID19)AB(IGG),IA: SARS-CoV-2 Ab, IgG: NONREACTIVE

## 2021-10-17 LAB — PROCALCITONIN: Procalcitonin: <0.1 ng/mL

## 2021-10-17 LAB — MRSA NEXT GEN BY PCR, NASAL: MRSA by PCR Next Gen: NOT DETECTED

## 2021-10-17 LAB — STREP PNEUMONIAE URINARY ANTIGEN: Strep Pneumo Urinary Antigen: NEGATIVE

## 2021-10-17 LAB — SEDIMENTATION RATE: Sed Rate: 32 mm/hr — ABNORMAL HIGH (ref 0–16)

## 2021-10-17 LAB — D-DIMER, QUANTITATIVE: D-Dimer, Quant: 2.22 ug/mL-FEU — ABNORMAL HIGH (ref 0.00–0.50)

## 2021-10-17 LAB — C-REACTIVE PROTEIN: CRP: 19.3 mg/dL — ABNORMAL HIGH (ref <1.0)

## 2021-10-17 LAB — HIV ANTIBODY (ROUTINE TESTING W REFLEX): HIV Screen 4th Generation wRfx: NONREACTIVE

## 2021-10-17 MED ORDER — PANTOPRAZOLE SODIUM 40 MG PO TBEC
40.0000 mg | DELAYED_RELEASE_TABLET | Freq: Every day | ORAL | Status: DC
  Administered 2021-10-17 – 2021-10-24 (×8): 40 mg via ORAL
  Filled 2021-10-17 (×8): qty 1

## 2021-10-17 MED ORDER — FUROSEMIDE 10 MG/ML IJ SOLN
20.0000 mg | Freq: Once | INTRAMUSCULAR | Status: AC
  Administered 2021-10-17: 20 mg via INTRAVENOUS
  Filled 2021-10-17: qty 2

## 2021-10-17 MED ORDER — LORATADINE 10 MG PO TABS
10.0000 mg | ORAL_TABLET | Freq: Every day | ORAL | Status: DC
Start: 2021-10-17 — End: 2021-10-24
  Administered 2021-10-17 – 2021-10-24 (×8): 10 mg via ORAL
  Filled 2021-10-17 (×8): qty 1

## 2021-10-17 MED ORDER — IOHEXOL 350 MG/ML SOLN
80.0000 mL | Freq: Once | INTRAVENOUS | Status: AC | PRN
  Administered 2021-10-17: 80 mL via INTRAVENOUS

## 2021-10-17 MED ORDER — FLUTICASONE PROPIONATE 50 MCG/ACT NA SUSP
1.0000 | Freq: Every day | NASAL | Status: DC
  Administered 2021-10-17 – 2021-10-24 (×8): 1 via NASAL
  Filled 2021-10-17: qty 16

## 2021-10-17 MED ORDER — ONDANSETRON HCL 4 MG PO TABS: 4.0000 mg | ORAL_TABLET | Freq: Four times a day (QID) | ORAL | Status: DC | PRN

## 2021-10-17 MED ORDER — POLYETHYLENE GLYCOL 3350 17 G PO PACK
17.0000 g | PACK | Freq: Every day | ORAL | Status: DC | PRN
Start: 2021-10-17 — End: 2021-10-24

## 2021-10-17 MED ORDER — DOXYCYCLINE HYCLATE 100 MG IV SOLR
100.0000 mg | Freq: Two times a day (BID) | INTRAVENOUS | Status: DC
  Administered 2021-10-17 – 2021-10-18 (×3): 100 mg via INTRAVENOUS
  Filled 2021-10-17 (×3): qty 100

## 2021-10-17 MED ORDER — MAGNESIUM SULFATE 2 GM/50ML IV SOLN
2.0000 g | Freq: Once | INTRAVENOUS | Status: AC
  Administered 2021-10-17: 2 g via INTRAVENOUS
  Filled 2021-10-17: qty 50

## 2021-10-17 MED ORDER — INSULIN ASPART 100 UNIT/ML IJ SOLN
0.0000 [IU] | Freq: Three times a day (TID) | INTRAMUSCULAR | Status: DC
  Administered 2021-10-17: 2 [IU] via SUBCUTANEOUS

## 2021-10-17 MED ORDER — HYDROCODONE BIT-HOMATROP MBR 5-1.5 MG/5ML PO SOLN
5.0000 mL | Freq: Every day | ORAL | Status: DC
  Administered 2021-10-17 – 2021-10-18 (×2): 5 mL via ORAL
  Filled 2021-10-17 (×2): qty 5

## 2021-10-17 MED ORDER — METHYLPREDNISOLONE SODIUM SUCC 125 MG IJ SOLR
125.0000 mg | Freq: Four times a day (QID) | INTRAMUSCULAR | Status: DC
  Administered 2021-10-17 – 2021-10-18 (×4): 125 mg via INTRAVENOUS
  Filled 2021-10-17 (×4): qty 2

## 2021-10-17 MED ORDER — SODIUM CHLORIDE 0.9% FLUSH
3.0000 mL | Freq: Two times a day (BID) | INTRAVENOUS | Status: DC
  Administered 2021-10-17 – 2021-10-23 (×11): 3 mL via INTRAVENOUS

## 2021-10-17 MED ORDER — SODIUM CHLORIDE (PF) 0.9 % IJ SOLN
INTRAMUSCULAR | Status: AC
  Administered 2021-10-18: 3 mL via INTRAVENOUS
  Filled 2021-10-17: qty 50

## 2021-10-17 MED ORDER — ENOXAPARIN SODIUM 40 MG/0.4ML IJ SOSY
40.0000 mg | PREFILLED_SYRINGE | INTRAMUSCULAR | Status: DC
  Administered 2021-10-17 – 2021-10-24 (×8): 40 mg via SUBCUTANEOUS
  Filled 2021-10-17 (×8): qty 0.4

## 2021-10-17 MED ORDER — SODIUM CHLORIDE 0.9 % IV SOLN
2.0000 g | INTRAVENOUS | Status: DC
  Filled 2021-10-17: qty 20

## 2021-10-17 MED ORDER — POTASSIUM CHLORIDE CRYS ER 20 MEQ PO TBCR
20.0000 meq | EXTENDED_RELEASE_TABLET | Freq: Once | ORAL | Status: AC
  Administered 2021-10-17: 20 meq via ORAL
  Filled 2021-10-17: qty 1

## 2021-10-17 MED ORDER — INSULIN GLARGINE-YFGN 100 UNIT/ML ~~LOC~~ SOLN
15.0000 [IU] | Freq: Every day | SUBCUTANEOUS | Status: DC
  Administered 2021-10-17 – 2021-10-20 (×4): 15 [IU] via SUBCUTANEOUS
  Filled 2021-10-17 (×5): qty 0.15

## 2021-10-17 MED ORDER — ALLOPURINOL 300 MG PO TABS
300.0000 mg | ORAL_TABLET | Freq: Every day | ORAL | Status: DC
  Administered 2021-10-17 – 2021-10-24 (×8): 300 mg via ORAL
  Filled 2021-10-17 (×8): qty 1

## 2021-10-17 MED ORDER — ACETAMINOPHEN 325 MG PO TABS: 650.0000 mg | ORAL_TABLET | Freq: Four times a day (QID) | ORAL | Status: DC | PRN

## 2021-10-17 MED ORDER — INSULIN ASPART 100 UNIT/ML IJ SOLN
0.0000 [IU] | INTRAMUSCULAR | Status: DC
  Administered 2021-10-17: 5 [IU] via SUBCUTANEOUS
  Administered 2021-10-17: 2 [IU] via SUBCUTANEOUS
  Administered 2021-10-17: 8 [IU] via SUBCUTANEOUS
  Administered 2021-10-18 (×5): 5 [IU] via SUBCUTANEOUS
  Administered 2021-10-18 – 2021-10-19 (×2): 3 [IU] via SUBCUTANEOUS
  Administered 2021-10-19: 2 [IU] via SUBCUTANEOUS
  Administered 2021-10-19 (×3): 3 [IU] via SUBCUTANEOUS
  Administered 2021-10-20: 5 [IU] via SUBCUTANEOUS
  Administered 2021-10-20: 2 [IU] via SUBCUTANEOUS
  Administered 2021-10-20 – 2021-10-21 (×3): 3 [IU] via SUBCUTANEOUS
  Administered 2021-10-21: 11 [IU] via SUBCUTANEOUS
  Administered 2021-10-22 (×3): 5 [IU] via SUBCUTANEOUS
  Administered 2021-10-23: 2 [IU] via SUBCUTANEOUS
  Administered 2021-10-23: 3 [IU] via SUBCUTANEOUS
  Administered 2021-10-23: 5 [IU] via SUBCUTANEOUS
  Administered 2021-10-23: 3 [IU] via SUBCUTANEOUS
  Administered 2021-10-24 (×2): 2 [IU] via SUBCUTANEOUS

## 2021-10-17 MED ORDER — ACETAMINOPHEN 650 MG RE SUPP: 650.0000 mg | Freq: Four times a day (QID) | RECTAL | Status: DC | PRN

## 2021-10-17 MED ORDER — ONDANSETRON HCL 4 MG/2ML IJ SOLN: 4.0000 mg | Freq: Four times a day (QID) | INTRAMUSCULAR | Status: DC | PRN

## 2021-10-17 MED ORDER — SODIUM CHLORIDE 0.9 % IV SOLN
2.0000 g | Freq: Three times a day (TID) | INTRAVENOUS | Status: DC
  Administered 2021-10-17 – 2021-10-18 (×3): 2 g via INTRAVENOUS
  Filled 2021-10-17 (×4): qty 12.5

## 2021-10-17 NOTE — Assessment & Plan Note (Addendum)
   Notable substantial hypoxic respiratory failure requiring increasing amounts of supplemental oxygen  Etiology is felt to be due to suspected multifactorial bacterial pneumonia with superimposed parainfluenza infection  Obtaining VBG  Obtaining BNP echocardiogram in case of superimposed cardiogenic volume overload.  As needed bronchodilator therapy for shortness of breath and wheezing  Treating underlying infection as noted above  Remainder of assessment and plan as above

## 2021-10-17 NOTE — Assessment & Plan Note (Addendum)
   Daily PPI 

## 2021-10-17 NOTE — Assessment & Plan Note (Signed)
·   Replacing with potassium chloride °· Evaluating for concurrent hypomagnesemia  °· Monitoring potassium levels with serial chemistries. ° °

## 2021-10-17 NOTE — Assessment & Plan Note (Signed)
   On exam patient is not felt to be suffering from a concurrent asthma exacerbation at this time  As needed bronchodilator therapy for shortness of breath and wheezing

## 2021-10-17 NOTE — Assessment & Plan Note (Signed)
.   Patient been placed on Accu-Cheks before every meal and nightly with sliding scale insulin . Hemoglobin A1C ordered . Diabetic Diet  

## 2021-10-17 NOTE — Progress Notes (Signed)
Pharmacy Antibiotic Note  Joseph Powell is a 66 y.o. male admitted on 10/16/2021 with sepsis.  Pharmacy has been consulted for cefepime dosing. Pt has been diagnosed with PNA.   Plan: Cefepime 2 gr IV q8h   Height: '5\' 11"'$  (180.3 cm) Weight: 84.8 kg (187 lb) IBW/kg (Calculated) : 75.3  Temp (24hrs), Avg:98.2 F (36.8 C), Min:98.2 F (36.8 C), Max:98.3 F (36.8 C)  Recent Labs  Lab 10/16/21 0813 10/16/21 0827 10/16/21 0958 10/17/21 0507 10/17/21 0736  WBC 29.5*  --   --   --  17.7*  CREATININE 0.63  --   --  0.55*  --   LATICACIDVEN  --  1.5 1.5  --   --     Estimated Creatinine Clearance: 96.7 mL/min (A) (by C-G formula based on SCr of 0.55 mg/dL (L)).    Allergies  Allergen Reactions   Penicillamine    Penicillins Rash    Antimicrobials this admission: 6/14 azithromycin/CTX x 1  6/15 doxycycline >>  6/15 cefepime  >>   Dose adjustments this admission:   Microbiology results: 6/14 BCx:  6/14 UCx:  6/14 Resp panel:  Positive for parainfluenza virus 3  6/14 Sputum:    Thank you for allowing pharmacy to be a part of this patient's care.    Royetta Asal, PharmD, BCPS 10/17/2021 9:03 AM

## 2021-10-17 NOTE — TOC Initial Note (Signed)
Transition of Care [facility]) - Initial/Assessment Note    Patient Details  Name: Joseph Powell MRN: [id number] Date of Birth: 09/28/1955  Transition of Care [facility]) CM/SW Contact:    Dessa Phi, RN Phone Number: 10/17/2021, 3:00 PM  Clinical Narrative:  On HFNC will monitor if Bipap or home 02 needed-[employer] rep following.                 Expected Discharge Plan: Home/Self Care Barriers to Discharge: Continued Medical Work up   Patient Goals and CMS Choice Patient states their goals for this hospitalization and ongoing recovery are:: Home CMS Medicare.gov Compare Post Acute Care list provided to:: Patient Choice offered to / list presented to : Patient  Expected Discharge Plan and Services Expected Discharge Plan: Home/Self Care   Discharge Planning Services: CM Consult   Living arrangements for the past 2 months: Single Family Home                                      Prior Living Arrangements/Services Living arrangements for the past 2 months: Single Family Home   Patient language and need for interpreter reviewed:: Yes Do you feel safe going back to the place where you live?: Yes      Need for Family Participation in Patient Care: Yes (Comment) Care giver support system in place?: Yes (comment)   Criminal Activity/Legal Involvement Pertinent to Current Situation/Hospitalization: No - Comment as needed  Activities of Daily Living Home Assistive Devices/Equipment: None ADL Screening (condition at time of admission) Patient's cognitive ability adequate to safely complete daily activities?: Yes Is the patient deaf or have difficulty hearing?: No Does the patient have difficulty seeing, even when wearing glasses/contacts?: No Does the patient have difficulty concentrating, remembering, or making decisions?: No Patient able to express need for assistance with ADLs?: Yes Does the patient have difficulty dressing or bathing?: No Independently performs  ADLs?: Yes (appropriate for developmental age) Does the patient have difficulty walking or climbing stairs?: No Weakness of Legs: None Weakness of Arms/Hands: None  Permission Sought/Granted Permission sought to share information with : Case Manager Permission granted to share information with : Yes, Verbal Permission Granted  Share Information with NAME: Case Manager           Emotional Assessment Appearance:: Appears stated age Attitude/Demeanor/Rapport: Gracious Affect (typically observed): Accepting Orientation: : Oriented to Self, Oriented to Place, Oriented to  Time, Oriented to Situation Alcohol / Substance Use: Not Applicable Psych Involvement: No (comment)  Admission diagnosis:  Hypokalemia [E87.6] Sepsis due to pneumonia ([place]) [J18.9, A41.9] Multifocal pneumonia [J18.9] Sepsis, due to unspecified organism, unspecified whether acute organ dysfunction present [facility]) [A41.9] Patient Active Problem List   Diagnosis Date Noted   Pneumonia of both lungs due to infectious organism 10/17/2021   Hypokalemia 10/17/2021   Acute respiratory failure with hypoxia ([place]) 10/17/2021   Sepsis due to pneumonia ([place]) 10/16/2021   Abdominal pain 07/22/2021   Type 2 diabetes mellitus without complication, without long-term current use of insulin ([place]) 03/05/2021   Mixed hyperlipidemia 03/05/2021   Encounter for general adult medical examination with abnormal findings 03/05/2021   Non-seasonal allergic rhinitis due to pollen 03/05/2021   Cough variant asthma vs UACS 01/27/2017   Mild asthma 08/05/2016   DJD (degenerative joint disease) 08/05/2016   HLD (hyperlipidemia) 07/27/2016   GERD without esophagitis 07/27/2016   Gout 07/27/2016   OSA (obstructive sleep apnea)  09/19/2013   Thrombocytopenia ([place]) 04/14/2011   SCHATZKI'S RING 08/21/2009   PCP:  Faustino Congress, NP Pharmacy:   [facility] [address] [place] - [address] [place] [place] [address] Phone: [phone] Fax: [phone]     Social Determinants of Health (SDOH) Interventions    Readmission Risk Interventions     No data to display

## 2021-10-17 NOTE — Assessment & Plan Note (Deleted)
   Suspected multifactorial bacterial pneumonia with superimposed parainfluenza infection  Remainder of assessment and plan as above

## 2021-10-17 NOTE — Assessment & Plan Note (Addendum)
   Patient presented with multiple SIRS criteria including substantial leukocytosis tachypnea and tachycardia in the setting of multifocal bilateral pneumonia all concerning for sepsis  Patient is being hydrated aggressively with intravenous isotonic fluids  ER provider initiated intravenous ceftriaxone, azithromycin and levofloxacin.  We will transition patient to combination of ceftriaxone and doxycycline going forward.  Blood cultures obtained  Viral respiratory panel reveals concurrent parainfluenza infection - Likely in combination with bacterial pneumonia.   Legionella and strep pneumo antigens ordered  Sputum cultures ordered.  We will adjust antibiotic coverage based on results of cultures.

## 2021-10-17 NOTE — H&P (Addendum)
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 as well as [facility] oncology, gastroesophageal reflux disease, gout, hyperlipidemia, remote history of melanoma and asthma presenting to [place] with complaints of shortness of breath and cough.  Patient has been traveling in [place] and reports that on approximately June 1 he started to develop cough with associated congestion.  The symptoms were additionally associated with some shortness of breath that seem to be worse with exertion.  The days that followed symptoms persisted.  Patient was treated with a short course of azithromycin for 3 days initially and when symptoms did not improve he took an additional 7 days completing his course on 6/11.  Bedtime patient arrived back in the night states on 6/13 patient reports that his symptoms were continuing to worsen with worsening shortness of breath and cough.  Patient denies any associated fevers, sick contacts nausea or vomiting.  Patient does complain of some associated with generalized weakness and malaise with poor appetite.  Patient symptoms continue to worsen until he eventually presented to [facility] for evaluation on 6/14.  Upon evaluation in the emergency department patient was found to be hypoxic on arrival with oxygen saturations in the mid 80s.  Patient was placed on 4 L of oxygen via nasal cannula.  Chest x-ray was performed revealing patchy pulmonary infiltrates in the mid and lower lungs consistent with bronchopneumonia.  Patient was initiated on intravenous ceftriaxone and azithromycin as  well as 2 L of lactated Ringer solution.  Throughout the emergency department course patient's oxygen requirements increased the patient eventually being transitioned to high flow oxygen delivery.  Hospitalist group was contacted and patient was excepted for transfer to [facility] long hospital progressive care unit for continued medical care.  Review of Systems: Review of Systems  Constitutional:  Positive for malaise/fatigue.  Respiratory:  Positive for cough and shortness of breath.   Neurological:  Positive for weakness.  All other systems reviewed and are negative.    Past Medical History:  Diagnosis Date   Allergic rhinitis    Asthma    slight   Bell's palsy    GERD (gastroesophageal reflux disease)    Gout    Hypercholesteremia    Melanoma ([place])    Osteoarthritis    Sleep apnea    Resolving   Thrombocytopenia ([place])    Tubular adenoma     Past Surgical History:  Procedure Laterality Date   IR FLUORO GUIDE PORT INSERTION RIGHT  02/17/2017   IR REMOVAL TUN ACCESS W/ PORT W/O FL MOD SED  01/05/2018   IR US GUIDE VASC ACCESS RIGHT  02/17/2017   KNEE SURGERY  1993   MELANOMA EXCISION     NASAL RECONSTRUCTION     VASECTOMY  1999    Social History:  reports that he has never smoked. He has been exposed to tobacco smoke. He has never used smokeless tobacco. He reports current alcohol use of about 5.0 standard drinks of alcohol per week. He reports that he does not use drugs.  Allergies  Allergen Reactions   Penicillamine  Penicillins Rash    Family History  Problem Relation Age of Onset   Diabetes Mother    Asthma Mother    Gout Brother    Colon cancer Paternal Uncle    Esophageal cancer Neg Hx    Stomach cancer Neg Hx     Prior to Admission medications   Medication Sig Start Date End Date Taking? Authorizing Provider  allopurinol (ZYLOPRIM) 300 MG tablet 1 tablet 06/22/21  Yes [provider]  benzonatate (TESSALON) 200 MG capsule Take 1 capsule (200 mg  total) by mouth 3 (three) times daily as needed for cough. 08/19/21 08/19/22 Yes Parrett, Tammy S, NP  CALCIUM PO Take by mouth.   Yes [provider]  carvedilol (COREG) 3.125 MG tablet Take 1 tablet (3.125 mg total) by mouth 2 (two) times daily with a meal. PATIENT MUST SCHEDULE ANNUAL APPOINTMENT FOR FUTURE REFILLS. FIRST ATTEMPT 08/28/21  Yes Croitoru, Mihai, MD  chlorpheniramine (CHLOR-TRIMETON) 4 MG tablet Take 4 mg by mouth 2 (two) times daily as needed for allergies.   Yes [provider]  Cyanocobalamin (VITAMIN B-12 PO) Take by mouth.   Yes [provider]  Vitamin D, Ergocalciferol, (DRISDOL) 1.25 MG (50000 UNIT) CAPS capsule Take 1 capsule by mouth once a week 08/28/21  Yes Brunetta Genera, MD  levocetirizine (XYZAL) 5 MG tablet Take 1 tablet (5 mg total) by mouth every evening. Patient not taking: Reported on 10/16/2021 03/05/21   Janith Lima, MD  mometasone-formoterol [facility]) 100-5 MCG/ACT AERO Inhale 2 puffs into the lungs 2 (two) times daily. Patient not taking: Reported on 08/19/2021 01/31/21   Tanda Rockers, MD    Physical Exam:  Vitals:   10/17/21 0630 10/17/21 0806 10/17/21 1015 10/17/21 1038  BP: 139/79     Pulse: 73     Resp: 18     Temp: 98.2 F (36.8 C)     TempSrc: Oral     SpO2: 92% 94% 99% 91%  Weight:      Height:        Constitutional: Lethargic but arousable and oriented x3, mild respiratory distress Skin: no rashes, no lesions, good skin turgor noted. Eyes: Pupils are equally reactive to light.  No evidence of scleral icterus or conjunctival pallor.  ENMT: Moist mucous membranes noted.  Posterior pharynx clear of any exudate or lesions.   Neck: normal, supple, no masses, no thyromegaly.  No evidence of jugular venous distension.   Respiratory: Bibasilar and mid field rales.  Mild intermittent inspiratory and expiratory wheezing noted on exam in all fields.  Increased respiratory effort without accessory muscle use.   Cardiovascular: Regular rate and rhythm, no murmurs / rubs / gallops. No extremity edema. 2+ pedal pulses. No carotid bruits.  Chest:   Nontender without crepitus or deformity.   Back:   Nontender without crepitus or deformity. Abdomen: Abdomen is soft and nontender.  No evidence of intra-abdominal masses.  Positive bowel sounds noted in all quadrants.   Musculoskeletal: No joint deformity upper and lower extremities. Good ROM, no contractures. Normal muscle tone.  Neurologic: CN 2-12 grossly intact. Sensation intact.  Patient moving all 4 extremities spontaneously.  Patient is following all commands.  Patient is responsive to verbal stimuli.   Psychiatric:  Normal mood and appropriate affect.  Patient does seem to possess insight as to his current situation.    Data Reviewed:  I have personally reviewed and interpreted labs, imaging.  Significant findings are:  Chest x-ray personally reviewed revealing  patchy bilateral pulmonary infiltrates a in the mid and lower fields concerning for pneumonia. BC revealing white blood cell count 29.5, hemoglobin 13.5, hematocrit 39.8 platelet count 150.  Chemistry revealing sodium 136, potassium 3.2, chloride 95, BUN 9, creatinine 0.53. COVID-19 PCR testing negative.  EKG: Personally reviewed.  Rhythm is normal sinus rhythm with heart rate of 96 bpm.  No dynamic ST segment changes appreciated.   Assessment and Plan: * Sepsis due to pneumonia [facility]) Patient presented with multiple SIRS criteria including substantial leukocytosis tachypnea and tachycardia in the setting of multifocal bilateral pneumonia all concerning for sepsis Patient is being hydrated aggressively with intravenous isotonic fluids ER provider initiated intravenous ceftriaxone, azithromycin and levofloxacin.  We will transition patient to combination of ceftriaxone and doxycycline going forward. Blood cultures obtained Viral respiratory panel reveals concurrent parainfluenza infection -  Likely in combination with bacterial pneumonia.  Legionella and strep pneumo antigens ordered Sputum cultures ordered. We will adjust antibiotic coverage based on results of cultures.  Acute respiratory failure with hypoxia (HCC) Notable substantial hypoxic respiratory failure requiring increasing amounts of supplemental oxygen Etiology is felt to be due to suspected multifactorial bacterial pneumonia with superimposed parainfluenza infection Obtaining VBG Obtaining BNP echocardiogram in case of superimposed cardiogenic volume overload. As needed bronchodilator therapy for shortness of breath and wheezing Treating underlying infection as noted above Remainder of assessment and plan as above  Pneumonia of both lungs due to infectious organism Please see assessment and plan above  Type 2 diabetes mellitus without complication, without long-term current use of insulin (HCC) Patient been placed on Accu-Cheks before every meal and nightly with sliding scale insulin Hemoglobin A1C ordered Diabetic Diet   Cough variant asthma vs UACS On exam patient is not felt to be suffering from a concurrent asthma exacerbation at this time As needed bronchodilator therapy for shortness of breath and wheezing  Hypokalemia Replacing with potassium chloride Evaluating for concurrent hypomagnesemia  Monitoring potassium levels with serial chemistries.   GERD without esophagitis Daily PPI   Gout Continue home regimen of allopurinol       Code Status:  Full code  code status decision has been confirmed with: Patient Family Communication: Deferred   Consults: None  Severity of Illness:  The appropriate patient status for this patient is INPATIENT. Inpatient status is judged to be reasonable and necessary in order to provide the required intensity of service to ensure the patient's safety. The patient's presenting symptoms, physical exam findings, and initial radiographic and laboratory data  in the context of their chronic comorbidities is felt to place them at high risk for further clinical deterioration. Furthermore, it is not anticipated that the patient will be medically stable for discharge from the hospital within 2 midnights of admission.   * I certify that at the point of admission it is my clinical judgment that the patient will require inpatient hospital care spanning beyond 2 midnights from the point of admission due to high intensity of service, high risk for further deterioration and high frequency of surveillance required.*  Author:  Vernelle Emerald MD  10/17/2021 10:48 AM

## 2021-10-17 NOTE — Progress Notes (Addendum)
PROGRESS NOTE    Joseph Powell  NKN:[id number] DOB: Dec 04, 1955 DOA: 10/16/2021 PCP: Janith Lima, MD   Brief Narrative: 66 year old with past medical history significant for lymphoma diagnosed 2018, had stem cell transplant 2019, has done well since that time.  Followed by oncologist at [facility], last evaluation August 16, 2021, patient is in remission.  Patient present with worsening shortness of breath, and fevers.  He just returned from a trip from [place], 6/13. He developed shortness of breath and congestion, June first.  During these illness he received azithromycin initially for 3 days then subsequently extended to 7 days.  He presents with worsening shortness of breath, cough.    He was found to be hypoxic oxygen saturation 86 on room air, he required up to 9 L high flow oxygen. Chest x ray: Background pattern of scattered pulmonary scaring.  Superimposed acute bronchial thickening and patchy pulmonary infiltrates in the mid and lower lungs consistent with bronchopneumonia.    Assessment & Plan:   Principal Problem:   Sepsis due to pneumonia [facility]) Active Problems:   Pneumonia of both lungs due to infectious organism   Acute respiratory failure with hypoxia ([place])   Type 2 diabetes mellitus without complication, without long-term current use of insulin (HCC)   Cough variant asthma vs UACS   Hypokalemia   GERD without esophagitis   Gout  1-Sepsis secondary to pneumonia: Patient presents with acute hypoxic respiratory failure, leukocytosis, tachypnea,tachycardia/ in setting of PNA, parainfluenza, concern for superimpose bacterial infection.  -Parainfluenza 3 Positive.  -Follow Blood culture.  -Change ceftriaxone to Cefepime to cover for Pseudomonas. Avoid Levaquin due to multiples adverse effect. Continue with Doxycycline.  -Follow sputum culture, legionella antigen, strep pneumonia. MRSA PCR>  -Discussed with Dr Chase Caller, plan to check CT angio rule out PE> he will see  patient in consultation.    Acute hypoxic respiratory failure secondary to pneumonia: Bilateral Pneumonia; Parainfluenza.  Patent requiring HFNC Oxygen currently at 25 L 50 %  CCM consulted.  Continue with nebulizer.  Continue with IV antibiotics, cefepime and Doxycycline.  Elevation BNP-- echo ordered.   Diabetes type 2: SSI.  Start Long acting insulin while on steroids.   Cough variant asthma: continue with nebulizer.   Hypokalemia: Replaced.  Otitis; suspect related to viral infection. Claritin/   Gout: Resume Allopurinol.         Estimated body mass index is 26.08 kg/m as calculated from the following:   Height as of this encounter: '5\' 11"'$  (1.803 m).   Weight as of this encounter: 84.8 kg.   DVT prophylaxis: Lovenox Code Status: Full code Family Communication: Wife at bedside.  Disposition Plan:  Status is: Inpatient Remains inpatient appropriate because: remain inpatient for management of Resp failure.     Consultants:  CCM  Procedures:  ECHO pending  Antimicrobials:  Cefepime, Doxy   Subjective: He report breathing better, more comfortable on HFNC oxygen.  Report productive cough.  Report feeling ear fullness, decreased hearing.    Objective: Vitals:   10/16/21 2030 10/16/21 2202 10/17/21 0159 10/17/21 0630  BP: (!) 160/104 (!) 167/98 (!) 158/92 139/79  Pulse: 90 92 82 73  Resp: (!) '25 16 20 18  '$ Temp:  98.3 F (36.8 C) 98.2 F (36.8 C) 98.2 F (36.8 C)  TempSrc:  Oral Oral Oral  SpO2: 93% 95% 97% 92%  Weight:      Height:  '5\' 11"'$  (1.803 m)      Intake/Output Summary (Last 24 hours) at 10/17/2021  2482 Last data filed at 10/17/2021 0300 Gross per 24 hour  Intake 1926.99 ml  Output --  Net 1926.99 ml   Filed Weights   10/16/21 0800  Weight: 84.8 kg    Examination:  General exam: Appears calm and comfortable  Respiratory system: Normal resp effort, BL crackles.  Cardiovascular system: S1 & S2 heard, RRR.  Gastrointestinal  system: Abdomen is nondistended, soft and nontender. No organomegaly or masses felt. Normal bowel sounds heard. Central nervous system: Alert and oriented. No focal neurological deficits. Extremities: Symmetric 5 x 5 power.    Data Reviewed: I have personally reviewed following labs and imaging studies  CBC: Recent Labs  Lab 10/16/21 0813  WBC 29.5*  NEUTROABS 21.1*  HGB 13.5  HCT 39.8  MCV 92.1  PLT 500   Basic Metabolic Panel: Recent Labs  Lab 10/16/21 0813 10/16/21 0855 10/17/21 0507  NA 136  --  139  K 3.2*  --  3.8  CL 95*  --  103  CO2 22  --  26  GLUCOSE 142*  --  152*  BUN 9  --  6*  CREATININE 0.63  --  0.55*  CALCIUM 9.5  --  8.1*  MG  --  1.7 1.9  PHOS  --  3.1  --    GFR: Estimated Creatinine Clearance: 96.7 mL/min (A) (by C-G formula based on SCr of 0.55 mg/dL (L)). Liver Function Tests: Recent Labs  Lab 10/16/21 0813 10/17/21 0507  AST 50* 43*  ALT 36 33  ALKPHOS 65 55  BILITOT 1.0 0.7  PROT 6.1* 5.2*  ALBUMIN 3.6 2.6*   No results for input(s): "LIPASE", "AMYLASE" in the last 168 hours. No results for input(s): "AMMONIA" in the last 168 hours. Coagulation Profile: Recent Labs  Lab 10/16/21 0813  INR 1.3*   Cardiac Enzymes: No results for input(s): "CKTOTAL", "CKMB", "CKMBINDEX", "TROPONINI" in the last 168 hours. BNP (last 3 results) No results for input(s): "PROBNP" in the last 8760 hours. HbA1C: No results for input(s): "HGBA1C" in the last 72 hours. CBG: No results for input(s): "GLUCAP" in the last 168 hours. Lipid Profile: No results for input(s): "CHOL", "HDL", "LDLCALC", "TRIG", "CHOLHDL", "LDLDIRECT" in the last 72 hours. Thyroid Function Tests: No results for input(s): "TSH", "T4TOTAL", "FREET4", "T3FREE", "THYROIDAB" in the last 72 hours. Anemia Panel: No results for input(s): "VITAMINB12", "FOLATE", "FERRITIN", "TIBC", "IRON", "RETICCTPCT" in the last 72 hours. Sepsis Labs: Recent Labs  Lab 10/16/21 0827  10/16/21 0958  LATICACIDVEN 1.5 1.5    Recent Results (from the past 240 hour(s))  Resp Panel by RT-PCR (Flu A&B, Covid) Anterior Nasal Swab     Status: None   Collection Time: 10/16/21  8:55 AM   Specimen: Anterior Nasal Swab  Result Value Ref Range Status   SARS Coronavirus 2 by RT PCR NEGATIVE NEGATIVE Final    Comment: (NOTE) SARS-CoV-2 target nucleic acids are NOT DETECTED.  The SARS-CoV-2 RNA is generally detectable in upper respiratory specimens during the acute phase of infection. The lowest concentration of SARS-CoV-2 viral copies this assay can detect is 138 copies/mL. A negative result does not preclude SARS-Cov-2 infection and should not be used as the sole basis for treatment or other patient management decisions. A negative result may occur with  improper specimen collection/handling, submission of specimen other than nasopharyngeal swab, presence of viral mutation(s) within the areas targeted by this assay, and inadequate number of viral copies(<138 copies/mL). A negative result must be combined with clinical observations, patient  history, and epidemiological information. The expected result is Negative.  Fact Sheet for Patients:  EntrepreneurPulse.com.au  Fact Sheet for Healthcare Providers:  IncredibleEmployment.be  This test is no t yet approved or cleared by the [place] FDA and  has been authorized for detection and/or diagnosis of SARS-CoV-2 by FDA under an Emergency Use Authorization (EUA). This EUA will remain  in effect (meaning this test can be used) for the duration of the COVID-19 declaration under Section 564(b)(1) of the Act, 21 U.S.C.section 360bbb-3(b)(1), unless the authorization is terminated  or revoked sooner.       Influenza A by PCR NEGATIVE NEGATIVE Final   Influenza B by PCR NEGATIVE NEGATIVE Final    Comment: (NOTE) The Xpert Xpress SARS-CoV-2/FLU/RSV plus assay is intended as an aid in the  diagnosis of influenza from Nasopharyngeal swab specimens and should not be used as a sole basis for treatment. Nasal washings and aspirates are unacceptable for Xpert Xpress SARS-CoV-2/FLU/RSV testing.  Fact Sheet for Patients: EntrepreneurPulse.com.au  Fact Sheet for Healthcare Providers: IncredibleEmployment.be  This test is not yet approved or cleared by the [place] FDA and has been authorized for detection and/or diagnosis of SARS-CoV-2 by FDA under an Emergency Use Authorization (EUA). This EUA will remain in effect (meaning this test can be used) for the duration of the COVID-19 declaration under Section 564(b)(1) of the Act, 21 U.S.C. section 360bbb-3(b)(1), unless the authorization is terminated or revoked.  Performed at [facility], [address] [place], [place] [address]   Respiratory (~20 pathogens) panel by PCR     Status: Abnormal   Collection Time: 10/16/21 10:50 AM   Specimen: Nasopharyngeal Swab; Respiratory  Result Value Ref Range Status   Adenovirus NOT DETECTED NOT DETECTED Final   Coronavirus 229E NOT DETECTED NOT DETECTED Final    Comment: (NOTE) The Coronavirus on the Respiratory Panel, DOES NOT test for the novel  Coronavirus (2019 nCoV)    Coronavirus HKU1 NOT DETECTED NOT DETECTED Final   Coronavirus NL63 NOT DETECTED NOT DETECTED Final   Coronavirus OC43 NOT DETECTED NOT DETECTED Final   Metapneumovirus NOT DETECTED NOT DETECTED Final   Rhinovirus / Enterovirus NOT DETECTED NOT DETECTED Final   Influenza A NOT DETECTED NOT DETECTED Final   Influenza B NOT DETECTED NOT DETECTED Final   Parainfluenza Virus 1 NOT DETECTED NOT DETECTED Final   Parainfluenza Virus 2 NOT DETECTED NOT DETECTED Final   Parainfluenza Virus 3 DETECTED (A) NOT DETECTED Final   Parainfluenza Virus 4 NOT DETECTED NOT DETECTED Final   Respiratory Syncytial Virus NOT DETECTED NOT DETECTED Final   Bordetella  pertussis NOT DETECTED NOT DETECTED Final   Bordetella Parapertussis NOT DETECTED NOT DETECTED Final   Chlamydophila pneumoniae NOT DETECTED NOT DETECTED Final   Mycoplasma pneumoniae NOT DETECTED NOT DETECTED Final    Comment: Performed at [facility], [address], [place], [place] [address]         Radiology Studies: DG Chest Port 1 View  Result Date: 10/16/2021 CLINICAL DATA:  Productive cough and shortness of breath over the last 2 weeks. EXAM: PORTABLE CHEST 1 VIEW COMPARISON:  07/02/2021 FINDINGS: Heart size is normal. Aortic atherosclerosis is noted. There is bronchial thickening and pulmonary scarring, but with patchy acute infiltrates in the mid and lower lungs bilaterally. No dense consolidation or lobar collapse. No effusion. No abnormal bone finding. IMPRESSION: Background pattern of scattered pulmonary scarring. Superimposed acute bronchial thickening and patchy pulmonary infiltrates in the mid and lower  lungs consistent with bronchopneumonia. Electronically Signed   By: Nelson Chimes M.D.   On: 10/16/2021 08:28        Scheduled Meds:  allopurinol  300 mg Oral Daily   carvedilol  3.125 mg Oral BID WC   enoxaparin (LOVENOX) injection  40 mg Subcutaneous Q24H   insulin aspart  0-15 Units Subcutaneous TID AC & HS   ipratropium-albuterol  3 mL Nebulization BID   sodium chloride flush  3 mL Intravenous Q12H   Continuous Infusions:  cefTRIAXone (ROCEPHIN)  IV     doxycycline (VIBRAMYCIN) IV       LOS: 1 day    Time spent: 35 minutes.     Elmarie Shiley, MD [facility]   If 7PM-7AM, please contact night-coverage [email or web address]  10/17/2021, 6:49 AM

## 2021-10-17 NOTE — Assessment & Plan Note (Signed)
   Continue home regimen of allopurinol

## 2021-10-17 NOTE — Plan of Care (Signed)
  Problem: Coping: Goal: Level of anxiety will decrease Outcome: Progressing   Problem: Elimination: Goal: Will not experience complications related to bowel motility Outcome: Progressing Goal: Will not experience complications related to urinary retention Outcome: Progressing   Problem: Safety: Goal: Ability to remain free from injury will improve Outcome: Progressing   

## 2021-10-17 NOTE — Assessment & Plan Note (Signed)
·   Please see assessment and plan above °

## 2021-10-17 NOTE — Consult Note (Addendum)
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  He follows with Dr. Christinia Gully for chronic cough asthma and allergies.  2018 had normal spirometry and allergy profile in September 2022 with normal IgE and no eosinophils in 2023 exhaled nitric oxide was normal.  Most recent chest x-ray was in April 2023 with peribronchial thickening but no infiltrates [in 2019 did have a CT scan of the chest that had a left lower lobe infiltrate] most recent pulmonary clinic follow-up April 2023 showed improvement in cough with short course prednisone.  Consider this cough variant asthma  Now in the middle of May 2023 he went on a remote close and also travel to [place] in [place] in [place].  1 week into this day because of lack of air conditioning a left is with the room open and had a fan blowing into his face.  Shortly after that he and his wife got sick.  This was around May 27/28 2023.  Then on October 03, 2021 he was evaluated by a physician and then again also on October 08, 2021.  At no point his pulse ox was checked.  He was given azithromycin.  His wife recovered with the same antibiotic but [place]ya starting her on October 08, 2021 was actually getting short of breath.  He arrived back in [place] on November 01, 2021.  By this time he felt like he was hypoxemic and also short of breath with minimal exertion and having significant cough.  Presented to droppage emergency room 10/16/2021 found to be acute hypoxemic respiratory failure.  Had bilateral lower lobe pulmonary infiltrates.  White count was elevated.  Started on Antibiotics but procalcitonin normal.   Also given fluids.  If progressive worsening hypoxemia in the emergency department admitted to progressive unit at [facility].  Pulmonary critical care consulted morning of 10/17/2021 requiring 25 L high flow nasal cannula.  Desaturations with cough noted.  Some associated malaise and poor appetite.  No chest pains.  The cough is dry.  No edema no hemoptysis.  CT angiogram dated, troponin, D-dimer, echo data not available.  No urine strep or Legionella.  He is positive for parainfluenza virus 3.   Past Medical History:    has a past medical history of Allergic rhinitis, Asthma, Bell's palsy, GERD (gastroesophageal reflux disease), Gout, Hypercholesteremia, Melanoma ([place]), Osteoarthritis, Sleep apnea, Thrombocytopenia ([place]), and Tubular adenoma.   reports that he has never smoked. He has been exposed to tobacco smoke. He has never used smokeless tobacco.  Past Surgical History:  Procedure Laterality Date   IR FLUORO GUIDE PORT INSERTION RIGHT  02/17/2017   IR REMOVAL TUN ACCESS W/ PORT W/O FL MOD SED  01/05/2018   IR US GUIDE VASC ACCESS RIGHT  02/17/2017   KNEE SURGERY  1993   MELANOMA EXCISION     NASAL RECONSTRUCTION     VASECTOMY  1999    Allergies  Allergen Reactions   Penicillamine    Penicillins Rash    Immunization History  Administered Date(s) Administered   DTaP / IPV 02/18/2018, 05/11/2018, 09/09/2018   Fluad Quad(high Dose 65+) 01/31/2021  Hepatitis B, ped/adol 02/18/2018, 05/11/2018, 12/09/2018   HiB (PRP-T) 02/18/2018, 05/11/2018, 09/09/2018   Influenza Split 04/01/2011   Influenza, High Dose Seasonal PF 04/05/2018, 04/07/2019   Influenza,inj,quad, With Preservative 02/03/2019   Janssen (J&J) SARS-COV-2 Vaccination 08/11/2019   MMR 09/09/2019   Pneumococcal Conjugate-13 02/18/2018, 05/11/2018, 09/09/2018   Pneumococcal Polysaccharide-23 05/05/2010, 12/09/2018   Tdap 08/25/2013, 09/09/2018   Zoster Recombinat (Shingrix) 02/12/2017, 02/07/2019, 04/04/2019    Zoster, Live 01/23/2017, 01/18/2019, 04/04/2019    Family History  Problem Relation Age of Onset   Diabetes Mother    Asthma Mother    Gout Brother    Colon cancer Paternal Uncle    Esophageal cancer Neg Hx    Stomach cancer Neg Hx      Current Facility-Administered Medications:    acetaminophen (TYLENOL) tablet 650 mg, 650 mg, Oral, Q6H PRN **OR** acetaminophen (TYLENOL) suppository 650 mg, 650 mg, Rectal, Q6H PRN, Shalhoub, Sherryll Burger, MD   albuterol (PROVENTIL) (2.5 MG/3ML) 0.083% nebulizer solution 2.5 mg, 2.5 mg, Nebulization, Q4H PRN, Shalhoub, Sherryll Burger, MD   allopurinol (ZYLOPRIM) tablet 300 mg, 300 mg, Oral, Daily, Shalhoub, Sherryll Burger, MD   carvedilol (COREG) tablet 3.125 mg, 3.125 mg, Oral, BID WC, Shalhoub, Sherryll Burger, MD, 3.125 mg at 10/17/21 0830   ceFEPIme (MAXIPIME) 2 g in sodium chloride 0.9 % 100 mL IVPB, 2 g, Intravenous, Q8H, Regalado, Belkys A, MD   doxycycline (VIBRAMYCIN) 100 mg in sodium chloride 0.9 % 250 mL IVPB, 100 mg, Intravenous, Q12H, Shalhoub, Sherryll Burger, MD   enoxaparin (LOVENOX) injection 40 mg, 40 mg, Subcutaneous, Q24H, Shalhoub, Sherryll Burger, MD   fluticasone (FLONASE) 50 MCG/ACT nasal spray 1 spray, 1 spray, Each Nare, Daily, Regalado, Belkys A, MD   insulin aspart (novoLOG) injection 0-15 Units, 0-15 Units, Subcutaneous, TID AC & HS, Shalhoub, Sherryll Burger, MD, 2 Units at 10/17/21 0828   ipratropium-albuterol (DUONEB) 0.5-2.5 (3) MG/3ML nebulizer solution 3 mL, 3 mL, Nebulization, BID, Shalhoub, Sherryll Burger, MD, 3 mL at 10/17/21 0806   loratadine (CLARITIN) tablet 10 mg, 10 mg, Oral, Daily, Regalado, Belkys A, MD   ondansetron (ZOFRAN) tablet 4 mg, 4 mg, Oral, Q6H PRN **OR** ondansetron (ZOFRAN) injection 4 mg, 4 mg, Intravenous, Q6H PRN, Shalhoub, Sherryll Burger, MD   polyethylene glycol (MIRALAX / GLYCOLAX) packet 17 g, 17 g, Oral, Daily PRN, Shalhoub, Sherryll Burger, MD   sodium chloride flush (NS) 0.9 % injection 3 mL, 3 mL, Intravenous, Q12H, Shalhoub, Sherryll Burger,  MD     Significant Hospital Events:  10/16/2021 - admit   Objective   Blood pressure 139/79, pulse 73, temperature 98.2 F (36.8 C), temperature source Oral, resp. rate 18, height $RemoveBe'5\' 11"'zSsgOetTE$  (1.803 m), weight 84.8 kg, SpO2 94 %.    FiO2 (%):  [45 %-50 %] 50 %   Intake/Output Summary (Last 24 hours) at 10/17/2021 0923 Last data filed at 10/17/2021 0800 Gross per 24 hour  Intake 2046.99 ml  Output --  Net 2046.99 ml   Filed Weights   10/16/21 0800  Weight: 84.8 kg    Examination: General: Well-built male lying in the bed 45 degrees.  Today is his birthday. HENT: High flow nasal cannula oxygen on.Marland Kitchen  He coughs. Lungs: No apparent increased work of breathing but he is mildly tachypneic without paradoxical respirations.  Mild crackles present. Cardiovascular: Regular rate and rhythm normal heart sounds Abdomen: Soft nontender no organomegaly Extremities: No sinus no clubbing no edema Neuro: Alert and oriented x3.  Moves all 4 extremities no rash. GU:  Normal exam  Resolved Hospital Problem list   X  Assessment & Plan:  ASSESSMENT / PLAN:  PULMONARY  A:  Has history of chronic cough secondary to upper airway cough syndrome [.  Chest x-ray in April 2023 but no prior CT scan chest]  -No known ILD   Acute hypoxic respiratory failure with associated pulmonary infiltrates due to parainfluenza virus 3 -requiring 25 L nasal cannula high flow - Present on Admit  Severe cough and desaturation secondary to the above   10/17/2021 -> 25 L high flow.  Clinically meets new 2023 [place] criteria for acute lung injury [ARDS is if there are greater than 30 L of oxygen].  Has high BNP.  Very low evidence of bacterial process [procalcitonin less than 0.1]  P:   Rule out pulmonary embolism -get CT angiogram chest Rule out heart failure -get urgent echo and troponin Rule out secondary bacterial infection - Check urine strep and urine Legionella   Lasix x1  Start early BiPAP 2 hours cycles  x2 in the daytime and nightly  Start IV steroids  -Check ESR, QuantiFERON gold, ANA, rheumatoid factor  -Also check COVID IgG   - ssi insulin   Cough suppression with hycodane  Antibiotics according to the hospitalist -Stop antibiotics if urine strep OR urine Legionella fine or blood culture negative or no evidence of secondary bacterial infection  Pusle ox goal > 92%   Move to ICU if worse  Best practice (daily eval):  According to the hospitalist  Goals of Care:   Multi-Disciplinary Goals of Care Discussion    Family Updates: Full code.  He and his wife updated at the bedside.  Also spoke to the hospitalist.  Anticipated recovery can be days to weeks.     ATTESTATION & SIGNATURE   The patient KARO ROG is critically ill with multiple organ systems failure and requires high complexity decision making for assessment and support, frequent evaluation and titration of therapies, application of advanced monitoring technologies and extensive interpretation of multiple databases.   Critical Care Time devoted to patient care services described in this note is  60  Minutes. This time reflects time of care of this signee Dr Brand Males. This critical care time does not reflect procedure time, or teaching time or supervisory time of PA/NP/Med student/Med Resident etc but could involve care discussion time      SIGNATURE    Dr. Brand Males, M.D., F.C.C.P,  Pulmonary and Critical Care Medicine Staff Physician, [facility] Director - Interstitial Lung Disease  Program  Pulmonary [place] at [place], [place], [address]  NPI Number:  NPI #[id number]  Pager: [phone], If no answer  -> Check AMION or Try [phone] Telephone (clinical office): [phone] Telephone (research): [phone]  9:23 AM 10/17/2021   10/17/2021 9:23 AM    LABS    PULMONARY Recent Labs  Lab 10/17/21 0539   HCO3 29.2*  O2SAT 52.7    CBC Recent Labs  Lab 10/16/21 0813 10/17/21 0736  HGB 13.5 12.1*  HCT 39.8 37.0*  WBC 29.5* 17.7*  PLT 150 125*    COAGULATION Recent Labs  Lab 10/16/21 0813  INR 1.3*    CARDIAC  No results for input(s): "TROPONINI" in the last 168 hours. No results for input(s): "PROBNP" in the last 168 hours.  CHEMISTRY Recent Labs  Lab 10/16/21 0813 10/16/21 0855 10/17/21 0507  NA 136  --  139  K 3.2*  --  3.8  CL 95*  --  103  CO2 22  --  26  GLUCOSE 142*  --  152*  BUN 9  --  6*  CREATININE 0.63  --  0.55*  CALCIUM 9.5  --  8.1*  MG  --  1.7 1.9  PHOS  --  3.1  --    Estimated Creatinine Clearance: 96.7 mL/min (A) (by C-G formula based on SCr of 0.55 mg/dL (L)).   LIVER Recent Labs  Lab 10/16/21 0813 10/17/21 0507  AST 50* 43*  ALT 36 33  ALKPHOS 65 55  BILITOT 1.0 0.7  PROT 6.1* 5.2*  ALBUMIN 3.6 2.6*  INR 1.3*  --      INFECTIOUS Recent Labs  Lab 10/16/21 0827 10/16/21 0958 10/17/21 0507  LATICACIDVEN 1.5 1.5  --   PROCALCITON  --   --  <0.10     ENDOCRINE CBG (last 3)  Recent Labs    10/17/21 0746  GLUCAP 134*         IMAGING x48h  - image(s) personally visualized  -   highlighted in bold DG Chest Port 1 View  Result Date: 10/16/2021 CLINICAL DATA:  Productive cough and shortness of breath over the last 2 weeks. EXAM: PORTABLE CHEST 1 VIEW COMPARISON:  07/02/2021 FINDINGS: Heart size is normal. Aortic atherosclerosis is noted. There is bronchial thickening and pulmonary scarring, but with patchy acute infiltrates in the mid and lower lungs bilaterally. No dense consolidation or lobar collapse. No effusion. No abnormal bone finding. IMPRESSION: Background pattern of scattered pulmonary scarring. Superimposed acute bronchial thickening and patchy pulmonary infiltrates in the mid and lower lungs consistent with bronchopneumonia. Electronically Signed   By: Nelson Chimes M.D.   On: 10/16/2021 08:28

## 2021-10-18 ENCOUNTER — Inpatient Hospital Stay (HOSPITAL_COMMUNITY): Payer: BC Managed Care – PPO

## 2021-10-18 DIAGNOSIS — E440 Moderate protein-calorie malnutrition: Secondary | ICD-10-CM | POA: Insufficient documentation

## 2021-10-18 DIAGNOSIS — R609 Edema, unspecified: Secondary | ICD-10-CM

## 2021-10-18 DIAGNOSIS — J189 Pneumonia, unspecified organism: Secondary | ICD-10-CM | POA: Diagnosis not present

## 2021-10-18 DIAGNOSIS — J9601 Acute respiratory failure with hypoxia: Secondary | ICD-10-CM | POA: Diagnosis not present

## 2021-10-18 DIAGNOSIS — A419 Sepsis, unspecified organism: Secondary | ICD-10-CM | POA: Diagnosis not present

## 2021-10-18 LAB — BASIC METABOLIC PANEL
Anion gap: 10 (ref 5–15)
BUN: 10 mg/dL (ref 8–23)
CO2: 29 mmol/L (ref 22–32)
Calcium: 8.8 mg/dL — ABNORMAL LOW (ref 8.9–10.3)
Chloride: 102 mmol/L (ref 98–111)
Creatinine, Ser: 0.66 mg/dL (ref 0.61–1.24)
GFR, Estimated: >60 mL/min (ref >60)
Glucose, Bld: 229 mg/dL — ABNORMAL HIGH (ref 70–99)
Potassium: 3.4 mmol/L — ABNORMAL LOW (ref 3.5–5.1)
Sodium: 141 mmol/L (ref 135–145)

## 2021-10-18 LAB — BLOOD CULTURE ID PANEL (REFLEXED) - BCID2

## 2021-10-18 LAB — CBC WITH DIFFERENTIAL/PLATELET
Abs Immature Granulocytes: 1.52 10*3/uL — ABNORMAL HIGH (ref 0.00–0.07)
Basophils Absolute: 0.1 10*3/uL (ref 0.0–0.1)
Basophils Relative: 1 %
Eosinophils Absolute: 0 10*3/uL (ref 0.0–0.5)
Eosinophils Relative: 0 %
HCT: 40.4 % (ref 39.0–52.0)
Hemoglobin: 13.4 g/dL (ref 13.0–17.0)
Immature Granulocytes: 12 %
Lymphocytes Relative: 6 %
Lymphs Abs: 0.7 10*3/uL (ref 0.7–4.0)
MCH: 32 pg (ref 26.0–34.0)
MCHC: 33.2 g/dL (ref 30.0–36.0)
MCV: 96.4 fL (ref 80.0–100.0)
Monocytes Absolute: 0.6 10*3/uL (ref 0.1–1.0)
Monocytes Relative: 4 %
Neutro Abs: 10.1 10*3/uL — ABNORMAL HIGH (ref 1.7–7.7)
Neutrophils Relative %: 77 %
Platelets: 159 10*3/uL (ref 150–400)
RBC: 4.19 MIL/uL — ABNORMAL LOW (ref 4.22–5.81)
RDW: 13.6 % (ref 11.5–15.5)
WBC: 13 10*3/uL — ABNORMAL HIGH (ref 4.0–10.5)
nRBC: 0 % (ref 0.0–0.2)

## 2021-10-18 LAB — GLUCOSE, CAPILLARY
Glucose-Capillary: 179 mg/dL — ABNORMAL HIGH (ref 70–99)
Glucose-Capillary: 190 mg/dL — ABNORMAL HIGH (ref 70–99)
Glucose-Capillary: 208 mg/dL — ABNORMAL HIGH (ref 70–99)
Glucose-Capillary: 219 mg/dL — ABNORMAL HIGH (ref 70–99)
Glucose-Capillary: 240 mg/dL — ABNORMAL HIGH (ref 70–99)
Glucose-Capillary: 241 mg/dL — ABNORMAL HIGH (ref 70–99)

## 2021-10-18 LAB — ANA: Anti Nuclear Antibody (ANA): NEGATIVE

## 2021-10-18 LAB — PROCALCITONIN: Procalcitonin: <0.1 ng/mL

## 2021-10-18 LAB — MAGNESIUM: Magnesium: 2.2 mg/dL (ref 1.7–2.4)

## 2021-10-18 LAB — PHOSPHORUS: Phosphorus: 3 mg/dL (ref 2.5–4.6)

## 2021-10-18 MED ORDER — HYDRALAZINE HCL 20 MG/ML IJ SOLN
10.0000 mg | Freq: Four times a day (QID) | INTRAMUSCULAR | Status: DC | PRN
Start: 2021-10-18 — End: 2021-10-24
  Administered 2021-10-18: 10 mg via INTRAVENOUS
  Filled 2021-10-18: qty 1

## 2021-10-18 MED ORDER — SODIUM CHLORIDE 0.9 % IV SOLN
2.0000 g | INTRAVENOUS | Status: DC
  Administered 2021-10-18 – 2021-10-22 (×5): 2 g via INTRAVENOUS
  Filled 2021-10-18 (×5): qty 20

## 2021-10-18 MED ORDER — ENSURE ENLIVE PO LIQD: 237.0000 mL | Freq: Two times a day (BID) | ORAL | Status: DC

## 2021-10-18 MED ORDER — ENSURE ENLIVE PO LIQD
237.0000 mL | Freq: Three times a day (TID) | ORAL | Status: DC
  Administered 2021-10-19 – 2021-10-21 (×4): 237 mL via ORAL

## 2021-10-18 MED ORDER — POTASSIUM CHLORIDE 20 MEQ PO PACK
20.0000 meq | PACK | Freq: Once | ORAL | Status: AC
Start: 2021-10-18 — End: 2021-10-18
  Administered 2021-10-18: 20 meq via ORAL
  Filled 2021-10-18 (×2): qty 1

## 2021-10-18 MED ORDER — POTASSIUM CHLORIDE CRYS ER 20 MEQ PO TBCR
40.0000 meq | EXTENDED_RELEASE_TABLET | Freq: Once | ORAL | Status: AC
  Administered 2021-10-18: 40 meq via ORAL
  Filled 2021-10-18: qty 2

## 2021-10-18 MED ORDER — HYDROCODONE BIT-HOMATROP MBR 5-1.5 MG/5ML PO SOLN
5.0000 mL | Freq: Two times a day (BID) | ORAL | Status: DC
  Administered 2021-10-18 – 2021-10-24 (×12): 5 mL via ORAL
  Filled 2021-10-18 (×12): qty 5

## 2021-10-18 MED ORDER — LIVING WELL WITH DIABETES BOOK
Freq: Once | Status: AC
Start: 2021-10-18 — End: 2021-10-18
  Filled 2021-10-18: qty 1

## 2021-10-18 MED ORDER — AMLODIPINE BESYLATE 5 MG PO TABS
5.0000 mg | ORAL_TABLET | Freq: Every day | ORAL | Status: DC
  Administered 2021-10-18 – 2021-10-24 (×7): 5 mg via ORAL
  Filled 2021-10-18 (×7): qty 1

## 2021-10-18 MED ORDER — HYDROCORTISONE SOD SUC (PF) 100 MG IJ SOLR
100.0000 mg | Freq: Two times a day (BID) | INTRAMUSCULAR | Status: AC
  Administered 2021-10-18 – 2021-10-22 (×10): 100 mg via INTRAVENOUS
  Filled 2021-10-18 (×10): qty 2

## 2021-10-18 NOTE — Consult Note (Signed)
[facility]       Reason for Consult: pneumonia    Referring Physician: Dr. Tyrell Antonio  Principal Problem:   Sepsis due to pneumonia [facility]) Active Problems:   GERD without esophagitis   Gout   Cough variant asthma vs UACS   Type 2 diabetes mellitus without complication, without long-term current use of insulin (HCC)   Pneumonia of both lungs due to infectious organism   Hypokalemia   Acute respiratory failure with hypoxia (HCC)    allopurinol  300 mg Oral Daily   amLODipine  5 mg Oral Daily   carvedilol  3.125 mg Oral BID WC   enoxaparin (LOVENOX) injection  40 mg Subcutaneous Q24H   feeding supplement  237 mL Oral BID BM   fluticasone  1 spray Each Nare Daily   HYDROcodone bit-homatropine  5 mL Oral BID   hydrocortisone sod succinate (SOLU-CORTEF) inj  100 mg Intravenous Q12H   insulin aspart  0-15 Units Subcutaneous Q4H   insulin glargine-yfgn  15 Units Subcutaneous Daily   ipratropium-albuterol  3 mL Nebulization BID   loratadine  10 mg Oral Daily   pantoprazole  40 mg Oral Daily   sodium chloride flush  3 mL Intravenous Q12H    Recommendations: Continue ceftriaxone  Assessment: He has pneumonia based on oxygen needs, CT findings and blood culture findings.    Antibiotics: Doxycycline, azithromycin, cefepime now ceftriaxone  HPI: Joseph Powell is a 66 y.o. 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  Respiratory panel with parainfluenza and blood culture positive for Haemophilis influenzae.  Now on ceftriaxone.  Initial WBC 29.5.  on high flow oxygen.   Review of Systems:  Constitutional: negative for fevers All other systems reviewed and are negative    Past Medical History:  Diagnosis Date   Allergic rhinitis    Asthma    slight   Bell's palsy     GERD (gastroesophageal reflux disease)    Gout    Hypercholesteremia    Melanoma (HCC)    Osteoarthritis    Sleep apnea    Resolving   Thrombocytopenia (HCC)    Tubular adenoma     Social History   Tobacco Use   Smoking status: Never    Passive exposure: Past   Smokeless tobacco: Never  Vaping Use   Vaping Use: Never used  Substance Use Topics   Alcohol use: Yes    Alcohol/week: 5.0 standard drinks of alcohol    Types: 5 Glasses of wine per week   Drug use: No    Family History  Problem Relation Age of Onset   Diabetes Mother    Asthma Mother    Gout Brother    Colon cancer Paternal Uncle    Esophageal cancer Neg Hx    Stomach cancer Neg Hx     Allergies  Allergen Reactions   Penicillamine    Penicillins Rash    Physical Exam: Constitutional: in no apparent distress  Vitals:   10/18/21 1100 10/18/21 1125  BP:    Pulse: 78 77  Resp: (!) 25 (!) 23  Temp:    SpO2: 91% 97%   EYES: anicteric Respiratory: difficult exam due to coughing with deep breaths but otherwise no respiratory distress at rest  Lab Results  Component Value Date  WBC 13.0 (H) 10/18/2021   HGB 13.4 10/18/2021   HCT 40.4 10/18/2021   MCV 96.4 10/18/2021   PLT 159 10/18/2021    Lab Results  Component Value Date   CREATININE 0.66 10/18/2021   BUN 10 10/18/2021   NA 141 10/18/2021   K 3.4 (L) 10/18/2021   CL 102 10/18/2021   CO2 29 10/18/2021    Lab Results  Component Value Date   ALT 33 10/17/2021   AST 43 (H) 10/17/2021   ALKPHOS 55 10/17/2021     Microbiology: Recent Results (from the past 240 hour(s))  Culture, blood (Routine x 2)     Status: None (Preliminary result)   Collection Time: 10/16/21  8:13 AM   Specimen: BLOOD LEFT ARM  Result Value Ref Range Status   Specimen Description   Final    BLOOD LEFT ARM Performed at [facility], [address] [place], [place] [address]    Special Requests   Final    BOTTLES DRAWN AEROBIC AND  ANAEROBIC Blood Culture adequate volume Performed at [facility], [address], [place], [place] [address]    Culture   Final    NO GROWTH 2 DAYS Performed at [facility], [place] [address] [place], [place] [address]    Report Status PENDING  Incomplete  Culture, blood (Routine x 2)     Status: None (Preliminary result)   Collection Time: 10/16/21  8:13 AM   Specimen: BLOOD RIGHT ARM  Result Value Ref Range Status   Specimen Description   Final    BLOOD RIGHT ARM Performed at [facility], [address], [place], [place] [address]    Special Requests   Final    BOTTLES DRAWN AEROBIC AND ANAEROBIC Blood Culture adequate volume Performed at [facility], [address] [place], [place] [address]    Culture  Setup Time   Final    GRAM NEGATIVE RODS ANAEROBIC BOTTLE ONLY CRITICAL RESULT CALLED TO, READ BACK BY AND VERIFIED WITH: PHARMD ELLEN JACKSON 10/18/21'@2'$ :30 BY TW Performed at [facility], [place] [address], [place],  [address]    Culture GRAM NEGATIVE RODS  Final   Report Status PENDING  Incomplete  Blood Culture ID Panel (Reflexed)     Status: Abnormal   Collection Time: 10/16/21  8:13 AM  Result Value Ref Range Status   Enterococcus faecalis NOT DETECTED NOT DETECTED Final   Enterococcus Faecium NOT DETECTED NOT DETECTED Final   Listeria monocytogenes NOT DETECTED NOT DETECTED Final   Staphylococcus species NOT DETECTED NOT DETECTED Final   Staphylococcus aureus (BCID) NOT DETECTED NOT DETECTED Final   Staphylococcus epidermidis NOT DETECTED NOT DETECTED Final   Staphylococcus lugdunensis NOT DETECTED NOT DETECTED Final   Streptococcus species NOT DETECTED NOT DETECTED Final   Streptococcus agalactiae NOT DETECTED NOT DETECTED Final   Streptococcus pneumoniae NOT DETECTED NOT DETECTED Final   Streptococcus pyogenes NOT DETECTED NOT DETECTED Final   A.calcoaceticus-baumannii NOT DETECTED NOT  DETECTED Final   Bacteroides fragilis NOT DETECTED NOT DETECTED Final   Enterobacterales NOT DETECTED NOT DETECTED Final   Enterobacter cloacae complex NOT DETECTED NOT DETECTED Final   Escherichia coli NOT DETECTED NOT DETECTED Final   Klebsiella aerogenes NOT DETECTED NOT DETECTED Final   Klebsiella oxytoca NOT DETECTED NOT DETECTED Final   Klebsiella pneumoniae NOT DETECTED NOT DETECTED Final   Proteus species NOT DETECTED NOT DETECTED Final   Salmonella species NOT DETECTED NOT DETECTED Final  Serratia marcescens NOT DETECTED NOT DETECTED Final   Haemophilus influenzae DETECTED (A) NOT DETECTED Final    Comment: CRITICAL RESULT CALLED TO, READ BACK BY AND VERIFIED WITH: PHARMD ELLEN JACKSON 10/18/21'@2'$ :30 BY TW    Neisseria meningitidis NOT DETECTED NOT DETECTED Final   Pseudomonas aeruginosa NOT DETECTED NOT DETECTED Final   Stenotrophomonas maltophilia NOT DETECTED NOT DETECTED Final   Candida albicans NOT DETECTED NOT DETECTED Final   Candida auris NOT DETECTED NOT DETECTED Final   Candida glabrata NOT DETECTED NOT DETECTED Final   Candida krusei NOT DETECTED NOT DETECTED Final   Candida parapsilosis NOT DETECTED NOT DETECTED Final   Candida tropicalis NOT DETECTED NOT DETECTED Final   Cryptococcus neoformans/gattii NOT DETECTED NOT DETECTED Final    Comment: Performed at [facility], [place] [address] [place], [place] [address]  Resp Panel by RT-PCR (Flu A&B, Covid) Anterior Nasal Swab     Status: None   Collection Time: 10/16/21  8:55 AM   Specimen: Anterior Nasal Swab  Result Value Ref Range Status   SARS Coronavirus 2 by RT PCR NEGATIVE NEGATIVE Final    Comment: (NOTE) SARS-CoV-2 target nucleic acids are NOT DETECTED.  The SARS-CoV-2 RNA is generally detectable in upper respiratory specimens during the acute phase of infection. The lowest concentration of SARS-CoV-2 viral copies this assay can detect is 138 copies/mL. A negative result does not preclude  SARS-Cov-2 infection and should not be used as the sole basis for treatment or other patient management decisions. A negative result may occur with  improper specimen collection/handling, submission of specimen other than nasopharyngeal swab, presence of viral mutation(s) within the areas targeted by this assay, and inadequate number of viral copies(<138 copies/mL). A negative result must be combined with clinical observations, patient history, and epidemiological information. The expected result is Negative.  Fact Sheet for Patients:  EntrepreneurPulse.com.au  Fact Sheet for Healthcare Providers:  IncredibleEmployment.be  This test is no t yet approved or cleared by the [place] FDA and  has been authorized for detection and/or diagnosis of SARS-CoV-2 by FDA under an Emergency Use Authorization (EUA). This EUA will remain  in effect (meaning this test can be used) for the duration of the COVID-19 declaration under Section 564(b)(1) of the Act, 21 U.S.C.section 360bbb-3(b)(1), unless the authorization is terminated  or revoked sooner.       Influenza A by PCR NEGATIVE NEGATIVE Final   Influenza B by PCR NEGATIVE NEGATIVE Final    Comment: (NOTE) The Xpert Xpress SARS-CoV-2/FLU/RSV plus assay is intended as an aid in the diagnosis of influenza from Nasopharyngeal swab specimens and should not be used as a sole basis for treatment. Nasal washings and aspirates are unacceptable for Xpert Xpress SARS-CoV-2/FLU/RSV testing.  Fact Sheet for Patients: EntrepreneurPulse.com.au  Fact Sheet for Healthcare Providers: IncredibleEmployment.be  This test is not yet approved or cleared by the [place] FDA and has been authorized for detection and/or diagnosis of SARS-CoV-2 by FDA under an Emergency Use Authorization (EUA). This EUA will remain in effect (meaning this test can be used) for the duration of  the COVID-19 declaration under Section 564(b)(1) of the Act, 21 U.S.C. section 360bbb-3(b)(1), unless the authorization is terminated or revoked.  Performed at [facility], [address], [place], [place] [address]   Respiratory (~20 pathogens) panel by PCR     Status: Abnormal   Collection Time: 10/16/21 10:50 AM   Specimen: Nasopharyngeal Swab; Respiratory  Result Value Ref Range Status  Adenovirus NOT DETECTED NOT DETECTED Final   Coronavirus 229E NOT DETECTED NOT DETECTED Final    Comment: (NOTE) The Coronavirus on the Respiratory Panel, DOES NOT test for the novel  Coronavirus (2019 nCoV)    Coronavirus HKU1 NOT DETECTED NOT DETECTED Final   Coronavirus NL63 NOT DETECTED NOT DETECTED Final   Coronavirus OC43 NOT DETECTED NOT DETECTED Final   Metapneumovirus NOT DETECTED NOT DETECTED Final   Rhinovirus / Enterovirus NOT DETECTED NOT DETECTED Final   Influenza A NOT DETECTED NOT DETECTED Final   Influenza B NOT DETECTED NOT DETECTED Final   Parainfluenza Virus 1 NOT DETECTED NOT DETECTED Final   Parainfluenza Virus 2 NOT DETECTED NOT DETECTED Final   Parainfluenza Virus 3 DETECTED (A) NOT DETECTED Final   Parainfluenza Virus 4 NOT DETECTED NOT DETECTED Final   Respiratory Syncytial Virus NOT DETECTED NOT DETECTED Final   Bordetella pertussis NOT DETECTED NOT DETECTED Final   Bordetella Parapertussis NOT DETECTED NOT DETECTED Final   Chlamydophila pneumoniae NOT DETECTED NOT DETECTED Final   Mycoplasma pneumoniae NOT DETECTED NOT DETECTED Final    Comment: Performed at [facility], [place] [address] [place], [place] [address]  Urine Culture     Status: None   Collection Time: 10/16/21 11:20 AM   Specimen: In/Out Cath Urine  Result Value Ref Range Status   Specimen Description   Final    IN/OUT CATH URINE Performed at [facility], [address], [place], [place] [address]    Special Requests   Final    NONE Performed at  [facility], [address] [place], [place] [address]    Culture   Final    NO GROWTH Performed at [facility], [place] [address] [place], [place] [address]    Report Status 10/17/2021 FINAL  Final  Expectorated Sputum Assessment w Gram Stain, Rflx to Resp Cult     Status: None   Collection Time: 10/17/21  4:31 AM   Specimen: Sputum  Result Value Ref Range Status   Specimen Description SPUTUM  Final   Special Requests NONE  Final   Sputum evaluation   Final    THIS SPECIMEN IS ACCEPTABLE FOR SPUTUM CULTURE Performed at [facility], [place] [address] [place], [place] [address]    Report Status 10/17/2021 FINAL  Final  Culture, Respiratory w Gram Stain     Status: None (Preliminary result)   Collection Time: 10/17/21  4:31 AM   Specimen: SPU  Result Value Ref Range Status   Specimen Description   Final    SPUTUM Performed at [place] [address], [place], [place] [address]    Special Requests   Final    NONE Reflexed from [id number] Performed at [facility], [place] [address] [place], [place] [address]    Gram Stain   Final    ABUNDANT WBC PRESENT, PREDOMINANTLY PMN FEW GRAM POSITIVE COCCI FEW GRAM NEGATIVE RODS FEW GRAM POSITIVE RODS    Culture   Final    CULTURE REINCUBATED FOR BETTER GROWTH Performed at [facility], [place] [address] [place], [place] [address]    Report Status PENDING  Incomplete  MRSA Next Gen by PCR, Nasal     Status: None   Collection Time: 10/17/21  5:35 PM   Specimen: Nasal Mucosa; Nasal Swab  Result Value Ref Range Status   MRSA by PCR Next Gen NOT DETECTED NOT DETECTED Final  Comment: (NOTE) The GeneXpert MRSA Assay (FDA approved for NASAL specimens only), is one component of a comprehensive MRSA colonization surveillance program. It is not intended to diagnose MRSA infection nor to guide or monitor treatment for MRSA infections. Test performance is  not FDA approved in patients less than 82 years old. Performed at [facility], [place] [address] [place],  [address]     Klair Leising W Zita Ozimek, MD [facility] [email or web address] 10/18/2021, 1:26 PM

## 2021-10-18 NOTE — Progress Notes (Signed)
Patient placed on BiPAP at this time.  

## 2021-10-18 NOTE — Consult Note (Signed)
NAME:  Joseph Powell, MRN:  [id number], DOB:  02/26/1956, LOS: 2 ADMISSION DATE:  10/16/2021, CONSULTATION DATE:  10/17/21 REFERRING MD:  Dr Tyrell Antonio, CHIEF COMPLAINT:  Acute hypoxemic resp virust - PArainfleunza 3    BRIEF  -66 year old male who is status post CHOP and stem cell transplant for history of lymphoma T-cell non-Hodgkin's believed to be in complete remission in 2019.  He follows with Dr. Christinia Gully for chronic cough asthma and allergies.  2018 had normal spirometry and allergy profile in September 2022 with normal IgE and no eosinophils in 2023 exhaled nitric oxide was normal.  Most recent chest x-ray was in April 2023 with peribronchial thickening but no infiltrates [in 2019 did have a CT scan of the chest that had a left lower lobe infiltrate] most recent pulmonary clinic follow-up April 2023 showed improvement in cough with short course prednisone.  Consider this cough variant asthma  Now in the middle of May 2023 he went on a remote close and also travel to [place] in [place] in [place].  1 week into this day because of lack of air conditioning a left is with the room open and had a fan blowing into his face.  Shortly after that he and his wife got sick.  This was around May 27/28 2023.  Then on October 03, 2021 he was evaluated by a physician and then again also on October 08, 2021.  At no point his pulse ox was checked.  He was given azithromycin.  His wife recovered with the same antibiotic but Sum[place]ya starting her on October 08, 2021 was actually getting short of breath.  He arrived back in [place] on November 01, 2021.  By this time he felt like he was hypoxemic and also short of breath with minimal exertion and having significant cough.  Presented to droppage emergency room 10/16/2021 found to be acute hypoxemic respiratory failure.  Had bilateral lower lobe pulmonary infiltrates.  White count was elevated.  Started on Antibiotics but procalcitonin normal.  Also given fluids.   If progressive worsening hypoxemia in the emergency department admitted to progressive unit at [facility].  Pulmonary critical care consulted morning of 10/17/2021 requiring 25 L high flow nasal cannula.  Desaturations with cough noted.  Some associated malaise and poor appetite.  No chest pains.  The cough is dry.  No edema no hemoptysis.  CT angiogram dated, troponin, D-dimer, echo data not available.  No urine strep or Legionella.  He is positive for parainfluenza virus 3.   Past Medical History:    has a past medical history of Allergic rhinitis, Asthma, Bell's palsy, GERD (gastroesophageal reflux disease), Gout, Hypercholesteremia, Melanoma ([place]), Osteoarthritis, Sleep apnea, Thrombocytopenia ([place]), and Tubular adenoma.   has a past surgical history that includes Knee surgery (1993); Vasectomy (1999); Nasal reconstruction; Melanoma excision; IR US Guide Vasc Access Right (02/17/2017); IR FLUORO GUIDE PORT INSERTION RIGHT (02/17/2017); and IR REMOVAL TUN ACCESS W/ PORT W/O FL MOD SED (01/05/2018).    Significant Hospital Events:  10/16/2021 - admit. PIV 3+ 6/15 - ccm consut. 25L . STart BiPAP x 2 in day with QHS and IV Steroid Solumedrol  - CTA - neg PE  - BCID /14 wirh H Flu  - QUant gold -    - Urine strep - neg  - coviud IgG - neg  = HIV neg  - PCT < 0.1  - ESR 32  - ECHO - LVEF 65%. Normal DDx. Mild reduced RV  Function    SUBJECTIVE/OVERNIGHT/INTERVAL HX   6/16- di dnot toelrated BiPAP last night.  He says nurses thought he was anxious. He denies it. Says the BiPAP produced no air. He is willing to try BiPAP this AM. REmains on 25L HFNC . BCID with H Flu. Abx being narrowed to ceftriaxone. Coughs and when sat our of bed on 25L [place] - desat to 85%-88%.  Movements produce cough.     Objective   Blood pressure (!) 165/97, pulse 79, temperature 98.6 F (37 C), temperature source Oral, resp. rate (!) 23, height $RemoveBe'5\' 11"'nNMiMuxlP$  (1.803 m), weight 84.8 kg, SpO2 94 %.    FiO2 (%):   [50 %] 50 %   Intake/Output Summary (Last 24 hours) at 10/18/2021 1021 Last data filed at 10/18/2021 0553 Gross per 24 hour  Intake 795.67 ml  Output 300 ml  Net 495.67 ml   Filed Weights   10/16/21 0800  Weight: 84.8 kg    Examination: General Appearance:  Looks cstable but when coughs desaturates or when he sits on side of bed Head:  Normocephalic, without obvious abnormality, atraumatic Eyes:  PERRL - yes, conjunctiva/corneas - clear     Ears:  Normal external ear canals, both ears Nose:  G tube - NO but has HFNC 25L Throat:  ETT TUBE - no , OG tube - no Neck:  Supple,  No enlargement/tenderness/nodules Lungs:  No apparent increaes in wob. Coughs. Crackles at base. HFNC on Heart:  S1 and S2 normal, no murmur, CVP - no.  Pressors - no Abdomen:  Soft, no masses, no organomegaly Genitalia / Rectal:  Not done Extremities:  Extremities- intac Skin:  ntact in exposed areas . Sacral area - not examined Neurologic:  Sedation - none -> RASS - +1 . Moves all 4s - yes. CAM-ICU - ndg . Orientation - x3+    X  Assessment & Plan:  ASSESSMENT / PLAN:  PULMONARY  A:  Has history of chronic cough secondary to upper airway cough syndrome [.  Chest x-ray in April 2023 but no prior CT scan chest]  -No known ILD   Acute hypoxic respiratory failure with associated pulmonary infiltrates/ALOI - Present on Admit -  due to parainfluenza virus 3 and secondary CAP H Flu in BCID -requiring 25 L nasal cannula high flow - Present on Admit  Severe cough and desaturation secondary to the above   10/18/2021 -> 25 L high flow.  Did tolerate day time biPAP. Night time - he denies confusion/ Says machine di dnot work. . Says  Clinically NEARLY MEETS new 2023 Berlin criteria for acute lung injury [ARDS is if there are greater than 30 L of oxygen].     P:   Cotnineu o2 for pusle ox > 90% RESTART$RemoveBefo' \\y'aDygiYWaiiY$  BiPAP 2-4 hours DDDAY TIME cycles x2 in the daytime and nightly  Change IV solumderol to HYDROCORT  (NEJM 2023 data for CAP) for 5 days at $Remov'200mg'jiXoyv$  daily and then taper over nanother 5 days  -Await autoimme   Cough suppression with hycodane - to incraes  CAP Rx with Ceftriaxone  Move to ICU if worse   AT risk Delirium  Plan  Monitor  Hypertension  Plan  D/w Dr Genice Rouge- triad to control  Best practice (daily eval):  According to the hospitalist  Goals of Care:   Multi-Disciplinary Goals of Care Discussion    Family Updates: Full code.  He and his wife updated at the bedside.  Also spoke to the hospitalist.  Anticipated recovery can be days to weeks.     ATTESTATION & SIGNATURE   The patient KENNARD FILDES is critically ill with multiple organ systems failure and requires high complexity decision making for assessment and support, frequent evaluation and titration of therapies, application of advanced monitoring technologies and extensive interpretation of multiple databases.   Critical Care Time devoted to patient care services described in this note is  40  Minutes. This time reflects time of care of this signee Dr Brand Males. This critical care time does not reflect procedure time, or teaching time or supervisory time of PA/NP/Med student/Med Resident etc but could involve care discussion time     Dr. Brand Males, M.D., [facility] Pulmonary and Critical Care Medicine Medical Director - [facility] ICU Staff Physician, [place] [facility] Pager: [phone], If no answer or between  15:00h - 7:00h: call [phone]  10/18/2021 11:14 AM     SIGNATURE    Dr. Brand Males, M.D., F.C.C.P,  Pulmonary and Critical Care Medicine Staff Physician, [place] Director - Interstitial Lung Disease  Program  Pulmonary [place] at [place], [place], [address]  NPI Number:  NPI #[id number]  Pager: [phone], If no answer  -> Check AMION or  Try [phone] Telephone (clinical office): [phone] Telephone (research): [phone]  10:21 AM 10/18/2021   10/18/2021 10:21 AM    LABS    PULMONARY Recent Labs  Lab 10/17/21 0539  HCO3 29.2*  O2SAT 52.7    CBC Recent Labs  Lab 10/16/21 0813 10/17/21 0736 10/18/21 0735  HGB 13.5 12.1* 13.4  HCT 39.8 37.0* 40.4  WBC 29.5* 17.7* 13.0*  PLT 150 125* 159    COAGULATION Recent Labs  Lab 10/16/21 0813  INR 1.3*    CARDIAC  No results for input(s): "TROPONINI" in the last 168 hours. No results for input(s): "PROBNP" in the last 168 hours.  CHEMISTRY Recent Labs  Lab 10/16/21 0813 10/16/21 0855 10/17/21 0507 10/18/21 0735  NA 136  --  139 141  K 3.2*  --  3.8 3.4*  CL 95*  --  103 102  CO2 22  --  26 29  GLUCOSE 142*  --  152* 229*  BUN 9  --  6* 10  CREATININE 0.63  --  0.55* 0.66  CALCIUM 9.5  --  8.1* 8.8*  MG  --  1.7 1.9 2.2  PHOS  --  3.1  --  3.0   Estimated Creatinine Clearance: 96.7 mL/min (by C-G formula based on SCr of 0.66 mg/dL).   LIVER Recent Labs  Lab 10/16/21 0813 10/17/21 0507  AST 50* 43*  ALT 36 33  ALKPHOS 65 55  BILITOT 1.0 0.7  PROT 6.1* 5.2*  ALBUMIN 3.6 2.6*  INR 1.3*  --      INFECTIOUS Recent Labs  Lab 10/16/21 0827 10/16/21 0958 10/17/21 0507 10/18/21 0735  LATICACIDVEN 1.5 1.5  --   --   PROCALCITON  --   --  <0.10 <0.10     ENDOCRINE CBG (last 3)  Recent Labs    10/17/21 2341 10/18/21 0337 10/18/21 0741  GLUCAP 250* 240* 241*         IMAGING x48h  - image(s) personally visualized  -   highlighted in bold ECHOCARDIOGRAM COMPLETE  Result Date: 10/17/2021    ECHOCARDIOGRAM REPORT   Patient  Name:   Joseph Powell Date of Exam: 10/17/2021 Medical Rec #:  [id number]         Height:       71.0 in Accession #:    [id number]        Weight:       187.0 lb Date of Birth:  1955-06-14         BSA:          2.049 m Patient Age:    73 years          BP:           160/87 mmHg Patient Gender:  M                 HR:           87 bpm. Exam Location:  Inpatient Procedure: 2D Echo, Cardiac Doppler and Color Doppler Indications:    Acute respiratory distress  History:        Patient has prior history of Echocardiogram examinations, most                 recent 11/29/2019. Risk Factors:Diabetes.  Sonographer:    Jefferey Pica Referring Phys: [phone] Demetrice Amstutz  Sonographer Comments: Image acquisition challenging due to respiratory motion. IMPRESSIONS  1. Left ventricular ejection fraction, by estimation, is 60 to 65%. The left ventricle has normal function. The left ventricle has no regional wall motion abnormalities. There is mild concentric left ventricular hypertrophy. Left ventricular diastolic parameters were normal.  2. Right ventricular systolic function is low normal. The right ventricular size is mildly enlarged.  3. The mitral valve is normal in structure. No evidence of mitral valve regurgitation. No evidence of mitral stenosis.  4. The aortic valve is normal in structure. Aortic valve regurgitation is not visualized. No aortic stenosis is present.  5. The inferior vena cava is normal in size with greater than 50% respiratory variability, suggesting right atrial pressure of 3 mmHg. FINDINGS  Left Ventricle: Left ventricular ejection fraction, by estimation, is 60 to 65%. The left ventricle has normal function. The left ventricle has no regional wall motion abnormalities. The left ventricular internal cavity size was normal in size. There is  mild concentric left ventricular hypertrophy. Left ventricular diastolic parameters were normal. Right Ventricle: The right ventricular size is mildly enlarged. No increase in right ventricular wall thickness. Right ventricular systolic function is low normal. Left Atrium: Left atrial size was normal in size. Right Atrium: Right atrial size was normal in size. Pericardium: There is no evidence of pericardial effusion. Presence of epicardial fat layer. Mitral  Valve: The mitral valve is normal in structure. No evidence of mitral valve regurgitation. No evidence of mitral valve stenosis. Tricuspid Valve: The tricuspid valve is normal in structure. Tricuspid valve regurgitation is not demonstrated. No evidence of tricuspid stenosis. Aortic Valve: The aortic valve is normal in structure. Aortic valve regurgitation is not visualized. No aortic stenosis is present. Aortic valve peak gradient measures 8.5 mmHg. Pulmonic Valve: The pulmonic valve was normal in structure. Pulmonic valve regurgitation is not visualized. No evidence of pulmonic stenosis. Aorta: The aortic root is normal in size and structure. Venous: The inferior vena cava is normal in size with greater than 50% respiratory variability, suggesting right atrial pressure of 3 mmHg. IAS/Shunts: No atrial level shunt detected by color flow Doppler.  LEFT VENTRICLE PLAX 2D LVIDd:         4.20 cm   Diastology LVIDs:  2.30 cm   LV e' medial:    10.10 cm/s LV PW:         1.00 cm   LV E/e' medial:  5.8 LV IVS:        1.10 cm   LV e' lateral:   12.40 cm/s LVOT diam:     2.00 cm   LV E/e' lateral: 4.7 LV SV:         79 LV SV Index:   39 LVOT Area:     3.14 cm  RIGHT VENTRICLE RV Basal diam:  3.90 cm RV Mid diam:    4.30 cm LEFT ATRIUM             Index        RIGHT ATRIUM           Index LA diam:        4.90 cm 2.39 cm/m   RA Area:     16.50 cm LA Vol (A2C):   39.4 ml 19.23 ml/m  RA Volume:   42.30 ml  20.64 ml/m LA Vol (A4C):   50.3 ml 24.55 ml/m LA Biplane Vol: 44.8 ml 21.86 ml/m  AORTIC VALVE                 PULMONIC VALVE AV Area (Vmax): 2.71 cm     PV Vmax:       1.13 m/s AV Vmax:        146.00 cm/s  PV Peak grad:  5.1 mmHg AV Peak Grad:   8.5 mmHg LVOT Vmax:      126.00 cm/s LVOT Vmean:     77.400 cm/s LVOT VTI:       0.253 m  AORTA Ao Root diam: 3.60 cm Ao Asc diam:  3.50 cm MITRAL VALVE MV Area (PHT): 3.91 cm    SHUNTS MV Decel Time: 194 msec    Systemic VTI:  0.25 m MV E velocity: 58.20 cm/s  Systemic  Diam: 2.00 cm MV A velocity: 78.10 cm/s MV E/A ratio:  0.75 Kardie Tobb DO Electronically signed by Berniece Salines DO Signature Date/Time: 10/17/2021/3:21:08 PM    Final    CT Angio Chest Pulmonary Embolism (PE) W or WO Contrast  Result Date: 10/17/2021 CLINICAL DATA:  Chest pain or SOB, pleurisy or effusion suspected EXAM: CT ANGIOGRAPHY CHEST WITH CONTRAST TECHNIQUE: Multidetector CT imaging of the chest was performed using the standard protocol during bolus administration of intravenous contrast. Multiplanar CT image reconstructions and MIPs were obtained to evaluate the vascular anatomy. RADIATION DOSE REDUCTION: This exam was performed according to the departmental dose-optimization program which includes automated exposure control, adjustment of the mA and/or kV according to patient size and/or use of iterative reconstruction technique. CONTRAST:  78mL OMNIPAQUE IOHEXOL 350 MG/ML SOLN COMPARISON:  Correlation is made with a chest x-ray dated October 16, 2021 FINDINGS: Cardiovascular: Satisfactory opacification of the pulmonary arteries to the segmental level. No evidence of pulmonary embolism. Normal heart size. No pericardial effusion. Thoracic aorta has a normal appearance. Mild atheromatous calcifications of the arch of the aorta and coronary arteries. Mediastinum/Nodes: No enlarged mediastinal, hilar, or axillary lymph nodes. Thyroid gland, trachea, and esophagus demonstrate no significant findings. Moderate thickening of the distal esophagus. Lungs/Pleura: There are patchy confluent opacities seen at the bilateral mid and lower lung zones and are more prominent at the bilateral lower lobes. There is some consolidation seen at the left lung base. No pleural effusion. Upper Abdomen: No acute abnormality. Musculoskeletal: Mild-to-moderate thoracic spondylosis with  prominent anterior marginal osteophytes. Review of the MIP images confirms the above findings. IMPRESSION: There are patchy confluent opacities seen  at the bilateral mid and lower lung zones with an area of consolidation at the left lung base. These findings may represent pneumonia with superimposed atelectasis. No pleural effusion. Moderate circumferential thickening of the distal esophagus. Electronically Signed   By: Frazier Richards M.D.   On: 10/17/2021 10:41

## 2021-10-18 NOTE — Plan of Care (Signed)
  Problem: Education: Goal: Knowledge of General Education information will improve Description: Including pain rating scale, medication(s)/side effects and non-pharmacologic comfort measures Outcome: Progressing   Problem: Clinical Measurements: Goal: Diagnostic test results will improve Outcome: Progressing Goal: Respiratory complications will improve Outcome: Progressing   Problem: Coping: Goal: Level of anxiety will decrease Outcome: Progressing   Problem: Safety: Goal: Ability to remain free from injury will improve Outcome: Progressing

## 2021-10-18 NOTE — Progress Notes (Signed)
Initial Nutrition Assessment  DOCUMENTATION CODES:   Non-severe (moderate) malnutrition in context of chronic illness  INTERVENTION:  - continue Ensure Plus High Protein and will increase from BID to TID, each supplement provides 350 kcal and 20 grams of protein. - will order 1 tablet multivitamin with minerals/day.    NUTRITION DIAGNOSIS:   Moderate Malnutrition related to chronic illness as evidenced by mild muscle depletion, percent weight loss.  GOAL:   Patient will meet greater than or equal to 90% of their needs  MONITOR:   PO intake, Supplement acceptance, Labs, Weight trends  REASON FOR ASSESSMENT:   Malnutrition Screening Tool  ASSESSMENT:   66 year old male who is s/p CHOP and stem cell transplant for lymphoma T-cell non-Hodgkin's believed to be in complete remission in 2019, hypercholesterolemia, GERD, sleep apnea, Bell's palsy, osteoarthritis, and asthma. He presented to the ED on 6/14 due to acute hypoxemic respiratory failure. He was found to have bilateral lower lobe pulmonary infiltrates. He was admitted for sepsis due to PNA.  Patient laying in bed with his wife at bedside. Patient was on BiPAP at the time of RD visit but was able to indicate that he is not experiencing abdominal pain, pressure, discomfort, or nausea.   Patient's wife shares that they returned home 6 days early from their trip d/t patient feeling unwell. She shares that yesterday and today patient has consumed very little, mainly liquids such as jello, broth, and Ensure and that he had some cottage cheese this AM. His appetite has been poor. She feels that he has been losing weight rapidly with feeling unwell.  Patient's wife was on a call at the time of RD visit so kept visit today brief to be mindful of this.  Weight on 6/14 was 187 lb and weight on 4/17 was 201 lb. This indicates 14 lb weight loss (7% body weight) in the past 2 months; significant for time frame.    Labs reviewed; 100 mg  solu-medrol BID, sliding scale novolog, 15 units semglee/day, 40 mg oral protonix/day, 40 mEq Klor-Con x1 dose 6/16.  Medications reviewed; CBGs: 240, 241, 219 mg/dl, K: 3.4 mmol/l.    NUTRITION - FOCUSED PHYSICAL EXAM:  Flowsheet Row Most Recent Value  Orbital Region Unable to assess  [BiPAP]  Upper Arm Region Mild depletion  Thoracic and Lumbar Region Unable to assess  Buccal Region Unable to assess  [BiPAP]  Temple Region No depletion  Clavicle Bone Region Mild depletion  Clavicle and Acromion Bone Region Mild depletion  Scapular Bone Region No depletion  Dorsal Hand No depletion  Patellar Region Mild depletion  Anterior Thigh Region Mild depletion  Posterior Calf Region Mild depletion  Edema (RD Assessment) None  Hair Reviewed  Eyes Reviewed  Mouth Unable to assess  Skin Reviewed  Nails Reviewed       Diet Order:   Diet Order             Diet heart healthy/carb modified Room service appropriate? Yes; Fluid consistency: Thin  Diet effective now                   EDUCATION NEEDS:   Not appropriate for education at this time  Skin:  Skin Assessment: Reviewed RN Assessment  Last BM:  6/15 (type 7 x2, medium amounts)  Height:   Ht Readings from Last 1 Encounters:  10/16/21 '5\' 11"'$  (1.803 m)    Weight:   Wt Readings from Last 1 Encounters:  10/16/21 84.8 kg  BMI:  Body mass index is 26.08 kg/m.  Estimated Nutritional Needs:  Kcal:  2150-2400 kcal Protein:  110-125 grams Fluid:  >/= 2.3 L/day     Jarome Matin, MS, RD, LDN, CNSC Registered Dietitian II Inpatient Clinical Nutrition RD pager # and on-call/weekend pager # available in [facility]

## 2021-10-18 NOTE — Progress Notes (Signed)
Pt switched from bipap to highflow. Tolerating well at this time.

## 2021-10-18 NOTE — Progress Notes (Signed)
Bilateral lower extremity venous duplex has been completed. Preliminary results can be found in CV Proc through chart review.   10/18/21 10:39 AM Joseph Powell RVT

## 2021-10-18 NOTE — Progress Notes (Signed)
Patient removed from BiPAP and placed on HHFNC 25L, 50% at this time for a break off on the BiPAP. RN aware.

## 2021-10-18 NOTE — Progress Notes (Signed)
Pt placed back on bipap for the night. 

## 2021-10-18 NOTE — Progress Notes (Signed)
PROGRESS NOTE    MANG Joseph Powell  HQP:[id number] DOB: 1955/08/28 DOA: 10/16/2021 PCP: Faustino Congress, NP   Brief Narrative: 66 year old with past medical history significant for lymphoma diagnosed 2018, had stem cell transplant 2019, has done well since that time.  Followed by oncologist at [facility], last evaluation August 16, 2021, patient is in remission.  Patient present with worsening shortness of breath, and fevers.  He just returned from a trip from [place], 6/13. He developed shortness of breath and congestion, June first.  During these illness he received azithromycin initially for 3 days then subsequently extended to 7 days.  He presents with worsening shortness of breath, cough.    He was found to be hypoxic oxygen saturation 86 on room air, he required up to 9 L high flow oxygen. Chest x ray: Background pattern of scattered pulmonary scaring.  Superimposed acute bronchial thickening and patchy pulmonary infiltrates in the mid and lower lungs consistent with bronchopneumonia.  Patient found to have Haemophilus Influenzae  Bacteremia, para-influenza 3. Bacterial PNA.    Assessment & Plan:   Principal Problem:   Sepsis due to pneumonia [facility]) Active Problems:   Pneumonia of both lungs due to infectious organism   Acute respiratory failure with hypoxia ([place])   Type 2 diabetes mellitus without complication, without long-term current use of insulin (HCC)   Cough variant asthma vs UACS   Hypokalemia   GERD without esophagitis   Gout  1-Sepsis secondary to pneumonia: Haemophilus Influenzae Bacteremia  Patient presents with acute hypoxic respiratory failure, leukocytosis, tachypnea,tachycardia/ in setting of PNA, parainfluenza, concern for superimpose bacterial infection.  -Parainfluenza 3 Positive.  -Blood culture. Haemophilus Influenzae -Antibiotics change to Ceftriaxone to cover for Haemophilus influenzae.  -Follow sputum culture pending, legionella antigen pending, strep  pneumonia negative. MRSA PCR> negative -CTA negative for PE>  -ID consulted.  -WBC trending down.   Acute hypoxic respiratory failure secondary to pneumonia, : Bilateral Pneumonia; Parainfluenza and Haemophilus Influenzae PNA Patent requiring HFNC Oxygen currently at 25 L 50 %  CCM consulted. ANA negative, RF Cyclic Citruli pending.  Continue with nebulizer.  Continue with IV antibiotics, cefepime and Doxycycline.  Elevation BNP-- ECHO normal EF, Low normal RV function in setting of PNA>  Change Solumedrol to Hydrocortisone.   Diabetes type 2: SSI.  Started  Long acting insulin while on steroids.  A1c 6.6. Suspect elevated CBG in setting of steroids.   Cough variant asthma: continue with nebulizer.   Hypokalemia: replete with 40 meq times 2.  Otitis; suspect related to viral infection/Haemophilus influenzae. . Claritin/   Gout: Resume Allopurinol.   Moderate circumferential thickening of the distal esophagus by CT; Started PPI. Needs follow up when he recovers from PNA with gastroenterologist.   HTN; continue with Carvedilol. Started Norvasc. [place] Hydralazine.   Elevated D dimer: CTA negative for PE. Doppler LE; Negative for DVT.   Estimated body mass index is 26.08 kg/m as calculated from the following:   Height as of this encounter: '5\' 11"'$  (1.803 m).   Weight as of this encounter: 84.8 kg.   DVT prophylaxis: Lovenox Code Status: Full code Family Communication: Wife at bedside.  Disposition Plan:  Status is: Inpatient Remains inpatient appropriate because: remain inpatient for management of Resp failure.     Consultants:  CCM  Procedures:  ECHO pending  Antimicrobials:  Cefepime, Doxy   Subjective: He reports cough. He relates he was not anxious last night, BIPAP was not providing enough air. Willing to try BIPAP this am. He  report feeling ok.  Having hiccups.    Objective: Vitals:   10/18/21 0533 10/18/21 0900 10/18/21 0913 10/18/21 0938  BP: (!)  168/103  (!) 156/87 (!) 165/97  Pulse:   83 79  Resp: 20  (!) 31 (!) 23  Temp: 97.8 F (36.6 C)   98.6 F (37 C)  TempSrc: Oral   Oral  SpO2: 95% 93% 91% 94%  Weight:      Height:        Intake/Output Summary (Last 24 hours) at 10/18/2021 1004 Last data filed at 10/18/2021 0553 Gross per 24 hour  Intake 795.67 ml  Output 300 ml  Net 495.67 ml    Filed Weights   10/16/21 0800  Weight: 84.8 kg    Examination:  General exam: NAD Respiratory system: BL ronchus.  Cardiovascular system: S 1, S 2 RRR Gastrointestinal system: BS present, soft, nt Central nervous system: Alert Extremities: no edema    Data Reviewed: I have personally reviewed following labs and imaging studies  CBC: Recent Labs  Lab 10/16/21 0813 10/17/21 0736 10/18/21 0735  WBC 29.5* 17.7* 13.0*  NEUTROABS 21.1* 12.2* 10.1*  HGB 13.5 12.1* 13.4  HCT 39.8 37.0* 40.4  MCV 92.1 97.1 96.4  PLT 150 125* 409    Basic Metabolic Panel: Recent Labs  Lab 10/16/21 0813 10/16/21 0855 10/17/21 0507 10/18/21 0735  NA 136  --  139 141  K 3.2*  --  3.8 3.4*  CL 95*  --  103 102  CO2 22  --  26 29  GLUCOSE 142*  --  152* 229*  BUN 9  --  6* 10  CREATININE 0.63  --  0.55* 0.66  CALCIUM 9.5  --  8.1* 8.8*  MG  --  1.7 1.9 2.2  PHOS  --  3.1  --  3.0    GFR: Estimated Creatinine Clearance: 96.7 mL/min (by C-G formula based on SCr of 0.66 mg/dL). Liver Function Tests: Recent Labs  Lab 10/16/21 0813 10/17/21 0507  AST 50* 43*  ALT 36 33  ALKPHOS 65 55  BILITOT 1.0 0.7  PROT 6.1* 5.2*  ALBUMIN 3.6 2.6*    No results for input(s): "LIPASE", "AMYLASE" in the last 168 hours. No results for input(s): "AMMONIA" in the last 168 hours. Coagulation Profile: Recent Labs  Lab 10/16/21 0813  INR 1.3*    Cardiac Enzymes: No results for input(s): "CKTOTAL", "CKMB", "CKMBINDEX", "TROPONINI" in the last 168 hours. BNP (last 3 results) No results for input(s): "PROBNP" in the last 8760  hours. HbA1C: Recent Labs    10/17/21 0507  HGBA1C 6.6*   CBG: Recent Labs  Lab 10/17/21 1627 10/17/21 1934 10/17/21 2341 10/18/21 0337 10/18/21 0741  GLUCAP 262* 250* 250* 240* 241*   Lipid Profile: No results for input(s): "CHOL", "HDL", "LDLCALC", "TRIG", "CHOLHDL", "LDLDIRECT" in the last 72 hours. Thyroid Function Tests: No results for input(s): "TSH", "T4TOTAL", "FREET4", "T3FREE", "THYROIDAB" in the last 72 hours. Anemia Panel: No results for input(s): "VITAMINB12", "FOLATE", "FERRITIN", "TIBC", "IRON", "RETICCTPCT" in the last 72 hours. Sepsis Labs: Recent Labs  Lab 10/16/21 0827 10/16/21 0958 10/17/21 0507 10/18/21 0735  PROCALCITON  --   --  <0.10 <0.10  LATICACIDVEN 1.5 1.5  --   --      Recent Results (from the past 240 hour(s))  Culture, blood (Routine x 2)     Status: None (Preliminary result)   Collection Time: 10/16/21  8:13 AM   Specimen: BLOOD LEFT ARM  Result  Value Ref Range Status   Specimen Description   Final    BLOOD LEFT ARM Performed at [facility], [address], [place], [place] [address]    Special Requests   Final    BOTTLES DRAWN AEROBIC AND ANAEROBIC Blood Culture adequate volume Performed at [facility], [address] [place], [place] [address]    Culture   Final    NO GROWTH < 24 HOURS Performed at [facility], [place] [address], [place], [place] [address]    Report Status PENDING  Incomplete  Culture, blood (Routine x 2)     Status: None (Preliminary result)   Collection Time: 10/16/21  8:13 AM   Specimen: BLOOD RIGHT ARM  Result Value Ref Range Status   Specimen Description   Final    BLOOD RIGHT ARM Performed at [facility], [address], [place], [place] [address]    Special Requests   Final    BOTTLES DRAWN AEROBIC AND ANAEROBIC Blood Culture adequate volume Performed at [facility], [address], [place], [place]  [address]    Culture  Setup Time   Final    GRAM NEGATIVE RODS ANAEROBIC BOTTLE ONLY CRITICAL RESULT CALLED TO, READ BACK BY AND VERIFIED WITH: PHARMD ELLEN JACKSON 10/18/21'@2'$ :30 BY TW Performed at [facility], [place] [address] [place], [place] [address]    Culture GRAM NEGATIVE RODS  Final   Report Status PENDING  Incomplete  Blood Culture ID Panel (Reflexed)     Status: Abnormal   Collection Time: 10/16/21  8:13 AM  Result Value Ref Range Status   Enterococcus faecalis NOT DETECTED NOT DETECTED Final   Enterococcus Faecium NOT DETECTED NOT DETECTED Final   Listeria monocytogenes NOT DETECTED NOT DETECTED Final   Staphylococcus species NOT DETECTED NOT DETECTED Final   Staphylococcus aureus (BCID) NOT DETECTED NOT DETECTED Final   Staphylococcus epidermidis NOT DETECTED NOT DETECTED Final   Staphylococcus lugdunensis NOT DETECTED NOT DETECTED Final   Streptococcus species NOT DETECTED NOT DETECTED Final   Streptococcus agalactiae NOT DETECTED NOT DETECTED Final   Streptococcus pneumoniae NOT DETECTED NOT DETECTED Final   Streptococcus pyogenes NOT DETECTED NOT DETECTED Final   A.calcoaceticus-baumannii NOT DETECTED NOT DETECTED Final   Bacteroides fragilis NOT DETECTED NOT DETECTED Final   Enterobacterales NOT DETECTED NOT DETECTED Final   Enterobacter cloacae complex NOT DETECTED NOT DETECTED Final   Escherichia coli NOT DETECTED NOT DETECTED Final   Klebsiella aerogenes NOT DETECTED NOT DETECTED Final   Klebsiella oxytoca NOT DETECTED NOT DETECTED Final   Klebsiella pneumoniae NOT DETECTED NOT DETECTED Final   Proteus species NOT DETECTED NOT DETECTED Final   Salmonella species NOT DETECTED NOT DETECTED Final   Serratia marcescens NOT DETECTED NOT DETECTED Final   Haemophilus influenzae DETECTED (A) NOT DETECTED Final    Comment: CRITICAL RESULT CALLED TO, READ BACK BY AND VERIFIED WITH: PHARMD ELLEN JACKSON 10/18/21'@2'$ :30 BY TW    Neisseria meningitidis NOT DETECTED NOT  DETECTED Final   Pseudomonas aeruginosa NOT DETECTED NOT DETECTED Final   Stenotrophomonas maltophilia NOT DETECTED NOT DETECTED Final   Candida albicans NOT DETECTED NOT DETECTED Final   Candida auris NOT DETECTED NOT DETECTED Final   Candida glabrata NOT DETECTED NOT DETECTED Final   Candida krusei NOT DETECTED NOT DETECTED Final   Candida parapsilosis NOT DETECTED NOT DETECTED Final   Candida tropicalis NOT DETECTED NOT DETECTED Final   Cryptococcus neoformans/gattii NOT DETECTED NOT DETECTED Final  Comment: Performed at [facility], [place] [address] [place], [place] [address]  Resp Panel by RT-PCR (Flu A&B, Covid) Anterior Nasal Swab     Status: None   Collection Time: 10/16/21  8:55 AM   Specimen: Anterior Nasal Swab  Result Value Ref Range Status   SARS Coronavirus 2 by RT PCR NEGATIVE NEGATIVE Final    Comment: (NOTE) SARS-CoV-2 target nucleic acids are NOT DETECTED.  The SARS-CoV-2 RNA is generally detectable in upper respiratory specimens during the acute phase of infection. The lowest concentration of SARS-CoV-2 viral copies this assay can detect is 138 copies/mL. A negative result does not preclude SARS-Cov-2 infection and should not be used as the sole basis for treatment or other patient management decisions. A negative result may occur with  improper specimen collection/handling, submission of specimen other than nasopharyngeal swab, presence of viral mutation(s) within the areas targeted by this assay, and inadequate number of viral copies(<138 copies/mL). A negative result must be combined with clinical observations, patient history, and epidemiological information. The expected result is Negative.  Fact Sheet for Patients:  EntrepreneurPulse.com.au  Fact Sheet for Healthcare Providers:  IncredibleEmployment.be  This test is no t yet approved or cleared by the [place] FDA and  has been authorized for detection  and/or diagnosis of SARS-CoV-2 by FDA under an Emergency Use Authorization (EUA). This EUA will remain  in effect (meaning this test can be used) for the duration of the COVID-19 declaration under Section 564(b)(1) of the Act, 21 U.S.C.section 360bbb-3(b)(1), unless the authorization is terminated  or revoked sooner.       Influenza A by PCR NEGATIVE NEGATIVE Final   Influenza B by PCR NEGATIVE NEGATIVE Final    Comment: (NOTE) The Xpert Xpress SARS-CoV-2/FLU/RSV plus assay is intended as an aid in the diagnosis of influenza from Nasopharyngeal swab specimens and should not be used as a sole basis for treatment. Nasal washings and aspirates are unacceptable for Xpert Xpress SARS-CoV-2/FLU/RSV testing.  Fact Sheet for Patients: EntrepreneurPulse.com.au  Fact Sheet for Healthcare Providers: IncredibleEmployment.be  This test is not yet approved or cleared by the [place] FDA and has been authorized for detection and/or diagnosis of SARS-CoV-2 by FDA under an Emergency Use Authorization (EUA). This EUA will remain in effect (meaning this test can be used) for the duration of the COVID-19 declaration under Section 564(b)(1) of the Act, 21 U.S.C. section 360bbb-3(b)(1), unless the authorization is terminated or revoked.  Performed at [facility], [address], [place], [place] [address]   Respiratory (~20 pathogens) panel by PCR     Status: Abnormal   Collection Time: 10/16/21 10:50 AM   Specimen: Nasopharyngeal Swab; Respiratory  Result Value Ref Range Status   Adenovirus NOT DETECTED NOT DETECTED Final   Coronavirus 229E NOT DETECTED NOT DETECTED Final    Comment: (NOTE) The Coronavirus on the Respiratory Panel, DOES NOT test for the novel  Coronavirus (2019 nCoV)    Coronavirus HKU1 NOT DETECTED NOT DETECTED Final   Coronavirus NL63 NOT DETECTED NOT DETECTED Final   Coronavirus OC43 NOT DETECTED NOT DETECTED  Final   Metapneumovirus NOT DETECTED NOT DETECTED Final   Rhinovirus / Enterovirus NOT DETECTED NOT DETECTED Final   Influenza A NOT DETECTED NOT DETECTED Final   Influenza B NOT DETECTED NOT DETECTED Final   Parainfluenza Virus 1 NOT DETECTED NOT DETECTED Final   Parainfluenza Virus 2 NOT DETECTED NOT DETECTED Final   Parainfluenza Virus 3 DETECTED (A) NOT DETECTED Final  Parainfluenza Virus 4 NOT DETECTED NOT DETECTED Final   Respiratory Syncytial Virus NOT DETECTED NOT DETECTED Final   Bordetella pertussis NOT DETECTED NOT DETECTED Final   Bordetella Parapertussis NOT DETECTED NOT DETECTED Final   Chlamydophila pneumoniae NOT DETECTED NOT DETECTED Final   Mycoplasma pneumoniae NOT DETECTED NOT DETECTED Final    Comment: Performed at [facility], [place] [address] [place], [place] [address]  Urine Culture     Status: None   Collection Time: 10/16/21 11:20 AM   Specimen: In/Out Cath Urine  Result Value Ref Range Status   Specimen Description   Final    IN/OUT CATH URINE Performed at [facility], [address], [place], [place] [address]    Special Requests   Final    NONE Performed at [facility], [address] [place], [place] [address]    Culture   Final    NO GROWTH Performed at [facility], [place] [address], [place], [place] [address]    Report Status 10/17/2021 FINAL  Final  Expectorated Sputum Assessment w Gram Stain, Rflx to Resp Cult     Status: None   Collection Time: 10/17/21  4:31 AM   Specimen: Sputum  Result Value Ref Range Status   Specimen Description SPUTUM  Final   Special Requests NONE  Final   Sputum evaluation   Final    THIS SPECIMEN IS ACCEPTABLE FOR SPUTUM CULTURE Performed at [facility], [place] [address] [place], [place] [address]    Report Status 10/17/2021 FINAL  Final  Culture, Respiratory w Gram Stain     Status: None (Preliminary result)   Collection Time: 10/17/21   4:31 AM   Specimen: SPU  Result Value Ref Range Status   Specimen Description   Final    SPUTUM Performed at [place] [address] [place], [place] [address]    Special Requests   Final    NONE Reflexed from [id number] Performed at [facility], [place] [address], [place], [place] [address]    Gram Stain   Final    ABUNDANT WBC PRESENT, PREDOMINANTLY PMN FEW GRAM POSITIVE COCCI FEW GRAM NEGATIVE RODS FEW GRAM POSITIVE RODS Performed at [facility], [place] [address], [place], [place] [address]    Culture PENDING  Incomplete   Report Status PENDING  Incomplete  MRSA Next Gen by PCR, Nasal     Status: None   Collection Time: 10/17/21  5:35 PM   Specimen: Nasal Mucosa; Nasal Swab  Result Value Ref Range Status   MRSA by PCR Next Gen NOT DETECTED NOT DETECTED Final    Comment: (NOTE) The GeneXpert MRSA Assay (FDA approved for NASAL specimens only), is one component of a comprehensive MRSA colonization surveillance program. It is not intended to diagnose MRSA infection nor to guide or monitor treatment for MRSA infections. Test performance is not FDA approved in patients less than 68 years old. Performed at [facility], [place] [address], [place], [place] [address]          Radiology Studies: ECHOCARDIOGRAM COMPLETE  Result Date: 10/17/2021    ECHOCARDIOGRAM REPORT   Patient Name:   BECKAM ABDULAZIZ Date of Exam: 10/17/2021 Medical Rec #:  [id number]         Height:       71.0 in Accession #:    [id number]        Weight:  187.0 lb Date of Birth:  05-27-1955         BSA:          2.049 m Patient Age:    33 years          BP:           160/87 mmHg Patient Gender: M                 HR:           87 bpm. Exam Location:  Inpatient Procedure: 2D Echo, Cardiac Doppler and Color Doppler Indications:    Acute respiratory distress  History:        Patient has prior history of Echocardiogram examinations, most                  recent 11/29/2019. Risk Factors:Diabetes.  Sonographer:    Jefferey Pica Referring Phys: [phone] MURALI RAMASWAMY  Sonographer Comments: Image acquisition challenging due to respiratory motion. IMPRESSIONS  1. Left ventricular ejection fraction, by estimation, is 60 to 65%. The left ventricle has normal function. The left ventricle has no regional wall motion abnormalities. There is mild concentric left ventricular hypertrophy. Left ventricular diastolic parameters were normal.  2. Right ventricular systolic function is low normal. The right ventricular size is mildly enlarged.  3. The mitral valve is normal in structure. No evidence of mitral valve regurgitation. No evidence of mitral stenosis.  4. The aortic valve is normal in structure. Aortic valve regurgitation is not visualized. No aortic stenosis is present.  5. The inferior vena cava is normal in size with greater than 50% respiratory variability, suggesting right atrial pressure of 3 mmHg. FINDINGS  Left Ventricle: Left ventricular ejection fraction, by estimation, is 60 to 65%. The left ventricle has normal function. The left ventricle has no regional wall motion abnormalities. The left ventricular internal cavity size was normal in size. There is  mild concentric left ventricular hypertrophy. Left ventricular diastolic parameters were normal. Right Ventricle: The right ventricular size is mildly enlarged. No increase in right ventricular wall thickness. Right ventricular systolic function is low normal. Left Atrium: Left atrial size was normal in size. Right Atrium: Right atrial size was normal in size. Pericardium: There is no evidence of pericardial effusion. Presence of epicardial fat layer. Mitral Valve: The mitral valve is normal in structure. No evidence of mitral valve regurgitation. No evidence of mitral valve stenosis. Tricuspid Valve: The tricuspid valve is normal in structure. Tricuspid valve regurgitation is not demonstrated. No evidence of  tricuspid stenosis. Aortic Valve: The aortic valve is normal in structure. Aortic valve regurgitation is not visualized. No aortic stenosis is present. Aortic valve peak gradient measures 8.5 mmHg. Pulmonic Valve: The pulmonic valve was normal in structure. Pulmonic valve regurgitation is not visualized. No evidence of pulmonic stenosis. Aorta: The aortic root is normal in size and structure. Venous: The inferior vena cava is normal in size with greater than 50% respiratory variability, suggesting right atrial pressure of 3 mmHg. IAS/Shunts: No atrial level shunt detected by color flow Doppler.  LEFT VENTRICLE PLAX 2D LVIDd:         4.20 cm   Diastology LVIDs:         2.30 cm   LV e' medial:    10.10 cm/s LV PW:         1.00 cm   LV E/e' medial:  5.8 LV IVS:        1.10 cm   LV e' lateral:  12.40 cm/s LVOT diam:     2.00 cm   LV E/e' lateral: 4.7 LV SV:         79 LV SV Index:   39 LVOT Area:     3.14 cm  RIGHT VENTRICLE RV Basal diam:  3.90 cm RV Mid diam:    4.30 cm LEFT ATRIUM             Index        RIGHT ATRIUM           Index LA diam:        4.90 cm 2.39 cm/m   RA Area:     16.50 cm LA Vol (A2C):   39.4 ml 19.23 ml/m  RA Volume:   42.30 ml  20.64 ml/m LA Vol (A4C):   50.3 ml 24.55 ml/m LA Biplane Vol: 44.8 ml 21.86 ml/m  AORTIC VALVE                 PULMONIC VALVE AV Area (Vmax): 2.71 cm     PV Vmax:       1.13 m/s AV Vmax:        146.00 cm/s  PV Peak grad:  5.1 mmHg AV Peak Grad:   8.5 mmHg LVOT Vmax:      126.00 cm/s LVOT Vmean:     77.400 cm/s LVOT VTI:       0.253 m  AORTA Ao Root diam: 3.60 cm Ao Asc diam:  3.50 cm MITRAL VALVE MV Area (PHT): 3.91 cm    SHUNTS MV Decel Time: 194 msec    Systemic VTI:  0.25 m MV E velocity: 58.20 cm/s  Systemic Diam: 2.00 cm MV A velocity: 78.10 cm/s MV E/A ratio:  0.75 Kardie Tobb DO Electronically signed by Berniece Salines DO Signature Date/Time: 10/17/2021/3:21:08 PM    Final    CT Angio Chest Pulmonary Embolism (PE) W or WO Contrast  Result Date:  10/17/2021 CLINICAL DATA:  Chest pain or SOB, pleurisy or effusion suspected EXAM: CT ANGIOGRAPHY CHEST WITH CONTRAST TECHNIQUE: Multidetector CT imaging of the chest was performed using the standard protocol during bolus administration of intravenous contrast. Multiplanar CT image reconstructions and MIPs were obtained to evaluate the vascular anatomy. RADIATION DOSE REDUCTION: This exam was performed according to the departmental dose-optimization program which includes automated exposure control, adjustment of the mA and/or kV according to patient size and/or use of iterative reconstruction technique. CONTRAST:  11m OMNIPAQUE IOHEXOL 350 MG/ML SOLN COMPARISON:  Correlation is made with a chest x-ray dated October 16, 2021 FINDINGS: Cardiovascular: Satisfactory opacification of the pulmonary arteries to the segmental level. No evidence of pulmonary embolism. Normal heart size. No pericardial effusion. Thoracic aorta has a normal appearance. Mild atheromatous calcifications of the arch of the aorta and coronary arteries. Mediastinum/Nodes: No enlarged mediastinal, hilar, or axillary lymph nodes. Thyroid gland, trachea, and esophagus demonstrate no significant findings. Moderate thickening of the distal esophagus. Lungs/Pleura: There are patchy confluent opacities seen at the bilateral mid and lower lung zones and are more prominent at the bilateral lower lobes. There is some consolidation seen at the left lung base. No pleural effusion. Upper Abdomen: No acute abnormality. Musculoskeletal: Mild-to-moderate thoracic spondylosis with prominent anterior marginal osteophytes. Review of the MIP images confirms the above findings. IMPRESSION: There are patchy confluent opacities seen at the bilateral mid and lower lung zones with an area of consolidation at the left lung base. These findings may represent pneumonia with superimposed atelectasis. No pleural effusion.  Moderate circumferential thickening of the distal  esophagus. Electronically Signed   By: Frazier Richards M.D.   On: 10/17/2021 10:41        Scheduled Meds:  allopurinol  300 mg Oral Daily   carvedilol  3.125 mg Oral BID WC   enoxaparin (LOVENOX) injection  40 mg Subcutaneous Q24H   feeding supplement  237 mL Oral BID BM   fluticasone  1 spray Each Nare Daily   HYDROcodone bit-homatropine  5 mL Oral Daily   hydrocortisone sod succinate (SOLU-CORTEF) inj  100 mg Intravenous Q12H   insulin aspart  0-15 Units Subcutaneous Q4H   insulin glargine-yfgn  15 Units Subcutaneous Daily   ipratropium-albuterol  3 mL Nebulization BID   loratadine  10 mg Oral Daily   pantoprazole  40 mg Oral Daily   potassium chloride  40 mEq Oral Once   sodium chloride flush  3 mL Intravenous Q12H   Continuous Infusions:  cefTRIAXone (ROCEPHIN)  IV 2 g (10/18/21 0539)     LOS: 2 days    Time spent: 35 minutes.     Elmarie Shiley, MD [facility]   If 7PM-7AM, please contact night-coverage [email or web address]  10/18/2021, 10:04 AM

## 2021-10-18 NOTE — Progress Notes (Signed)
PHARMACY - PHYSICIAN COMMUNICATION CRITICAL VALUE ALERT - BLOOD CULTURE IDENTIFICATION (BCID)  Joseph Powell is an 66 y.o. male who presented to [facility] on 10/16/2021 with a chief complaint of sepsis  Assessment:  GNR, 1/4, haemophilus influenzae  Name of physician (or Provider) Contacted: K Foust Current antibiotics: cefepime and doxy  Changes to prescribed antibiotics recommended:  Stop cefepime and  start CTX 2gm IV q24h  Results for orders placed or performed during the hospital encounter of 10/16/21  Blood Culture ID Panel (Reflexed) (Collected: 10/16/2021  8:13 AM)  Result Value Ref Range   Enterococcus faecalis NOT DETECTED NOT DETECTED   Enterococcus Faecium NOT DETECTED NOT DETECTED   Listeria monocytogenes NOT DETECTED NOT DETECTED   Staphylococcus species NOT DETECTED NOT DETECTED   Staphylococcus aureus (BCID) NOT DETECTED NOT DETECTED   Staphylococcus epidermidis NOT DETECTED NOT DETECTED   Staphylococcus lugdunensis NOT DETECTED NOT DETECTED   Streptococcus species NOT DETECTED NOT DETECTED   Streptococcus agalactiae NOT DETECTED NOT DETECTED   Streptococcus pneumoniae NOT DETECTED NOT DETECTED   Streptococcus pyogenes NOT DETECTED NOT DETECTED   A.calcoaceticus-baumannii NOT DETECTED NOT DETECTED   Bacteroides fragilis NOT DETECTED NOT DETECTED   Enterobacterales NOT DETECTED NOT DETECTED   Enterobacter cloacae complex NOT DETECTED NOT DETECTED   Escherichia coli NOT DETECTED NOT DETECTED   Klebsiella aerogenes NOT DETECTED NOT DETECTED   Klebsiella oxytoca NOT DETECTED NOT DETECTED   Klebsiella pneumoniae NOT DETECTED NOT DETECTED   Proteus species NOT DETECTED NOT DETECTED   Salmonella species NOT DETECTED NOT DETECTED   Serratia marcescens NOT DETECTED NOT DETECTED   Haemophilus influenzae DETECTED (A) NOT DETECTED   Neisseria meningitidis NOT DETECTED NOT DETECTED   Pseudomonas aeruginosa NOT DETECTED NOT DETECTED   Stenotrophomonas maltophilia NOT  DETECTED NOT DETECTED   Candida albicans NOT DETECTED NOT DETECTED   Candida auris NOT DETECTED NOT DETECTED   Candida glabrata NOT DETECTED NOT DETECTED   Candida krusei NOT DETECTED NOT DETECTED   Candida parapsilosis NOT DETECTED NOT DETECTED   Candida tropicalis NOT DETECTED NOT DETECTED   Cryptococcus neoformans/gattii NOT DETECTED NOT DETECTED    Dolly Rias RPh 10/18/2021, 3:47 AM

## 2021-10-18 NOTE — Inpatient Diabetes Management (Signed)
Inpatient Diabetes Program Recommendations  AACE/ADA: New Consensus Statement on Inpatient Glycemic Control (2015)  Target Ranges:  Prepandial:   less than 140 mg/dL      Peak postprandial:   less than 180 mg/dL (1-2 hours)      Critically ill patients:  140 - 180 mg/dL   Lab Results  Component Value Date   GLUCAP 241 (H) 10/18/2021   HGBA1C 6.6 (H) 10/17/2021    Review of Glycemic Control  Diabetes history: No prior dx Current orders for Inpatient glycemic control: Semglee 15 units qd, Novolog 0-15 units tid, Solucortef decreased to 100 mg q 12 hrs.  Inpatient Diabetes Program Recommendations:   A1c 6.6 without prior dx DM2. Secure chat to Dr. Samantha Crimes regarding ? Address new onset DM. Will follow during hospitalization and assist as needed.  Thank you, Nani Gasser. Ofilia Rayon, RN, MSN, CDE  Diabetes Coordinator Inpatient Glycemic Control Team Team Pager [phone] (8am-5pm) 10/18/2021 9:47 AM

## 2021-10-18 NOTE — Progress Notes (Signed)
eLink Physician-Brief Progress Note Patient Name: CARLETON VANVALKENBURGH DOB: [birth year] MRN: [id number]   Date of Service  10/18/2021  HPI/Events of Note  Patient did not tolerate trial of BIPAP tonight despite being placed on the same settings he was on during the day, he became extremely anxious and his respiratory rate went into the 30's, when taken off BIPAP and placed on [place] he became very relaxed and his respiratory rate dropped down to 18.  eICU Interventions  Will leave patient on [place] for the balance of the night, and Dr. Bobbe Medico can re-evaluate in the AM.        Kerry Kass Klaus Casteneda 10/18/2021, 12:27 AM

## 2021-10-19 DIAGNOSIS — J189 Pneumonia, unspecified organism: Secondary | ICD-10-CM | POA: Diagnosis not present

## 2021-10-19 DIAGNOSIS — A419 Sepsis, unspecified organism: Secondary | ICD-10-CM | POA: Diagnosis not present

## 2021-10-19 DIAGNOSIS — J9601 Acute respiratory failure with hypoxia: Secondary | ICD-10-CM | POA: Diagnosis not present

## 2021-10-19 LAB — CBC
HCT: 41.5 % (ref 39.0–52.0)
Hemoglobin: 13.5 g/dL (ref 13.0–17.0)
MCH: 31.8 pg (ref 26.0–34.0)
MCHC: 32.5 g/dL (ref 30.0–36.0)
MCV: 97.6 fL (ref 80.0–100.0)
Platelets: 160 10*3/uL (ref 150–400)
RBC: 4.25 MIL/uL (ref 4.22–5.81)
RDW: 13.8 % (ref 11.5–15.5)
WBC: 20.6 10*3/uL — ABNORMAL HIGH (ref 4.0–10.5)
nRBC: 0 % (ref 0.0–0.2)

## 2021-10-19 LAB — BASIC METABOLIC PANEL
Anion gap: 9 (ref 5–15)
BUN: 12 mg/dL (ref 8–23)
CO2: 29 mmol/L (ref 22–32)
Calcium: 9.2 mg/dL (ref 8.9–10.3)
Chloride: 103 mmol/L (ref 98–111)
Creatinine, Ser: 0.7 mg/dL (ref 0.61–1.24)
GFR, Estimated: >60 mL/min (ref >60)
Glucose, Bld: 138 mg/dL — ABNORMAL HIGH (ref 70–99)
Potassium: 4 mmol/L (ref 3.5–5.1)
Sodium: 141 mmol/L (ref 135–145)

## 2021-10-19 LAB — PROCALCITONIN: Procalcitonin: <0.1 ng/mL

## 2021-10-19 LAB — CULTURE, RESPIRATORY W GRAM STAIN: Culture: NORMAL

## 2021-10-19 LAB — GLUCOSE, CAPILLARY
Glucose-Capillary: 177 mg/dL — ABNORMAL HIGH (ref 70–99)
Glucose-Capillary: 143 mg/dL — ABNORMAL HIGH (ref 70–99)
Glucose-Capillary: 113 mg/dL — ABNORMAL HIGH (ref 70–99)
Glucose-Capillary: 185 mg/dL — ABNORMAL HIGH (ref 70–99)
Glucose-Capillary: 156 mg/dL — ABNORMAL HIGH (ref 70–99)

## 2021-10-19 LAB — MAGNESIUM: Magnesium: 2.2 mg/dL (ref 1.7–2.4)

## 2021-10-19 LAB — RHEUMATOID FACTOR: Rheumatoid fact SerPl-aCnc: 13.9 IU/mL (ref <14.0)

## 2021-10-19 MED ORDER — SODIUM CHLORIDE 3 % IN NEBU
4.0000 mL | INHALATION_SOLUTION | Freq: Two times a day (BID) | RESPIRATORY_TRACT | Status: AC
Start: 2021-10-19 — End: 2021-10-22
  Administered 2021-10-19 – 2021-10-22 (×6): 4 mL via RESPIRATORY_TRACT
  Filled 2021-10-19 (×6): qty 4

## 2021-10-19 MED ORDER — OXYMETAZOLINE HCL 0.05 % NA SOLN
1.0000 | Freq: Two times a day (BID) | NASAL | Status: AC
Start: 2021-10-19 — End: 2021-10-22
  Administered 2021-10-19 – 2021-10-22 (×6): 1 via NASAL
  Filled 2021-10-19: qty 15

## 2021-10-19 MED ORDER — HYDROCODONE BIT-HOMATROP MBR 5-1.5 MG/5ML PO SOLN
5.0000 mL | Freq: Two times a day (BID) | ORAL | Status: DC | PRN
  Administered 2021-10-19 – 2021-10-20 (×2): 5 mL via ORAL
  Filled 2021-10-19 (×2): qty 5

## 2021-10-19 MED ORDER — LIP MEDEX EX OINT
TOPICAL_OINTMENT | CUTANEOUS | Status: DC | PRN
  Administered 2021-10-19: 75 via TOPICAL
  Filled 2021-10-19: qty 7

## 2021-10-19 NOTE — Progress Notes (Signed)
    [facility] for Infectious Disease   Reason for visit: Follow up on bacteremia  Interval History: no new culture growth.  No fever, WBC 20.6.  remains hypoxic. H flu beta lactam negative.   Physical Exam: Constitutional:  Vitals:   10/19/21 0726 10/19/21 0854  BP:  (!) 162/94  Pulse:  76  Resp:    Temp:    SpO2: 100%    patient appears in NAD  Impression: pneumonia.  Viral and possible H flu and on ceftriaxone.    Plan: 1.  Continue ceftriaxone.

## 2021-10-19 NOTE — Evaluation (Signed)
Physical Therapy Evaluation Patient Details Name: Joseph Powell MRN: [id number] DOB: 27-Jul-1955 Today's Date: 10/19/2021  History of Present Illness  66 year old with past medical history significant for lymphoma diagnosed 2018, had stem cell transplant 2019, has done well since that time.  Pt admitted 10/16/21 with hypoxia and found to have Haemophilus Influenzae  Bacteremia, para-influenza 3. Bacterial PNA.  PMHx: Allergic rhinitis, Asthma, Bell's palsy, GERD (gastroesophageal reflux disease), Gout, Hypercholesteremia, Melanoma, Osteoarthritis, Sleep apnea, Thrombocytopenia, and Tubular adenoma  Clinical Impression  Pt admitted with above diagnosis.  Pt currently with functional limitations due to the deficits listed below (see PT Problem List). Pt will benefit from skilled PT to increase their independence and safety with mobility to allow discharge to the venue listed below.  Pt able to tolerate standing and marching in place for approx one minute, currently on 20 L HHFNC 40%.  Pt's SPO2 100% during marching and only dropped to 90% upon sitting in recliner.  Pt has 6-9 steps to enter home and at least a flight to perform in home.  Depending on progress, pt may benefit from OPPT upon d/c.        Recommendations for follow up therapy are one component of a multi-disciplinary discharge planning process, led by the attending physician.  Recommendations may be updated based on patient status, additional functional criteria and insurance authorization.  Follow Up Recommendations Outpatient PT    Assistance Recommended at Discharge PRN  Patient can return home with the following  Help with stairs or ramp for entrance;Assistance with cooking/housework    Equipment Recommendations None recommended by PT  Recommendations for Other Services       Functional Status Assessment Patient has had a recent decline in their functional status and demonstrates the ability to make significant improvements  in function in a reasonable and predictable amount of time.     Precautions / Restrictions Precautions Precaution Comments: currently requiring 20L HHFNC 40%; only uses CPAP at night at baseline      Mobility  Bed Mobility Overal bed mobility: Needs Assistance Bed Mobility: Supine to Sit     Supine to sit: Supervision, HOB elevated     General bed mobility comments: supervision for lines    Transfers Overall transfer level: Needs assistance Equipment used: Rolling walker (2 wheels) Transfers: Sit to/from Stand, Bed to chair/wheelchair/BSC Sit to Stand: Min guard   Step pivot transfers: Min guard       General transfer comment: pt had RW available however did not need, denies dyspnea and dizziness, Pt performed marching in place for 1 minutes then transferred to recliner, SPO2 100% on 20L HHFNC and only dropped to 90% upon sitting in recliner    Ambulation/Gait               General Gait Details: limited by [facility] and cautious first time OOB  Stairs            Wheelchair Mobility    Modified Rankin (Stroke Patients Only)       Balance Overall balance assessment: No apparent balance deficits (not formally assessed)                                           Pertinent Vitals/Pain Pain Assessment Pain Assessment: No/denies pain    Home Living Family/patient expects to be discharged to:: Private residence Living Arrangements: Spouse/significant other   Type of  Home: House Home Access: Stairs to enter Entrance Stairs-Rails: Right Entrance Stairs-Number of Steps: 6-9 Alternate Level Stairs-Number of Steps: flight Home Layout: Two level Home Equipment: Cane - single point      Prior Function Prior Level of Function : Independent/Modified Independent                     Hand Dominance        Extremity/Trunk Assessment        Lower Extremity Assessment Lower Extremity Assessment: Generalized weakness     Cervical / Trunk Assessment Cervical / Trunk Assessment: Normal  Communication   Communication: No difficulties  Cognition Arousal/Alertness: Awake/alert Behavior During Therapy: WFL for tasks assessed/performed Overall Cognitive Status: Within Functional Limits for tasks assessed                                 General Comments: HOH        General Comments      Exercises     Assessment/Plan    PT Assessment Patient needs continued PT services  PT Problem List Cardiopulmonary status limiting activity;Decreased knowledge of use of DME;Decreased mobility       PT Treatment Interventions Functional mobility training;Therapeutic activities;DME instruction;Gait training;Stair training;Patient/family education    PT Goals (Current goals can be found in the Care Plan section)  Acute Rehab PT Goals PT Goal Formulation: With patient Time For Goal Achievement: 11/02/21 Potential to Achieve Goals: Good    Frequency       Co-evaluation               AM-PAC PT "6 Clicks" Mobility  Outcome Measure Help needed turning from your back to your side while in a flat bed without using bedrails?: None Help needed moving from lying on your back to sitting on the side of a flat bed without using bedrails?: None Help needed moving to and from a bed to a chair (including a wheelchair)?: A Little Help needed standing up from a chair using your arms (e.g., wheelchair or bedside chair)?: A Little Help needed to walk in hospital room?: A Little Help needed climbing 3-5 steps with a railing? : A Lot 6 Click Score: 19    End of Session Equipment Utilized During Treatment: Oxygen Activity Tolerance: Patient tolerated treatment well Patient left: in chair;with call bell/phone within reach;with chair alarm set;with family/visitor present Nurse Communication: Mobility status PT Visit Diagnosis: Other abnormalities of gait and mobility (R26.89)    Time: 5916-3846 PT Time  Calculation (min) (ACUTE ONLY): 14 min   Charges:   PT Evaluation $PT Eval Low Complexity: 1 Low         Kati PT, DPT Acute Rehabilitation Services Pager: [phone] Office: [phone]   [name] 10/19/2021, 3:06 PM

## 2021-10-19 NOTE — Progress Notes (Signed)
 NAME:  Joseph Powell, MRN:  [id number], DOB:  12/29/1955, LOS: 3 ADMISSION DATE:  10/16/2021, CONSULTATION DATE:  10/17/21 REFERRING MD:  Dr REgalado, CHIEF COMPLAINT:  Acute hypoxemic resp virust - PArainfleunza 3    BRIEF  -66-year-old male who is status post [facility] and stem cell transplant for history of lymphoma T-cell non-Hodgkin's believed to be in complete remission in 2019.  He follows with Dr. Michael Wert for chronic cough asthma and allergies.  2018 had normal spirometry and allergy profile in September 2022 with normal IgE and no eosinophils in 2023 exhaled nitric oxide was normal.  Most recent chest x-ray was in April 2023 with peribronchial thickening but no infiltrates [in 2019 did have a CT scan of the chest that had a left lower lobe infiltrate] most recent pulmonary clinic follow-up April 2023 showed improvement in cough with short course prednisone.  Consider this cough variant asthma  Now in the middle of May 2023 he went on a remote close and also travel to [place] in [place] in [place].  1 week into this day because of lack of air conditioning a left is with the room open and had a fan blowing into his face.  Shortly after that he and his wife got sick.  This was around May 27/28 2023.  Then on October 03, 2021 he was evaluated by a physician and then again also on October 08, 2021.  At no point his pulse ox was checked.  He was given azithromycin.  His wife recovered with the same antibiotic but Sumayya starting her on October 08, 2021 was actually getting short of breath.  He [place] back [place] the [place] on [place] 01, 2021.  By this time he felt like he was hypoxemic and also short of breath with minimal exertion and having significant cough.  Presented to droppage emergency room 10/16/2021 found to be acute hypoxemic respiratory failure.  Had bilateral lower lobe pulmonary infiltrates.  White count was elevated.  Started on Antibiotics but procalcitonin normal.  Also given fluids.   If progressive worsening hypoxemia in the emergency department admitted to progressive unit at [place] [facility].  Pulmonary critical care consulted morning of 10/17/2021 requiring 25 L high flow nasal cannula.  Desaturations with cough noted.  Some associated malaise and poor appetite.  No chest pains.  The cough is dry.  No edema no hemoptysis.  CT angiogram dated, troponin, D-dimer, echo data not available.  No urine strep or Legionella.  He is positive for parainfluenza virus 3.   Past Medical History:    has a past medical history of Allergic rhinitis, Asthma, Bell's palsy, GERD (gastroesophageal reflux disease), Gout, Hypercholesteremia, Melanoma (HCC), Osteoarthritis, Sleep apnea, Thrombocytopenia (HCC), and Tubular adenoma.   has a past surgical history that includes Knee surgery (1993); Vasectomy (1999); Nasal reconstruction; Melanoma excision; IR US Guide Vasc Access Right (02/17/2017); IR FLUORO GUIDE PORT INSERTION RIGHT (02/17/2017); and IR REMOVAL TUN ACCESS W/ PORT W/O FL MOD SED (01/05/2018).    Significant Hospital Events:  10/16/2021 - admit. PIV 3+ 6/15 - ccm consut. 25L . STart BiPAP x 2 in day with QHS and IV Steroid Solumedrol  - CTA - neg PE  - BCID /14 wirh H Flu  - QUant gold -    - Urine strep - neg  - coviud IgG - neg  = HIV neg  - PCT < 0.1  - ESR 32  - ECHO - LVEF 65%. Normal DDx. Mild reduced RV   Function  - BCID - H Flu  6/16- di dnot toelrated BiPAP last night.  He says nurses thought he was anxious. He denies it. Says the BiPAP produced no air. He is willing to try BiPAP this AM. REmains on 25L HFNC . BCID with H Flu. Abx being narrowed to ceftriaxone. Coughs and when sat our of bed on 25L [place] - desat to 85%-88%.  Movements produce cough.  START 219m per day IV hydrocort  SUBJECTIVE/OVERNIGHT/INTERVAL HX    6/17 - Used BiPAP last night. BP high. On hydrocort 2052mper day for CAP (D3 of steroids, D2 hydrocor), Now on 20L HFNC with 40% fio2. AFebrile.   And by Afetrnoon down to 15L [place] -> in room turned down to 30% fio2/10L an dstill pulse ox 99%. Coughed and did not desat   Wife reports  - HOH since illness started. On flonase. ENT consult pending per her  - Low appetite   Objective   Blood pressure (!) 162/94, pulse 76, temperature 97.7 F (36.5 C), temperature source Oral, resp. rate (!) 24, height 5' 11" (1.803 m), weight 84.8 kg, SpO2 98 %.    FiO2 (%):  [40 %] 40 %   Intake/Output Summary (Last 24 hours) at 10/19/2021 1515 Last data filed at 10/19/2021 1011 Gross per 24 hour  Intake 291.4 ml  Output 900 ml  Net -608.6 ml   Filed Weights   10/16/21 0800  Weight: 84.8 kg    Examination: General Appearance:  Looks same. Sleeping and then work up Head:  Normocephalic, without obvious abnormality, atraumatic Eyes:  PERRL - yes, conjunctiva/corneas - mydd     Ears:  Normal external ear canals, both ears Nose:  G tube - o2 on Throat:  ETT TUBE - no , OG tube - no Neck:  Supple,  No enlargement/tenderness/nodules Lungs: Clear to auscultation bilaterally in front. No distress. Normal WOB. No crackles atneiroluy Heart:  S1 and S2 normal, no murmur, CVP - no.  Pressors - no Abdomen:  Soft, no masses, no organomegaly Genitalia / Rectal:  Not done Extremities:  Extremities- intact Skin:  ntact in exposed areas . Sacral area - not examined Neurologic:  Sedation - none -> RASS - +! . Marland Kitchenoves all 4s - yes. CAM-ICU - neg . Orientation - x3+ HOG +      X  Assessment & Plan:  ASSESSMENT / PLAN:  PULMONARY  A:  Has history of chronic cough secondary to upper airway cough syndrome [.  Chest x-ray in April 2023 but no prior CT scan chest]  -No known ILD   Acute hypoxic respiratory failure with associated pulmonary infiltrates/ALOI - Present on Admit -  due to parainfluenza virus 3 and secondary CAP H Flu in BCID -requiring 25 L nasal cannula high flow - Present on Admit - Severe cough and desaturation secondary to the  above   10/19/2021 -> rapidly improved - down to 30% fio2 and 10L [place]. Tolerating BiPAP  P:   [place]ot[place]u o2 for pusle ox > 90%   BiPAP 2-4 hours DDDAY TIME cycles x 1 (can reduce)in the daytime and nightly Continue  HYDROCORT (NEJM 2023 data for CAP) for D2/5 days at 20053maily and then taper over nanother 5 days  Await autoimme   Cough suppression with hycodan to continue  Add 3% saline neb for cough and sputum clearnce  CAP Rx with Ceftriaxone  NAsal afrin to open up nasal passages  Move to ICU if worse (getting better currently)  Best practice (daily eval):  According to the hospitalist  Goals of Care:   Multi-Disciplinary Goals of Care Discussion    Family Updates:  6/15 and 6/16  Full code.  He and his wife updated at the bedside.  Also spoke to the hospitalist.  Anticipated recovery can be days to weeks.  6/17 - wife updated       SIGNATURE    Dr. Brand Males, M.D., F.C.C.P,  Pulmonary and Critical Care Medicine Staff Physician, [place] Director - Interstitial Lung Disease  Program  Pulmonary [place] at [place], [place], [address]  NPI Number:  NPI #[id number]  Pager: [phone], If no answer  -> Check AMION or Try [phone] Telephone (clinical office): [phone] Telephone (research): [phone]  3:15 PM 10/19/2021   10/19/2021 3:15 PM    LABS    PULMONARY Recent Labs  Lab 10/17/21 0539  HCO3 29.2*  O2SAT 52.7    CBC Recent Labs  Lab 10/17/21 0736 10/18/21 0735 10/19/21 0718  HGB 12.1* 13.4 13.5  HCT 37.0* 40.4 41.5  WBC 17.7* 13.0* 20.6*  PLT 125* 159 160    COAGULATION Recent Labs  Lab 10/16/21 0813  INR 1.3*    CARDIAC  No results for input(s): "TROPONINI" in the last 168 hours. No results for input(s): "PROBNP" in the last 168 hours.  CHEMISTRY Recent Labs  Lab 10/16/21 0813 10/16/21 0855 10/17/21 0507 10/18/21 0735  10/19/21 0718  NA 136  --  139 141 141  K 3.2*  --  3.8 3.4* 4.0  CL 95*  --  103 102 103  CO2 22  --  _0 GLUCOSE 142*  --  152* 229* 138*  BUN 9  --  6* 10 12  CREATININE 0.63  --  0.55* 0.66 0.70  CALCIUM 9.5  --  8.1* 8.8* 9.2  MG  --  1.7 1.9 2.2 2.2  PHOS  --  3.1  --  3.0  --    Estimated Creatinine Clearance: 96.7 mL/min (by C-G formula based on SCr of 0.7 mg/dL).   LIVER Recent Labs  Lab 10/16/21 0813 10/17/21 0507  AST 50* 43*  ALT 36 33  ALKPHOS 65 55  BILITOT 1.0 0.7  PROT 6.1* 5.2*  ALBUMIN 3.6 2.6*  INR 1.3*  --      INFECTIOUS Recent Labs  Lab 10/16/21 0827 10/16/21 0958 10/17/21 0507 10/18/21 0735 10/19/21 0537  LATICACIDVEN 1.5 1.5  --   --   --   PROCALCITON  --   --  <0.10 <0.10 <0.10     ENDOCRINE CBG (last 3)  Recent Labs    10/19/21 0437 10/19/21 0739 10/19/21 1144  GLUCAP 177* 143* 113*         IMAGING x48h  - image(s) personally visualized  -   highlighted in bold VAS [place] LOWER EXTREMITY VENOUS (DVT)  Result Date: 10/19/2021  Lower Venous DVT Study Patient Name:  Joseph Powell  Date of Exam:   10/18/2021 Medical Rec #: [id number]          Accession #:    [id number] Date of Birth: Jan 12, 1956          Patient Gender: M Patient Age:   52 years Exam Location:  [facility] Procedure:      VAS [place] LOWER EXTREMITY VENOUS (DVT) Referring Phys: Jerald Kief REGALADO --------------------------------------------------------------------------------  Indications: Edema.  Risk Factors: None  identified. Comparison Study: No prior studies. Performing Technologist: Gregory Collins RVT  Examination Guidelines: A complete evaluation includes B-mode imaging, spectral Doppler, color Doppler, and power Doppler as needed of all accessible portions of each vessel. Bilateral testing is considered an integral part of a complete examination. Limited examinations for reoccurring indications may be performed as noted. The reflux portion of the exam is  performed with the patient in reverse Trendelenburg.  +---------+---------------+---------+-----------+----------+--------------+ RIGHT    CompressibilityPhasicitySpontaneityPropertiesThrombus Aging +---------+---------------+---------+-----------+----------+--------------+ CFV      Full           Yes      Yes                                 +---------+---------------+---------+-----------+----------+--------------+ SFJ      Full                                                        +---------+---------------+---------+-----------+----------+--------------+ FV Prox  Full                                                        +---------+---------------+---------+-----------+----------+--------------+ FV Mid   Full                                                        +---------+---------------+---------+-----------+----------+--------------+ FV DistalFull                                                        +---------+---------------+---------+-----------+----------+--------------+ PFV      Full                                                        +---------+---------------+---------+-----------+----------+--------------+ POP      Full           Yes      Yes                                 +---------+---------------+---------+-----------+----------+--------------+ PTV      Full                                                        +---------+---------------+---------+-----------+----------+--------------+ PERO     Full                                                        +---------+---------------+---------+-----------+----------+--------------+   +---------+---------------+---------+-----------+----------+--------------+   LEFT     CompressibilityPhasicitySpontaneityPropertiesThrombus Aging +---------+---------------+---------+-----------+----------+--------------+ CFV      Full           Yes      Yes                                  +---------+---------------+---------+-----------+----------+--------------+ SFJ      Full                                                        +---------+---------------+---------+-----------+----------+--------------+ FV Prox  Full                                                        +---------+---------------+---------+-----------+----------+--------------+ FV Mid   Full                                                        +---------+---------------+---------+-----------+----------+--------------+ FV DistalFull                                                        +---------+---------------+---------+-----------+----------+--------------+ PFV      Full                                                        +---------+---------------+---------+-----------+----------+--------------+ POP      Full           Yes      Yes                                 +---------+---------------+---------+-----------+----------+--------------+ PTV      Full                                                        +---------+---------------+---------+-----------+----------+--------------+ PERO     Full                                                        +---------+---------------+---------+-----------+----------+--------------+     Summary: RIGHT: - There is no evidence of deep vein thrombosis in the lower extremity.  - No cystic structure found in the popliteal fossa.  LEFT: - There is no evidence of deep vein thrombosis in the lower extremity.  - No  cystic structure found in the popliteal fossa.  *See table(s) above for measurements and observations. Electronically signed by Thomas Hawken on 10/19/2021 at 12:09:04 PM.    Final    

## 2021-10-19 NOTE — Progress Notes (Signed)
PROGRESS NOTE    Joseph Powell  OQH:[id number] DOB: 09-09-1955 DOA: 10/16/2021 PCP: Faustino Congress, NP   Brief Narrative: 66 year old with past medical history significant for lymphoma diagnosed 2018, had stem cell transplant 2019, has done well since that time.  Followed by oncologist at [facility], last evaluation August 16, 2021, patient is in remission.  Patient present with worsening shortness of breath, and fevers.  He just returned from a trip from [place], 6/13. He developed shortness of breath and congestion, June first.  During these illness he received azithromycin initially for 3 days then subsequently extended to 7 days.  He presents with worsening shortness of breath, cough.    He was found to be hypoxic oxygen saturation 86 on room air, he required up to 9 L high flow oxygen. Chest x ray: Background pattern of scattered pulmonary scaring.  Superimposed acute bronchial thickening and patchy pulmonary infiltrates in the mid and lower lungs consistent with bronchopneumonia.  Patient found to have Haemophilus Influenzae  Bacteremia, para-influenza 3. Bacterial PNA. Acute hypoxic respiratory failure.    Assessment & Plan:   Principal Problem:   Sepsis due to pneumonia [facility]) Active Problems:   Pneumonia of both lungs due to infectious organism   Acute respiratory failure with hypoxia ([place])   Type 2 diabetes mellitus without complication, without long-term current use of insulin (HCC)   Cough variant asthma vs UACS   Hypokalemia   GERD without esophagitis   Gout   Malnutrition of moderate degree  1-Sepsis secondary to pneumonia: Haemophilus Influenzae Bacteremia  Patient presents with acute hypoxic respiratory failure, leukocytosis, tachypnea,tachycardia/ in setting of PNA, parainfluenza, concern for superimpose bacterial infection.  -Parainfluenza 3 Positive.  -Blood culture. Haemophilus Influenzae -Continue with IV ceftriaxone.  -Follow sputum culture: rare respiratory  flora, legionella antigen pending, strep pneumonia negative. MRSA PCR> negative -CTA negative for PE>  -ID consulted. Recommended continue with IV ceftriaxone.  -WBC up to 20--suspect increase today related to steroids.   Acute hypoxic respiratory failure secondary to pneumonia, : Bilateral Pneumonia; Parainfluenza and Haemophilus Influenzae PNA Patent requiring HFNC Oxygen currently at 25 L 50 %  CCM consulted. ANA negative, RF: 13. Cyclic Citruli pending.  Continue with nebulizer.  Continue with IV Ceftriaxone.  Elevation BNP-- ECHO normal EF, Low normal RV function in setting of PNA>  Continue with IV Hydrocortisone for 5 days, then will need taper.   Diabetes type 2: SSI.  Started  Long acting insulin while on steroids.  A1c 6.6. Suspect elevated CBG in setting of steroids.   Cough variant asthma: Continue with nebulizer.   Hypokalemia: Replaced.  Suspect Otitis/Hearing loss; suspect related to viral infection/Haemophilus influenzae. . Claritin/  Hearing loss persist. Will get ENT consultation.   Gout: Continue with Allopurinol.   Moderate circumferential thickening of the distal esophagus by CT; Started PPI. Needs follow up when he recovers from PNA with gastroenterologist.   HTN; continue with Carvedilol. Started Norvasc. [place] Hydralazine.   Elevated D dimer: CTA negative for PE. Doppler LE; Negative for DVT.  Nutrition; poor appetite.  Started ensure.  Nutrition consultation.   Estimated body mass index is 26.08 kg/m as calculated from the following:   Height as of this encounter: '5\' 11"'$  (1.803 m).   Weight as of this encounter: 84.8 kg.   DVT prophylaxis: Lovenox Code Status: Full code Family Communication: Wife at bedside.  Disposition Plan:  Status is: Inpatient Remains inpatient appropriate because: remain inpatient for management of Resp failure.     Consultants:  CCM  Procedures:  ECHO pending  Antimicrobials:  Cefepime, Doxy   Subjective: He  was able to tolerate BIPAP last night and yesterday.  He denies worsening dyspnea, if he is sitting in bed. Report SOB when he has coughing spell. Coughing spell not as frequent.  He report difficulty hearing, is hard to hear with background noise. Feels hearing like in tunnel. Difficulty hearing since he got sick, no prior  audition problems.   Objective: Vitals:   10/19/21 0332 10/19/21 0447 10/19/21 0726 10/19/21 0854  BP: 134/89 (!) 152/99  (!) 162/94  Pulse: 70   76  Resp: (!) 23 (!) 24    Temp:  97.7 F (36.5 C)    TempSrc:  Oral    SpO2: 98% 97% 100%   Weight:      Height:        Intake/Output Summary (Last 24 hours) at 10/19/2021 1049 Last data filed at 10/19/2021 1011 Gross per 24 hour  Intake 451.4 ml  Output 1300 ml  Net -848.6 ml    Filed Weights   10/16/21 0800  Weight: 84.8 kg    Examination:  General exam: NAD Respiratory system: BL air movement no wheezing.  Cardiovascular system: S 1, S 2 RRR Gastrointestinal system: BS present, soft, nt Central nervous system: Alert, follows command.  Extremities: No edema    Data Reviewed: I have personally reviewed following labs and imaging studies  CBC: Recent Labs  Lab 10/16/21 0813 10/17/21 0736 10/18/21 0735 10/19/21 0718  WBC 29.5* 17.7* 13.0* 20.6*  NEUTROABS 21.1* 12.2* 10.1*  --   HGB 13.5 12.1* 13.4 13.5  HCT 39.8 37.0* 40.4 41.5  MCV 92.1 97.1 96.4 97.6  PLT 150 125* 159 765    Basic Metabolic Panel: Recent Labs  Lab 10/16/21 0813 10/16/21 0855 10/17/21 0507 10/18/21 0735 10/19/21 0718  NA 136  --  139 141 141  K 3.2*  --  3.8 3.4* 4.0  CL 95*  --  103 102 103  CO2 22  --  '26 29 29  '$ GLUCOSE 142*  --  152* 229* 138*  BUN 9  --  6* 10 12  CREATININE 0.63  --  0.55* 0.66 0.70  CALCIUM 9.5  --  8.1* 8.8* 9.2  MG  --  1.7 1.9 2.2 2.2  PHOS  --  3.1  --  3.0  --     GFR: Estimated Creatinine Clearance: 96.7 mL/min (by C-G formula based on SCr of 0.7 mg/dL). Liver Function  Tests: Recent Labs  Lab 10/16/21 0813 10/17/21 0507  AST 50* 43*  ALT 36 33  ALKPHOS 65 55  BILITOT 1.0 0.7  PROT 6.1* 5.2*  ALBUMIN 3.6 2.6*    No results for input(s): "LIPASE", "AMYLASE" in the last 168 hours. No results for input(s): "AMMONIA" in the last 168 hours. Coagulation Profile: Recent Labs  Lab 10/16/21 0813  INR 1.3*    Cardiac Enzymes: No results for input(s): "CKTOTAL", "CKMB", "CKMBINDEX", "TROPONINI" in the last 168 hours. BNP (last 3 results) No results for input(s): "PROBNP" in the last 8760 hours. HbA1C: Recent Labs    10/17/21 0507  HGBA1C 6.6*    CBG: Recent Labs  Lab 10/18/21 1641 10/18/21 1949 10/18/21 2341 10/19/21 0437 10/19/21 0739  GLUCAP 208* 179* 190* 177* 143*    Lipid Profile: No results for input(s): "CHOL", "HDL", "LDLCALC", "TRIG", "CHOLHDL", "LDLDIRECT" in the last 72 hours. Thyroid Function Tests: No results for input(s): "TSH", "T4TOTAL", "FREET4", "T3FREE", "THYROIDAB" in  the last 72 hours. Anemia Panel: No results for input(s): "VITAMINB12", "FOLATE", "FERRITIN", "TIBC", "IRON", "RETICCTPCT" in the last 72 hours. Sepsis Labs: Recent Labs  Lab 10/16/21 0827 10/16/21 0958 10/17/21 0507 10/18/21 0735 10/19/21 0537  PROCALCITON  --   --  <0.10 <0.10 <0.10  LATICACIDVEN 1.5 1.5  --   --   --      Recent Results (from the past 240 hour(s))  Culture, blood (Routine x 2)     Status: None (Preliminary result)   Collection Time: 10/16/21  8:13 AM   Specimen: BLOOD LEFT ARM  Result Value Ref Range Status   Specimen Description   Final    BLOOD LEFT ARM Performed at [facility], [address] [place], [place] [address]    Special Requests   Final    BOTTLES DRAWN AEROBIC AND ANAEROBIC Blood Culture adequate volume Performed at [facility], [address], [place], [place] [address]    Culture   Final    NO GROWTH 3 DAYS Performed at [facility], [place]  [address] [place], [place] [address]    Report Status PENDING  Incomplete  Culture, blood (Routine x 2)     Status: Abnormal (Preliminary result)   Collection Time: 10/16/21  8:13 AM   Specimen: BLOOD RIGHT ARM  Result Value Ref Range Status   Specimen Description   Final    BLOOD RIGHT ARM Performed at [facility], [address], [place], [place] [address]    Special Requests   Final    BOTTLES DRAWN AEROBIC AND ANAEROBIC Blood Culture adequate volume Performed at [facility], [address], [place], [place] [address]    Culture  Setup Time   Final    GRAM NEGATIVE RODS ANAEROBIC BOTTLE ONLY CRITICAL RESULT CALLED TO, READ BACK BY AND VERIFIED WITH: PHARMD ELLEN JACKSON 10/18/21'@2'$ :30 BY TW    Culture (A)  Final    HAEMOPHILUS INFLUENZAE BETA LACTAMASE NEGATIVE HEALTH DEPARTMENT NOTIFIED Performed at [facility], [place] [address] [place], [place] [address]    Report Status PENDING  Incomplete  Blood Culture ID Panel (Reflexed)     Status: Abnormal   Collection Time: 10/16/21  8:13 AM  Result Value Ref Range Status   Enterococcus faecalis NOT DETECTED NOT DETECTED Final   Enterococcus Faecium NOT DETECTED NOT DETECTED Final   Listeria monocytogenes NOT DETECTED NOT DETECTED Final   Staphylococcus species NOT DETECTED NOT DETECTED Final   Staphylococcus aureus (BCID) NOT DETECTED NOT DETECTED Final   Staphylococcus epidermidis NOT DETECTED NOT DETECTED Final   Staphylococcus lugdunensis NOT DETECTED NOT DETECTED Final   Streptococcus species NOT DETECTED NOT DETECTED Final   Streptococcus agalactiae NOT DETECTED NOT DETECTED Final   Streptococcus pneumoniae NOT DETECTED NOT DETECTED Final   Streptococcus pyogenes NOT DETECTED NOT DETECTED Final   A.calcoaceticus-baumannii NOT DETECTED NOT DETECTED Final   Bacteroides fragilis NOT DETECTED NOT DETECTED Final   Enterobacterales NOT DETECTED NOT DETECTED Final   Enterobacter cloacae  complex NOT DETECTED NOT DETECTED Final   Escherichia coli NOT DETECTED NOT DETECTED Final   Klebsiella aerogenes NOT DETECTED NOT DETECTED Final   Klebsiella oxytoca NOT DETECTED NOT DETECTED Final   Klebsiella pneumoniae NOT DETECTED NOT DETECTED Final   Proteus species NOT DETECTED NOT DETECTED Final   Salmonella species NOT DETECTED NOT DETECTED Final   Serratia marcescens NOT DETECTED NOT DETECTED Final   Haemophilus influenzae DETECTED (A) NOT DETECTED Final  Comment: CRITICAL RESULT CALLED TO, READ BACK BY AND VERIFIED WITH: PHARMD ELLEN JACKSON 10/18/21'@2'$ :30 BY TW    Neisseria meningitidis NOT DETECTED NOT DETECTED Final   Pseudomonas aeruginosa NOT DETECTED NOT DETECTED Final   Stenotrophomonas maltophilia NOT DETECTED NOT DETECTED Final   Candida albicans NOT DETECTED NOT DETECTED Final   Candida auris NOT DETECTED NOT DETECTED Final   Candida glabrata NOT DETECTED NOT DETECTED Final   Candida krusei NOT DETECTED NOT DETECTED Final   Candida parapsilosis NOT DETECTED NOT DETECTED Final   Candida tropicalis NOT DETECTED NOT DETECTED Final   Cryptococcus neoformans/gattii NOT DETECTED NOT DETECTED Final    Comment: Performed at [facility], [place] [address] [place], [place] [address]  Resp Panel by RT-PCR (Flu A&B, Covid) Anterior Nasal Swab     Status: None   Collection Time: 10/16/21  8:55 AM   Specimen: Anterior Nasal Swab  Result Value Ref Range Status   SARS Coronavirus 2 by RT PCR NEGATIVE NEGATIVE Final    Comment: (NOTE) SARS-CoV-2 target nucleic acids are NOT DETECTED.  The SARS-CoV-2 RNA is generally detectable in upper respiratory specimens during the acute phase of infection. The lowest concentration of SARS-CoV-2 viral copies this assay can detect is 138 copies/mL. A negative result does not preclude SARS-Cov-2 infection and should not be used as the sole basis for treatment or other patient management decisions. A negative result may occur with   improper specimen collection/handling, submission of specimen other than nasopharyngeal swab, presence of viral mutation(s) within the areas targeted by this assay, and inadequate number of viral copies(<138 copies/mL). A negative result must be combined with clinical observations, patient history, and epidemiological information. The expected result is Negative.  Fact Sheet for Patients:  EntrepreneurPulse.com.au  Fact Sheet for Healthcare Providers:  IncredibleEmployment.be  This test is no t yet approved or cleared by the [place] FDA and  has been authorized for detection and/or diagnosis of SARS-CoV-2 by FDA under an Emergency Use Authorization (EUA). This EUA will remain  in effect (meaning this test can be used) for the duration of the COVID-19 declaration under Section 564(b)(1) of the Act, 21 U.S.C.section 360bbb-3(b)(1), unless the authorization is terminated  or revoked sooner.       Influenza A by PCR NEGATIVE NEGATIVE Final   Influenza B by PCR NEGATIVE NEGATIVE Final    Comment: (NOTE) The Xpert Xpress SARS-CoV-2/FLU/RSV plus assay is intended as an aid in the diagnosis of influenza from Nasopharyngeal swab specimens and should not be used as a sole basis for treatment. Nasal washings and aspirates are unacceptable for Xpert Xpress SARS-CoV-2/FLU/RSV testing.  Fact Sheet for Patients: EntrepreneurPulse.com.au  Fact Sheet for Healthcare Providers: IncredibleEmployment.be  This test is not yet approved or cleared by the [place] FDA and has been authorized for detection and/or diagnosis of SARS-CoV-2 by FDA under an Emergency Use Authorization (EUA). This EUA will remain in effect (meaning this test can be used) for the duration of the COVID-19 declaration under Section 564(b)(1) of the Act, 21 U.S.C. section 360bbb-3(b)(1), unless the authorization is terminated  or revoked.  Performed at [facility], [address], [place], [place] [address]   Respiratory (~20 pathogens) panel by PCR     Status: Abnormal   Collection Time: 10/16/21 10:50 AM   Specimen: Nasopharyngeal Swab; Respiratory  Result Value Ref Range Status   Adenovirus NOT DETECTED NOT DETECTED Final   Coronavirus 229E NOT DETECTED NOT DETECTED Final    Comment: (  NOTE) The Coronavirus on the Respiratory Panel, DOES NOT test for the novel  Coronavirus (2019 nCoV)    Coronavirus HKU1 NOT DETECTED NOT DETECTED Final   Coronavirus NL63 NOT DETECTED NOT DETECTED Final   Coronavirus OC43 NOT DETECTED NOT DETECTED Final   Metapneumovirus NOT DETECTED NOT DETECTED Final   Rhinovirus / Enterovirus NOT DETECTED NOT DETECTED Final   Influenza A NOT DETECTED NOT DETECTED Final   Influenza B NOT DETECTED NOT DETECTED Final   Parainfluenza Virus 1 NOT DETECTED NOT DETECTED Final   Parainfluenza Virus 2 NOT DETECTED NOT DETECTED Final   Parainfluenza Virus 3 DETECTED (A) NOT DETECTED Final   Parainfluenza Virus 4 NOT DETECTED NOT DETECTED Final   Respiratory Syncytial Virus NOT DETECTED NOT DETECTED Final   Bordetella pertussis NOT DETECTED NOT DETECTED Final   Bordetella Parapertussis NOT DETECTED NOT DETECTED Final   Chlamydophila pneumoniae NOT DETECTED NOT DETECTED Final   Mycoplasma pneumoniae NOT DETECTED NOT DETECTED Final    Comment: Performed at [facility], [place] [address] [place], [place] [address]  Urine Culture     Status: None   Collection Time: 10/16/21 11:20 AM   Specimen: In/Out Cath Urine  Result Value Ref Range Status   Specimen Description   Final    IN/OUT CATH URINE Performed at [facility], [address] [place], [place] [address]    Special Requests   Final    NONE Performed at [facility], [address], [place], [place] [address]    Culture   Final    NO GROWTH Performed at [facility], [place] [address], [place], [place] [address]    Report Status 10/17/2021 FINAL  Final  Expectorated Sputum Assessment w Gram Stain, Rflx to Resp Cult     Status: None   Collection Time: 10/17/21  4:31 AM   Specimen: Sputum  Result Value Ref Range Status   Specimen Description SPUTUM  Final   Special Requests NONE  Final   Sputum evaluation   Final    THIS SPECIMEN IS ACCEPTABLE FOR SPUTUM CULTURE Performed at [facility], [place] [address], [place], [place] [address]    Report Status 10/17/2021 FINAL  Final  Culture, Respiratory w Gram Stain     Status: None   Collection Time: 10/17/21  4:31 AM   Specimen: SPU  Result Value Ref Range Status   Specimen Description   Final    SPUTUM Performed at [place] [address] [place], [place] [address]    Special Requests   Final    NONE Reflexed from [id number] Performed at [facility], [place] [address] [place], [place] [address]    Gram Stain   Final    ABUNDANT WBC PRESENT, PREDOMINANTLY PMN FEW GRAM POSITIVE COCCI FEW GRAM NEGATIVE RODS FEW GRAM POSITIVE RODS    Culture   Final    RARE Normal respiratory flora-no Staph aureus or Pseudomonas seen Performed at [facility], [address] [place], [place] [address]    Report Status 10/19/2021 FINAL  Final  MRSA Next Gen by PCR, Nasal     Status: None   Collection Time: 10/17/21  5:35 PM   Specimen: Nasal Mucosa; Nasal Swab  Result Value Ref Range Status   MRSA by PCR Next Gen NOT DETECTED NOT DETECTED Final    Comment: (NOTE) The GeneXpert MRSA Assay (FDA approved for NASAL specimens only), is one component of  a comprehensive MRSA colonization surveillance program. It is not intended to diagnose MRSA infection nor to guide or monitor treatment for MRSA infections. Test performance is not FDA approved in patients less than 62 years old. Performed at [facility], [place] [address] [place], [place] [address]          Radiology Studies: VAS [place] LOWER EXTREMITY VENOUS (DVT)  Result Date: 10/18/2021  Lower Venous DVT Study Patient Name:  Joseph Powell  Date of Exam:   10/18/2021 Medical Rec #: [id number]          Accession #:    [id number] Date of Birth: 1955/12/03          Patient Gender: M Patient Age:   37 years Exam Location:  [facility] Procedure:      VAS [place] LOWER EXTREMITY VENOUS (DVT) Referring Phys: Jerald Kief Reality Dejonge --------------------------------------------------------------------------------  Indications: Edema.  Risk Factors: None identified. Comparison Study: No prior studies. Performing Technologist: Oliver Hum RVT  Examination Guidelines: A complete evaluation includes B-mode imaging, spectral Doppler, color Doppler, and power Doppler as needed of all accessible portions of each vessel. Bilateral testing is considered an integral part of a complete examination. Limited examinations for reoccurring indications may be performed as noted. The reflux portion of the exam is performed with the patient in reverse Trendelenburg.  +---------+---------------+---------+-----------+----------+--------------+ RIGHT    CompressibilityPhasicitySpontaneityPropertiesThrombus Aging +---------+---------------+---------+-----------+----------+--------------+ CFV      Full           Yes      Yes                                 +---------+---------------+---------+-----------+----------+--------------+ SFJ      Full                                                        +---------+---------------+---------+-----------+----------+--------------+ FV Prox  Full                                                        +---------+---------------+---------+-----------+----------+--------------+ FV Mid   Full                                                        +---------+---------------+---------+-----------+----------+--------------+ FV  DistalFull                                                        +---------+---------------+---------+-----------+----------+--------------+ PFV      Full                                                        +---------+---------------+---------+-----------+----------+--------------+  POP      Full           Yes      Yes                                 +---------+---------------+---------+-----------+----------+--------------+ PTV      Full                                                        +---------+---------------+---------+-----------+----------+--------------+ PERO     Full                                                        +---------+---------------+---------+-----------+----------+--------------+   +---------+---------------+---------+-----------+----------+--------------+ LEFT     CompressibilityPhasicitySpontaneityPropertiesThrombus Aging +---------+---------------+---------+-----------+----------+--------------+ CFV      Full           Yes      Yes                                 +---------+---------------+---------+-----------+----------+--------------+ SFJ      Full                                                        +---------+---------------+---------+-----------+----------+--------------+ FV Prox  Full                                                        +---------+---------------+---------+-----------+----------+--------------+ FV Mid   Full                                                        +---------+---------------+---------+-----------+----------+--------------+ FV DistalFull                                                        +---------+---------------+---------+-----------+----------+--------------+ PFV      Full                                                        +---------+---------------+---------+-----------+----------+--------------+ POP      Full           Yes      Yes                                  +---------+---------------+---------+-----------+----------+--------------+  PTV      Full                                                        +---------+---------------+---------+-----------+----------+--------------+ PERO     Full                                                        +---------+---------------+---------+-----------+----------+--------------+    Summary: RIGHT: - There is no evidence of deep vein thrombosis in the lower extremity.  - No cystic structure found in the popliteal fossa.  LEFT: - There is no evidence of deep vein thrombosis in the lower extremity.  - No cystic structure found in the popliteal fossa.  *See table(s) above for measurements and observations.    Preliminary    ECHOCARDIOGRAM COMPLETE  Result Date: 10/17/2021    ECHOCARDIOGRAM REPORT   Patient Name:   Joseph Powell Date of Exam: 10/17/2021 Medical Rec #:  [id number]         Height:       71.0 in Accession #:    [id number]        Weight:       187.0 lb Date of Birth:  1955-10-22         BSA:          2.049 m Patient Age:    82 years          BP:           160/87 mmHg Patient Gender: M                 HR:           87 bpm. Exam Location:  Inpatient Procedure: 2D Echo, Cardiac Doppler and Color Doppler Indications:    Acute respiratory distress  History:        Patient has prior history of Echocardiogram examinations, most                 recent 11/29/2019. Risk Factors:Diabetes.  Sonographer:    Jefferey Pica Referring Phys: [phone] MURALI RAMASWAMY  Sonographer Comments: Image acquisition challenging due to respiratory motion. IMPRESSIONS  1. Left ventricular ejection fraction, by estimation, is 60 to 65%. The left ventricle has normal function. The left ventricle has no regional wall motion abnormalities. There is mild concentric left ventricular hypertrophy. Left ventricular diastolic parameters were normal.  2. Right ventricular systolic function is low normal. The right  ventricular size is mildly enlarged.  3. The mitral valve is normal in structure. No evidence of mitral valve regurgitation. No evidence of mitral stenosis.  4. The aortic valve is normal in structure. Aortic valve regurgitation is not visualized. No aortic stenosis is present.  5. The inferior vena cava is normal in size with greater than 50% respiratory variability, suggesting right atrial pressure of 3 mmHg. FINDINGS  Left Ventricle: Left ventricular ejection fraction, by estimation, is 60 to 65%. The left ventricle has normal function. The left ventricle has no regional wall motion abnormalities. The left ventricular internal cavity size was normal in size. There is  mild concentric left ventricular hypertrophy. Left ventricular diastolic parameters were normal. Right  Ventricle: The right ventricular size is mildly enlarged. No increase in right ventricular wall thickness. Right ventricular systolic function is low normal. Left Atrium: Left atrial size was normal in size. Right Atrium: Right atrial size was normal in size. Pericardium: There is no evidence of pericardial effusion. Presence of epicardial fat layer. Mitral Valve: The mitral valve is normal in structure. No evidence of mitral valve regurgitation. No evidence of mitral valve stenosis. Tricuspid Valve: The tricuspid valve is normal in structure. Tricuspid valve regurgitation is not demonstrated. No evidence of tricuspid stenosis. Aortic Valve: The aortic valve is normal in structure. Aortic valve regurgitation is not visualized. No aortic stenosis is present. Aortic valve peak gradient measures 8.5 mmHg. Pulmonic Valve: The pulmonic valve was normal in structure. Pulmonic valve regurgitation is not visualized. No evidence of pulmonic stenosis. Aorta: The aortic root is normal in size and structure. Venous: The inferior vena cava is normal in size with greater than 50% respiratory variability, suggesting right atrial pressure of 3 mmHg. IAS/Shunts: No  atrial level shunt detected by color flow Doppler.  LEFT VENTRICLE PLAX 2D LVIDd:         4.20 cm   Diastology LVIDs:         2.30 cm   LV e' medial:    10.10 cm/s LV PW:         1.00 cm   LV E/e' medial:  5.8 LV IVS:        1.10 cm   LV e' lateral:   12.40 cm/s LVOT diam:     2.00 cm   LV E/e' lateral: 4.7 LV SV:         79 LV SV Index:   39 LVOT Area:     3.14 cm  RIGHT VENTRICLE RV Basal diam:  3.90 cm RV Mid diam:    4.30 cm LEFT ATRIUM             Index        RIGHT ATRIUM           Index LA diam:        4.90 cm 2.39 cm/m   RA Area:     16.50 cm LA Vol (A2C):   39.4 ml 19.23 ml/m  RA Volume:   42.30 ml  20.64 ml/m LA Vol (A4C):   50.3 ml 24.55 ml/m LA Biplane Vol: 44.8 ml 21.86 ml/m  AORTIC VALVE                 PULMONIC VALVE AV Area (Vmax): 2.71 cm     PV Vmax:       1.13 m/s AV Vmax:        146.00 cm/s  PV Peak grad:  5.1 mmHg AV Peak Grad:   8.5 mmHg LVOT Vmax:      126.00 cm/s LVOT Vmean:     77.400 cm/s LVOT VTI:       0.253 m  AORTA Ao Root diam: 3.60 cm Ao Asc diam:  3.50 cm MITRAL VALVE MV Area (PHT): 3.91 cm    SHUNTS MV Decel Time: 194 msec    Systemic VTI:  0.25 m MV E velocity: 58.20 cm/s  Systemic Diam: 2.00 cm MV A velocity: 78.10 cm/s MV E/A ratio:  0.75 Kardie Tobb DO Electronically signed by Berniece Salines DO Signature Date/Time: 10/17/2021/3:21:08 PM    Final         Scheduled Meds:  allopurinol  300 mg Oral Daily   amLODipine  5  mg Oral Daily   carvedilol  3.125 mg Oral BID WC   enoxaparin (LOVENOX) injection  40 mg Subcutaneous Q24H   feeding supplement  237 mL Oral TID BM   fluticasone  1 spray Each Nare Daily   HYDROcodone bit-homatropine  5 mL Oral BID   hydrocortisone sod succinate (SOLU-CORTEF) inj  100 mg Intravenous Q12H   insulin aspart  0-15 Units Subcutaneous Q4H   insulin glargine-yfgn  15 Units Subcutaneous Daily   ipratropium-albuterol  3 mL Nebulization BID   loratadine  10 mg Oral Daily   pantoprazole  40 mg Oral Daily   sodium chloride flush  3 mL  Intravenous Q12H   Continuous Infusions:  cefTRIAXone (ROCEPHIN)  IV 2 g (10/19/21 0441)     LOS: 3 days    Time spent: 35 minutes.     Elmarie Shiley, MD [facility]   If 7PM-7AM, please contact night-coverage [email or web address]  10/19/2021, 10:49 AM

## 2021-10-19 NOTE — Plan of Care (Signed)
  Problem: Clinical Measurements: Goal: Will remain free from infection Outcome: Progressing Goal: Diagnostic test results will improve Outcome: Progressing Goal: Respiratory complications will improve Outcome: Progressing   Problem: Coping: Goal: Level of anxiety will decrease Outcome: Progressing   Problem: Safety: Goal: Ability to remain free from injury will improve Outcome: Progressing   Problem: Activity: Goal: Ability to tolerate increased activity will improve Outcome: Progressing

## 2021-10-20 ENCOUNTER — Encounter: Payer: Self-pay | Admitting: Hematology

## 2021-10-20 ENCOUNTER — Telehealth: Payer: Self-pay | Admitting: Internal Medicine

## 2021-10-20 DIAGNOSIS — A419 Sepsis, unspecified organism: Secondary | ICD-10-CM | POA: Diagnosis not present

## 2021-10-20 DIAGNOSIS — J9601 Acute respiratory failure with hypoxia: Secondary | ICD-10-CM | POA: Diagnosis not present

## 2021-10-20 DIAGNOSIS — J189 Pneumonia, unspecified organism: Secondary | ICD-10-CM | POA: Diagnosis not present

## 2021-10-20 LAB — CBC WITH DIFFERENTIAL/PLATELET
Abs Immature Granulocytes: 1.46 10*3/uL — ABNORMAL HIGH (ref 0.00–0.07)
Basophils Absolute: 0.1 10*3/uL (ref 0.0–0.1)
Basophils Relative: 1 %
Eosinophils Absolute: 0 10*3/uL (ref 0.0–0.5)
Eosinophils Relative: 0 %
HCT: 40.4 % (ref 39.0–52.0)
Hemoglobin: 13.3 g/dL (ref 13.0–17.0)
Immature Granulocytes: 8 %
Lymphocytes Relative: 4 %
Lymphs Abs: 0.7 10*3/uL (ref 0.7–4.0)
MCH: 31.7 pg (ref 26.0–34.0)
MCHC: 32.9 g/dL (ref 30.0–36.0)
MCV: 96.2 fL (ref 80.0–100.0)
Monocytes Absolute: 2.3 10*3/uL — ABNORMAL HIGH (ref 0.1–1.0)
Monocytes Relative: 13 %
Neutro Abs: 13.7 10*3/uL — ABNORMAL HIGH (ref 1.7–7.7)
Neutrophils Relative %: 74 %
Platelets: 150 10*3/uL (ref 150–400)
RBC: 4.2 MIL/uL — ABNORMAL LOW (ref 4.22–5.81)
RDW: 13.5 % (ref 11.5–15.5)
WBC: 18.3 10*3/uL — ABNORMAL HIGH (ref 4.0–10.5)
nRBC: 0 % (ref 0.0–0.2)

## 2021-10-20 LAB — BASIC METABOLIC PANEL
Anion gap: 9 (ref 5–15)
BUN: 15 mg/dL (ref 8–23)
CO2: 29 mmol/L (ref 22–32)
Calcium: 9 mg/dL (ref 8.9–10.3)
Chloride: 101 mmol/L (ref 98–111)
Creatinine, Ser: 0.66 mg/dL (ref 0.61–1.24)
GFR, Estimated: >60 mL/min (ref >60)
Glucose, Bld: 155 mg/dL — ABNORMAL HIGH (ref 70–99)
Potassium: 3.5 mmol/L (ref 3.5–5.1)
Sodium: 139 mmol/L (ref 135–145)

## 2021-10-20 LAB — GLUCOSE, CAPILLARY
Glucose-Capillary: 171 mg/dL — ABNORMAL HIGH (ref 70–99)
Glucose-Capillary: 138 mg/dL — ABNORMAL HIGH (ref 70–99)
Glucose-Capillary: 212 mg/dL — ABNORMAL HIGH (ref 70–99)
Glucose-Capillary: 160 mg/dL — ABNORMAL HIGH (ref 70–99)
Glucose-Capillary: 90 mg/dL (ref 70–99)
Glucose-Capillary: 103 mg/dL — ABNORMAL HIGH (ref 70–99)

## 2021-10-20 NOTE — Progress Notes (Signed)
SATURATION QUALIFICATIONS: (This note is used to comply with regulatory documentation for home oxygen)  Patient Saturations on Room Air at Rest = 95%  Patient Saturations on Room Air while Ambulating = 86%  Patient Saturations on 6 Liters of oxygen while Ambulating = > 95%  Please briefly explain why patient needs home oxygen: Pt meet the requirements for home oxygen. Requires 6L O2 to maintain saturation >95% while ambulating.

## 2021-10-20 NOTE — Telephone Encounter (Signed)
   Front desk - pls guive [facility] followup with Joseph Powell in 1 -2 weeks. Might have to call his wife to schedule. Afer that he wants to switch from Dr Melvyn Novas to Dr Chase Caller. Please get clearance. I am happy to take him

## 2021-10-20 NOTE — Evaluation (Signed)
Occupational Therapy Evaluation Patient Details Name: Joseph Powell MRN: [id number] DOB: [birth year] Today's Date: 10/20/2021   History of Present Illness 66 year old with past medical history significant for lymphoma diagnosed 2018, had stem cell transplant 2019, has done well since that time.  Pt admitted 10/16/21 with hypoxia and found to have Haemophilus Influenzae  Bacteremia, para-influenza 3. Bacterial PNA.  PMHx: Allergic rhinitis, Asthma, Bell's palsy, GERD (gastroesophageal reflux disease), Gout, Hypercholesteremia, Melanoma, Osteoarthritis, Sleep apnea, Thrombocytopenia, and Tubular adenoma. Required BIPAP and HHFNC to maintain oxygenation.   Clinical Impression   Joseph Powell is a 66 year old man who presents on 8 L HFNC. On evaluation he demonstrates functional strength and normal balance. He is able to perform pseudo tasks in the confines of the hospital environment - limited by oxygen line and monitoring equipment - but no physical assistance required. He was able to tolerate ambulate bout x 2 in room on 8 L HFNC without complaints of dyspnea and without a saturation drop. He was left in the recliner after educating on the need to mobilize and stay out of bed. Discussed home environment and patient has no DME needs. No further OT needs at this time.     Recommendations for follow up therapy are one component of a multi-disciplinary discharge planning process, led by the attending physician.  Recommendations may be updated based on patient status, additional functional criteria and insurance authorization.   Follow Up Recommendations  No OT follow up    Assistance Recommended at Discharge PRN  Patient can return home with the following Help with stairs or ramp for entrance    Functional Status Assessment  Patient has had a recent decline in their functional status and demonstrates the ability to make significant improvements in function in a reasonable and predictable  amount of time.  Equipment Recommendations  None recommended by OT    Recommendations for Other Services       Precautions / Restrictions Precautions Precautions: Other (comment) Precaution Comments: monitor sats Restrictions Weight Bearing Restrictions: No      Mobility Bed Mobility Overal bed mobility: Modified Independent                  Transfers                   General transfer comment: Supervision to stand and ambulate forward and back twice in room on 8 LHFNC. O2  sats maintained 93% and above.      Balance Overall balance assessment: No apparent balance deficits (not formally assessed)                                         ADL either performed or assessed with clinical judgement   ADL                                         General ADL Comments: No physical assistance needed to perform ADLs - just limited by oxygen needs and monitoring lines/leads.     Vision Patient Visual Report: No change from baseline       Perception     Praxis      Pertinent Vitals/Pain Pain Assessment Pain Assessment: No/denies pain     Hand Dominance     Extremity/Trunk Assessment Upper Extremity Assessment  Upper Extremity Assessment: Overall WFL for tasks assessed   Lower Extremity Assessment Lower Extremity Assessment: Overall WFL for tasks assessed   Cervical / Trunk Assessment Cervical / Trunk Assessment: Normal   Communication Communication Communication: No difficulties   Cognition Arousal/Alertness: Awake/alert Behavior During Therapy: WFL for tasks assessed/performed Overall Cognitive Status: Within Functional Limits for tasks assessed                                 General Comments: HOH - since illness.     General Comments       Exercises     Shoulder Instructions      Home Living Family/patient expects to be discharged to:: Private residence Living Arrangements:  Spouse/significant other   Type of Home: House Home Access: Stairs to enter [employer] of Steps: 6-9 Entrance Stairs-Rails: Right Home Layout: Two level;1/2 bath on main level (can sleep downstairs on couch) Alternate Level Stairs-Number of Steps: flight Alternate Level Stairs-Rails: Right Bathroom Shower/Tub: Tub/shower unit         Home Equipment: Cane - single point;Shower seat          Prior Functioning/Environment Prior Level of Function : Independent/Modified Independent                        OT Problem List: Cardiopulmonary status limiting activity;Decreased activity tolerance      OT Treatment/Interventions:      OT Goals(Current goals can be found in the care plan section) Acute Rehab OT Goals OT Goal Formulation: All assessment and education complete, DC therapy  OT Frequency:      Co-evaluation              AM-PAC OT "6 Clicks" Daily Activity     Outcome Measure Help from another person eating meals?: None Help from another person taking care of personal grooming?: None Help from another person toileting, which includes using toliet, bedpan, or urinal?: None Help from another person bathing (including washing, rinsing, drying)?: None Help from another person to put on and taking off regular upper body clothing?: None Help from another person to put on and taking off regular lower body clothing?: None 6 Click Score: 24   End of Session Equipment Utilized During Treatment: Oxygen Nurse Communication: Mobility status  Activity Tolerance: Patient tolerated treatment well Patient left: in chair;with call bell/phone within reach;with family/visitor present  OT Visit Diagnosis: Muscle weakness (generalized) (M62.81)                Time: 6384-6659 OT Time Calculation (min): 18 min Charges:  OT General Charges $OT Visit: 1 Visit  Derl Barrow, OTR/[facility]  Office [phone] Pager: [phone]   Lenward Chancellor 10/20/2021, 12:34 PM

## 2021-10-20 NOTE — Consult Note (Signed)
Reason for Consult: Hearing loss Referring Physician: Elmarie Shiley, MD  Joseph Powell is an 66 y.o. male.  HPI: Patient was admitted with pneumonia couple days ago.  He is improving with medical treatment.  Couple of weeks ago he started noticing some hearing loss on both sides.  He has never had problems with his ears before.  No known or diagnosed hearing loss in the past.  Past Medical History:  Diagnosis Date   Allergic rhinitis    Asthma    slight   Bell's palsy    GERD (gastroesophageal reflux disease)    Gout    Hypercholesteremia    Melanoma (HCC)    Osteoarthritis    Sleep apnea    Resolving   Thrombocytopenia (HCC)    Tubular adenoma     Past Surgical History:  Procedure Laterality Date   IR FLUORO GUIDE PORT INSERTION RIGHT  02/17/2017   IR REMOVAL TUN ACCESS W/ PORT W/O FL MOD SED  01/05/2018   IR US GUIDE VASC ACCESS RIGHT  02/17/2017   KNEE SURGERY  1993   MELANOMA EXCISION     NASAL RECONSTRUCTION     VASECTOMY  1999    Family History  Problem Relation Age of Onset   Diabetes Mother    Asthma Mother    Gout Brother    Colon cancer Paternal Uncle    Esophageal cancer Neg Hx    Stomach cancer Neg Hx     Social History:  reports that he has never smoked. He has been exposed to tobacco smoke. He has never used smokeless tobacco. He reports current alcohol use of about 5.0 standard drinks of alcohol per week. He reports that he does not use drugs.  Allergies:  Allergies  Allergen Reactions   Penicillamine    Penicillins Rash    Medications: Reviewed  Results for orders placed or performed during the hospital encounter of 10/16/21 (from the past 48 hour(s))  Glucose, capillary     Status: Abnormal   Collection Time: 10/18/21 11:35 AM  Result Value Ref Range   Glucose-Capillary 219 (H) 70 - 99 mg/dL    Comment: Glucose reference range applies only to samples taken after fasting for at least 8 hours.  Glucose, capillary     Status: Abnormal    Collection Time: 10/18/21  4:41 PM  Result Value Ref Range   Glucose-Capillary 208 (H) 70 - 99 mg/dL    Comment: Glucose reference range applies only to samples taken after fasting for at least 8 hours.  Glucose, capillary     Status: Abnormal   Collection Time: 10/18/21  7:49 PM  Result Value Ref Range   Glucose-Capillary 179 (H) 70 - 99 mg/dL    Comment: Glucose reference range applies only to samples taken after fasting for at least 8 hours.  Glucose, capillary     Status: Abnormal   Collection Time: 10/18/21 11:41 PM  Result Value Ref Range   Glucose-Capillary 190 (H) 70 - 99 mg/dL    Comment: Glucose reference range applies only to samples taken after fasting for at least 8 hours.  Glucose, capillary     Status: Abnormal   Collection Time: 10/19/21  4:37 AM  Result Value Ref Range   Glucose-Capillary 177 (H) 70 - 99 mg/dL    Comment: Glucose reference range applies only to samples taken after fasting for at least 8 hours.  Procalcitonin     Status: None   Collection Time: 10/19/21  5:37  AM  Result Value Ref Range   Procalcitonin <0.10 ng/mL    Comment:        Interpretation: PCT (Procalcitonin) <= 0.5 ng/mL: Systemic infection (sepsis) is not likely. Local bacterial infection is possible. (NOTE)       Sepsis PCT Algorithm           Lower Respiratory Tract                                      Infection PCT Algorithm    ----------------------------     ----------------------------         PCT < 0.25 ng/mL                PCT < 0.10 ng/mL          Strongly encourage             Strongly discourage   discontinuation of antibiotics    initiation of antibiotics    ----------------------------     -----------------------------       PCT 0.25 - 0.50 ng/mL            PCT 0.10 - 0.25 ng/mL               OR       >80% decrease in PCT            Discourage initiation of                                            antibiotics      Encourage discontinuation           of  antibiotics    ----------------------------     -----------------------------         PCT >= 0.50 ng/mL              PCT 0.26 - 0.50 ng/mL               AND        <80% decrease in PCT             Encourage initiation of                                             antibiotics       Encourage continuation           of antibiotics    ----------------------------     -----------------------------        PCT >= 0.50 ng/mL                  PCT > 0.50 ng/mL               AND         increase in PCT                  Strongly encourage                                      initiation of antibiotics    Strongly encourage escalation  of antibiotics                                     -----------------------------                                           PCT <= 0.25 ng/mL                                                 OR                                        > 80% decrease in PCT                                      Discontinue / Do not initiate                                             antibiotics  Performed at [facility] [address], [place], [place] [address]   CBC     Status: Abnormal   Collection Time: 10/19/21  7:18 AM  Result Value Ref Range   WBC 20.6 (H) 4.0 - 10.5 K/uL   RBC 4.25 4.22 - 5.81 MIL/uL   Hemoglobin 13.5 13.0 - 17.0 g/dL   HCT 41.5 39.0 - 52.0 %   MCV 97.6 80.0 - 100.0 fL   MCH 31.8 26.0 - 34.0 pg   MCHC 32.5 30.0 - 36.0 g/dL   RDW 13.8 11.5 - 15.5 %   Platelets 160 150 - 400 K/uL   nRBC 0.0 0.0 - 0.2 %    Comment: Performed at [facility], [place] [address], [place], [place] [address]  Basic metabolic panel     Status: Abnormal   Collection Time: 10/19/21  7:18 AM  Result Value Ref Range   Sodium 141 135 - 145 mmol/L   Potassium 4.0 3.5 - 5.1 mmol/L   Chloride 103 98 - 111 mmol/L   CO2 29 22 - 32 mmol/L   Glucose, Bld 138 (H) 70 - 99 mg/dL    Comment: Glucose reference range applies only to samples  taken after fasting for at least 8 hours.   BUN 12 8 - 23 mg/dL   Creatinine, Ser 0.70 0.61 - 1.24 mg/dL   Calcium 9.2 8.9 - 10.3 mg/dL   GFR, Estimated >60 >60 mL/min    Comment: (NOTE) Calculated using the CKD-EPI Creatinine Equation (2021)    Anion gap 9 5 - 15    Comment: Performed at [facility], [place] [address] [place], [place] [address]  Magnesium     Status: None   Collection Time: 10/19/21  7:18 AM  Result Value Ref Range   Magnesium 2.2 1.7 - 2.4 mg/dL    Comment: Performed at [facility], [place] [address], [place], [place] [address]  Glucose, capillary     Status: Abnormal   Collection Time: 10/19/21  7:39 AM  Result Value Ref Range   Glucose-Capillary 143 (H) 70 - 99 mg/dL    Comment: Glucose reference range applies only to samples taken after fasting for at least 8 hours.  Glucose, capillary     Status: Abnormal   Collection Time: 10/19/21 11:44 AM  Result Value Ref Range   Glucose-Capillary 113 (H) 70 - 99 mg/dL    Comment: Glucose reference range applies only to samples taken after fasting for at least 8 hours.  Glucose, capillary     Status: Abnormal   Collection Time: 10/19/21  4:24 PM  Result Value Ref Range   Glucose-Capillary 185 (H) 70 - 99 mg/dL    Comment: Glucose reference range applies only to samples taken after fasting for at least 8 hours.  Glucose, capillary     Status: Abnormal   Collection Time: 10/19/21 11:02 PM  Result Value Ref Range   Glucose-Capillary 156 (H) 70 - 99 mg/dL    Comment: Glucose reference range applies only to samples taken after fasting for at least 8 hours.  Glucose, capillary     Status: Abnormal   Collection Time: 10/20/21  3:45 AM  Result Value Ref Range   Glucose-Capillary 171 (H) 70 - 99 mg/dL    Comment: Glucose reference range applies only to samples taken after fasting for at least 8 hours.  CBC with Differential/Platelet     Status: Abnormal   Collection Time: 10/20/21  5:27 AM   Result Value Ref Range   WBC 18.3 (H) 4.0 - 10.5 K/uL   RBC 4.20 (L) 4.22 - 5.81 MIL/uL   Hemoglobin 13.3 13.0 - 17.0 g/dL   HCT 40.4 39.0 - 52.0 %   MCV 96.2 80.0 - 100.0 fL   MCH 31.7 26.0 - 34.0 pg   MCHC 32.9 30.0 - 36.0 g/dL   RDW 13.5 11.5 - 15.5 %   Platelets 150 150 - 400 K/uL   nRBC 0.0 0.0 - 0.2 %   Neutrophils Relative % 74 %   Neutro Abs 13.7 (H) 1.7 - 7.7 K/uL   Lymphocytes Relative 4 %   Lymphs Abs 0.7 0.7 - 4.0 K/uL   Monocytes Relative 13 %   Monocytes Absolute 2.3 (H) 0.1 - 1.0 K/uL   Eosinophils Relative 0 %   Eosinophils Absolute 0.0 0.0 - 0.5 K/uL   Basophils Relative 1 %   Basophils Absolute 0.1 0.0 - 0.1 K/uL   WBC Morphology MILD LEFT SHIFT (1-5% METAS, OCC MYELO, OCC BANDS)    RBC Morphology MORPHOLOGY UNREMARKABLE    Immature Granulocytes 8 %   Abs Immature Granulocytes 1.46 (H) 0.00 - 0.07 K/uL    Comment: Performed at [facility], [place] [address] [place], [place] [address]  Basic metabolic panel     Status: Abnormal   Collection Time: 10/20/21  5:27 AM  Result Value Ref Range   Sodium 139 135 - 145 mmol/L   Potassium 3.5 3.5 - 5.1 mmol/L   Chloride 101 98 - 111 mmol/L   CO2 29 22 - 32 mmol/L   Glucose, Bld 155 (H) 70 - 99 mg/dL    Comment: Glucose reference range applies only to samples taken after fasting for at least 8 hours.   BUN 15 8 - 23 mg/dL   Creatinine, Ser 0.66 0.61 - 1.24 mg/dL   Calcium 9.0 8.9 - 10.3 mg/dL   GFR, Estimated >60 >  60 mL/min    Comment: (NOTE) Calculated using the CKD-EPI Creatinine Equation (2021)    Anion gap 9 5 - 15    Comment: Performed at [facility], [place] [address], [place], [place] [address]  Glucose, capillary     Status: Abnormal   Collection Time: 10/20/21  7:52 AM  Result Value Ref Range   Glucose-Capillary 138 (H) 70 - 99 mg/dL    Comment: Glucose reference range applies only to samples taken after fasting for at least 8 hours.    VAS [place] LOWER EXTREMITY  VENOUS (DVT)  Result Date: 10/19/2021  Lower Venous DVT Study Patient Name:  CORBET HANLEY  Date of Exam:   10/18/2021 Medical Rec #: [id number]          Accession #:    [id number] Date of Birth: 05/23/1955          Patient Gender: M Patient Age:   56 years Exam Location:  [facility] Procedure:      VAS [place] LOWER EXTREMITY VENOUS (DVT) Referring Phys: Jerald Kief REGALADO --------------------------------------------------------------------------------  Indications: Edema.  Risk Factors: None identified. Comparison Study: No prior studies. Performing Technologist: Oliver Hum RVT  Examination Guidelines: A complete evaluation includes B-mode imaging, spectral Doppler, color Doppler, and power Doppler as needed of all accessible portions of each vessel. Bilateral testing is considered an integral part of a complete examination. Limited examinations for reoccurring indications may be performed as noted. The reflux portion of the exam is performed with the patient in reverse Trendelenburg.  +---------+---------------+---------+-----------+----------+--------------+ RIGHT    CompressibilityPhasicitySpontaneityPropertiesThrombus Aging +---------+---------------+---------+-----------+----------+--------------+ CFV      Full           Yes      Yes                                 +---------+---------------+---------+-----------+----------+--------------+ SFJ      Full                                                        +---------+---------------+---------+-----------+----------+--------------+ FV Prox  Full                                                        +---------+---------------+---------+-----------+----------+--------------+ FV Mid   Full                                                        +---------+---------------+---------+-----------+----------+--------------+ FV DistalFull                                                         +---------+---------------+---------+-----------+----------+--------------+ PFV      Full                                                        +---------+---------------+---------+-----------+----------+--------------+  POP      Full           Yes      Yes                                 +---------+---------------+---------+-----------+----------+--------------+ PTV      Full                                                        +---------+---------------+---------+-----------+----------+--------------+ PERO     Full                                                        +---------+---------------+---------+-----------+----------+--------------+   +---------+---------------+---------+-----------+----------+--------------+ LEFT     CompressibilityPhasicitySpontaneityPropertiesThrombus Aging +---------+---------------+---------+-----------+----------+--------------+ CFV      Full           Yes      Yes                                 +---------+---------------+---------+-----------+----------+--------------+ SFJ      Full                                                        +---------+---------------+---------+-----------+----------+--------------+ FV Prox  Full                                                        +---------+---------------+---------+-----------+----------+--------------+ FV Mid   Full                                                        +---------+---------------+---------+-----------+----------+--------------+ FV DistalFull                                                        +---------+---------------+---------+-----------+----------+--------------+ PFV      Full                                                        +---------+---------------+---------+-----------+----------+--------------+ POP      Full           Yes      Yes                                  +---------+---------------+---------+-----------+----------+--------------+  PTV      Full                                                        +---------+---------------+---------+-----------+----------+--------------+ PERO     Full                                                        +---------+---------------+---------+-----------+----------+--------------+     Summary: RIGHT: - There is no evidence of deep vein thrombosis in the lower extremity.  - No cystic structure found in the popliteal fossa.  LEFT: - There is no evidence of deep vein thrombosis in the lower extremity.  - No cystic structure found in the popliteal fossa.  *See table(s) above for measurements and observations. Electronically signed by Jamelle Haring on 10/19/2021 at 12:09:04 PM.    Final     JQB:HALPFXTK except as listed in admit H&P  Blood pressure 136/90, pulse 70, temperature 97.9 F (36.6 C), temperature source Oral, resp. rate (!) 21, height '5\' 11"'$  (1.803 m), weight 84.8 kg, SpO2 100 %.  PHYSICAL EXAM: Overall appearance:  Healthy appearing, in no distress, initially asleep with fullface CPAP in place, easily arousable and breathing well. Head:  Normocephalic, atraumatic. Ears: External auditory canals are clear; tympanic membranes are intact.  Exam is limited due to handheld otoscope but there may be middle ear effusion.  There is no obvious infection. Nose: External nose is healthy in appearance. Internal nasal exam free of any lesions or obstruction. Oral Cavity/Pharynx:  There are no mucosal lesions or masses identified. Larynx/Hypopharynx: Deferred Neuro:  No identifiable neurologic deficits. Neck: No palpable neck masses.  Studies Reviewed: none  Procedures: none   Assessment/Plan: Acute bilateral hearing loss, possible middle ear effusion on examination.  When he is able to he may start doing auto inflation exercises (pinching the nose and blowing hard to pop the ears).  That may be difficult  for him to do while he has pneumonia.  There is no other medical treatment for this.  We can finish the evaluation as an outpatient in the office when he is discharged.  We can do a full hearing test and a more thorough examination.  H91.90  Medical Decision Making: #/Complex Problems: 2  Data Reviewed:1  Management:2 (1-Straightforward, 2-Low, 3-Moderate, 4-High)   Izora Gala 10/20/2021, 8:07 AM

## 2021-10-20 NOTE — Progress Notes (Signed)
PROGRESS NOTE    Joseph Powell  TIR:[id number] DOB: [birth year] DOA: 10/16/2021 PCP: Faustino Congress, NP   Brief Narrative: 66 year old with past medical history significant for lymphoma diagnosed 2018, had stem cell transplant 2019, has done well since that time.  Followed by oncologist at [facility], last evaluation August 16, 2021, patient is in remission.  Patient present with worsening shortness of breath, and fevers.  He just returned from a trip from [place], 6/13. He developed shortness of breath and congestion, June first.  During these illness he received azithromycin initially for 3 days then subsequently extended to 7 days.  He presents with worsening shortness of breath, cough.    He was found to be hypoxic oxygen saturation 86 on room air, he required up to 9 L high flow oxygen. Chest x ray: Background pattern of scattered pulmonary scaring.  Superimposed acute bronchial thickening and patchy pulmonary infiltrates in the mid and lower lungs consistent with bronchopneumonia.  Patient found to have Haemophilus Influenzae  Bacteremia, para-influenza 3. Bacterial PNA. Acute hypoxic respiratory failure.    Assessment & Plan:   Principal Problem:   Sepsis due to pneumonia [facility]) Active Problems:   Pneumonia of both lungs due to infectious organism   Acute respiratory failure with hypoxia ([facility])   Type 2 diabetes mellitus without complication, without long-term current use of insulin (HCC)   Cough variant asthma vs UACS   Hypokalemia   GERD without esophagitis   Gout   Malnutrition of moderate degree  1-Sepsis secondary to pneumonia: Haemophilus Influenzae Bacteremia  Patient presents with acute hypoxic respiratory failure, leukocytosis, tachypnea,tachycardia/ in setting of PNA, parainfluenza, concern for superimpose bacterial infection.  -Parainfluenza 3 Positive.  -Blood culture. Haemophilus Influenzae -Continue with IV ceftriaxone.  -Follow sputum culture: rare respiratory  flora, legionella antigen pending, strep pneumonia negative. MRSA PCR> negative -CTA negative for PE>  -ID consulted. Recommended continue with IV ceftriaxone.  -WBC down 18. IV steroid contributing to leukocytosis.    Acute hypoxic respiratory failure secondary to pneumonia, : Bilateral Pneumonia; Parainfluenza and Haemophilus Influenzae PNA Patent requiring HFNC Oxygen  25 L 50 % on admission.  CCM consulted. ANA negative, RF: 13. Cyclic Citruli pending.  Continue with nebulizer.  He was on [facility] at [facility], and during day during hospitalization.  .  Continue with IV Ceftriaxone.  Elevation BNP-- ECHO normal EF, Low normal RV function in setting of PNA>  Continue with IV Hydrocortisone for 5 days, then will need taper.  Oxygen requirement down to 8 L HFNC. Significant improvement.  Plan for BIPAP at HS time one then DC.  200 mg IV hydrocortisone for 5 days /day 3. Then he will need another taper over 5 days.  Per CCM discharge in 1-2 days.   Diabetes type 2: SSI.  Started  Long acting insulin while on steroids.  A1c 6.6. Suspect elevated CBG in setting of steroids.   Cough variant asthma: Continue with nebulizer.   Hypokalemia: Replaced.  Suspect Otitis/Hearing loss; suspect related to viral infection/Haemophilus influenzae. . Claritin/  Hearing loss persist. Appreciate Dr Constance Holster evaluation. Follow up Out patient.   Gout: Continue with Allopurinol.   Moderate circumferential thickening of the distal esophagus by CT; Started PPI. Needs follow up when he recovers from PNA with gastroenterologist.   HTN; continue with Carvedilol. Started Norvasc. [place] Hydralazine.  BP better controlled.   Elevated D dimer: CTA negative for PE. Doppler LE; Negative for DVT.  Nutrition; poor appetite.  Started ensure. Snacks.  Nutrition consultation.  Malar rash; for completeness will check Double strand DNA>   Estimated body mass index is 26.08 kg/m as calculated from the following:   Height as  of this encounter: '5\' 11"'$  (1.803 m).   Weight as of this encounter: 84.8 kg.   DVT prophylaxis: Lovenox Code Status: Full code Family Communication: Wife at bedside.  Disposition Plan:  Status is: Inpatient Remains inpatient appropriate because: remain inpatient for management of Resp failure.     Consultants:  CCM  Procedures:  ECHO pending  Antimicrobials:  Cefepime, Doxy   Subjective: He is feeling better. Breathing improved. Coughing spell less frequent.    Objective: Vitals:   10/20/21 0400 10/20/21 0746 10/20/21 0833 10/20/21 1400  BP: 136/90  (!) 134/94   Pulse: 70  66   Resp: (!) 21     Temp:    97.8 F (36.6 C)  TempSrc:    Oral  SpO2: 95% 100%    Weight:      Height:        Intake/Output Summary (Last 24 hours) at 10/20/2021 1544 Last data filed at 10/20/2021 1104 Gross per 24 hour  Intake 240 ml  Output 700 ml  Net -460 ml    Filed Weights   10/16/21 0800  Weight: 84.8 kg    Examination:  General exam: NAD Respiratory system: No wheezing Cardiovascular system: S 1, S 2 RRR Gastrointestinal system: BS present, soft, nt Central nervous system:alert, follow command Extremities: No edema    Data Reviewed: I have personally reviewed following labs and imaging studies  CBC: Recent Labs  Lab 10/16/21 0813 10/17/21 0736 10/18/21 0735 10/19/21 0718 10/20/21 0527  WBC 29.5* 17.7* 13.0* 20.6* 18.3*  NEUTROABS 21.1* 12.2* 10.1*  --  13.7*  HGB 13.5 12.1* 13.4 13.5 13.3  HCT 39.8 37.0* 40.4 41.5 40.4  MCV 92.1 97.1 96.4 97.6 96.2  PLT 150 125* 159 160 578    Basic Metabolic Panel: Recent Labs  Lab 10/16/21 0813 10/16/21 0855 10/17/21 0507 10/18/21 0735 10/19/21 0718 10/20/21 0527  NA 136  --  139 141 141 139  K 3.2*  --  3.8 3.4* 4.0 3.5  CL 95*  --  103 102 103 101  CO2 22  --  '26 29 29 29  '$ GLUCOSE 142*  --  152* 229* 138* 155*  BUN 9  --  6* '10 12 15  '$ CREATININE 0.63  --  0.55* 0.66 0.70 0.66  CALCIUM 9.5  --  8.1* 8.8*  9.2 9.0  MG  --  1.7 1.9 2.2 2.2  --   PHOS  --  3.1  --  3.0  --   --     GFR: Estimated Creatinine Clearance: 96.7 mL/min (by C-G formula based on SCr of 0.66 mg/dL). Liver Function Tests: Recent Labs  Lab 10/16/21 0813 10/17/21 0507  AST 50* 43*  ALT 36 33  ALKPHOS 65 55  BILITOT 1.0 0.7  PROT 6.1* 5.2*  ALBUMIN 3.6 2.6*    No results for input(s): "LIPASE", "AMYLASE" in the last 168 hours. No results for input(s): "AMMONIA" in the last 168 hours. Coagulation Profile: Recent Labs  Lab 10/16/21 0813  INR 1.3*    Cardiac Enzymes: No results for input(s): "CKTOTAL", "CKMB", "CKMBINDEX", "TROPONINI" in the last 168 hours. BNP (last 3 results) No results for input(s): "PROBNP" in the last 8760 hours. HbA1C: No results for input(s): "HGBA1C" in the last 72 hours.  CBG: Recent Labs  Lab 10/19/21 1624 10/19/21 2302 10/20/21  0345 10/20/21 0752 10/20/21 1159  GLUCAP 185* 156* 171* 138* 212*    Lipid Profile: No results for input(s): "CHOL", "HDL", "LDLCALC", "TRIG", "CHOLHDL", "LDLDIRECT" in the last 72 hours. Thyroid Function Tests: No results for input(s): "TSH", "T4TOTAL", "FREET4", "T3FREE", "THYROIDAB" in the last 72 hours. Anemia Panel: No results for input(s): "VITAMINB12", "FOLATE", "FERRITIN", "TIBC", "IRON", "RETICCTPCT" in the last 72 hours. Sepsis Labs: Recent Labs  Lab 10/16/21 0827 10/16/21 0958 10/17/21 0507 10/18/21 0735 10/19/21 0537  PROCALCITON  --   --  <0.10 <0.10 <0.10  LATICACIDVEN 1.5 1.5  --   --   --      Recent Results (from the past 240 hour(s))  Culture, blood (Routine x 2)     Status: None (Preliminary result)   Collection Time: 10/16/21  8:13 AM   Specimen: BLOOD LEFT ARM  Result Value Ref Range Status   Specimen Description   Final    BLOOD LEFT ARM Performed at [facility], [address], [place], [place] [address]    Special Requests   Final    BOTTLES DRAWN AEROBIC AND ANAEROBIC Blood Culture  adequate volume Performed at [facility], [address] [place], [place] [address]    Culture   Final    NO GROWTH 4 DAYS Performed at [facility], [place] [address] [place], [place] [address]    Report Status PENDING  Incomplete  Culture, blood (Routine x 2)     Status: Abnormal (Preliminary result)   Collection Time: 10/16/21  8:13 AM   Specimen: BLOOD RIGHT ARM  Result Value Ref Range Status   Specimen Description   Final    BLOOD RIGHT ARM Performed at [facility], [address], [place], [place] [address]    Special Requests   Final    BOTTLES DRAWN AEROBIC AND ANAEROBIC Blood Culture adequate volume Performed at [facility], [address] [place], [place] [address]    Culture  Setup Time   Final    GRAM NEGATIVE RODS ANAEROBIC BOTTLE ONLY CRITICAL RESULT CALLED TO, READ BACK BY AND VERIFIED WITH: PHARMD ELLEN JACKSON 10/18/21'@2'$ :30 BY TW    Culture (A)  Final    HAEMOPHILUS INFLUENZAE BETA LACTAMASE NEGATIVE HEALTH DEPARTMENT NOTIFIED Performed at [facility], [place] [address] [place], [place] [address]    Report Status PENDING  Incomplete  Blood Culture ID Panel (Reflexed)     Status: Abnormal   Collection Time: 10/16/21  8:13 AM  Result Value Ref Range Status   Enterococcus faecalis NOT DETECTED NOT DETECTED Final   Enterococcus Faecium NOT DETECTED NOT DETECTED Final   Listeria monocytogenes NOT DETECTED NOT DETECTED Final   Staphylococcus species NOT DETECTED NOT DETECTED Final   Staphylococcus aureus (BCID) NOT DETECTED NOT DETECTED Final   Staphylococcus epidermidis NOT DETECTED NOT DETECTED Final   Staphylococcus lugdunensis NOT DETECTED NOT DETECTED Final   Streptococcus species NOT DETECTED NOT DETECTED Final   Streptococcus agalactiae NOT DETECTED NOT DETECTED Final   Streptococcus pneumoniae NOT DETECTED NOT DETECTED Final   Streptococcus pyogenes NOT DETECTED NOT DETECTED Final    A.calcoaceticus-baumannii NOT DETECTED NOT DETECTED Final   Bacteroides fragilis NOT DETECTED NOT DETECTED Final   Enterobacterales NOT DETECTED NOT DETECTED Final   Enterobacter cloacae complex NOT DETECTED NOT DETECTED Final   Escherichia coli NOT DETECTED NOT DETECTED Final   Klebsiella aerogenes NOT DETECTED NOT DETECTED Final   Klebsiella oxytoca NOT DETECTED NOT DETECTED Final  Klebsiella pneumoniae NOT DETECTED NOT DETECTED Final   Proteus species NOT DETECTED NOT DETECTED Final   Salmonella species NOT DETECTED NOT DETECTED Final   Serratia marcescens NOT DETECTED NOT DETECTED Final   Haemophilus influenzae DETECTED (A) NOT DETECTED Final    Comment: CRITICAL RESULT CALLED TO, READ BACK BY AND VERIFIED WITH: PHARMD ELLEN JACKSON 10/18/21'@2'$ :30 BY TW    Neisseria meningitidis NOT DETECTED NOT DETECTED Final   Pseudomonas aeruginosa NOT DETECTED NOT DETECTED Final   Stenotrophomonas maltophilia NOT DETECTED NOT DETECTED Final   Candida albicans NOT DETECTED NOT DETECTED Final   Candida auris NOT DETECTED NOT DETECTED Final   Candida glabrata NOT DETECTED NOT DETECTED Final   Candida krusei NOT DETECTED NOT DETECTED Final   Candida parapsilosis NOT DETECTED NOT DETECTED Final   Candida tropicalis NOT DETECTED NOT DETECTED Final   Cryptococcus neoformans/gattii NOT DETECTED NOT DETECTED Final    Comment: Performed at [facility], [place] [address], [place], [place] [address]  Resp Panel by RT-PCR (Flu A&B, Covid) Anterior Nasal Swab     Status: None   Collection Time: 10/16/21  8:55 AM   Specimen: Anterior Nasal Swab  Result Value Ref Range Status   SARS Coronavirus 2 by RT PCR NEGATIVE NEGATIVE Final    Comment: (NOTE) SARS-CoV-2 target nucleic acids are NOT DETECTED.  The SARS-CoV-2 RNA is generally detectable in upper respiratory specimens during the acute phase of infection. The lowest concentration of SARS-CoV-2 viral copies this assay can detect is 138 copies/mL.  A negative result does not preclude SARS-Cov-2 infection and should not be used as the sole basis for treatment or other patient management decisions. A negative result may occur with  improper specimen collection/handling, submission of specimen other than nasopharyngeal swab, presence of viral mutation(s) within the areas targeted by this assay, and inadequate number of viral copies(<138 copies/mL). A negative result must be combined with clinical observations, patient history, and epidemiological information. The expected result is Negative.  Fact Sheet for Patients:  EntrepreneurPulse.com.au  Fact Sheet for Healthcare Providers:  IncredibleEmployment.be  This test is no t yet approved or cleared by the [place] FDA and  has been authorized for detection and/or diagnosis of SARS-CoV-2 by FDA under an Emergency Use Authorization (EUA). This EUA will remain  in effect (meaning this test can be used) for the duration of the COVID-19 declaration under Section 564(b)(1) of the Act, 21 U.S.C.section 360bbb-3(b)(1), unless the authorization is terminated  or revoked sooner.       Influenza A by PCR NEGATIVE NEGATIVE Final   Influenza B by PCR NEGATIVE NEGATIVE Final    Comment: (NOTE) The Xpert Xpress SARS-CoV-2/FLU/RSV plus assay is intended as an aid in the diagnosis of influenza from Nasopharyngeal swab specimens and should not be used as a sole basis for treatment. Nasal washings and aspirates are unacceptable for Xpert Xpress SARS-CoV-2/FLU/RSV testing.  Fact Sheet for Patients: EntrepreneurPulse.com.au  Fact Sheet for Healthcare Providers: IncredibleEmployment.be  This test is not yet approved or cleared by the [place] FDA and has been authorized for detection and/or diagnosis of SARS-CoV-2 by FDA under an Emergency Use Authorization (EUA). This EUA will remain in effect (meaning this test  can be used) for the duration of the COVID-19 declaration under Section 564(b)(1) of the Act, 21 U.S.C. section 360bbb-3(b)(1), unless the authorization is terminated or revoked.  Performed at [facility], [place], [place] [address]   Respiratory (~20 pathogens) panel by PCR  Status: Abnormal   Collection Time: 10/16/21 10:50 AM   Specimen: Nasopharyngeal Swab; Respiratory  Result Value Ref Range Status   Adenovirus NOT DETECTED NOT DETECTED Final   Coronavirus 229E NOT DETECTED NOT DETECTED Final    Comment: (NOTE) The Coronavirus on the Respiratory Panel, DOES NOT test for the novel  Coronavirus (2019 nCoV)    Coronavirus HKU1 NOT DETECTED NOT DETECTED Final   Coronavirus NL63 NOT DETECTED NOT DETECTED Final   Coronavirus OC43 NOT DETECTED NOT DETECTED Final   Metapneumovirus NOT DETECTED NOT DETECTED Final   Rhinovirus / Enterovirus NOT DETECTED NOT DETECTED Final   Influenza A NOT DETECTED NOT DETECTED Final   Influenza B NOT DETECTED NOT DETECTED Final   Parainfluenza Virus 1 NOT DETECTED NOT DETECTED Final   Parainfluenza Virus 2 NOT DETECTED NOT DETECTED Final   Parainfluenza Virus 3 DETECTED (A) NOT DETECTED Final   Parainfluenza Virus 4 NOT DETECTED NOT DETECTED Final   Respiratory Syncytial Virus NOT DETECTED NOT DETECTED Final   Bordetella pertussis NOT DETECTED NOT DETECTED Final   Bordetella Parapertussis NOT DETECTED NOT DETECTED Final   Chlamydophila pneumoniae NOT DETECTED NOT DETECTED Final   Mycoplasma pneumoniae NOT DETECTED NOT DETECTED Final    Comment: Performed at [facility], [place] [address], [place], [place] [address]  Urine Culture     Status: None   Collection Time: 10/16/21 11:20 AM   Specimen: In/Out Cath Urine  Result Value Ref Range Status   Specimen Description   Final    IN/OUT CATH URINE Performed at [facility], [address] [place], [place] [address]    Special Requests    Final    NONE Performed at [facility], [address] [place], [place] [address]    Culture   Final    NO GROWTH Performed at [facility], [place] [address] [place], [place] [address]    Report Status 10/17/2021 FINAL  Final  Expectorated Sputum Assessment w Gram Stain, Rflx to Resp Cult     Status: None   Collection Time: 10/17/21  4:31 AM   Specimen: Sputum  Result Value Ref Range Status   Specimen Description SPUTUM  Final   Special Requests NONE  Final   Sputum evaluation   Final    THIS SPECIMEN IS ACCEPTABLE FOR SPUTUM CULTURE Performed at [facility], [place] [address] [place], [place] [address]    Report Status 10/17/2021 FINAL  Final  Culture, Respiratory w Gram Stain     Status: None   Collection Time: 10/17/21  4:31 AM   Specimen: SPU  Result Value Ref Range Status   Specimen Description   Final    SPUTUM Performed at [place] [address], [place],  [address]    Special Requests   Final    NONE Reflexed from [id number] Performed at [facility], [place] [address], [place], [place] [address]    Gram Stain   Final    ABUNDANT WBC PRESENT, PREDOMINANTLY PMN FEW GRAM POSITIVE COCCI FEW GRAM NEGATIVE RODS FEW GRAM POSITIVE RODS    Culture   Final    RARE Normal respiratory flora-no Staph aureus or Pseudomonas seen Performed at [facility], [address], [place],  [address]    Report Status 10/19/2021 FINAL  Final  MRSA Next Gen by PCR, Nasal     Status: None   Collection Time: 10/17/21  5:35 PM  Specimen: Nasal Mucosa; Nasal Swab  Result Value Ref Range Status   MRSA by PCR Next Gen NOT DETECTED NOT DETECTED Final    Comment: (NOTE) The GeneXpert MRSA Assay (FDA approved for NASAL specimens only), is one component of a comprehensive MRSA colonization surveillance program. It is not intended to diagnose MRSA infection nor to guide or monitor treatment  for MRSA infections. Test performance is not FDA approved in patients less than 60 years old. Performed at [facility], [place] [address] [place], [place] [address]          Radiology Studies: No results found.      Scheduled Meds:  allopurinol  300 mg Oral Daily   amLODipine  5 mg Oral Daily   carvedilol  3.125 mg Oral BID WC   enoxaparin (LOVENOX) injection  40 mg Subcutaneous Q24H   feeding supplement  237 mL Oral TID BM   fluticasone  1 spray Each Nare Daily   HYDROcodone bit-homatropine  5 mL Oral BID   hydrocortisone sod succinate (SOLU-CORTEF) inj  100 mg Intravenous Q12H   insulin aspart  0-15 Units Subcutaneous Q4H   insulin glargine-yfgn  15 Units Subcutaneous Daily   ipratropium-albuterol  3 mL Nebulization BID   loratadine  10 mg Oral Daily   oxymetazoline  1 spray Each Nare BID   pantoprazole  40 mg Oral Daily   sodium chloride flush  3 mL Intravenous Q12H   sodium chloride HYPERTONIC  4 mL Nebulization BID   Continuous Infusions:  cefTRIAXone (ROCEPHIN)  IV 2 g (10/20/21 0344)     LOS: 4 days    Time spent: 35 minutes.     Elmarie Shiley, MD [facility]   If 7PM-7AM, please contact night-coverage [email or web address]  10/20/2021, 3:44 PM

## 2021-10-20 NOTE — Plan of Care (Signed)
  Problem: Clinical Measurements: Goal: Will remain free from infection Outcome: Progressing Goal: Diagnostic test results will improve Outcome: Progressing Goal: Respiratory complications will improve Outcome: Progressing   Problem: Activity: Goal: Risk for activity intolerance will decrease Outcome: Progressing   Problem: Nutrition: Goal: Adequate nutrition will be maintained Outcome: Progressing   Problem: Activity: Goal: Ability to tolerate increased activity will improve Outcome: Progressing   Problem: Respiratory: Goal: Ability to maintain adequate ventilation will improve Outcome: Progressing

## 2021-10-20 NOTE — Progress Notes (Signed)
Nutrition Follow-up RD working remotely.   DOCUMENTATION CODES:   Non-severe (moderate) malnutrition in context of chronic illness  INTERVENTION:  - will liberalize diet to Regular to allow for increased options and to allow family/visitors to bring in anything patient would like to eat or drink. - will order 10 AM and 2 PM daily snacks. - continue Ensure TID.   NUTRITION DIAGNOSIS:   Moderate Malnutrition related to chronic illness as evidenced by mild muscle depletion, percent weight loss. -ongoing  GOAL:   Patient will meet greater than or equal to 90% of their needs -unmet  MONITOR:   PO intake, Supplement acceptance, Labs, Weight trends  REASON FOR ASSESSMENT:   Consult Assessment of nutrition requirement/status  ASSESSMENT:   66 year old male who is s/p CHOP and stem cell transplant for lymphoma T-cell non-Hodgkin's believed to be in complete remission in 2019, hypercholesterolemia, GERD, sleep apnea, Bell's palsy, osteoarthritis, and asthma. He presented to the ED on 6/14 due to acute hypoxemic respiratory failure. He was found to have bilateral lower lobe pulmonary infiltrates. He was admitted for sepsis due to PNA.  Patient seen in person by this RD on 6/16. Patient on BiPAP at that time and his wife was at bedside.   Review of [facility] indicates that patient has skipped several meals over the past 2 days; could be in part d/t need for BiPAP. He is currently on HFNC.   No breakfast ordered yet this AM. Ensure ordered TID by this RD on 6/16 and patient noted to have accepted all but the first bottle of supplement (could have been d/t BiPAP need).  Will order daily snacks at 1000 (cottage cheese, fresh fruit, vanilla wafers) and 1400 ([place] and cheddar cheese sandwich with lettuce and tomato on white bread and a diet Coke).  He has not been weighed since 6/14. No information documented in the edema section of flow sheet this admission.  ENT has been consulted;  pending full assessment outpatient.   Labs reviewed; CBGs: 171 and 138 mg/dl. Medications reviewed; 100 mg solu-cortef BID, sliding scale novolog, 15 units semglee/day, 40 mg protonix/day.   Diet Order:   Diet Order             Diet heart healthy/carb modified Room service appropriate? Yes; Fluid consistency: Thin  Diet effective now                   EDUCATION NEEDS:   Not appropriate for education at this time  Skin:  Skin Assessment: Reviewed RN Assessment  Last BM:  6/17 (type 7 x2, one medium amount and one large amount)  Height:   Ht Readings from Last 1 Encounters:  10/16/21 '5\' 11"'$  (1.803 m)    Weight:   Wt Readings from Last 1 Encounters:  10/16/21 84.8 kg     BMI:  Body mass index is 26.08 kg/m.  Estimated Nutritional Needs:  Kcal:  2150-2400 kcal Protein:  110-125 grams Fluid:  >/= 2.3 L/day     Jarome Matin, MS, RD, LDN, CNSC Registered Dietitian II Inpatient Clinical Nutrition RD pager # and on-call/weekend pager # available in [facility]

## 2021-10-20 NOTE — Progress Notes (Signed)
Checked on pt to see if he Is ready to go on bipap. He is awake and alert no distress sitting in the chair at this time. Told him and his wife to have nurse call respiratory if he gets back in bed and wants to nap. I will check on him again at 4 pm rounds.

## 2021-10-20 NOTE — Progress Notes (Addendum)
NAME:  Joseph Powell, MRN:  [id number], DOB:  1956-01-08, LOS: 4 ADMISSION DATE:  10/16/2021, CONSULTATION DATE:  10/17/21 REFERRING MD:  Dr Tyrell Antonio, CHIEF COMPLAINT:  Acute hypoxemic resp virust - PArainfleunza 3    BRIEF  -66 year old male who is status post CHOP and stem cell transplant for history of lymphoma T-cell non-Hodgkin's believed to be in complete remission in 2019.  He follows with Dr. Christinia Gully for chronic cough asthma and allergies.  2018 had normal spirometry and allergy profile in September 2022 with normal IgE and no eosinophils in 2023 exhaled nitric oxide was normal.  Most recent chest x-ray was in April 2023 with peribronchial thickening but no infiltrates [in 2019 did have a CT scan of the chest that had a left lower lobe infiltrate] most recent pulmonary clinic follow-up April 2023 showed improvement in cough with short course prednisone.  Consider this cough variant asthma  Now in the middle of May 2023 he went on a remote close and also travel to [place] in [place] in [place].  1 week into this day because of lack of air conditioning a left is with the room open and had a fan blowing into his face.  Shortly after that he and his wife got sick.  This was around May 27/28 2023.  Then on October 03, 2021 he was evaluated by a physician and then again also on October 08, 2021.  At no point his pulse ox was checked.  He was given azithromycin.  His wife recovered with the same antibiotic but [place] starting her on October 08, 2021 was actually getting short of breath.  He arrived back in [place] on November 01, 2021.  By this time he felt like he was hypoxemic and also short of breath with minimal exertion and having significant cough.  Presented to [facility] emergency room 10/16/2021 found to be acute hypoxemic respiratory failure.  Had bilateral lower lobe pulmonary infiltrates.  White count was elevated.  Started on Antibiotics but procalcitonin normal.  Also given fluids.   If progressive worsening hypoxemia in the emergency department admitted to progressive unit at [facility].  Pulmonary critical care consulted morning of 10/17/2021 requiring 25 L high flow nasal cannula.  Desaturations with cough noted.  Some associated malaise and poor appetite.  No chest pains.  The cough is dry.  No edema no hemoptysis.  CT angiogram dated, troponin, D-dimer, echo data not available.  No urine strep or Legionella.  He is positive for parainfluenza virus 3.   Past Medical History:    has a past medical history of Allergic rhinitis, Asthma, Bell's palsy, GERD (gastroesophageal reflux disease), Gout, Hypercholesteremia, Melanoma ([place]), Osteoarthritis, Sleep apnea, Thrombocytopenia ([place]), and Tubular adenoma.   has a past surgical history that includes Knee surgery (1993); Vasectomy (1999); Nasal reconstruction; Melanoma excision; IR US Guide Vasc Access Right (02/17/2017); IR FLUORO GUIDE PORT INSERTION RIGHT (02/17/2017); and IR REMOVAL TUN ACCESS W/ PORT W/O FL MOD SED (01/05/2018).    Significant Hospital Events:  10/16/2021 - admit. PIV 3+ 6/15 - ccm consut. 25L . STart BiPAP x 2 in day with QHS and IV Steroid Solumedrol  - CTA - neg PE  - BCID /14 wirh H Flu  - QUant gold -    - Urine strep - neg  - coviud IgG - neg  = HIV neg  - PCT < 0.1  - ESR 32  - ECHO - LVEF 65%. Normal DDx. Mild reduced RV  Function  - ANA/RF - 6/16 - NEG  - BCID - H Flu  6/16- di dnot toelrated BiPAP last night.  He says nurses thought he was anxious. He denies it. Says the BiPAP produced no air. He is willing to try BiPAP this AM. REmains on 25L HFNC . BCID with H Flu. Abx being narrowed to ceftriaxone. Coughs and when sat our of bed on 25L [place] - desat to 85%-88%.  Movements produce cough.  START $Remo'200mg'GKiUb$  per day IV hydrocort  6/17 - 6/17 - Used BiPAP last night. BP high. On hydrocort $RemoveBefo'200mg'FdUcXargxUZ$  per day for CAP (D3 of steroids, D2 hydrocor), Now on 20L HFNC with 40% fio2. AFebrile.  And by  Afetrnoon down to 15L [place] -> in room turned down to 30% fio2/10L an dstill pulse ox 99%. Coughed and did not desat . Wife reports  - HOH since illness started. On flonase. ENT consult pending per her  - Low appetite  SUBJECTIVE/OVERNIGHT/INTERVAL HX    6/18 - down to 8L [place] -> after CCM MD arrival: down to [facility] - 98%. USed bipap last night. Seen by ENT Dr Constance Holster - suspect middle ear effusion. Valsalva recommended.  Affebrile. Remains on IV ceftriaxone   Objective   Blood pressure (!) 134/94, pulse 66, temperature 97.9 F (36.6 C), temperature source Oral, resp. rate (!) 21, height $RemoveBe'5\' 11"'wCLGxlkTU$  (1.803 m), weight 84.8 kg, SpO2 100 %.    FiO2 (%):  [40 %] 40 %   Intake/Output Summary (Last 24 hours) at 10/20/2021 1453 Last data filed at 10/20/2021 1104 Gross per 24 hour  Intake 240 ml  Output 700 ml  Net -460 ml   Filed Weights   10/16/21 0800  Weight: 84.8 kg   General Appearance:  Looks better Head:  Normocephalic, without obvious abnormality, atraumatic Eyes:  PERRL - ues, conjunctiva/corneas - muddy     Ears:  Normal external ear canals, both ears Nose:  G tube - NO bu has [employer] Throat:  ETT TUBE - no , OG tube - no Neck:  Supple,  No enlargement/tenderness/nodules Lungs: Clear to auscultation bilaterally anteriorly. Has LL crackles Heart:  S1 and S2 normal, no murmur, CVP - no.  Pressors - no Abdomen:  Soft, no masses, no organomegaly Genitalia / Rectal:  Not done Extremities:  Extremities- intact Skin:  ntact in exposed areas . Sacral area - not examined Neurologic:  Sedation - none -> RASS - +1 . Moves all 4s - yes. CAM-ICU - neg . Orientation - x# HOH ++         X  Assessment & Plan:  ASSESSMENT / PLAN:  PULMONARY  A:  Has history of chronic cough secondary to upper airway cough syndrome [.  Chest x-ray in April 2023 but no prior CT scan chest]  -No known ILD   Acute hypoxic respiratory failure with associated pulmonary infiltrates/ALOI - Present on Admit -  due  to parainfluenza virus 3 and secondary CAP H Flu in BCID -requiring 25 L nasal cannula high flow - Present on Admit - Severe cough and desaturation secondary to the above  PPhysical deconditioning - onset in hospital   10/20/2021 -> rapidly improved -  Tolerating BiPAP. Down to 4-5L [place] 98%  P:   [place] o2 for pusle ox > 90%   DC day time BiPAP  Can do BiPAP QHS x 1 and then  DC  Continue  HYDROCORT (NEJM 2023 data for CAP)  - On 10/20/2021  D3/5 days at $Remov'200mg'LfHPUV$   daily and then taper over another 5 days (total 10 days)     Cough suppression with hycodan to continue  Continue 3% saline neb for cough and sputum clearnce  - can do this at home too  CAP Rx with Ceftriaxone -> change to oral Doxy/other agen 10/21/21 by [facility]/ID  - suspect needs 7 -14 day Rx - ID to decide  NAsal afrin to open up nasal passages and ENT followup  OPD Transitions asesmsent  - O2 need  - Home PT needed  Followup  - APP Tammy followup (she knows him) -> then Dr Chase Caller (switching from Dr Melvyn Novas) ; office sent a message - Needs OPD CT Scan in 3 months  - Family want CCM to round 10/21/21  Best practice (daily eval):  According to the hospitalist  Goals of Care:      Family Updates:  6/15 and 6/16  Full code.  He and his wife updated at the bedside.  Also spoke to the hospitalist.  Anticipated recovery can be days to weeks.  6/17 - wife updated  6/18 - wife and patient       SIGNATURE    Dr. Brand Males, M.D., F.C.C.P,  Pulmonary and Critical Care Medicine Staff Physician, [place] Director - Interstitial Lung Disease  Program  P[facility] [place] at [place], [place], [address]  NPI Number:  NPI #[id number]  Pager: [phone], If no answer  -> Check AMION or Try [phone] Telephone (clinical office): [phone] Telephone (research): [phone]  2:53 PM 10/20/2021   10/20/2021 2:53 PM    LABS     PULMONARY Recent Labs  Lab 10/17/21 0539  HCO3 29.2*  O2SAT 52.7    CBC Recent Labs  Lab 10/18/21 0735 10/19/21 0718 10/20/21 0527  HGB 13.4 13.5 13.3  HCT 40.4 41.5 40.4  WBC 13.0* 20.6* 18.3*  PLT 159 160 150    COAGULATION Recent Labs  Lab 10/16/21 0813  INR 1.3*    CARDIAC  No results for input(s): "TROPONINI" in the last 168 hours. No results for input(s): "PROBNP" in the last 168 hours.  CHEMISTRY Recent Labs  Lab 10/16/21 0813 10/16/21 0855 10/17/21 0507 10/18/21 0735 10/19/21 0718 10/20/21 0527  NA 136  --  139 141 141 139  K 3.2*  --  3.8 3.4* 4.0 3.5  CL 95*  --  103 102 103 101  CO2 22  --  $R'26 29 29 29  'GT$ GLUCOSE 142*  --  152* 229* 138* 155*  BUN 9  --  6* $R'10 12 15  'MH$ CREATININE 0.63  --  0.55* 0.66 0.70 0.66  CALCIUM 9.5  --  8.1* 8.8* 9.2 9.0  MG  --  1.7 1.9 2.2 2.2  --   PHOS  --  3.1  --  3.0  --   --    Estimated Creatinine Clearance: 96.7 mL/min (by C-G formula based on SCr of 0.66 mg/dL).   LIVER Recent Labs  Lab 10/16/21 0813 10/17/21 0507  AST 50* 43*  ALT 36 33  ALKPHOS 65 55  BILITOT 1.0 0.7  PROT 6.1* 5.2*  ALBUMIN 3.6 2.6*  INR 1.3*  --      INFECTIOUS Recent Labs  Lab 10/16/21 0827 10/16/21 0958 10/17/21 0507 10/18/21 0735 10/19/21 0537  LATICACIDVEN 1.5 1.5  --   --   --   PROCALCITON  --   --  <0.10 <0.10 <0.10  ENDOCRINE CBG (last 3)  Recent Labs    10/20/21 0345 10/20/21 0752 10/20/21 1159  GLUCAP 171* 138* 212*         IMAGING x48h  - image(s) personally visualized  -   highlighted in bold No results found.

## 2021-10-21 ENCOUNTER — Inpatient Hospital Stay (HOSPITAL_COMMUNITY): Payer: BC Managed Care – PPO

## 2021-10-21 DIAGNOSIS — Z880 Allergy status to penicillin: Secondary | ICD-10-CM

## 2021-10-21 DIAGNOSIS — A492 Hemophilus influenzae infection, unspecified site: Secondary | ICD-10-CM

## 2021-10-21 DIAGNOSIS — J45991 Cough variant asthma: Secondary | ICD-10-CM | POA: Diagnosis not present

## 2021-10-21 DIAGNOSIS — A403 Sepsis due to Streptococcus pneumoniae: Secondary | ICD-10-CM

## 2021-10-21 DIAGNOSIS — J9601 Acute respiratory failure with hypoxia: Secondary | ICD-10-CM | POA: Diagnosis not present

## 2021-10-21 DIAGNOSIS — K219 Gastro-esophageal reflux disease without esophagitis: Secondary | ICD-10-CM | POA: Diagnosis not present

## 2021-10-21 DIAGNOSIS — J189 Pneumonia, unspecified organism: Secondary | ICD-10-CM | POA: Diagnosis not present

## 2021-10-21 DIAGNOSIS — A419 Sepsis, unspecified organism: Secondary | ICD-10-CM | POA: Diagnosis not present

## 2021-10-21 LAB — CBC
HCT: 39.5 % (ref 39.0–52.0)
Hemoglobin: 13.4 g/dL (ref 13.0–17.0)
MCH: 32.1 pg (ref 26.0–34.0)
MCHC: 33.9 g/dL (ref 30.0–36.0)
MCV: 94.5 fL (ref 80.0–100.0)
Platelets: 144 10*3/uL — ABNORMAL LOW (ref 150–400)
RBC: 4.18 MIL/uL — ABNORMAL LOW (ref 4.22–5.81)
RDW: 13.4 % (ref 11.5–15.5)
WBC: 16.7 10*3/uL — ABNORMAL HIGH (ref 4.0–10.5)
nRBC: 0 % (ref 0.0–0.2)

## 2021-10-21 LAB — GLUCOSE, CAPILLARY
Glucose-Capillary: 101 mg/dL — ABNORMAL HIGH (ref 70–99)
Glucose-Capillary: 94 mg/dL (ref 70–99)
Glucose-Capillary: 114 mg/dL — ABNORMAL HIGH (ref 70–99)
Glucose-Capillary: 322 mg/dL — ABNORMAL HIGH (ref 70–99)
Glucose-Capillary: 200 mg/dL — ABNORMAL HIGH (ref 70–99)
Glucose-Capillary: 53 mg/dL — ABNORMAL LOW (ref 70–99)

## 2021-10-21 LAB — BASIC METABOLIC PANEL
Anion gap: 9 (ref 5–15)
BUN: 12 mg/dL (ref 8–23)
CO2: 32 mmol/L (ref 22–32)
Calcium: 8.7 mg/dL — ABNORMAL LOW (ref 8.9–10.3)
Chloride: 99 mmol/L (ref 98–111)
Creatinine, Ser: 0.64 mg/dL (ref 0.61–1.24)
GFR, Estimated: >60 mL/min (ref >60)
Glucose, Bld: 116 mg/dL — ABNORMAL HIGH (ref 70–99)
Potassium: 3.4 mmol/L — ABNORMAL LOW (ref 3.5–5.1)
Sodium: 140 mmol/L (ref 135–145)

## 2021-10-21 LAB — LEGIONELLA PNEUMOPHILA SEROGP 1 UR AG: L. pneumophila Serogp 1 Ur Ag: NEGATIVE

## 2021-10-21 LAB — CYCLIC CITRUL PEPTIDE ANTIBODY, IGG/IGA: CCP Antibodies IgG/IgA: <1 units (ref 0–19)

## 2021-10-21 LAB — CULTURE, BLOOD (ROUTINE X 2)
Culture: NO GROWTH
Special Requests: ADEQUATE

## 2021-10-21 MED ORDER — INSULIN GLARGINE-YFGN 100 UNIT/ML ~~LOC~~ SOLN
15.0000 [IU] | Freq: Every day | SUBCUTANEOUS | Status: DC
  Filled 2021-10-21: qty 0.15

## 2021-10-21 MED ORDER — AMOXICILLIN 250 MG PO CAPS
500.0000 mg | ORAL_CAPSULE | Freq: Once | ORAL | Status: AC
Start: 2021-10-21 — End: 2021-10-21
  Administered 2021-10-21: 500 mg via ORAL
  Filled 2021-10-21: qty 2

## 2021-10-21 MED ORDER — INSULIN GLARGINE-YFGN 100 UNIT/ML ~~LOC~~ SOLN
10.0000 [IU] | Freq: Every day | SUBCUTANEOUS | Status: DC
Start: 2021-10-21 — End: 2021-10-21
  Administered 2021-10-21: 10 [IU] via SUBCUTANEOUS
  Filled 2021-10-21: qty 0.1

## 2021-10-21 MED ORDER — EPINEPHRINE 0.3 MG/0.3ML IJ SOAJ: 0.3000 mg | Freq: Once | INTRAMUSCULAR | Status: DC | PRN

## 2021-10-21 MED ORDER — POTASSIUM CHLORIDE CRYS ER 20 MEQ PO TBCR
40.0000 meq | EXTENDED_RELEASE_TABLET | ORAL | Status: AC
  Administered 2021-10-21 (×2): 40 meq via ORAL
  Filled 2021-10-21 (×2): qty 2

## 2021-10-21 MED ORDER — DIPHENHYDRAMINE HCL 50 MG/ML IJ SOLN: 25.0000 mg | Freq: Once | INTRAMUSCULAR | Status: DC | PRN

## 2021-10-21 NOTE — Progress Notes (Signed)
Having difficulty hearing on [place]  Antibiotics:  Anti-infectives (From admission, onward)    Start     Dose/Rate Route Frequency Ordered Stop   10/21/21 1100  amoxicillin (AMOXIL) capsule 500 mg        500 mg Oral  Once 10/21/21 1005 10/21/21 1203   10/18/21 0445  cefTRIAXone (ROCEPHIN) 2 g in sodium chloride 0.9 % 100 mL IVPB        2 g 200 mL/hr over 30 Minutes Intravenous Every 24 hours 10/18/21 0345     10/17/21 1000  levofloxacin (LEVAQUIN) IVPB 750 mg  Status:  Discontinued        750 mg 100 mL/hr over 90 Minutes Intravenous  Once 10/16/21 1036 10/17/21 0435   10/17/21 1000  doxycycline (VIBRAMYCIN) 100 mg in sodium chloride 0.9 % 250 mL IVPB  Status:  Discontinued        100 mg 125 mL/hr over 120 Minutes Intravenous Every 12 hours 10/17/21 0439 10/18/21 0921   10/17/21 1000  ceFEPIme (MAXIPIME) 2 g in sodium chloride 0.9 % 100 mL IVPB  Status:  Discontinued        2 g 200 mL/hr over 30 Minutes Intravenous Every 8 hours 10/17/21 0823 10/18/21 0345   10/17/21 0930  cefTRIAXone (ROCEPHIN) 2 g in sodium chloride 0.9 % 100 mL IVPB  Status:  Discontinued        2 g 200 mL/hr over 30 Minutes Intravenous Every 24 hours 10/17/21 0439 10/17/21 0812   10/16/21 1030  levofloxacin (LEVAQUIN) IVPB 750 mg  Status:  Discontinued        750 mg 100 mL/hr over 90 Minutes Intravenous  Once 10/16/21 1020 10/16/21 1036   10/16/21 0930  azithromycin (ZITHROMAX) 500 mg in sodium chloride 0.9 % 250 mL IVPB        500 mg 250 mL/hr over 60 Minutes Intravenous  Once 10/16/21 0917 10/16/21 1104   10/16/21 0930  cefTRIAXone (ROCEPHIN) 1 g in sodium chloride 0.9 % 100 mL IVPB        1 g 200 mL/hr over 30 Minutes Intravenous  Once 10/16/21 0917 10/16/21 0952   10/16/21 0900  levofloxacin (LEVAQUIN) IVPB 750 mg  Status:  Discontinued        750 mg 100 mL/hr over 90 Minutes Intravenous  Once 10/16/21 0853 10/16/21 0916       Medications: Scheduled Meds:  allopurinol  300 mg Oral Daily    amLODipine  5 mg Oral Daily   carvedilol  3.125 mg Oral BID WC   enoxaparin (LOVENOX) injection  40 mg Subcutaneous Q24H   feeding supplement  237 mL Oral TID BM   fluticasone  1 spray Each Nare Daily   HYDROcodone bit-homatropine  5 mL Oral BID   hydrocortisone sod succinate (SOLU-CORTEF) inj  100 mg Intravenous Q12H   insulin aspart  0-15 Units Subcutaneous Q4H   insulin glargine-yfgn  10 Units Subcutaneous Daily   ipratropium-albuterol  3 mL Nebulization BID   loratadine  10 mg Oral Daily   oxymetazoline  1 spray Each Nare BID   pantoprazole  40 mg Oral Daily   sodium chloride flush  3 mL Intravenous Q12H   sodium chloride HYPERTONIC  4 mL Nebulization BID   Continuous Infusions:  cefTRIAXone (ROCEPHIN)  IV 2 g (10/21/21 0406)   PRN Meds:.acetaminophen **OR** acetaminophen, albuterol, diphenhydrAMINE, EPINEPHrine, hydrALAZINE, HYDROcodone bit-homatropine, lip balm, ondansetron **OR** ondansetron (ZOFRAN) IV, polyethylene glycol    Objective:  Weight change:   Intake/Output Summary (Last 24 hours) at 10/21/2021 1945 Last data filed at 10/21/2021 1634 Gross per 24 hour  Intake 360 ml  Output 950 ml  Net -590 ml   Blood pressure 137/89, pulse 73, temperature 98.4 F (36.9 C), temperature source Oral, resp. rate (!) 25, height '5\' 11"'$  (1.803 m), weight 84.8 kg, SpO2 93 %. Temp:  [97.6 F (36.4 C)-98.4 F (36.9 C)] 98.4 F (36.9 C) (06/19 1300) Pulse Rate:  [64-88] 73 (06/19 1300) Resp:  [17-30] 25 (06/19 1300) BP: (111-145)/(74-96) 137/89 (06/19 1300) SpO2:  [79 %-99 %] 93 % (06/19 1300) FiO2 (%):  [40 %] 40 % (06/19 0300)  Physical Exam: Physical Exam Constitutional:      Appearance: He is well-developed.  HENT:     Head: Normocephalic and atraumatic.  Eyes:     Conjunctiva/sclera: Conjunctivae normal.  Cardiovascular:     Rate and Rhythm: Normal rate and regular rhythm.  Pulmonary:     Effort: Pulmonary effort is normal. No respiratory distress.     Breath  sounds: No wheezing.  Abdominal:     General: There is no distension.     Palpations: Abdomen is soft.  Musculoskeletal:        General: Normal range of motion.     Cervical back: Normal range of motion and neck supple.  Skin:    General: Skin is warm and dry.     Findings: No erythema or rash.  Neurological:     General: No focal deficit present.     Mental Status: He is alert and oriented to person, place, and time.  Psychiatric:        Mood and Affect: Mood normal.        Behavior: Behavior normal.        Thought Content: Thought content normal.        Judgment: Judgment normal.      CBC:    BMET Recent Labs    10/20/21 0527 10/21/21 0415  NA 139 140  K 3.5 3.4*  CL 101 99  CO2 29 32  GLUCOSE 155* 116*  BUN 15 12  CREATININE 0.66 0.64  CALCIUM 9.0 8.7*     Liver Panel  No results for input(s): "PROT", "ALBUMIN", "AST", "ALT", "ALKPHOS", "BILITOT", "BILIDIR", "IBILI" in the last 72 hours.     Sedimentation Rate No results for input(s): "ESRSEDRATE" in the last 72 hours. C-Reactive Protein No results for input(s): "CRP" in the last 72 hours.  Micro Results: Recent Results (from the past 720 hour(s))  Culture, blood (Routine x 2)     Status: None   Collection Time: 10/16/21  8:13 AM   Specimen: BLOOD LEFT ARM  Result Value Ref Range Status   Specimen Description   Final    BLOOD LEFT ARM Performed at [facility], [address] [place], [place] [address]    Special Requests   Final    BOTTLES DRAWN AEROBIC AND ANAEROBIC Blood Culture adequate volume Performed at [facility], [address], [place], [place] [address]    Culture   Final    NO GROWTH 5 DAYS Performed at [facility] [address] [place], [place] [address]    Report Status 10/21/2021 FINAL  Final  Culture, blood (Routine x 2)     Status: Abnormal (Preliminary result)   Collection Time: 10/16/21  8:13 AM   Specimen: BLOOD RIGHT  ARM  Result Value Ref Range Status  Specimen Description   Final    BLOOD RIGHT ARM Performed at [facility], [address], [place] [address]    Special Requests   Final    BOTTLES DRAWN AEROBIC AND ANAEROBIC Blood Culture adequate volume Performed at [facility], [address] [place], [place] [address]    Culture  Setup Time   Final    GRAM NEGATIVE RODS ANAEROBIC BOTTLE ONLY CRITICAL RESULT CALLED TO, READ BACK BY AND VERIFIED WITH: PHARMD ELLEN JACKSON 10/18/21'@2'$ :30 BY TW    Culture (A)  Final    HAEMOPHILUS INFLUENZAE BETA LACTAMASE NEGATIVE HEALTH DEPARTMENT NOTIFIED Performed at [facility], [place] [address], [place], [place] [address]    Report Status PENDING  Incomplete  Blood Culture ID Panel (Reflexed)     Status: Abnormal   Collection Time: 10/16/21  8:13 AM  Result Value Ref Range Status   Enterococcus faecalis NOT DETECTED NOT DETECTED Final   Enterococcus Faecium NOT DETECTED NOT DETECTED Final   Listeria monocytogenes NOT DETECTED NOT DETECTED Final   Staphylococcus species NOT DETECTED NOT DETECTED Final   Staphylococcus aureus (BCID) NOT DETECTED NOT DETECTED Final   Staphylococcus epidermidis NOT DETECTED NOT DETECTED Final   Staphylococcus lugdunensis NOT DETECTED NOT DETECTED Final   Streptococcus species NOT DETECTED NOT DETECTED Final   Streptococcus agalactiae NOT DETECTED NOT DETECTED Final   Streptococcus pneumoniae NOT DETECTED NOT DETECTED Final   Streptococcus pyogenes NOT DETECTED NOT DETECTED Final   A.calcoaceticus-baumannii NOT DETECTED NOT DETECTED Final   Bacteroides fragilis NOT DETECTED NOT DETECTED Final   Enterobacterales NOT DETECTED NOT DETECTED Final   Enterobacter cloacae complex NOT DETECTED NOT DETECTED Final   Escherichia coli NOT DETECTED NOT DETECTED Final   Klebsiella aerogenes NOT DETECTED NOT DETECTED Final   Klebsiella oxytoca NOT DETECTED NOT DETECTED Final    Klebsiella pneumoniae NOT DETECTED NOT DETECTED Final   Proteus species NOT DETECTED NOT DETECTED Final   Salmonella species NOT DETECTED NOT DETECTED Final   Serratia marcescens NOT DETECTED NOT DETECTED Final   Haemophilus influenzae DETECTED (A) NOT DETECTED Final    Comment: CRITICAL RESULT CALLED TO, READ BACK BY AND VERIFIED WITH: PHARMD ELLEN JACKSON 10/18/21'@2'$ :30 BY TW    Neisseria meningitidis NOT DETECTED NOT DETECTED Final   Pseudomonas aeruginosa NOT DETECTED NOT DETECTED Final   Stenotrophomonas maltophilia NOT DETECTED NOT DETECTED Final   Candida albicans NOT DETECTED NOT DETECTED Final   Candida auris NOT DETECTED NOT DETECTED Final   Candida glabrata NOT DETECTED NOT DETECTED Final   Candida krusei NOT DETECTED NOT DETECTED Final   Candida parapsilosis NOT DETECTED NOT DETECTED Final   Candida tropicalis NOT DETECTED NOT DETECTED Final   Cryptococcus neoformans/gattii NOT DETECTED NOT DETECTED Final    Comment: Performed at [facility], [address], [place], [place] [address]  Resp Panel by RT-PCR (Flu A&B, Covid) Anterior Nasal Swab     Status: None   Collection Time: 10/16/21  8:55 AM   Specimen: Anterior Nasal Swab  Result Value Ref Range Status   SARS Coronavirus 2 by RT PCR NEGATIVE NEGATIVE Final    Comment: (NOTE) SARS-CoV-2 target nucleic acids are NOT DETECTED.  The SARS-CoV-2 RNA is generally detectable in upper respiratory specimens during the acute phase of infection. The lowest concentration of SARS-CoV-2 viral copies this assay can detect is 138 copies/mL. A negative result does not preclude SARS-Cov-2 infection and should not be used as the sole basis for  treatment or other patient management decisions. A negative result may occur with  improper specimen collection/handling, submission of specimen other than nasopharyngeal swab, presence of viral mutation(s) within the areas targeted by this assay, and inadequate number of viral copies(<138  copies/mL). A negative result must be combined with clinical observations, patient history, and epidemiological information. The expected result is Negative.  Fact Sheet for Patients:  EntrepreneurPulse.com.au  Fact Sheet for Healthcare Providers:  IncredibleEmployment.be  This test is no t yet approved or cleared by the [place] FDA and  has been authorized for detection and/or diagnosis of SARS-CoV-2 by FDA under an Emergency Use Authorization (EUA). This EUA will remain  in effect (meaning this test can be used) for the duration of the COVID-19 declaration under Section 564(b)(1) of the Act, 21 U.S.C.section 360bbb-3(b)(1), unless the authorization is terminated  or revoked sooner.       Influenza A by PCR NEGATIVE NEGATIVE Final   Influenza B by PCR NEGATIVE NEGATIVE Final    Comment: (NOTE) The Xpert Xpress SARS-CoV-2/FLU/RSV plus assay is intended as an aid in the diagnosis of influenza from Nasopharyngeal swab specimens and should not be used as a sole basis for treatment. Nasal washings and aspirates are unacceptable for Xpert Xpress SARS-CoV-2/FLU/RSV testing.  Fact Sheet for Patients: EntrepreneurPulse.com.au  Fact Sheet for Healthcare Providers: IncredibleEmployment.be  This test is not yet approved or cleared by the [place] FDA and has been authorized for detection and/or diagnosis of SARS-CoV-2 by FDA under an Emergency Use Authorization (EUA). This EUA will remain in effect (meaning this test can be used) for the duration of the COVID-19 declaration under Section 564(b)(1) of the Act, 21 U.S.C. section 360bbb-3(b)(1), unless the authorization is terminated or revoked.  Performed at [facility], [address], [place], [place] [address]   Respiratory (~20 pathogens) panel by PCR     Status: Abnormal   Collection Time: 10/16/21 10:50 AM   Specimen:  Nasopharyngeal Swab; Respiratory  Result Value Ref Range Status   Adenovirus NOT DETECTED NOT DETECTED Final   Coronavirus 229E NOT DETECTED NOT DETECTED Final    Comment: (NOTE) The Coronavirus on the Respiratory Panel, DOES NOT test for the novel  Coronavirus (2019 nCoV)    Coronavirus HKU1 NOT DETECTED NOT DETECTED Final   Coronavirus NL63 NOT DETECTED NOT DETECTED Final   Coronavirus OC43 NOT DETECTED NOT DETECTED Final   Metapneumovirus NOT DETECTED NOT DETECTED Final   Rhinovirus / Enterovirus NOT DETECTED NOT DETECTED Final   Influenza A NOT DETECTED NOT DETECTED Final   Influenza B NOT DETECTED NOT DETECTED Final   Parainfluenza Virus 1 NOT DETECTED NOT DETECTED Final   Parainfluenza Virus 2 NOT DETECTED NOT DETECTED Final   Parainfluenza Virus 3 DETECTED (A) NOT DETECTED Final   Parainfluenza Virus 4 NOT DETECTED NOT DETECTED Final   Respiratory Syncytial Virus NOT DETECTED NOT DETECTED Final   Bordetella pertussis NOT DETECTED NOT DETECTED Final   Bordetella Parapertussis NOT DETECTED NOT DETECTED Final   Chlamydophila pneumoniae NOT DETECTED NOT DETECTED Final   Mycoplasma pneumoniae NOT DETECTED NOT DETECTED Final    Comment: Performed at [facility], [address], [place], [place] [address]  Urine Culture     Status: None   Collection Time: 10/16/21 11:20 AM   Specimen: In/Out Cath Urine  Result Value Ref Range Status   Specimen Description   Final    IN/OUT CATH URINE Performed at [facility], [address]  [place], [place] [address]    Special Requests   Final    NONE Performed at [facility], [address] [place], [place] [address]    Culture   Final    NO GROWTH Performed at [facility], [place] [address] [place], [place] [address]    Report Status 10/17/2021 FINAL  Final  Expectorated Sputum Assessment w Gram Stain, Rflx to Resp Cult     Status: None   Collection Time: 10/17/21  4:31 AM    Specimen: Sputum  Result Value Ref Range Status   Specimen Description SPUTUM  Final   Special Requests NONE  Final   Sputum evaluation   Final    THIS SPECIMEN IS ACCEPTABLE FOR SPUTUM CULTURE Performed at [facility], [place] [address] [place], [place] [address]    Report Status 10/17/2021 FINAL  Final  Culture, Respiratory w Gram Stain     Status: None   Collection Time: 10/17/21  4:31 AM   Specimen: SPU  Result Value Ref Range Status   Specimen Description   Final    SPUTUM Performed at [facility] [address] [place], [place] [address]    Special Requests   Final    NONE Reflexed from [id number] Performed at [facility], [place] [address] [place], [place] [address]    Gram Stain   Final    ABUNDANT WBC PRESENT, PREDOMINANTLY PMN FEW GRAM POSITIVE COCCI FEW GRAM NEGATIVE RODS FEW GRAM POSITIVE RODS    Culture   Final    RARE Normal respiratory flora-no Staph aureus or Pseudomonas seen Performed at [facility], [address] [place], [place] [address]    Report Status 10/19/2021 FINAL  Final  MRSA Next Gen by PCR, Nasal     Status: None   Collection Time: 10/17/21  5:35 PM   Specimen: Nasal Mucosa; Nasal Swab  Result Value Ref Range Status   MRSA by PCR Next Gen NOT DETECTED NOT DETECTED Final    Comment: (NOTE) The GeneXpert MRSA Assay (FDA approved for NASAL specimens only), is one component of a comprehensive MRSA colonization surveillance program. It is not intended to diagnose MRSA infection nor to guide or monitor treatment for MRSA infections. Test performance is not FDA approved in patients less than 84 years old. Performed at [facility], [place] [address], [place], [place] [address]     Studies/Results: DG Chest 2 View  Result Date: 10/21/2021 CLINICAL DATA:  Shortness of breath.  Pneumonia and sepsis. EXAM: CHEST - 2 VIEW COMPARISON:  10/17/2021 FINDINGS: Normal heart  size and mediastinal contours. No signs of pleural effusion. Bilateral areas of subsegmental atelectasis noted. Interval improvement in bilateral lower lung zone predominant reticular and nodular pulmonary opacities. Residual opacities are noted within the retrocardiac left lower lobe and right middle lobe. IMPRESSION: Improved aeration to both lungs with residual left base and right midlung airspace opacities. Electronically Signed   By: Kerby Moors M.D.   On: 10/21/2021 14:51      Assessment/Plan:  INTERVAL HISTORY: no new events    Principal Problem:   Sepsis due to pneumonia [facility]) Active Problems:   GERD without esophagitis   Gout   Cough variant asthma vs UACS   Type 2 diabetes mellitus without complication, without long-term current use of insulin (HCC)   Pneumonia of both lungs due to infectious organism   Hypokalemia   Acute respiratory failure with hypoxia ([place])  Malnutrition of moderate degree    Joseph Powell is a 66 y.o. 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  # H flu pneumonia and bacteremia:  I will give him dose number 5 of 2 grams of ceftriaxone tomorrow and if he is doing well convert him to high dose amoxicillin  or amo/clav to complete a 7 day course  #2 PCN: allergy we administered amoxicillin challenge which she seems to have tolerated without problems   LOS: 5 days   Alcide Evener 10/21/2021, 7:45 PM

## 2021-10-21 NOTE — Progress Notes (Signed)
Amoxicillin started this am. The patient tolerated dose well. No change in VS. No symptoms reported by the pt.

## 2021-10-21 NOTE — Progress Notes (Signed)
Discussed hx of penicillin allergy (rash) with patient. He does not remember this reaction from childhood and claims to have tolerated amoxicillin in the past. Patient is willing to try an oral amoxicillin challenge today. RN and MD aware.   Joseph Powell, PharmD Pharmacy Resident 10/21/2021, 10:43 AM

## 2021-10-21 NOTE — TOC Progression Note (Addendum)
Transition of Care [facility]) - Progression Note    Patient Details  Name: Joseph Powell MRN: [id number] Date of Birth: August 21, 1955  Transition of Care [facility]) CM/SW Contact  Carolena Fairbank, [name], RN Phone Number: 10/21/2021, 12:39 PM  Clinical Narrative: Per attending bipap likely not needed. Otpt PT referral placed. [employer] following if home 02 needed-home 02 ordered,documentation on 6/18 appropriate if d/c by tomorrow.     Expected Discharge Plan: Home/Self Care Barriers to Discharge: Continued Medical Work up  Expected Discharge Plan and Services Expected Discharge Plan: Home/Self Care   Discharge Planning Services: CM Consult   Living arrangements for the past 2 months: Single Family Home                                       Social Determinants of Health (SDOH) Interventions    Readmission Risk Interventions     No data to display

## 2021-10-21 NOTE — Progress Notes (Signed)
NAME:  Joseph Powell, MRN:  [id number], DOB:  1955/07/03, LOS: 5 ADMISSION DATE:  10/16/2021, CONSULTATION DATE:  10/17/21 REFERRING MD:  Dr Tyrell Antonio, CHIEF COMPLAINT:  Acute hypoxemic resp virust - PArainfleunza 3    BRIEF  Joseph Powell is a 66 year old male who is status post [facility] and stem cell transplant for history of lymphoma T-cell non-Hodgkin's believed to be in complete remission in 2019.  He follows with Dr. Christinia Gully for chronic cough asthma and allergies.  2018 had normal spirometry and allergy profile in September 2022 with normal IgE and no eosinophils in 2023 exhaled nitric oxide was normal.  Most recent chest x-ray was in April 2023 with peribronchial thickening but no infiltrates [in 2019 did have a CT scan of the chest that had a left lower lobe infiltrate] most recent pulmonary clinic follow-up April 2023 showed improvement in cough with short course prednisone.  Consider this cough variant asthma  Now in the middle of May 2023 he went on a remote close and also travel to [place], [place], [place].  1 week into this day because of lack of air conditioning a left is with the room open and had a fan blowing into his face.  Shortly after that he and his wife got sick.  This was around May 27/28 2023.  Then on October 03, 2021 he was evaluated by a physician and then again also on October 08, 2021.  At no point his pulse ox was checked.  He was given azithromycin.  His wife recovered with the same antibiotic but [place] starting her on October 08, 2021 was actually getting short of breath.  He arrived back in [place] on November 01, 2021.  By this time he felt like he was hypoxemic and also short of breath with minimal exertion and having significant cough.  Presented to emergency room 10/16/2021 found to be acute hypoxemic respiratory failure.  Had bilateral lower lobe pulmonary infiltrates.  White count was elevated.  Started on Antibiotics but procalcitonin normal.  Also given  fluids.  If progressive worsening hypoxemia in the emergency department admitted to progressive unit at [facility].  Pulmonary critical care consulted morning of 10/17/2021 requiring 25 L high flow nasal cannula.  Desaturations with cough noted.  Some associated malaise and poor appetite.  No chest pains.  The cough is dry.  No edema no hemoptysis.  CT angiogram dated, troponin, D-dimer, echo data not available.  No urine strep or Legionella.  He is positive for parainfluenza virus 3.   Past Medical History:    has a past medical history of Allergic rhinitis, Asthma, Bell's palsy, GERD (gastroesophageal reflux disease), Gout, Hypercholesteremia, Melanoma ([place]), Osteoarthritis, Sleep apnea, Thrombocytopenia ([place]), and Tubular adenoma.   has a past surgical history that includes Knee surgery (1993); Vasectomy (1999); Nasal reconstruction; Melanoma excision; IR US Guide Vasc Access Right (02/17/2017); IR FLUORO GUIDE PORT INSERTION RIGHT (02/17/2017); and IR REMOVAL TUN ACCESS W/ PORT W/O FL MOD SED (01/05/2018).    Significant Hospital Events:  10/16/2021 - admit. PIV 3+ 6/15 - ccm consut. 25L . Start BiPAP x 2 in day with QHS and IV Steroid Solumedrol  - CTA - neg PE  - BCID /14 wirh H Flu  - QUant gold -    - Urine strep - neg  - coviud IgG - neg  = HIV neg  - PCT < 0.1  - ESR 32  - ECHO - LVEF 65%. Normal DDx. Mild reduced  RV Function  - ANA/RF - 6/16 - NEG  - BCID - H Flu  6/16- di dnot toelrated BiPAP last night.  He says nurses thought he was anxious. He denies it. Says the BiPAP produced no air. He is willing to try BiPAP this AM. REmains on 25L HFNC . BCID with H Flu. Abx being narrowed to ceftriaxone. Coughs and when sat our of bed on 25L [place] - desat to 85%-88%.  Movements produce cough.  START 235m per day IV hydrocort  6/17 - 6/17 - Used BiPAP last night. BP high. On hydrocort 2032mper day for CAP (D3 of steroids, D2 hydrocor), Now on 20L HFNC with 40% fio2. AFebrile.   And by Afetrnoon down to 15L [place] -> in room turned down to 30% fio2/10L an dstill pulse ox 99%. Coughed and did not desat . Wife reports  - HOH since illness started. On flonase. ENT consult pending per her  - Low appetite  SUBJECTIVE/OVERNIGHT/INTERVAL HX  Joseph Powell feeling better slowly. He continues to have a productive cough. He was afebrile overnight.  Objective   Blood pressure 138/85, pulse 73, temperature 98 F (36.7 C), temperature source Oral, resp. rate (!) 30, height 5' 11" (1.803 m), weight 84.8 kg, SpO2 98 %.    FiO2 (%):  [40 %] 40 %   Intake/Output Summary (Last 24 hours) at 10/21/2021 1301 Last data filed at 10/21/2021 1100 Gross per 24 hour  Intake 480 ml  Output 1250 ml  Net -770 ml    Filed Weights   10/16/21 0800  Weight: 84.8 kg   General Appearance: ill appearing man lying in bed in NAD HEENT: [place]/AT, eyes anicteric Neck: Supple Lungs: Breathing comfortably on nasal cannula, 7L.  Faint rales bilaterally.  No wheezing. Heart: S1-S2, regular rate and rhythm Abdomen: soft, nontender, nondistended Extremities: No peripheral edema, no cyanosis Skin: Warm, dry, no diffuse rashes Neurologic: Mildly hard of hearing, but comprehension appears intact, normal speech.  Moving all extremities spontaneously.  No focal deficits.  WBC 16.7 CXR personally reviewed-improving bilateral opacities  Assessment & Plan:   History of chronic cough secondary to upper airway cough syndrome Acute hypoxic respiratory failure with bilateral viral pneumonia due to parainfluenza virus -Steroids, can transition to Solu-Medrol if needed - Agree with Hycodan cough syrup - Can stop hypertonic saline if this is making his cough worse as it may cause bronchospasm.  Butyryl every 4. -Loratadine and pantoprazole for upper airway cough syndrome -IS -BiPAP as needed -Anticipate recovery will occur over several weeks.  May eventually need to go home on low-dose oxygen and be weaned as an  outpatient.   Bacteremia due to H influenza -Ceftriaxone   Physical deconditioning - onset in hospital -OOB mobility, up to chair, walking as able  Thrombocytopenia likely due to antibiotics - Monitor  Followup  - APP Tammy followup (she knows him) -> then Dr RaChase Callerswitching from Dr WEMelvyn Novas; office sent a message - Needs OPD CT Scan in 3 months   We will follow-up again on 6/21.  Please call in the interim with questions.  Wife updated at bedside.  Best practice (daily eval):  Per primary  Goals of Care:       LABS     CBC Recent Labs  Lab 10/19/21 0718 10/20/21 0527 10/21/21 0415  HGB 13.5 13.3 13.4  HCT 41.5 40.4 39.5  WBC 20.6* 18.3* 16.7*  PLT 160 150 144*       CHEMISTRY Recent Labs  Lab 10/16/21 0855 10/17/21 0507 10/18/21 0735 10/19/21 0718 10/20/21 0527 10/21/21 0415  NA  --  139 141 141 139 140  K  --  3.8 3.4* 4.0 3.5 3.4*  CL  --  103 102 103 101 99  CO2  --  _0 32  GLUCOSE  --  152* 229* 138* 155* 116*  BUN  --  6* _1 CREATININE  --  0.55* 0.66 0.70 0.66 0.64  CALCIUM  --  8.1* 8.8* 9.2 9.0 8.7*  MG 1.7 1.9 2.2 2.2  --   --   PHOS 3.1  --  3.0  --   --   --      Julian Hy, DO 10/21/21 4:39 PM [facility]

## 2021-10-21 NOTE — Progress Notes (Signed)
PROGRESS NOTE    Joseph Powell  WEX:[id number] DOB: 20-Aug-1955 DOA: 10/16/2021 PCP: Faustino Congress, NP   Brief Narrative: 66 year old with past medical history significant for lymphoma diagnosed 2018, had stem cell transplant 2019, has done well since that time.  Followed by oncologist at [facility], last evaluation August 16, 2021, patient is in remission.  Patient present with worsening shortness of breath, and fevers.  He just returned from a trip from [place], 6/13. He developed shortness of breath and congestion, June first.  During these illness he received azithromycin initially for 3 days then subsequently extended to 7 days.  He presents with worsening shortness of breath, cough.    He was found to be hypoxic oxygen saturation 86 on room air, he required up to 9 L high flow oxygen. Chest x ray: Background pattern of scattered pulmonary scaring.  Superimposed acute bronchial thickening and patchy pulmonary infiltrates in the mid and lower lungs consistent with bronchopneumonia.  Patient found to have Haemophilus Influenzae  Bacteremia, para-influenza 3. Bacterial PNA. Acute hypoxic respiratory failure.    Assessment & Plan:   Principal Problem:   Sepsis due to pneumonia [facility]) Active Problems:   Pneumonia of both lungs due to infectious organism   Acute respiratory failure with hypoxia ([place])   Type 2 diabetes mellitus without complication, without long-term current use of insulin (HCC)   Cough variant asthma vs UACS   Hypokalemia   GERD without esophagitis   Gout   Malnutrition of moderate degree  1-Sepsis secondary to pneumonia: Haemophilus Influenzae Bacteremia  Patient presents with acute hypoxic respiratory failure, leukocytosis, tachypnea,tachycardia/ in setting of PNA, parainfluenza, concern for superimpose bacterial infection.  -Parainfluenza 3 Positive.  -Blood culture. Haemophilus Influenzae -Continue with IV ceftriaxone.  -Sputum culture: rare respiratory flora,  legionella antigen Negative, strep pneumonia negative. MRSA PCR> negative -CTA negative for PE>  -ID consulted. Recommended continue with IV ceftriaxone. Follow further recommendations.  -WBC down 16. IV steroid contributing to leukocytosis.    Acute hypoxic respiratory failure secondary to pneumonia, : Bilateral Pneumonia; Parainfluenza and Haemophilus Influenzae PNA -Patent requiring HFNC Oxygen  25 L 50 % on admission.  -CCM consulted. ANA negative, RF: 13. Cyclic Citruli pending.  -Continue with nebulizer.  -treated with BIPAP during hospitalization.  Continue with IV Ceftriaxone.  -Elevation BNP-- ECHO normal EF, Low normal RV function in setting of PNA>  -On 200 mg IV hydrocortisone for 5 days /day 4. Then he will need another taper over 5 days.  -Yesterday Oxygen requirement down to 4 L, increase to 6--7 after ambulation.  Today he had coughing spell, and oxygen decreased to 85. Increase to 10L.  Plan to repeat Chest x ray: Which showed improved aeration to both lungs with residual left base and right mid lung airspace opacities.  Appreciate Pulmonologist follow up./   Diabetes type 2: SSI.  Started  Long acting insulin while on steroids.  A1c 6.6. Suspect elevated CBG in setting of steroids.   Cough variant asthma: Continue with nebulizer.   Hypokalemia: Replete with Kcl.  Suspect Otitis/Hearing loss; suspect related to viral infection/Haemophilus influenzae. . Claritin/  Hearing loss persist. Appreciate Dr Constance Holster evaluation. Follow up Out patient.   Gout: Continue with Allopurinol.   Moderate circumferential thickening of the distal esophagus by CT; Started PPI. Needs follow up when he recovers from PNA with gastroenterologist.   HTN; continue with Carvedilol. Started Norvasc. Sugden Hydralazine.  BP better controlled.   Elevated D dimer: CTA negative for PE. Doppler LE; Negative  for DVT.  Nutrition; poor appetite.  Started ensure. Snacks.  Nutrition consultation.    Malar rash; for completeness Double strand DNA> ordered, pending.   Estimated body mass index is 26.08 kg/m as calculated from the following:   Height as of this encounter: '5\' 11"'$  (1.803 m).   Weight as of this encounter: 84.8 kg.   DVT prophylaxis: Lovenox Code Status: Full code Family Communication: Wife over phone while I was in patient room.  Disposition Plan:  Status is: Inpatient Remains inpatient appropriate because: remain inpatient for management of Resp failure.     Consultants:  CCM  Procedures:  ECHO EF 65 %  Antimicrobials:  Cefepime, Doxy   Subjective: After he walk yesterday his oxygen requirement increase from 4L to 6--7 L.  Early during day Oxygen sat 95 on 8L---- decreased down to 4L on dr Chase Caller round.  This morning he had coughing spell and Oxygen decreased to 86 %--Nurse increase oxygen to 10L.    Objective: Vitals:   10/21/21 0800 10/21/21 0900 10/21/21 0932 10/21/21 0938  BP: (!) 111/96  123/79 123/79  Pulse: 65  74   Resp: 19  19   Temp: 98.4 F (36.9 C) 98.4 F (36.9 C)    TempSrc: Oral     SpO2: 98%  91%   Weight:      Height:        Intake/Output Summary (Last 24 hours) at 10/21/2021 0949 Last data filed at 10/20/2021 1800 Gross per 24 hour  Intake 840 ml  Output 950 ml  Net -110 ml    Filed Weights   10/16/21 0800  Weight: 84.8 kg    Examination:  General exam: NAD Respiratory system: BL crackles.  Cardiovascular system: S 1, S 2 RRR Gastrointestinal system: BS present, soft, nt Central nervous system: Alert, follows command Extremities: No edema    Data Reviewed: I have personally reviewed following labs and imaging studies  CBC: Recent Labs  Lab 10/16/21 0813 10/17/21 0736 10/18/21 0735 10/19/21 0718 10/20/21 0527 10/21/21 0415  WBC 29.5* 17.7* 13.0* 20.6* 18.3* 16.7*  NEUTROABS 21.1* 12.2* 10.1*  --  13.7*  --   HGB 13.5 12.1* 13.4 13.5 13.3 13.4  HCT 39.8 37.0* 40.4 41.5 40.4 39.5  MCV 92.1 97.1  96.4 97.6 96.2 94.5  PLT 150 125* 159 160 150 144*    Basic Metabolic Panel: Recent Labs  Lab 10/16/21 0855 10/17/21 0507 10/18/21 0735 10/19/21 0718 10/20/21 0527 10/21/21 0415  NA  --  139 141 141 139 140  K  --  3.8 3.4* 4.0 3.5 3.4*  CL  --  103 102 103 101 99  CO2  --  '26 29 29 29 '$ 32  GLUCOSE  --  152* 229* 138* 155* 116*  BUN  --  6* '10 12 15 12  '$ CREATININE  --  0.55* 0.66 0.70 0.66 0.64  CALCIUM  --  8.1* 8.8* 9.2 9.0 8.7*  MG 1.7 1.9 2.2 2.2  --   --   PHOS 3.1  --  3.0  --   --   --     GFR: Estimated Creatinine Clearance: 96.7 mL/min (by C-G formula based on SCr of 0.64 mg/dL). Liver Function Tests: Recent Labs  Lab 10/16/21 0813 10/17/21 0507  AST 50* 43*  ALT 36 33  ALKPHOS 65 55  BILITOT 1.0 0.7  PROT 6.1* 5.2*  ALBUMIN 3.6 2.6*    No results for input(s): "LIPASE", "AMYLASE" in the last 168 hours. No  results for input(s): "AMMONIA" in the last 168 hours. Coagulation Profile: Recent Labs  Lab 10/16/21 0813  INR 1.3*    Cardiac Enzymes: No results for input(s): "CKTOTAL", "CKMB", "CKMBINDEX", "TROPONINI" in the last 168 hours. BNP (last 3 results) No results for input(s): "PROBNP" in the last 8760 hours. HbA1C: No results for input(s): "HGBA1C" in the last 72 hours.  CBG: Recent Labs  Lab 10/20/21 1623 10/20/21 2020 10/20/21 2322 10/21/21 0414 10/21/21 0729  GLUCAP 160* 90 103* 101* 94    Lipid Profile: No results for input(s): "CHOL", "HDL", "LDLCALC", "TRIG", "CHOLHDL", "LDLDIRECT" in the last 72 hours. Thyroid Function Tests: No results for input(s): "TSH", "T4TOTAL", "FREET4", "T3FREE", "THYROIDAB" in the last 72 hours. Anemia Panel: No results for input(s): "VITAMINB12", "FOLATE", "FERRITIN", "TIBC", "IRON", "RETICCTPCT" in the last 72 hours. Sepsis Labs: Recent Labs  Lab 10/16/21 0827 10/16/21 0958 10/17/21 0507 10/18/21 0735 10/19/21 0537  PROCALCITON  --   --  <0.10 <0.10 <0.10  LATICACIDVEN 1.5 1.5  --   --   --       Recent Results (from the past 240 hour(s))  Culture, blood (Routine x 2)     Status: None   Collection Time: 10/16/21  8:13 AM   Specimen: BLOOD LEFT ARM  Result Value Ref Range Status   Specimen Description   Final    BLOOD LEFT ARM Performed at [facility], [address] [place], [place] [address]    Special Requests   Final    BOTTLES DRAWN AEROBIC AND ANAEROBIC Blood Culture adequate volume Performed at [facility], [address], [place], [place] [address]    Culture   Final    NO GROWTH 5 DAYS Performed at [facility], [place] [address], [place], [place] [address]    Report Status 10/21/2021 FINAL  Final  Culture, blood (Routine x 2)     Status: Abnormal (Preliminary result)   Collection Time: 10/16/21  8:13 AM   Specimen: BLOOD RIGHT ARM  Result Value Ref Range Status   Specimen Description   Final    BLOOD RIGHT ARM Performed at [facility], [address], [place], [place] [address]    Special Requests   Final    BOTTLES DRAWN AEROBIC AND ANAEROBIC Blood Culture adequate volume Performed at [facility], [address] [place], [place] [address]    Culture  Setup Time   Final    GRAM NEGATIVE RODS ANAEROBIC BOTTLE ONLY CRITICAL RESULT CALLED TO, READ BACK BY AND VERIFIED WITH: PHARMD ELLEN JACKSON 10/18/21'@2'$ :30 BY TW    Culture (A)  Final    HAEMOPHILUS INFLUENZAE BETA LACTAMASE NEGATIVE HEALTH DEPARTMENT NOTIFIED Performed at [facility], [place] [address] [place], [place] [address]    Report Status PENDING  Incomplete  Blood Culture ID Panel (Reflexed)     Status: Abnormal   Collection Time: 10/16/21  8:13 AM  Result Value Ref Range Status   Enterococcus faecalis NOT DETECTED NOT DETECTED Final   Enterococcus Faecium NOT DETECTED NOT DETECTED Final   Listeria monocytogenes NOT DETECTED NOT DETECTED Final   Staphylococcus species NOT DETECTED NOT DETECTED  Final   Staphylococcus aureus (BCID) NOT DETECTED NOT DETECTED Final   Staphylococcus epidermidis NOT DETECTED NOT DETECTED Final   Staphylococcus lugdunensis NOT DETECTED NOT DETECTED Final   Streptococcus species NOT DETECTED NOT DETECTED Final   Streptococcus agalactiae NOT DETECTED NOT DETECTED Final   Streptococcus pneumoniae NOT DETECTED NOT DETECTED Final  Streptococcus pyogenes NOT DETECTED NOT DETECTED Final   A.calcoaceticus-baumannii NOT DETECTED NOT DETECTED Final   Bacteroides fragilis NOT DETECTED NOT DETECTED Final   Enterobacterales NOT DETECTED NOT DETECTED Final   Enterobacter cloacae complex NOT DETECTED NOT DETECTED Final   Escherichia coli NOT DETECTED NOT DETECTED Final   Klebsiella aerogenes NOT DETECTED NOT DETECTED Final   Klebsiella oxytoca NOT DETECTED NOT DETECTED Final   Klebsiella pneumoniae NOT DETECTED NOT DETECTED Final   Proteus species NOT DETECTED NOT DETECTED Final   Salmonella species NOT DETECTED NOT DETECTED Final   Serratia marcescens NOT DETECTED NOT DETECTED Final   Haemophilus influenzae DETECTED (A) NOT DETECTED Final    Comment: CRITICAL RESULT CALLED TO, READ BACK BY AND VERIFIED WITH: PHARMD ELLEN JACKSON 10/18/21'@2'$ :30 BY TW    Neisseria meningitidis NOT DETECTED NOT DETECTED Final   Pseudomonas aeruginosa NOT DETECTED NOT DETECTED Final   Stenotrophomonas maltophilia NOT DETECTED NOT DETECTED Final   Candida albicans NOT DETECTED NOT DETECTED Final   Candida auris NOT DETECTED NOT DETECTED Final   Candida glabrata NOT DETECTED NOT DETECTED Final   Candida krusei NOT DETECTED NOT DETECTED Final   Candida parapsilosis NOT DETECTED NOT DETECTED Final   Candida tropicalis NOT DETECTED NOT DETECTED Final   Cryptococcus neoformans/gattii NOT DETECTED NOT DETECTED Final    Comment: Performed at [facility], [address] [place], [place] [address]  Resp Panel by RT-PCR (Flu A&B, Covid) Anterior Nasal Swab     Status: None    Collection Time: 10/16/21  8:55 AM   Specimen: Anterior Nasal Swab  Result Value Ref Range Status   SARS Coronavirus 2 by RT PCR NEGATIVE NEGATIVE Final    Comment: (NOTE) SARS-CoV-2 target nucleic acids are NOT DETECTED.  The SARS-CoV-2 RNA is generally detectable in upper respiratory specimens during the acute phase of infection. The lowest concentration of SARS-CoV-2 viral copies this assay can detect is 138 copies/mL. A negative result does not preclude SARS-Cov-2 infection and should not be used as the sole basis for treatment or other patient management decisions. A negative result may occur with  improper specimen collection/handling, submission of specimen other than nasopharyngeal swab, presence of viral mutation(s) within the areas targeted by this assay, and inadequate number of viral copies(<138 copies/mL). A negative result must be combined with clinical observations, patient history, and epidemiological information. The expected result is Negative.  Fact Sheet for Patients:  EntrepreneurPulse.com.au  Fact Sheet for Healthcare Providers:  IncredibleEmployment.be  This test is no t yet approved or cleared by the [place] FDA and  has been authorized for detection and/or diagnosis of SARS-CoV-2 by FDA under an Emergency Use Authorization (EUA). This EUA will remain  in effect (meaning this test can be used) for the duration of the COVID-19 declaration under Section 564(b)(1) of the Act, 21 U.S.C.section 360bbb-3(b)(1), unless the authorization is terminated  or revoked sooner.       Influenza A by PCR NEGATIVE NEGATIVE Final   Influenza B by PCR NEGATIVE NEGATIVE Final    Comment: (NOTE) The Xpert Xpress SARS-CoV-2/FLU/RSV plus assay is intended as an aid in the diagnosis of influenza from Nasopharyngeal swab specimens and should not be used as a sole basis for treatment. Nasal washings and aspirates are unacceptable for  Xpert Xpress SARS-CoV-2/FLU/RSV testing.  Fact Sheet for Patients: EntrepreneurPulse.com.au  Fact Sheet for Healthcare Providers: IncredibleEmployment.be  This test is not yet approved or cleared by the [place] and has been authorized  for detection and/or diagnosis of SARS-CoV-2 by FDA under an Emergency Use Authorization (EUA). This EUA will remain in effect (meaning this test can be used) for the duration of the COVID-19 declaration under Section 564(b)(1) of the Act, 21 U.S.C. section 360bbb-3(b)(1), unless the authorization is terminated or revoked.  Performed at [facility], [address], [place], [place] [address]   Respiratory (~20 pathogens) panel by PCR     Status: Abnormal   Collection Time: 10/16/21 10:50 AM   Specimen: Nasopharyngeal Swab; Respiratory  Result Value Ref Range Status   Adenovirus NOT DETECTED NOT DETECTED Final   Coronavirus 229E NOT DETECTED NOT DETECTED Final    Comment: (NOTE) The Coronavirus on the Respiratory Panel, DOES NOT test for the novel  Coronavirus (2019 nCoV)    Coronavirus HKU1 NOT DETECTED NOT DETECTED Final   Coronavirus NL63 NOT DETECTED NOT DETECTED Final   Coronavirus OC43 NOT DETECTED NOT DETECTED Final   Metapneumovirus NOT DETECTED NOT DETECTED Final   Rhinovirus / Enterovirus NOT DETECTED NOT DETECTED Final   Influenza A NOT DETECTED NOT DETECTED Final   Influenza B NOT DETECTED NOT DETECTED Final   Parainfluenza Virus 1 NOT DETECTED NOT DETECTED Final   Parainfluenza Virus 2 NOT DETECTED NOT DETECTED Final   Parainfluenza Virus 3 DETECTED (A) NOT DETECTED Final   Parainfluenza Virus 4 NOT DETECTED NOT DETECTED Final   Respiratory Syncytial Virus NOT DETECTED NOT DETECTED Final   Bordetella pertussis NOT DETECTED NOT DETECTED Final   Bordetella Parapertussis NOT DETECTED NOT DETECTED Final   Chlamydophila pneumoniae NOT DETECTED NOT DETECTED Final    Mycoplasma pneumoniae NOT DETECTED NOT DETECTED Final    Comment: Performed at [facility], [address] [place], [place] [address]  Urine Culture     Status: None   Collection Time: 10/16/21 11:20 AM   Specimen: In/Out Cath Urine  Result Value Ref Range Status   Specimen Description   Final    IN/OUT CATH URINE Performed at [facility], [address] [place], [place] [address]    Special Requests   Final    NONE Performed at [facility], [address] [place], [place] [address]    Culture   Final    NO GROWTH Performed at [facility], [place] [address], [place], [place] [address]    Report Status 10/17/2021 FINAL  Final  Expectorated Sputum Assessment w Gram Stain, Rflx to Resp Cult     Status: None   Collection Time: 10/17/21  4:31 AM   Specimen: Sputum  Result Value Ref Range Status   Specimen Description SPUTUM  Final   Special Requests NONE  Final   Sputum evaluation   Final    THIS SPECIMEN IS ACCEPTABLE FOR SPUTUM CULTURE Performed at [facility], [place] [address], [place], [place] [address]    Report Status 10/17/2021 FINAL  Final  Culture, Respiratory w Gram Stain     Status: None   Collection Time: 10/17/21  4:31 AM   Specimen: SPU  Result Value Ref Range Status   Specimen Description   Final    SPUTUM Performed at [facility] [address], [place], [place] [address]    Special Requests   Final    NONE Reflexed from [id number] Performed at [facility], [place] [address] [place], [place] [address]    Gram Stain   Final    ABUNDANT WBC PRESENT, PREDOMINANTLY PMN FEW  GRAM POSITIVE COCCI FEW GRAM NEGATIVE RODS FEW GRAM POSITIVE RODS    Culture   Final    RARE Normal respiratory flora-no Staph aureus or Pseudomonas seen Performed at [facility], [address], [place], [place] [address]    Report Status 10/19/2021 FINAL  Final  MRSA Next  Gen by PCR, Nasal     Status: None   Collection Time: 10/17/21  5:35 PM   Specimen: Nasal Mucosa; Nasal Swab  Result Value Ref Range Status   MRSA by PCR Next Gen NOT DETECTED NOT DETECTED Final    Comment: (NOTE) The GeneXpert MRSA Assay (FDA approved for NASAL specimens only), is one component of a comprehensive MRSA colonization surveillance program. It is not intended to diagnose MRSA infection nor to guide or monitor treatment for MRSA infections. Test performance is not FDA approved in patients less than 24 years old. Performed at [facility], [place] [address] [place], [place] [address]          Radiology Studies: No results found.      Scheduled Meds:  allopurinol  300 mg Oral Daily   amLODipine  5 mg Oral Daily   carvedilol  3.125 mg Oral BID WC   enoxaparin (LOVENOX) injection  40 mg Subcutaneous Q24H   feeding supplement  237 mL Oral TID BM   fluticasone  1 spray Each Nare Daily   HYDROcodone bit-homatropine  5 mL Oral BID   hydrocortisone sod succinate (SOLU-CORTEF) inj  100 mg Intravenous Q12H   insulin aspart  0-15 Units Subcutaneous Q4H   insulin glargine-yfgn  10 Units Subcutaneous Daily   ipratropium-albuterol  3 mL Nebulization BID   loratadine  10 mg Oral Daily   oxymetazoline  1 spray Each Nare BID   pantoprazole  40 mg Oral Daily   potassium chloride  40 mEq Oral Q4H   sodium chloride flush  3 mL Intravenous Q12H   sodium chloride HYPERTONIC  4 mL Nebulization BID   Continuous Infusions:  cefTRIAXone (ROCEPHIN)  IV 2 g (10/21/21 0406)     LOS: 5 days    Time spent: 35 minutes.     Elmarie Shiley, MD [facility]   If 7PM-7AM, please contact night-coverage [email or web address]  10/21/2021, 9:49 AM

## 2021-10-21 NOTE — Progress Notes (Signed)
The patient had an episode of coughing and dyspnea. Oxygen Saturation decreased to 84-86% on HFNC. O2 increased to 10 Lt HFNC, sat 92 %. Will continue to monitor. Provider updated. Pt reports feeling better now, no distress.

## 2021-10-22 DIAGNOSIS — Z880 Allergy status to penicillin: Secondary | ICD-10-CM | POA: Diagnosis not present

## 2021-10-22 DIAGNOSIS — J189 Pneumonia, unspecified organism: Secondary | ICD-10-CM | POA: Diagnosis not present

## 2021-10-22 DIAGNOSIS — A419 Sepsis, unspecified organism: Secondary | ICD-10-CM | POA: Diagnosis not present

## 2021-10-22 DIAGNOSIS — J9601 Acute respiratory failure with hypoxia: Secondary | ICD-10-CM | POA: Diagnosis not present

## 2021-10-22 DIAGNOSIS — J14 Pneumonia due to Hemophilus influenzae: Secondary | ICD-10-CM | POA: Diagnosis not present

## 2021-10-22 DIAGNOSIS — J45991 Cough variant asthma: Secondary | ICD-10-CM | POA: Diagnosis not present

## 2021-10-22 DIAGNOSIS — H66003 Acute suppurative otitis media without spontaneous rupture of ear drum, bilateral: Secondary | ICD-10-CM

## 2021-10-22 LAB — BASIC METABOLIC PANEL
Anion gap: 8 (ref 5–15)
BUN: 12 mg/dL (ref 8–23)
CO2: 31 mmol/L (ref 22–32)
Calcium: 8.6 mg/dL — ABNORMAL LOW (ref 8.9–10.3)
Chloride: 100 mmol/L (ref 98–111)
Creatinine, Ser: 0.7 mg/dL (ref 0.61–1.24)
GFR, Estimated: >60 mL/min (ref >60)
Glucose, Bld: 109 mg/dL — ABNORMAL HIGH (ref 70–99)
Potassium: 3.6 mmol/L (ref 3.5–5.1)
Sodium: 139 mmol/L (ref 135–145)

## 2021-10-22 LAB — QUANTIFERON-TB GOLD PLUS: QuantiFERON-TB Gold Plus: NEGATIVE

## 2021-10-22 LAB — CBC
HCT: 40.4 % (ref 39.0–52.0)
Hemoglobin: 13.6 g/dL (ref 13.0–17.0)
MCH: 31.8 pg (ref 26.0–34.0)
MCHC: 33.7 g/dL (ref 30.0–36.0)
MCV: 94.4 fL (ref 80.0–100.0)
Platelets: 131 10*3/uL — ABNORMAL LOW (ref 150–400)
RBC: 4.28 MIL/uL (ref 4.22–5.81)
RDW: 13.2 % (ref 11.5–15.5)
WBC: 16.5 10*3/uL — ABNORMAL HIGH (ref 4.0–10.5)
nRBC: 0 % (ref 0.0–0.2)

## 2021-10-22 LAB — QUANTIFERON-TB GOLD PLUS (RQFGPL)
QuantiFERON Mitogen Value: 1.26 IU/mL
QuantiFERON Nil Value: 0.1 IU/mL
QuantiFERON TB1 Ag Value: 0 IU/mL
QuantiFERON TB2 Ag Value: 0 IU/mL

## 2021-10-22 LAB — ANTI-DNA ANTIBODY, DOUBLE-STRANDED: ds DNA Ab: <1 IU/mL (ref 0–9)

## 2021-10-22 LAB — GLUCOSE, CAPILLARY
Glucose-Capillary: 113 mg/dL — ABNORMAL HIGH (ref 70–99)
Glucose-Capillary: 206 mg/dL — ABNORMAL HIGH (ref 70–99)
Glucose-Capillary: 228 mg/dL — ABNORMAL HIGH (ref 70–99)
Glucose-Capillary: 243 mg/dL — ABNORMAL HIGH (ref 70–99)
Glucose-Capillary: 84 mg/dL (ref 70–99)
Glucose-Capillary: 94 mg/dL (ref 70–99)

## 2021-10-22 MED ORDER — GLUCERNA SHAKE PO LIQD
237.0000 mL | Freq: Two times a day (BID) | ORAL | Status: DC
  Administered 2021-10-22 – 2021-10-24 (×3): 237 mL via ORAL
  Filled 2021-10-22 (×7): qty 237

## 2021-10-22 MED ORDER — PREDNISONE 20 MG PO TABS
40.0000 mg | ORAL_TABLET | Freq: Every day | ORAL | Status: DC
Start: 2021-10-23 — End: 2021-10-24
  Administered 2021-10-23 – 2021-10-24 (×2): 40 mg via ORAL
  Filled 2021-10-22 (×2): qty 2

## 2021-10-22 MED ORDER — AMOXICILLIN-POT CLAVULANATE 875-125 MG PO TABS
1.0000 | ORAL_TABLET | Freq: Two times a day (BID) | ORAL | Status: AC
  Administered 2021-10-23 (×2): 1 via ORAL
  Filled 2021-10-22 (×2): qty 1

## 2021-10-22 MED ORDER — INSULIN GLARGINE-YFGN 100 UNIT/ML ~~LOC~~ SOLN
10.0000 [IU] | Freq: Every day | SUBCUTANEOUS | Status: DC
  Administered 2021-10-22 – 2021-10-24 (×3): 10 [IU] via SUBCUTANEOUS
  Filled 2021-10-22 (×3): qty 0.1

## 2021-10-22 NOTE — Progress Notes (Signed)
PROGRESS NOTE    Joseph Powell  MEQ:[id number] DOB: 1955/09/22 DOA: 10/16/2021 PCP: Faustino Congress, NP   Brief Narrative: 66 year old with past medical history significant for lymphoma diagnosed 2018, had stem cell transplant 2019, has done well since that time.  Followed by oncologist at [facility], last evaluation August 16, 2021, patient is in remission.  Patient present with worsening shortness of breath, and fevers.  He just returned from a trip from [place], 6/13. He developed shortness of breath and congestion, June first.  During these illness he received azithromycin initially for 3 days then subsequently extended to 7 days.  He presents with worsening shortness of breath, cough.    He was found to be hypoxic oxygen saturation 86 on room air, he required up to 9 L high flow oxygen. Chest x ray: Background pattern of scattered pulmonary scaring.  Superimposed acute bronchial thickening and patchy pulmonary infiltrates in the mid and lower lungs consistent with bronchopneumonia.  Patient found to have Haemophilus Influenzae  Bacteremia, para-influenza 3. Bacterial PNA. Acute hypoxic respiratory failure.  Treated with IV ceftriaxone and IV hydrocortisone. His oxygen requirement has significantly improved. He is down to 6 L oxygen today. We need to make sure his oxygen requirement of ambulation is stable prior to discharge.   ID planning on transition to oral Augmentin or amoxicillin if he is doing well to complete 7 days.   Assessment & Plan:   Principal Problem:   Sepsis due to pneumonia [facility]) Active Problems:   Pneumonia of both lungs due to infectious organism   Acute respiratory failure with hypoxia ([place])   Type 2 diabetes mellitus without complication, without long-term current use of insulin (HCC)   Cough variant asthma vs UACS   Hypokalemia   GERD without esophagitis   Gout   Malnutrition of moderate degree   Haemophilus infection   Penicillin allergy  1-Sepsis  secondary to pneumonia: Haemophilus Influenzae Bacteremia and PNA Patient presents with acute hypoxic respiratory failure, leukocytosis, tachypnea,tachycardia/ in setting of PNA, parainfluenza, concern for superimpose bacterial infection.  -Parainfluenza 3 Positive.  -Blood culture. Haemophilus Influenzae -Continue with IV ceftriaxone.  -Sputum culture: rare respiratory flora, legionella antigen Negative, strep pneumonia negative. MRSA PCR> negative -CTA negative for PE>  -ID consulted. Plan to continue with IV ceftriaxone today. Transition to Augmentin /amoxicillin if he is doing well to complete 7 days of oral antibiotics.  -WBC down 16. IV steroid contributing to leukocytosis.    Acute hypoxic respiratory failure secondary to pneumonia, : Bilateral Pneumonia; Parainfluenza and Haemophilus Influenzae PNA -Patent required  HFNC Oxygen  25 L 50 % on admission.  -CCM consulted. ANA negative, RF: 13. Cyclic Citruli negative.  -Continue with nebulizer.  -Treated with BIPAP during hospitalization. Plan to continue with BIPAP PRN per CCM -Continue with IV Ceftriaxone.  -Elevation BNP-- ECHO normal EF, Low normal RV function in setting of PNA> Received one dose of lasix.  -He will complete  200 mg IV hydrocortisone for 5 days.  -6/19:  he had  coughing spell, and oxygen decreased to 85. Oxygen Increased  to 10L. Subsequently down to 7 L.  -repeated Chest x ray 6/19: Which showed improved aeration to both lungs with residual left base and right mid lung airspace opacities.  Appreciate Pulmonologist follow up./  -Discussed with Dr Carlis Abbott page--- start Prednisone 40 mg daily for 5 days from 6/21.  -Patient needs to ambulate, we need to evaluate oxygen requirement on ambulation. His oxygen requirement at rest and on ambulation needs  to be consistently stable prior to discharge.   Diabetes type 2: SSI.  Started  Long acting insulin while on steroids.  A1c 6.6. Suspect elevated CBG in setting of  steroids.  He might need to be discharge on Low dose Semglee while he is on prednisone.   Cough variant asthma: Continue with nebulizer.   Hypokalemia: Replaced.  Suspect Otitis/Hearing loss; suspect related to viral infection/Haemophilus influenzae. . Claritin/  Hearing loss persist. Appreciate Dr Constance Holster evaluation. Follow up Out patient.   Gout: Continue with Allopurinol.   Moderate circumferential thickening of the distal esophagus by CT; Started PPI. Needs follow up when he recovers from PNA with gastroenterologist.   HTN; continue with Carvedilol. Started Norvasc. [place] Hydralazine.  BP better controlled.   Elevated D dimer: CTA negative for PE. Doppler LE; Negative for DVT.  Nutrition; poor appetite.  Started ensure. Snacks.  Nutrition consultation.  Mild thrombocytopenia; monitor.  Malar rash; for completeness Double strand DNA> ordered, negative.   Estimated body mass index is 26.08 kg/m as calculated from the following:   Height as of this encounter: '5\' 11"'$  (1.803 m).   Weight as of this encounter: 84.8 kg.   DVT prophylaxis: Lovenox Code Status: Full code Family Communication: Wife during rounds today.  Disposition Plan:  Status is: Inpatient Remains inpatient appropriate because: remain inpatient for management of Resp failure.     Consultants:  CCM  Procedures:  ECHO EF 65 %  Antimicrobials:  Cefepime, Doxy   Subjective: He is feeling well. Breathing is improving. Cough improving. Looking forward on walking.    Objective: Vitals:   10/21/21 2013 10/21/21 2322 10/22/21 0519 10/22/21 0833  BP:   (!) 138/109   Pulse:  66 65   Resp:  (!) 22 (!) 23   Temp:      TempSrc:      SpO2: 97% 95% 98% 96%  Weight:      Height:        Intake/Output Summary (Last 24 hours) at 10/22/2021 0842 Last data filed at 10/21/2021 1634 Gross per 24 hour  Intake 360 ml  Output 450 ml  Net -90 ml    Filed Weights   10/16/21 0800  Weight: 84.8 kg     Examination:  General exam: NAD Respiratory system: Normal Respiratory effort.  Central nervous system: Alert Extremities: No edema    Data Reviewed: I have personally reviewed following labs and imaging studies  CBC: Recent Labs  Lab 10/16/21 0813 10/17/21 0736 10/18/21 0735 10/19/21 0718 10/20/21 0527 10/21/21 0415 10/22/21 0446  WBC 29.5* 17.7* 13.0* 20.6* 18.3* 16.7* 16.5*  NEUTROABS 21.1* 12.2* 10.1*  --  13.7*  --   --   HGB 13.5 12.1* 13.4 13.5 13.3 13.4 13.6  HCT 39.8 37.0* 40.4 41.5 40.4 39.5 40.4  MCV 92.1 97.1 96.4 97.6 96.2 94.5 94.4  PLT 150 125* 159 160 150 144* 131*    Basic Metabolic Panel: Recent Labs  Lab 10/16/21 0855 10/17/21 0507 10/18/21 0735 10/19/21 0718 10/20/21 0527 10/21/21 0415 10/22/21 0446  NA  --  139 141 141 139 140 139  K  --  3.8 3.4* 4.0 3.5 3.4* 3.6  CL  --  103 102 103 101 99 100  CO2  --  '26 29 29 29 '$ 32 31  GLUCOSE  --  152* 229* 138* 155* 116* 109*  BUN  --  6* '10 12 15 12 12  '$ CREATININE  --  0.55* 0.66 0.70 0.66 0.64 0.70  CALCIUM  --  8.1* 8.8* 9.2 9.0 8.7* 8.6*  MG 1.7 1.9 2.2 2.2  --   --   --   PHOS 3.1  --  3.0  --   --   --   --     GFR: Estimated Creatinine Clearance: 96.7 mL/min (by C-G formula based on SCr of 0.7 mg/dL). Liver Function Tests: Recent Labs  Lab 10/16/21 0813 10/17/21 0507  AST 50* 43*  ALT 36 33  ALKPHOS 65 55  BILITOT 1.0 0.7  PROT 6.1* 5.2*  ALBUMIN 3.6 2.6*    No results for input(s): "LIPASE", "AMYLASE" in the last 168 hours. No results for input(s): "AMMONIA" in the last 168 hours. Coagulation Profile: Recent Labs  Lab 10/16/21 0813  INR 1.3*    Cardiac Enzymes: No results for input(s): "CKTOTAL", "CKMB", "CKMBINDEX", "TROPONINI" in the last 168 hours. BNP (last 3 results) No results for input(s): "PROBNP" in the last 8760 hours. HbA1C: No results for input(s): "HGBA1C" in the last 72 hours.  CBG: Recent Labs  Lab 10/21/21 2003 10/21/21 2314 10/22/21 0015  10/22/21 0600 10/22/21 0731  GLUCAP 200* 53* 84 113* 94    Lipid Profile: No results for input(s): "CHOL", "HDL", "LDLCALC", "TRIG", "CHOLHDL", "LDLDIRECT" in the last 72 hours. Thyroid Function Tests: No results for input(s): "TSH", "T4TOTAL", "FREET4", "T3FREE", "THYROIDAB" in the last 72 hours. Anemia Panel: No results for input(s): "VITAMINB12", "FOLATE", "FERRITIN", "TIBC", "IRON", "RETICCTPCT" in the last 72 hours. Sepsis Labs: Recent Labs  Lab 10/16/21 0827 10/16/21 0958 10/17/21 0507 10/18/21 0735 10/19/21 0537  PROCALCITON  --   --  <0.10 <0.10 <0.10  LATICACIDVEN 1.5 1.5  --   --   --      Recent Results (from the past 240 hour(s))  Culture, blood (Routine x 2)     Status: None   Collection Time: 10/16/21  8:13 AM   Specimen: BLOOD LEFT ARM  Result Value Ref Range Status   Specimen Description   Final    BLOOD LEFT ARM Performed at [facility], [address] [place], [place] [address]    Special Requests   Final    BOTTLES DRAWN AEROBIC AND ANAEROBIC Blood Culture adequate volume Performed at [facility], [address], [place], [place] [address]    Culture   Final    NO GROWTH 5 DAYS Performed at [facility], [place] [address] [place], [place] [address]    Report Status 10/21/2021 FINAL  Final  Culture, blood (Routine x 2)     Status: Abnormal (Preliminary result)   Collection Time: 10/16/21  8:13 AM   Specimen: BLOOD RIGHT ARM  Result Value Ref Range Status   Specimen Description   Final    BLOOD RIGHT ARM Performed at [facility], [address], [place], [place] [address]    Special Requests   Final    BOTTLES DRAWN AEROBIC AND ANAEROBIC Blood Culture adequate volume Performed at [facility], [address] [place], [place] [address]    Culture  Setup Time   Final    GRAM NEGATIVE RODS ANAEROBIC BOTTLE ONLY CRITICAL RESULT CALLED TO, READ BACK BY AND  VERIFIED WITH: PHARMD ELLEN JACKSON 10/18/21'@2'$ :30 BY TW    Culture (A)  Final    HAEMOPHILUS INFLUENZAE BETA LACTAMASE NEGATIVE HEALTH DEPARTMENT NOTIFIED Referred to [facility] in [place], [place] for serotyping. Performed at [facility], [place] [address], [place], [place] [address]    Report Status PENDING  Incomplete  Blood Culture ID Panel (Reflexed)     Status: Abnormal   Collection Time: 10/16/21  8:13 AM  Result Value Ref Range Status   Enterococcus faecalis NOT DETECTED NOT DETECTED Final   Enterococcus Faecium NOT DETECTED NOT DETECTED Final   Listeria monocytogenes NOT DETECTED NOT DETECTED Final   Staphylococcus species NOT DETECTED NOT DETECTED Final   Staphylococcus aureus (BCID) NOT DETECTED NOT DETECTED Final   Staphylococcus epidermidis NOT DETECTED NOT DETECTED Final   Staphylococcus lugdunensis NOT DETECTED NOT DETECTED Final   Streptococcus species NOT DETECTED NOT DETECTED Final   Streptococcus agalactiae NOT DETECTED NOT DETECTED Final   Streptococcus pneumoniae NOT DETECTED NOT DETECTED Final   Streptococcus pyogenes NOT DETECTED NOT DETECTED Final   A.calcoaceticus-baumannii NOT DETECTED NOT DETECTED Final   Bacteroides fragilis NOT DETECTED NOT DETECTED Final   Enterobacterales NOT DETECTED NOT DETECTED Final   Enterobacter cloacae complex NOT DETECTED NOT DETECTED Final   Escherichia coli NOT DETECTED NOT DETECTED Final   Klebsiella aerogenes NOT DETECTED NOT DETECTED Final   Klebsiella oxytoca NOT DETECTED NOT DETECTED Final   Klebsiella pneumoniae NOT DETECTED NOT DETECTED Final   Proteus species NOT DETECTED NOT DETECTED Final   Salmonella species NOT DETECTED NOT DETECTED Final   Serratia marcescens NOT DETECTED NOT DETECTED Final   Haemophilus influenzae DETECTED (A) NOT DETECTED Final    Comment: CRITICAL RESULT CALLED TO, READ BACK BY AND VERIFIED WITH: PHARMD ELLEN JACKSON 10/18/21'@2'$ :30 BY TW    Neisseria meningitidis NOT DETECTED NOT  DETECTED Final   Pseudomonas aeruginosa NOT DETECTED NOT DETECTED Final   Stenotrophomonas maltophilia NOT DETECTED NOT DETECTED Final   Candida albicans NOT DETECTED NOT DETECTED Final   Candida auris NOT DETECTED NOT DETECTED Final   Candida glabrata NOT DETECTED NOT DETECTED Final   Candida krusei NOT DETECTED NOT DETECTED Final   Candida parapsilosis NOT DETECTED NOT DETECTED Final   Candida tropicalis NOT DETECTED NOT DETECTED Final   Cryptococcus neoformans/gattii NOT DETECTED NOT DETECTED Final    Comment: Performed at [facility], [address], [place], [place] [address]  Resp Panel by RT-PCR (Flu A&B, Covid) Anterior Nasal Swab     Status: None   Collection Time: 10/16/21  8:55 AM   Specimen: Anterior Nasal Swab  Result Value Ref Range Status   SARS Coronavirus 2 by RT PCR NEGATIVE NEGATIVE Final    Comment: (NOTE) SARS-CoV-2 target nucleic acids are NOT DETECTED.  The SARS-CoV-2 RNA is generally detectable in upper respiratory specimens during the acute phase of infection. The lowest concentration of SARS-CoV-2 viral copies this assay can detect is 138 copies/mL. A negative result does not preclude SARS-Cov-2 infection and should not be used as the sole basis for treatment or other patient management decisions. A negative result may occur with  improper specimen collection/handling, submission of specimen other than nasopharyngeal swab, presence of viral mutation(s) within the areas targeted by this assay, and inadequate number of viral copies(<138 copies/mL). A negative result must be combined with clinical observations, patient history, and epidemiological information. The expected result is Negative.  Fact Sheet for Patients:  EntrepreneurPulse.com.au  Fact Sheet for Healthcare Providers:  IncredibleEmployment.be  This test is no t yet approved or cleared by the [place] FDA and  has been authorized for detection  and/or diagnosis of SARS-CoV-2 by FDA under an Emergency Use Authorization (EUA). This EUA will remain  in effect (meaning this test can be used) for the duration of the  COVID-19 declaration under Section 564(b)(1) of the Act, 21 U.S.C.section 360bbb-3(b)(1), unless the authorization is terminated  or revoked sooner.       Influenza A by PCR NEGATIVE NEGATIVE Final   Influenza B by PCR NEGATIVE NEGATIVE Final    Comment: (NOTE) The Xpert Xpress SARS-CoV-2/FLU/RSV plus assay is intended as an aid in the diagnosis of influenza from Nasopharyngeal swab specimens and should not be used as a sole basis for treatment. Nasal washings and aspirates are unacceptable for Xpert Xpress SARS-CoV-2/FLU/RSV testing.  Fact Sheet for Patients: EntrepreneurPulse.com.au  Fact Sheet for Healthcare Providers: IncredibleEmployment.be  This test is not yet approved or cleared by the [place] FDA and has been authorized for detection and/or diagnosis of SARS-CoV-2 by FDA under an Emergency Use Authorization (EUA). This EUA will remain in effect (meaning this test can be used) for the duration of the COVID-19 declaration under Section 564(b)(1) of the Act, 21 U.S.C. section 360bbb-3(b)(1), unless the authorization is terminated or revoked.  Performed at [facility], [address], [place], [place] [address]   Respiratory (~20 pathogens) panel by PCR     Status: Abnormal   Collection Time: 10/16/21 10:50 AM   Specimen: Nasopharyngeal Swab; Respiratory  Result Value Ref Range Status   Adenovirus NOT DETECTED NOT DETECTED Final   Coronavirus 229E NOT DETECTED NOT DETECTED Final    Comment: (NOTE) The Coronavirus on the Respiratory Panel, DOES NOT test for the novel  Coronavirus (2019 nCoV)    Coronavirus HKU1 NOT DETECTED NOT DETECTED Final   Coronavirus NL63 NOT DETECTED NOT DETECTED Final   Coronavirus OC43 NOT DETECTED NOT DETECTED  Final   Metapneumovirus NOT DETECTED NOT DETECTED Final   Rhinovirus / Enterovirus NOT DETECTED NOT DETECTED Final   Influenza A NOT DETECTED NOT DETECTED Final   Influenza B NOT DETECTED NOT DETECTED Final   Parainfluenza Virus 1 NOT DETECTED NOT DETECTED Final   Parainfluenza Virus 2 NOT DETECTED NOT DETECTED Final   Parainfluenza Virus 3 DETECTED (A) NOT DETECTED Final   Parainfluenza Virus 4 NOT DETECTED NOT DETECTED Final   Respiratory Syncytial Virus NOT DETECTED NOT DETECTED Final   Bordetella pertussis NOT DETECTED NOT DETECTED Final   Bordetella Parapertussis NOT DETECTED NOT DETECTED Final   Chlamydophila pneumoniae NOT DETECTED NOT DETECTED Final   Mycoplasma pneumoniae NOT DETECTED NOT DETECTED Final    Comment: Performed at [facility], [address], [place], [place] [address]  Urine Culture     Status: None   Collection Time: 10/16/21 11:20 AM   Specimen: In/Out Cath Urine  Result Value Ref Range Status   Specimen Description   Final    IN/OUT CATH URINE Performed at [facility], [address] [place], [place] [address]    Special Requests   Final    NONE Performed at [facility], [address] [place], [place] [address]    Culture   Final    NO GROWTH Performed at [facility], [place] [address], [place], [place] [address]    Report Status 10/17/2021 FINAL  Final  Expectorated Sputum Assessment w Gram Stain, Rflx to Resp Cult     Status: None   Collection Time: 10/17/21  4:31 AM   Specimen: Sputum  Result Value Ref Range Status   Specimen Description SPUTUM  Final   Special Requests NONE  Final   Sputum evaluation   Final    THIS SPECIMEN IS ACCEPTABLE FOR SPUTUM CULTURE Performed  at [facility], [place] [address] [place], [place] [address]    Report Status 10/17/2021 FINAL  Final  Culture, Respiratory w Gram Stain     Status: None   Collection Time: 10/17/21  4:31 AM   Specimen:  SPU  Result Value Ref Range Status   Specimen Description   Final    SPUTUM Performed at [place] [address], [place], [place] [address]    Special Requests   Final    NONE Reflexed from [id number] Performed at [facility], [place] [address] [place], [place] [address]    Gram Stain   Final    ABUNDANT WBC PRESENT, PREDOMINANTLY PMN FEW GRAM POSITIVE COCCI FEW GRAM NEGATIVE RODS FEW GRAM POSITIVE RODS    Culture   Final    RARE Normal respiratory flora-no Staph aureus or Pseudomonas seen Performed at [facility], [address], [place], [place] [address]    Report Status 10/19/2021 FINAL  Final  MRSA Next Gen by PCR, Nasal     Status: None   Collection Time: 10/17/21  5:35 PM   Specimen: Nasal Mucosa; Nasal Swab  Result Value Ref Range Status   MRSA by PCR Next Gen NOT DETECTED NOT DETECTED Final    Comment: (NOTE) The GeneXpert MRSA Assay (FDA approved for NASAL specimens only), is one component of a comprehensive MRSA colonization surveillance program. It is not intended to diagnose MRSA infection nor to guide or monitor treatment for MRSA infections. Test performance is not FDA approved in patients less than 77 years old. Performed at [facility], [place] [address] [place],  [address]          Radiology Studies: DG Chest 2 View  Result Date: 10/21/2021 CLINICAL DATA:  Shortness of breath.  Pneumonia and sepsis. EXAM: CHEST - 2 VIEW COMPARISON:  10/17/2021 FINDINGS: Normal heart size and mediastinal contours. No signs of pleural effusion. Bilateral areas of subsegmental atelectasis noted. Interval improvement in bilateral lower lung zone predominant reticular and nodular pulmonary opacities. Residual opacities are noted within the retrocardiac left lower lobe and right middle lobe. IMPRESSION: Improved aeration to both lungs with residual left base and right midlung airspace opacities.  Electronically Signed   By: Kerby Moors M.D.   On: 10/21/2021 14:51        Scheduled Meds:  allopurinol  300 mg Oral Daily   amLODipine  5 mg Oral Daily   carvedilol  3.125 mg Oral BID WC   enoxaparin (LOVENOX) injection  40 mg Subcutaneous Q24H   feeding supplement  237 mL Oral TID BM   fluticasone  1 spray Each Nare Daily   HYDROcodone bit-homatropine  5 mL Oral BID   hydrocortisone sod succinate (SOLU-CORTEF) inj  100 mg Intravenous Q12H   insulin aspart  0-15 Units Subcutaneous Q4H   insulin glargine-yfgn  10 Units Subcutaneous Daily   ipratropium-albuterol  3 mL Nebulization BID   loratadine  10 mg Oral Daily   oxymetazoline  1 spray Each Nare BID   pantoprazole  40 mg Oral Daily   sodium chloride flush  3 mL Intravenous Q12H   Continuous Infusions:  cefTRIAXone (ROCEPHIN)  IV 2 g (10/22/21 0518)     LOS: 6 days    Time spent: 35 minutes.     Elmarie Shiley, MD [facility]   If 7PM-7AM, please contact night-coverage [email or web address]  10/22/2021, 8:42 AM

## 2021-10-22 NOTE — Telephone Encounter (Signed)
Dr. Melvyn Novas please advise if switch is okay. Patient is scheduled for HFU on 10/31/2021 at 11am with Derl Barrow (TP is on vacation).

## 2021-10-22 NOTE — Progress Notes (Signed)
Physical Therapy Treatment Patient Details Name: Joseph Powell MRN: [id number] DOB: 1955-07-13 Today's Date: 10/22/2021   History of Present Illness 66 year old with past medical history significant for lymphoma diagnosed 2018, had stem cell transplant 2019, has done well since that time.  Followed by oncologist at [facility], last evaluation August 16, 2021, patient is in remission.  Patient present with worsening shortness of breath, and fevers.  He just returned from a trip from [place], 6/13. He developed shortness of breath and congestion, June first.  During these illness he received azithromycin initially for 3 days then subsequently extended to 7 days.  He presents with worsening shortness of breath, cough.       He was found to be hypoxic oxygen saturation 86 on room air, he required up to 9 L high flow oxygen. Chest x ray: Background pattern of scattered pulmonary scaring.  Superimposed acute bronchial thickening and patchy pulmonary infiltrates in the mid and lower lungs consistent with bronchopneumonia.     Patient found to have Haemophilus Influenzae  Bacteremia, para-influenza 3. Bacterial PNA.    PT Comments    Pt OOB in recliner with Spouse at bed side. Assisted with amb in hallway while monitoring vitals.  General transfer comment: used a walker only for safety (instructed pt) multiple lines/equipment.  25% VC's on proper hand placement and safety with turns using walker. General Gait Details: used RW only for safety (first time amb) and + 2 assist for equipment and recliner to follow.  Pt remained on 6 lts sats avg 99% and HR 83.  Did not attempt to wean this session.  Mild post activity cough.  2/4 dyspnea.  Pt continue to improve.  Pt plans to D/C back home and LPT is rec OP PT  Recommendations for follow up therapy are one component of a multi-disciplinary discharge planning process, led by the attending physician.  Recommendations may be updated based on patient status, additional  functional criteria and insurance authorization.  Follow Up Recommendations  Outpatient PT     Assistance Recommended at Discharge PRN  Patient can return home with the following Help with stairs or ramp for entrance;Assistance with cooking/housework   Equipment Recommendations  None recommended by PT    Recommendations for Other Services       Precautions / Restrictions Precautions Precautions: Other (comment) (Droplet) Precaution Comments: monitor sats Restrictions Weight Bearing Restrictions: No     Mobility  Bed Mobility               General bed mobility comments: OOB in recliner    Transfers Overall transfer level: Needs assistance Equipment used: Rolling walker (2 wheels) Transfers: Sit to/from Stand, Bed to chair/wheelchair/BSC Sit to Stand: Supervision, Min guard   Step pivot transfers: Min guard       General transfer comment: used a walker only for safety (instructed pt) multiple lines/equipment.  25% VC's on proper hand placement and safety with turns using walker.    Ambulation/Gait Ambulation/Gait assistance: Min guard, Supervision Gait Distance (Feet): 155 Feet Assistive device: Rolling walker (2 wheels) Gait Pattern/deviations: Step-through pattern, Decreased stride length       General Gait Details: used RW only for safety (first time amb) and + 2 assist for equipment and recliner to follow.  Pt remained on 6 lts sats avg 99% and HR 83.  Did not attempt to wean this session.  Mild post activity cough.  2/4 dyspnea.  Pt continue to improve.   Stairs  Wheelchair Mobility    Modified Rankin (Stroke Patients Only)       Balance                                            Cognition Arousal/Alertness: Awake/alert   Overall Cognitive Status: Within Functional Limits for tasks assessed                                 General Comments: AxO x 3 very pleasant and motivated         Exercises      General Comments        Pertinent Vitals/Pain Pain Assessment Pain Assessment: No/denies pain    Home Living                          Prior Function            PT Goals (current goals can now be found in the care plan section) Progress towards PT goals: Progressing toward goals    Frequency    Min 3X/week      PT Plan Current plan remains appropriate    Co-evaluation              AM-PAC PT "6 Clicks" Mobility   Outcome Measure  Help needed turning from your back to your side while in a flat bed without using bedrails?: A Little Help needed moving from lying on your back to sitting on the side of a flat bed without using bedrails?: A Little Help needed moving to and from a bed to a chair (including a wheelchair)?: A Little Help needed standing up from a chair using your arms (e.g., wheelchair or bedside chair)?: A Little Help needed to walk in hospital room?: A Little Help needed climbing 3-5 steps with a railing? : A Lot 6 Click Score: 17    End of Session Equipment Utilized During Treatment: Oxygen Activity Tolerance: Patient tolerated treatment well Patient left: in chair;with call bell/phone within reach;with chair alarm set;with family/visitor present Nurse Communication: Mobility status PT Visit Diagnosis: Other abnormalities of gait and mobility (R26.89)     Time: 1610-9604 PT Time Calculation (min) (ACUTE ONLY): 26 min  Charges:  $Gait Training: 8-22 mins $Therapeutic Activity: 8-22 mins                     Rica Koyanagi  PTA [facility] M-F          [phone] Weekend pager [phone]

## 2021-10-22 NOTE — Progress Notes (Addendum)
Pt seen and given his scheduled nebulizer treatment which he tolerated well.  HR67, rr20, spo2 96% on 5L hfnc/salter.  No increased wob/respiratory distress noted or voiced by pt at this time.  Bipap remains in room on standby but not indicated at this time.

## 2021-10-22 NOTE — Telephone Encounter (Signed)
Fine with me

## 2021-10-22 NOTE — Inpatient Diabetes Management (Signed)
Inpatient Diabetes Program Recommendations  AACE/ADA: New Consensus Statement on Inpatient Glycemic Control (2015)  Target Ranges:  Prepandial:   less than 140 mg/dL      Peak postprandial:   less than 180 mg/dL (1-2 hours)      Critically ill patients:  140 - 180 mg/dL    Latest Reference Range & Units 03/05/21 08:35 10/17/21 05:07  Hemoglobin A1C 4.8 - 5.6 % 6.0 6.6 (H)  (142 mg/dl)  (H): Data is abnormally high  Latest Reference Range & Units 10/20/21 23:22 10/21/21 04:14 10/21/21 07:29 10/21/21 11:11 10/21/21 16:07 10/21/21 20:03  Glucose-Capillary 70 - 99 mg/dL 103 (H) 101 (H) 94  10 units Semglee '@0926'$  114 (H) 322 (H)  11 units Novolog  200 (H)  3 units Novolog   (H): Data is abnormally high  Latest Reference Range & Units 10/21/21 23:14 10/22/21 00:15 10/22/21 06:00  Glucose-Capillary 70 - 99 mg/dL 53 (L) 84 113 (H)  (L): Data is abnormally low (H): Data is abnormally high   Home DM Meds: None Listed  Current Orders: Semglee 15 units daily Novolog 0-15 units Q4H Solucortef 100 mg BID    MD- Note Semglee reduced to 10 units Daily yesterday AM and then increased back to 15 units Daily this AM.  CBG 53 at 11pm last night.  CBGs were elevated last PM at 4pm and 8pm  Please consider:  1. Reduce Semglee back to 10 units Daily  2. Continue Novolog SSI at Q4 hour coverage give poor PO intake    --Will follow patient during hospitalization--  Wyn Quaker RN, MSN, CDE Diabetes Coordinator Inpatient Glycemic Control Team Team Pager: [phone] (8a-5p)

## 2021-10-22 NOTE — Progress Notes (Signed)
Report received from [place], RN at 1500.  Assessment unchanged from a.m. assessment. Andre Lefort

## 2021-10-22 NOTE — Progress Notes (Signed)
Pt taken off of BiPAP and placed back on 6Lpm Salter HFNC per Pt request.

## 2021-10-22 NOTE — Progress Notes (Signed)
Chief complaint:  Hearing is improved   Antibiotics:  Anti-infectives (From admission, onward)    Start     Dose/Rate Route Frequency Ordered Stop   10/23/21 1000  amoxicillin-clavulanate (AUGMENTIN) 875-125 MG per tablet 1 tablet        1 tablet Oral Every 12 hours 10/22/21 1321 10/24/21 0959   10/21/21 1100  amoxicillin (AMOXIL) capsule 500 mg        500 mg Oral  Once 10/21/21 1005 10/21/21 1203   10/18/21 0445  cefTRIAXone (ROCEPHIN) 2 g in sodium chloride 0.9 % 100 mL IVPB  Status:  Discontinued        2 g 200 mL/hr over 30 Minutes Intravenous Every 24 hours 10/18/21 0345 10/22/21 1321   10/17/21 1000  levofloxacin (LEVAQUIN) IVPB 750 mg  Status:  Discontinued        750 mg 100 mL/hr over 90 Minutes Intravenous  Once 10/16/21 1036 10/17/21 0435   10/17/21 1000  doxycycline (VIBRAMYCIN) 100 mg in sodium chloride 0.9 % 250 mL IVPB  Status:  Discontinued        100 mg 125 mL/hr over 120 Minutes Intravenous Every 12 hours 10/17/21 0439 10/18/21 0921   10/17/21 1000  ceFEPIme (MAXIPIME) 2 g in sodium chloride 0.9 % 100 mL IVPB  Status:  Discontinued        2 g 200 mL/hr over 30 Minutes Intravenous Every 8 hours 10/17/21 0823 10/18/21 0345   10/17/21 0930  cefTRIAXone (ROCEPHIN) 2 g in sodium chloride 0.9 % 100 mL IVPB  Status:  Discontinued        2 g 200 mL/hr over 30 Minutes Intravenous Every 24 hours 10/17/21 0439 10/17/21 0812   10/16/21 1030  levofloxacin (LEVAQUIN) IVPB 750 mg  Status:  Discontinued        750 mg 100 mL/hr over 90 Minutes Intravenous  Once 10/16/21 1020 10/16/21 1036   10/16/21 0930  azithromycin (ZITHROMAX) 500 mg in sodium chloride 0.9 % 250 mL IVPB        500 mg 250 mL/hr over 60 Minutes Intravenous  Once 10/16/21 0917 10/16/21 1104   10/16/21 0930  cefTRIAXone (ROCEPHIN) 1 g in sodium chloride 0.9 % 100 mL IVPB        1 g 200 mL/hr over 30 Minutes Intravenous  Once 10/16/21 0917 10/16/21 0952   10/16/21 0900  levofloxacin (LEVAQUIN) IVPB 750 mg   Status:  Discontinued        750 mg 100 mL/hr over 90 Minutes Intravenous  Once 10/16/21 0853 10/16/21 0916       Medications: Scheduled Meds:  allopurinol  300 mg Oral Daily   amLODipine  5 mg Oral Daily   [START ON 10/23/2021] amoxicillin-clavulanate  1 tablet Oral Q12H   carvedilol  3.125 mg Oral BID WC   enoxaparin (LOVENOX) injection  40 mg Subcutaneous Q24H   feeding supplement (GLUCERNA SHAKE)  237 mL Oral BID BM   fluticasone  1 spray Each Nare Daily   HYDROcodone bit-homatropine  5 mL Oral BID   hydrocortisone sod succinate (SOLU-CORTEF) inj  100 mg Intravenous Q12H   insulin aspart  0-15 Units Subcutaneous Q4H   insulin glargine-yfgn  10 Units Subcutaneous Daily   ipratropium-albuterol  3 mL Nebulization BID   loratadine  10 mg Oral Daily   pantoprazole  40 mg Oral Daily   [START ON 10/23/2021] predniSONE  40 mg Oral Q breakfast   sodium chloride flush  3 mL Intravenous  Q12H   Continuous Infusions:   PRN Meds:.acetaminophen **OR** acetaminophen, albuterol, diphenhydrAMINE, EPINEPHrine, hydrALAZINE, HYDROcodone bit-homatropine, lip balm, ondansetron **OR** ondansetron (ZOFRAN) IV, polyethylene glycol    Objective: Weight change:   Intake/Output Summary (Last 24 hours) at 10/22/2021 1600 Last data filed at 10/22/2021 1309 Gross per 24 hour  Intake 480 ml  Output 750 ml  Net -270 ml    Blood pressure 116/72, pulse 75, temperature 97.6 F (36.4 C), temperature source Oral, resp. rate (!) 21, height '5\' 11"'$  (1.803 m), weight 84.8 kg, SpO2 99 %. Temp:  [97.6 F (36.4 C)-98 F (36.7 C)] 97.6 F (36.4 C) (06/20 1306) Pulse Rate:  [65-75] 75 (06/20 1306) Resp:  [18-23] 21 (06/20 1306) BP: (116-138)/(72-109) 116/72 (06/20 1306) SpO2:  [95 %-99 %] 99 % (06/20 1306) FiO2 (%):  [40 %-95 %] 95 % (06/20 4742)  Physical Exam: Physical Exam Constitutional:      Appearance: He is well-developed.  HENT:     Head: Normocephalic and atraumatic.  Eyes:      Conjunctiva/sclera: Conjunctivae normal.  Cardiovascular:     Rate and Rhythm: Normal rate and regular rhythm.  Pulmonary:     Effort: Pulmonary effort is normal. No respiratory distress.     Breath sounds: No wheezing.  Abdominal:     General: There is no distension.     Palpations: Abdomen is soft.  Musculoskeletal:        General: Normal range of motion.     Cervical back: Normal range of motion and neck supple.  Skin:    General: Skin is warm and dry.     Findings: No erythema or rash.  Neurological:     General: No focal deficit present.     Mental Status: He is alert and oriented to person, place, and time.  Psychiatric:        Mood and Affect: Mood normal.        Behavior: Behavior normal.        Thought Content: Thought content normal.        Judgment: Judgment normal.      CBC:    BMET Recent Labs    10/21/21 0415 10/22/21 0446  NA 140 139  K 3.4* 3.6  CL 99 100  CO2 32 31  GLUCOSE 116* 109*  BUN 12 12  CREATININE 0.64 0.70  CALCIUM 8.7* 8.6*      Liver Panel  No results for input(s): "PROT", "ALBUMIN", "AST", "ALT", "ALKPHOS", "BILITOT", "BILIDIR", "IBILI" in the last 72 hours.     Sedimentation Rate No results for input(s): "ESRSEDRATE" in the last 72 hours. C-Reactive Protein No results for input(s): "CRP" in the last 72 hours.  Micro Results: Recent Results (from the past 720 hour(s))  Culture, blood (Routine x 2)     Status: None   Collection Time: 10/16/21  8:13 AM   Specimen: BLOOD LEFT ARM  Result Value Ref Range Status   Specimen Description   Final    BLOOD LEFT ARM Performed at [facility], [address] [place], [place] [address]    Special Requests   Final    BOTTLES DRAWN AEROBIC AND ANAEROBIC Blood Culture adequate volume Performed at [facility], [address], [place], [place] [address]    Culture   Final    NO GROWTH 5 DAYS Performed at [facility]  [address] [place], [place] [address]    Report Status 10/21/2021 FINAL  Final  Culture, blood (  Routine x 2)     Status: Abnormal (Preliminary result)   Collection Time: 10/16/21  8:13 AM   Specimen: BLOOD RIGHT ARM  Result Value Ref Range Status   Specimen Description   Final    BLOOD RIGHT ARM Performed at [facility], [address] [place], [place] [address]    Special Requests   Final    BOTTLES DRAWN AEROBIC AND ANAEROBIC Blood Culture adequate volume Performed at [facility], [address], [place], [place] [address]    Culture  Setup Time   Final    GRAM NEGATIVE RODS ANAEROBIC BOTTLE ONLY CRITICAL RESULT CALLED TO, READ BACK BY AND VERIFIED WITH: PHARMD ELLEN JACKSON 10/18/21'@2'$ :30 BY TW    Culture (A)  Final    HAEMOPHILUS INFLUENZAE BETA LACTAMASE NEGATIVE HEALTH DEPARTMENT NOTIFIED Referred to [facility] in [place], [place] for serotyping. Performed at [facility], [place] [address], [place], [place] [address]    Report Status PENDING  Incomplete  Blood Culture ID Panel (Reflexed)     Status: Abnormal   Collection Time: 10/16/21  8:13 AM  Result Value Ref Range Status   Enterococcus faecalis NOT DETECTED NOT DETECTED Final   Enterococcus Faecium NOT DETECTED NOT DETECTED Final   Listeria monocytogenes NOT DETECTED NOT DETECTED Final   Staphylococcus species NOT DETECTED NOT DETECTED Final   Staphylococcus aureus (BCID) NOT DETECTED NOT DETECTED Final   Staphylococcus epidermidis NOT DETECTED NOT DETECTED Final   Staphylococcus lugdunensis NOT DETECTED NOT DETECTED Final   Streptococcus species NOT DETECTED NOT DETECTED Final   Streptococcus agalactiae NOT DETECTED NOT DETECTED Final   Streptococcus pneumoniae NOT DETECTED NOT DETECTED Final   Streptococcus pyogenes NOT DETECTED NOT DETECTED Final   A.calcoaceticus-baumannii NOT DETECTED NOT DETECTED Final   Bacteroides fragilis NOT DETECTED NOT DETECTED Final    Enterobacterales NOT DETECTED NOT DETECTED Final   Enterobacter cloacae complex NOT DETECTED NOT DETECTED Final   Escherichia coli NOT DETECTED NOT DETECTED Final   Klebsiella aerogenes NOT DETECTED NOT DETECTED Final   Klebsiella oxytoca NOT DETECTED NOT DETECTED Final   Klebsiella pneumoniae NOT DETECTED NOT DETECTED Final   Proteus species NOT DETECTED NOT DETECTED Final   Salmonella species NOT DETECTED NOT DETECTED Final   Serratia marcescens NOT DETECTED NOT DETECTED Final   Haemophilus influenzae DETECTED (A) NOT DETECTED Final    Comment: CRITICAL RESULT CALLED TO, READ BACK BY AND VERIFIED WITH: PHARMD ELLEN JACKSON 10/18/21'@2'$ :30 BY TW    Neisseria meningitidis NOT DETECTED NOT DETECTED Final   Pseudomonas aeruginosa NOT DETECTED NOT DETECTED Final   Stenotrophomonas maltophilia NOT DETECTED NOT DETECTED Final   Candida albicans NOT DETECTED NOT DETECTED Final   Candida auris NOT DETECTED NOT DETECTED Final   Candida glabrata NOT DETECTED NOT DETECTED Final   Candida krusei NOT DETECTED NOT DETECTED Final   Candida parapsilosis NOT DETECTED NOT DETECTED Final   Candida tropicalis NOT DETECTED NOT DETECTED Final   Cryptococcus neoformans/gattii NOT DETECTED NOT DETECTED Final    Comment: Performed at [facility], [address], [place], [place] [address]  Resp Panel by RT-PCR (Flu A&B, Covid) Anterior Nasal Swab     Status: None   Collection Time: 10/16/21  8:55 AM   Specimen: Anterior Nasal Swab  Result Value Ref Range Status   SARS Coronavirus 2 by RT PCR NEGATIVE NEGATIVE Final    Comment: (NOTE) SARS-CoV-2 target nucleic acids are NOT DETECTED.  The SARS-CoV-2 RNA is generally  detectable in upper respiratory specimens during the acute phase of infection. The lowest concentration of SARS-CoV-2 viral copies this assay can detect is 138 copies/mL. A negative result does not preclude SARS-Cov-2 infection and should not be used as the sole basis for treatment  or other patient management decisions. A negative result may occur with  improper specimen collection/handling, submission of specimen other than nasopharyngeal swab, presence of viral mutation(s) within the areas targeted by this assay, and inadequate number of viral copies(<138 copies/mL). A negative result must be combined with clinical observations, patient history, and epidemiological information. The expected result is Negative.  Fact Sheet for Patients:  EntrepreneurPulse.com.au  Fact Sheet for Healthcare Providers:  IncredibleEmployment.be  This test is no t yet approved or cleared by the [place] FDA and  has been authorized for detection and/or diagnosis of SARS-CoV-2 by FDA under an Emergency Use Authorization (EUA). This EUA will remain  in effect (meaning this test can be used) for the duration of the COVID-19 declaration under Section 564(b)(1) of the Act, 21 U.S.C.section 360bbb-3(b)(1), unless the authorization is terminated  or revoked sooner.       Influenza A by PCR NEGATIVE NEGATIVE Final   Influenza B by PCR NEGATIVE NEGATIVE Final    Comment: (NOTE) The Xpert Xpress SARS-CoV-2/FLU/RSV plus assay is intended as an aid in the diagnosis of influenza from Nasopharyngeal swab specimens and should not be used as a sole basis for treatment. Nasal washings and aspirates are unacceptable for Xpert Xpress SARS-CoV-2/FLU/RSV testing.  Fact Sheet for Patients: EntrepreneurPulse.com.au  Fact Sheet for Healthcare Providers: IncredibleEmployment.be  This test is not yet approved or cleared by the [place] FDA and has been authorized for detection and/or diagnosis of SARS-CoV-2 by FDA under an Emergency Use Authorization (EUA). This EUA will remain in effect (meaning this test can be used) for the duration of the COVID-19 declaration under Section 564(b)(1) of the Act, 21 U.S.C. section  360bbb-3(b)(1), unless the authorization is terminated or revoked.  Performed at [facility], [address] [place], [place] [address]   Respiratory (~20 pathogens) panel by PCR     Status: Abnormal   Collection Time: 10/16/21 10:50 AM   Specimen: Nasopharyngeal Swab; Respiratory  Result Value Ref Range Status   Adenovirus NOT DETECTED NOT DETECTED Final   Coronavirus 229E NOT DETECTED NOT DETECTED Final    Comment: (NOTE) The Coronavirus on the Respiratory Panel, DOES NOT test for the novel  Coronavirus (2019 nCoV)    Coronavirus HKU1 NOT DETECTED NOT DETECTED Final   Coronavirus NL63 NOT DETECTED NOT DETECTED Final   Coronavirus OC43 NOT DETECTED NOT DETECTED Final   Metapneumovirus NOT DETECTED NOT DETECTED Final   Rhinovirus / Enterovirus NOT DETECTED NOT DETECTED Final   Influenza A NOT DETECTED NOT DETECTED Final   Influenza B NOT DETECTED NOT DETECTED Final   Parainfluenza Virus 1 NOT DETECTED NOT DETECTED Final   Parainfluenza Virus 2 NOT DETECTED NOT DETECTED Final   Parainfluenza Virus 3 DETECTED (A) NOT DETECTED Final   Parainfluenza Virus 4 NOT DETECTED NOT DETECTED Final   Respiratory Syncytial Virus NOT DETECTED NOT DETECTED Final   Bordetella pertussis NOT DETECTED NOT DETECTED Final   Bordetella Parapertussis NOT DETECTED NOT DETECTED Final   Chlamydophila pneumoniae NOT DETECTED NOT DETECTED Final   Mycoplasma pneumoniae NOT DETECTED NOT DETECTED Final    Comment: Performed at [facility], [address], [place], [place] [address]  Urine Culture  Status: None   Collection Time: 10/16/21 11:20 AM   Specimen: In/Out Cath Urine  Result Value Ref Range Status   Specimen Description   Final    IN/OUT CATH URINE Performed at [facility], [address], [place], [place] [address]    Special Requests   Final    NONE Performed at [facility], [address] [place], [place] [address]     Culture   Final    NO GROWTH Performed at [facility], [place] [address] [place], [place] [address]    Report Status 10/17/2021 FINAL  Final  Expectorated Sputum Assessment w Gram Stain, Rflx to Resp Cult     Status: None   Collection Time: 10/17/21  4:31 AM   Specimen: Sputum  Result Value Ref Range Status   Specimen Description SPUTUM  Final   Special Requests NONE  Final   Sputum evaluation   Final    THIS SPECIMEN IS ACCEPTABLE FOR SPUTUM CULTURE Performed at [facility], [place] [address] [place], [place] [address]    Report Status 10/17/2021 FINAL  Final  Culture, Respiratory w Gram Stain     Status: None   Collection Time: 10/17/21  4:31 AM   Specimen: SPU  Result Value Ref Range Status   Specimen Description   Final    SPUTUM Performed at [facility] [address] [place], [place] [address]    Special Requests   Final    NONE Reflexed from [id number] Performed at [facility], [place] [address] [place], [place] [address]    Gram Stain   Final    ABUNDANT WBC PRESENT, PREDOMINANTLY PMN FEW GRAM POSITIVE COCCI FEW GRAM NEGATIVE RODS FEW GRAM POSITIVE RODS    Culture   Final    RARE Normal respiratory flora-no Staph aureus or Pseudomonas seen Performed at [facility], [address], [place], [place] [address]    Report Status 10/19/2021 FINAL  Final  MRSA Next Gen by PCR, Nasal     Status: None   Collection Time: 10/17/21  5:35 PM   Specimen: Nasal Mucosa; Nasal Swab  Result Value Ref Range Status   MRSA by PCR Next Gen NOT DETECTED NOT DETECTED Final    Comment: (NOTE) The GeneXpert MRSA Assay (FDA approved for NASAL specimens only), is one component of a comprehensive MRSA colonization surveillance program. It is not intended to diagnose MRSA infection nor to guide or monitor treatment for MRSA infections. Test performance is not FDA approved in patients less than 21 years old. Performed at  [facility], [place] [address], [place], [place] [address]     Studies/Results: DG Chest 2 View  Result Date: 10/21/2021 CLINICAL DATA:  Shortness of breath.  Pneumonia and sepsis. EXAM: CHEST - 2 VIEW COMPARISON:  10/17/2021 FINDINGS: Normal heart size and mediastinal contours. No signs of pleural effusion. Bilateral areas of subsegmental atelectasis noted. Interval improvement in bilateral lower lung zone predominant reticular and nodular pulmonary opacities. Residual opacities are noted within the retrocardiac left lower lobe and right middle lobe. IMPRESSION: Improved aeration to both lungs with residual left base and right midlung airspace opacities. Electronically Signed   By: Kerby Moors M.D.   On: 10/21/2021 14:51      Assessment/Plan:  INTERVAL HISTORY: Patient tolerated amoxicillin without problems   Principal Problem:   Sepsis due to pneumonia [facility]) Active Problems:   GERD without esophagitis   Gout  Cough variant asthma vs UACS   Type 2 diabetes mellitus without complication, without long-term current use of insulin (HCC)   Pneumonia of both lungs due to infectious organism   Hypokalemia   Acute respiratory failure with hypoxia (HCC)   Malnutrition of moderate degree   Haemophilus infection   Penicillin allergy    Joseph Powell is a 66 y.o. 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  # H flu pneumonia and bacteremia:  We will switch him over to Augmentin which she can start tomorrow to complete  seven days of treatment  #2  Penicillin allergy tolerated amoxicillin so we will remove this from his allergy list  #3 otitis media: Seems to be resolving  #4 parainfluenza  infection continue droplet precautions  I will sign off for now please call if further questions   LOS: 6 days   Rhina Brackett Dam 10/22/2021, 4:00 PM

## 2021-10-23 DIAGNOSIS — B348 Other viral infections of unspecified site: Secondary | ICD-10-CM | POA: Diagnosis not present

## 2021-10-23 DIAGNOSIS — J9601 Acute respiratory failure with hypoxia: Secondary | ICD-10-CM | POA: Diagnosis not present

## 2021-10-23 DIAGNOSIS — A419 Sepsis, unspecified organism: Secondary | ICD-10-CM | POA: Diagnosis not present

## 2021-10-23 DIAGNOSIS — J189 Pneumonia, unspecified organism: Secondary | ICD-10-CM | POA: Diagnosis not present

## 2021-10-23 LAB — BASIC METABOLIC PANEL
Anion gap: 7 (ref 5–15)
BUN: 12 mg/dL (ref 8–23)
CO2: 31 mmol/L (ref 22–32)
Calcium: 8.4 mg/dL — ABNORMAL LOW (ref 8.9–10.3)
Chloride: 99 mmol/L (ref 98–111)
Creatinine, Ser: 0.62 mg/dL (ref 0.61–1.24)
GFR, Estimated: >60 mL/min (ref >60)
Glucose, Bld: 118 mg/dL — ABNORMAL HIGH (ref 70–99)
Potassium: 3.3 mmol/L — ABNORMAL LOW (ref 3.5–5.1)
Sodium: 137 mmol/L (ref 135–145)

## 2021-10-23 LAB — GLUCOSE, CAPILLARY
Glucose-Capillary: 132 mg/dL — ABNORMAL HIGH (ref 70–99)
Glucose-Capillary: 181 mg/dL — ABNORMAL HIGH (ref 70–99)
Glucose-Capillary: 189 mg/dL — ABNORMAL HIGH (ref 70–99)
Glucose-Capillary: 226 mg/dL — ABNORMAL HIGH (ref 70–99)
Glucose-Capillary: 94 mg/dL (ref 70–99)
Glucose-Capillary: 96 mg/dL (ref 70–99)

## 2021-10-23 LAB — CBC
HCT: 38.2 % — ABNORMAL LOW (ref 39.0–52.0)
Hemoglobin: 13 g/dL (ref 13.0–17.0)
MCH: 31.5 pg (ref 26.0–34.0)
MCHC: 34 g/dL (ref 30.0–36.0)
MCV: 92.5 fL (ref 80.0–100.0)
Platelets: 130 10*3/uL — ABNORMAL LOW (ref 150–400)
RBC: 4.13 MIL/uL — ABNORMAL LOW (ref 4.22–5.81)
RDW: 13.3 % (ref 11.5–15.5)
WBC: 15.9 10*3/uL — ABNORMAL HIGH (ref 4.0–10.5)
nRBC: 0 % (ref 0.0–0.2)

## 2021-10-23 MED ORDER — POTASSIUM CHLORIDE CRYS ER 20 MEQ PO TBCR
40.0000 meq | EXTENDED_RELEASE_TABLET | Freq: Once | ORAL | Status: AC
  Administered 2021-10-23: 40 meq via ORAL
  Filled 2021-10-23: qty 2

## 2021-10-23 NOTE — Progress Notes (Signed)
NAME:  Joseph Powell, MRN:  [id number], DOB:  [birth year], LOS: 7 ADMISSION DATE:  10/16/2021, CONSULTATION DATE:  10/17/21 REFERRING MD:  Dr Tyrell Antonio, CHIEF COMPLAINT:  Acute hypoxemic resp virust - PArainfleunza 3    BRIEF  Mr. Powell is a 66 year old male who is status post [facility]OP and stem cell transplant for history of lymphoma T-cell non-Hodgkin's believed to be in complete remission in 2019.  He follows with Dr. Christinia Gully for chronic cough asthma and allergies.  2018 had normal spirometry and allergy profile in September 2022 with normal IgE and no eosinophils in 2023 exhaled nitric oxide was normal.  Most recent chest x-ray was in April 2023 with peribronchial thickening but no infiltrates [in 2019 did have a CT scan of the chest that had a left lower lobe infiltrate] most recent pulmonary clinic follow-up April 2023 showed improvement in cough with short course prednisone.  Consider this cough variant asthma  Now in the middle of May 2023 he went on a remote close and also travel to [place], [place], [place].  1 week into this day because of lack of air conditioning a left is with the room open and had a fan blowing into his face.  Shortly after that he and his wife got sick.  This was around May 27/28 2023.  Then on October 03, 2021 he was evaluated by a physician and then again also on October 08, 2021.  At no point his pulse ox was checked.  He was given azithromycin.  His wife recovered with the same antibiotic but [place] starting her on October 08, 2021 was actually getting short of breath.  He arrived back in [place] on November 01, 2021.  By this time he felt like he was hypoxemic and also short of breath with minimal exertion and having significant cough.  Presented to emergency room 10/16/2021 found to be acute hypoxemic respiratory failure.  Had bilateral lower lobe pulmonary infiltrates.  White count was elevated.  Started on Antibiotics but procalcitonin normal.  Also given  fluids.  If progressive worsening hypoxemia in the emergency department admitted to progressive unit at [facility].  Pulmonary critical care consulted morning of 10/17/2021 requiring 25 L high flow nasal cannula.  Desaturations with cough noted.  Some associated malaise and poor appetite.  No chest pains.  The cough is dry.  No edema no hemoptysis.  CT angiogram dated, troponin, D-dimer, echo data not available.  No urine strep or Legionella.  He is positive for parainfluenza virus 3.   Past Medical History:    has a past medical history of Allergic rhinitis, Asthma, Bell's palsy, GERD (gastroesophageal reflux disease), Gout, Hypercholesteremia, Melanoma ([place]), Osteoarthritis, Sleep apnea, Thrombocytopenia ([place]), and Tubular adenoma.   has a past surgical history that includes Knee surgery (1993); Vasectomy (1999); Nasal reconstruction; Melanoma excision; IR US Guide Vasc Access Right (02/17/2017); IR FLUORO GUIDE PORT INSERTION RIGHT (02/17/2017); and IR REMOVAL TUN ACCESS W/ PORT W/O FL MOD SED (01/05/2018).    Significant Hospital Events:  10/16/2021 - admit. PIV 3+ 6/15 - ccm consut. 25L . Start BiPAP x 2 in day with QHS and IV Steroid Solumedrol  - CTA - neg PE  - BCID /14 wirh H Flu  - QUant gold -    - Urine strep - neg  - coviud IgG - neg  = HIV neg  - PCT < 0.1  - ESR 32  - ECHO - LVEF 65%. Normal DDx. Mild reduced  RV Function  - ANA/RF - 6/16 - NEG  - BCID - H Flu  6/16- di dnot toelrated BiPAP last night.  He says nurses thought he was anxious. He denies it. Says the BiPAP produced no air. He is willing to try BiPAP this AM. REmains on 25L HFNC . BCID with H Flu. Abx being narrowed to ceftriaxone. Coughs and when sat our of bed on 25L [place] - desat to 85%-88%.  Movements produce cough.  START $Remo'200mg'veHYj$  per day IV hydrocort  6/17 - 6/17 - Used BiPAP last night. BP high. On hydrocort $RemoveBefo'200mg'aALKFBEfhiN$  per day for CAP (D3 of steroids, D2 hydrocor), Now on 20L HFNC with 40% fio2. AFebrile.   And by Afetrnoon down to 15L [place] -> in room turned down to 30% fio2/10L an dstill pulse ox 99%. Coughed and did not desat . Wife reports  - HOH since illness started. On flonase. ENT consult pending per her  - Low appetite  SUBJECTIVE/OVERNIGHT/INTERVAL HX  Continues to feel better. Remains on 4L [place] with sats in the low to mid 90s.   Objective   Blood pressure 126/73, pulse 81, temperature 97.6 F (36.4 C), temperature source Oral, resp. rate (!) 26, height $RemoveBe'5\' 11"'wCjJOZFtt$  (1.803 m), weight 84.8 kg, SpO2 95 %.        Intake/Output Summary (Last 24 hours) at 10/23/2021 1022 Last data filed at 10/23/2021 1013 Gross per 24 hour  Intake 985 ml  Output 950 ml  Net 35 ml    Filed Weights   10/16/21 0800  Weight: 84.8 kg    General Appearance: adult male of normal body habitus in NAD HEENT: [place]/AT, PERRL, no JVD Neck: Supple Lungs: No distress on 4L [place]. Bilateral rales.  Heart: RRR, no MRG Abdomen: Soft, non-tender, non-distended.  Extremities: no acute deformity no edema.  Skin: Grossly intact.  Neurologic: Alert, oriented, non-focal   WBC 15.9 CXR personally reviewed-improving bilateral opacities  Assessment & Plan:   Acute hypoxic respiratory failure with bilateral viral pneumonia due to parainfluenza virus History of chronic cough secondary to upper airway cough syndrome - Supplemental O2 titrated to a sat of 90% - Continue prednisone. 40 mg started 6/21.Plan to decrease dose by $Remov'10mg'lhwWnp$  every three days until off.  - Hycodan cough syrup PRN - Loratadine and pantoprazole for upper airway cough syndrome - IS - Anticipate recovery will occur over several weeks.  May eventually need to go home on low-dose oxygen and be weaned as an outpatient.  Bacteremia due to H influenza - Antibiotics transitioned to augmentin for a 7 day course per ID  Physical deconditioning - onset in hospital -OOB mobility, up to chair, walking as able - PT seeing    Followup  - APP Tammy followup (she  knows him) -> then Dr Chase Caller (switching from Dr Melvyn Novas) ; office sent a message - Needs OPD CT Scan in 3 months    Best practice (daily eval):  Per primary  Goals of Care:       LABS     CBC Recent Labs  Lab 10/21/21 0415 10/22/21 0446 10/23/21 0546  HGB 13.4 13.6 13.0  HCT 39.5 40.4 38.2*  WBC 16.7* 16.5* 15.9*  PLT 144* 131* 130*       CHEMISTRY Recent Labs  Lab 10/17/21 0507 10/18/21 0735 10/19/21 0718 10/20/21 0527 10/21/21 0415 10/22/21 0446 10/23/21 0546  NA 139 141 141 139 140 139 137  K 3.8 3.4* 4.0 3.5 3.4* 3.6 3.3*  CL 103 102 103 101  99 100 99  CO2 $Re'26 29 29 29 'NKe$ 32 31 31  GLUCOSE 152* 229* 138* 155* 116* 109* 118*  BUN 6* $Remo'10 12 15 12 12 12  'IhEzJ$ CREATININE 0.55* 0.66 0.70 0.66 0.64 0.70 0.62  CALCIUM 8.1* 8.8* 9.2 9.0 8.7* 8.6* 8.4*  MG 1.9 2.2 2.2  --   --   --   --   PHOS  --  3.0  --   --   --   --   --       Georgann Housekeeper, AGACNP-BC  Pulmonary & Critical Care  See Amion for personal pager PCCM on call pager [phone] until 7pm. Please call Elink 7p-7a. [phone]  10/23/2021 10:32 AM

## 2021-10-23 NOTE — Progress Notes (Signed)
Physical Therapy Treatment Patient Details Name: Joseph Powell MRN: [id number] DOB: [birth year] Today's Date: 10/23/2021   History of Present Illness 66 year old with past medical history significant for lymphoma diagnosed 2018, had stem cell transplant 2019, has done well since that time.  Pt admitted 10/16/21 with hypoxia and found to have Haemophilus Influenzae  Bacteremia, para-influenza 3. Bacterial PNA.  PMHx: Allergic rhinitis, Asthma, Bell's palsy, GERD (gastroesophageal reflux disease), Gout, Hypercholesteremia, Melanoma, Osteoarthritis, Sleep apnea, Thrombocytopenia, and Tubular adenoma    PT Comments    Pt desats to 84% on 4L with ambulation, increased to 6L (unable to increase to 5L due to tank) with SpO2 98%. Pt performs 5x STS on 3L with SpO2 96% and 97% at EOS. Pt using RW for energy conservation, educated to not lean onto it unless fatigued with good carryover. Pt denies fatigue, dyspnea 2/4, coughing intermittently throughout session. Educated pt on time OOB and multiple ambulation outs daily with nursing as able and pt verbalizes agreement.   Recommendations for follow up therapy are one component of a multi-disciplinary discharge planning process, led by the attending physician.  Recommendations may be updated based on patient status, additional functional criteria and insurance authorization.  Follow Up Recommendations  Outpatient PT     Assistance Recommended at Discharge PRN  Patient can return home with the following Help with stairs or ramp for entrance;Assistance with cooking/housework   Equipment Recommendations  None recommended by PT    Recommendations for Other Services       Precautions / Restrictions Precautions Precautions: Other (comment) (Droplet) Precaution Comments: monitor sats Restrictions Weight Bearing Restrictions: No     Mobility  Bed Mobility Overal bed mobility: Modified Independent    Transfers Overall transfer level: Needs  assistance Equipment used: None Transfers: Sit to/from Stand Sit to Stand: Supervision  General transfer comment: supv to power to stand, therapist managing lines for safety    Ambulation/Gait Ambulation/Gait assistance: Supervision Gait Distance (Feet): 180 Feet Assistive device: Rolling walker (2 wheels) Gait Pattern/deviations: Step-through pattern, Decreased stride length Gait velocity: decreased  General Gait Details: slow, steady step through gait pattern with RW, mild coughing intermittently during ambulation, desat to 84% on 4L and unable to improve so increased to 6L (unable to go to 5L on tank) with SpO2 98%   Stairs             Wheelchair Mobility    Modified Rankin (Stroke Patients Only)       Balance Overall balance assessment: No apparent balance deficits (not formally assessed)     Cognition Arousal/Alertness: Awake/alert Behavior During Therapy: WFL for tasks assessed/performed Overall Cognitive Status: Within Functional Limits for tasks assessed     Exercises      General Comments General comments (skin integrity, edema, etc.): Pt on 3L O2 at rest at begining of session with SpO2 98%. With ambulation, desats to 84% and unable to recover with increase to 4L so increased to 6L and SpO2 98%. Once returned to room, pt performs 5x STS on 3L with SpO2 96%. At EOS pt on 3L with SpO2 97%. Dyspnea 2/4 throughout session- RN notified of SpO2      Pertinent Vitals/Pain Pain Assessment Pain Assessment: No/denies pain    Home Living                          Prior Function            PT Goals (current goals can now  be found in the care plan section) Acute Rehab PT Goals PT Goal Formulation: With patient Time For Goal Achievement: 11/02/21 Potential to Achieve Goals: Good Progress towards PT goals: Progressing toward goals    Frequency    Min 3X/week      PT Plan Current plan remains appropriate    Co-evaluation               AM-PAC PT "6 Clicks" Mobility   Outcome Measure  Help needed turning from your back to your side while in a flat bed without using bedrails?: A Little Help needed moving from lying on your back to sitting on the side of a flat bed without using bedrails?: A Little Help needed moving to and from a bed to a chair (including a wheelchair)?: A Little Help needed standing up from a chair using your arms (e.g., wheelchair or bedside chair)?: A Little Help needed to walk in hospital room?: A Little Help needed climbing 3-5 steps with a railing? : A Lot 6 Click Score: 17    End of Session Equipment Utilized During Treatment: Oxygen Activity Tolerance: Patient tolerated treatment well Patient left: in chair;with call bell/phone within reach;with family/visitor present Nurse Communication: Mobility status PT Visit Diagnosis: Other abnormalities of gait and mobility (R26.89)     Time: 7989-2119 PT Time Calculation (min) (ACUTE ONLY): 25 min  Charges:  $Gait Training: 8-22 mins $Self Care/Home Management: 8-22                      Tori Sila Sarsfield PT, DPT 10/23/21, 11:23 AM

## 2021-10-23 NOTE — Progress Notes (Signed)
Mr. Adamec walk with PT as well as nursing staff today. He requires 2L at rest, 4L at exertion. Ambulated 372f.

## 2021-10-23 NOTE — Progress Notes (Addendum)
PROGRESS NOTE    Joseph Powell  IRJ:[id number] DOB: 1956-04-23 DOA: 10/16/2021 PCP: Faustino Congress, NP   Brief Narrative: 66 year old with past medical history significant for lymphoma diagnosed 2018, had stem cell transplant 2019, has done well since that time.  Followed by oncologist at [facility], last evaluation August 16, 2021, patient is in remission.  Patient present with worsening shortness of breath, and fevers.  He just returned from a trip from [place], 6/13. He developed shortness of breath and congestion, June first.  During these illness he received azithromycin initially for 3 days then subsequently extended to 7 days.  He presents with worsening shortness of breath, cough.    He was found to be hypoxic oxygen saturation 86 on room air, he required up to 9 L high flow oxygen. Chest x ray: Background pattern of scattered pulmonary scaring.  Superimposed acute bronchial thickening and patchy pulmonary infiltrates in the mid and lower lungs consistent with bronchopneumonia.  Patient found to have Haemophilus Influenzae  Bacteremia, para-influenza 3. Bacterial PNA. Acute hypoxic respiratory failure.  Treated with IV ceftriaxone and IV hydrocortisone. His oxygen requirement has significantly improved. He is down to 6 L oxygen today. We need to make sure his oxygen requirement of ambulation is stable prior to discharge.   ID planning on transition to oral Augmentin or amoxicillin if he is doing well to complete 7 days.   Assessment & Plan:   Principal Problem:   Sepsis due to pneumonia [facility]) Active Problems:   Pneumonia of both lungs due to infectious organism   Acute respiratory failure with hypoxia ([place])   Type 2 diabetes mellitus without complication, without long-term current use of insulin (HCC)   Cough variant asthma vs UACS   Hypokalemia   GERD without esophagitis   Gout   Malnutrition of moderate degree   Haemophilus infection   Penicillin allergy   Multifocal  pneumonia   Non-recurrent acute suppurative otitis media of both ears without spontaneous rupture of tympanic membranes  1-Sepsis secondary to parainfluenza pneumonia and Haemophilus Influenzae Bacteremia, POA: -Parainfluenza 3 Positive.  -Blood culture. Haemophilus Influenzae -Sputum culture: rare respiratory flora, legionella antigen Negative, strep pneumonia negative. MRSA PCR> negative. CTA negative for PE> .  Been consulted.  He was started on Rocephin which was transitioned to Augmentin and plan to continue for total of 7 days.  Patient is afebrile.  Acute hypoxic respiratory failure secondary to pneumonia, : Bilateral Pneumonia; Parainfluenza and Haemophilus Influenzae PNA -Patent required  HFNC Oxygen  25 L 50 % on admission.  -CCM consulted. ANA negative, RF: 13. Cyclic Citruli negative.  -Treated with BIPAP during hospitalization. Plan to continue with BIPAP PRN per CCM -Elevation BNP-- ECHO normal EF, Low normal RV function in setting of PNA> Received one dose of lasix.  Patient completed 200 mg IV hydrocortisone for 5 days.  -repeated Chest x ray 6/19: Which showed improved aeration to both lungs with residual left base and right mid lung airspace opacities.  -Discussed with Dr Carlis Abbott page--- started Prednisone 40 mg daily for 5 days from 6/21.  Currently saturating over 95% on 4 L.  He will likely need to go home on oxygen at this time.  Patient and wife at the  Hypokalemia: We will replace.  Diabetes type 2: On Semglee 10 units and SSI.  Blood sugar labile.  We will continue current management.  Cough variant asthma: Continue with nebulizer.   Hypokalemia: Resolved.  Suspect Otitis/bilateral hearing loss; suspect related to viral infection/Haemophilus influenzae. Marland Kitchen  Claritin/  Hearing loss persist.  Seen by and appreciate Dr Constance Holster evaluation. Follow up Out patient.   Gout: Continue with Allopurinol.   Moderate circumferential thickening of the distal esophagus by CT;  continue PPI. Needs follow up when he recovers from PNA with gastroenterologist.   HTN; blood pressure fairly controlled.  Continue with Carvedilol. Started Norvasc. Peralta Hydralazine.   Elevated D dimer: CTA negative for PE. Doppler LE; Negative for DVT.   Nutrition; poor appetite.  Started ensure. Snacks.  Nutrition consultation.  Mild thrombocytopenia; monitor.   Malar rash; for completeness Double strand DNA> ordered, negative.   Estimated body mass index is 26.08 kg/m as calculated from the following:   Height as of this encounter: '5\' 11"'$  (1.803 m).   Weight as of this encounter: 84.8 kg.   DVT prophylaxis: Lovenox Code Status: Full code Family Communication: Wife during rounds today.  Disposition Plan:  Status is: Inpatient Remains inpatient appropriate because: remain inpatient for management of Resp failure.     Consultants:  CCM  Procedures:  ECHO EF 65 %  Antimicrobials:  Cefepime, Doxy   Subjective: Patient seen and examined.  Wife at the bedside.  Patient on 4 L of oxygen this morning, denies any shortness of breath but he is worried about occasional desaturation when he coughs.  Objective: Vitals:   10/23/21 0811 10/23/21 1000 10/23/21 1005 10/23/21 1035  BP:  126/73    Pulse:  81    Resp:  (!) 26 (!) 23   Temp:      TempSrc:      SpO2: 96% 95% 97% 97%  Weight:      Height:        Intake/Output Summary (Last 24 hours) at 10/23/2021 1404 Last data filed at 10/23/2021 1100 Gross per 24 hour  Intake 1345 ml  Output 750 ml  Net 595 ml    Filed Weights   10/16/21 0800  Weight: 84.8 kg    Examination:  General exam: Appears calm and comfortable  Respiratory system: Rhonchi bilaterally, respiratory effort normal. Cardiovascular system: S1 & S2 heard, RRR. No JVD, murmurs, rubs, gallops or clicks. No pedal edema. Gastrointestinal system: Abdomen is nondistended, soft and nontender. No organomegaly or masses felt. Normal bowel sounds  heard. Central nervous system: Alert and oriented. No focal neurological deficits. Extremities: Symmetric 5 x 5 power. Skin: No rashes, lesions or ulcers.  Psychiatry: Judgement and insight appear normal. Mood & affect appropriate.   Data Reviewed: I have personally reviewed following labs and imaging studies  CBC: Recent Labs  Lab 10/17/21 0736 10/18/21 0735 10/19/21 0718 10/20/21 0527 10/21/21 0415 10/22/21 0446 10/23/21 0546  WBC 17.7* 13.0* 20.6* 18.3* 16.7* 16.5* 15.9*  NEUTROABS 12.2* 10.1*  --  13.7*  --   --   --   HGB 12.1* 13.4 13.5 13.3 13.4 13.6 13.0  HCT 37.0* 40.4 41.5 40.4 39.5 40.4 38.2*  MCV 97.1 96.4 97.6 96.2 94.5 94.4 92.5  PLT 125* 159 160 150 144* 131* 130*    Basic Metabolic Panel: Recent Labs  Lab 10/17/21 0507 10/18/21 0735 10/19/21 0718 10/20/21 0527 10/21/21 0415 10/22/21 0446 10/23/21 0546  NA 139 141 141 139 140 139 137  K 3.8 3.4* 4.0 3.5 3.4* 3.6 3.3*  CL 103 102 103 101 99 100 99  CO2 '26 29 29 29 '$ 32 31 31  GLUCOSE 152* 229* 138* 155* 116* 109* 118*  BUN 6* '10 12 15 12 12 12  '$ CREATININE 0.55* 0.66 0.70 0.66  0.64 0.70 0.62  CALCIUM 8.1* 8.8* 9.2 9.0 8.7* 8.6* 8.4*  MG 1.9 2.2 2.2  --   --   --   --   PHOS  --  3.0  --   --   --   --   --     GFR: Estimated Creatinine Clearance: 96.7 mL/min (by C-G formula based on SCr of 0.62 mg/dL). Liver Function Tests: Recent Labs  Lab 10/17/21 0507  AST 43*  ALT 33  ALKPHOS 55  BILITOT 0.7  PROT 5.2*  ALBUMIN 2.6*    No results for input(s): "LIPASE", "AMYLASE" in the last 168 hours. No results for input(s): "AMMONIA" in the last 168 hours. Coagulation Profile: No results for input(s): "INR", "PROTIME" in the last 168 hours.  Cardiac Enzymes: No results for input(s): "CKTOTAL", "CKMB", "CKMBINDEX", "TROPONINI" in the last 168 hours. BNP (last 3 results) No results for input(s): "PROBNP" in the last 8760 hours. HbA1C: No results for input(s): "HGBA1C" in the last 72  hours.  CBG: Recent Labs  Lab 10/22/21 2027 10/23/21 0010 10/23/21 0334 10/23/21 0726 10/23/21 1128  GLUCAP 228* 94 132* 96 189*    Lipid Profile: No results for input(s): "CHOL", "HDL", "LDLCALC", "TRIG", "CHOLHDL", "LDLDIRECT" in the last 72 hours. Thyroid Function Tests: No results for input(s): "TSH", "T4TOTAL", "FREET4", "T3FREE", "THYROIDAB" in the last 72 hours. Anemia Panel: No results for input(s): "VITAMINB12", "FOLATE", "FERRITIN", "TIBC", "IRON", "RETICCTPCT" in the last 72 hours. Sepsis Labs: Recent Labs  Lab 10/17/21 0507 10/18/21 0735 10/19/21 0537  PROCALCITON <0.10 <0.10 <0.10     Recent Results (from the past 240 hour(s))  Culture, blood (Routine x 2)     Status: None   Collection Time: 10/16/21  8:13 AM   Specimen: BLOOD LEFT ARM  Result Value Ref Range Status   Specimen Description   Final    BLOOD LEFT ARM Performed at [facility], [address] [place], [place] [address]    Special Requests   Final    BOTTLES DRAWN AEROBIC AND ANAEROBIC Blood Culture adequate volume Performed at [facility], [address] [place], [place] [address]    Culture   Final    NO GROWTH 5 DAYS Performed at [facility], [place] [address], [place], [place] [address]    Report Status 10/21/2021 FINAL  Final  Culture, blood (Routine x 2)     Status: Abnormal (Preliminary result)   Collection Time: 10/16/21  8:13 AM   Specimen: BLOOD RIGHT ARM  Result Value Ref Range Status   Specimen Description   Final    BLOOD RIGHT ARM Performed at [facility], [address] [place], [place] [address]    Special Requests   Final    BOTTLES DRAWN AEROBIC AND ANAEROBIC Blood Culture adequate volume Performed at [facility], [address], [place], [place] [address]    Culture  Setup Time   Final    GRAM NEGATIVE RODS ANAEROBIC BOTTLE ONLY CRITICAL RESULT CALLED TO, READ BACK BY AND  VERIFIED WITH: PHARMD ELLEN JACKSON 10/18/21'@2'$ :30 BY TW    Culture (A)  Final    HAEMOPHILUS INFLUENZAE BETA LACTAMASE NEGATIVE HEALTH DEPARTMENT NOTIFIED Referred to [facility] in [place], [place] for serotyping. Performed at [facility], [place] [address] [place], [place] [address]    Report Status PENDING  Incomplete  Blood Culture ID Panel (Reflexed)     Status: Abnormal   Collection Time: 10/16/21  8:13 AM  Result Value Ref Range Status   Enterococcus faecalis NOT DETECTED NOT DETECTED Final   Enterococcus Faecium NOT DETECTED NOT DETECTED Final   Listeria monocytogenes NOT DETECTED NOT DETECTED Final   Staphylococcus species NOT DETECTED NOT DETECTED Final   Staphylococcus aureus (BCID) NOT DETECTED NOT DETECTED Final   Staphylococcus epidermidis NOT DETECTED NOT DETECTED Final   Staphylococcus lugdunensis NOT DETECTED NOT DETECTED Final   Streptococcus species NOT DETECTED NOT DETECTED Final   Streptococcus agalactiae NOT DETECTED NOT DETECTED Final   Streptococcus pneumoniae NOT DETECTED NOT DETECTED Final   Streptococcus pyogenes NOT DETECTED NOT DETECTED Final   A.calcoaceticus-baumannii NOT DETECTED NOT DETECTED Final   Bacteroides fragilis NOT DETECTED NOT DETECTED Final   Enterobacterales NOT DETECTED NOT DETECTED Final   Enterobacter cloacae complex NOT DETECTED NOT DETECTED Final   Escherichia coli NOT DETECTED NOT DETECTED Final   Klebsiella aerogenes NOT DETECTED NOT DETECTED Final   Klebsiella oxytoca NOT DETECTED NOT DETECTED Final   Klebsiella pneumoniae NOT DETECTED NOT DETECTED Final   Proteus species NOT DETECTED NOT DETECTED Final   Salmonella species NOT DETECTED NOT DETECTED Final   Serratia marcescens NOT DETECTED NOT DETECTED Final   Haemophilus influenzae DETECTED (A) NOT DETECTED Final    Comment: CRITICAL RESULT CALLED TO, READ BACK BY AND VERIFIED WITH: PHARMD ELLEN JACKSON 10/18/21'@2'$ :30 BY TW    Neisseria meningitidis NOT DETECTED NOT  DETECTED Final   Pseudomonas aeruginosa NOT DETECTED NOT DETECTED Final   Stenotrophomonas maltophilia NOT DETECTED NOT DETECTED Final   Candida albicans NOT DETECTED NOT DETECTED Final   Candida auris NOT DETECTED NOT DETECTED Final   Candida glabrata NOT DETECTED NOT DETECTED Final   Candida krusei NOT DETECTED NOT DETECTED Final   Candida parapsilosis NOT DETECTED NOT DETECTED Final   Candida tropicalis NOT DETECTED NOT DETECTED Final   Cryptococcus neoformans/gattii NOT DETECTED NOT DETECTED Final    Comment: Performed at [facility], [address], [place], [place] [address]  Resp Panel by RT-PCR (Flu A&B, Covid) Anterior Nasal Swab     Status: None   Collection Time: 10/16/21  8:55 AM   Specimen: Anterior Nasal Swab  Result Value Ref Range Status   SARS Coronavirus 2 by RT PCR NEGATIVE NEGATIVE Final    Comment: (NOTE) SARS-CoV-2 target nucleic acids are NOT DETECTED.  The SARS-CoV-2 RNA is generally detectable in upper respiratory specimens during the acute phase of infection. The lowest concentration of SARS-CoV-2 viral copies this assay can detect is 138 copies/mL. A negative result does not preclude SARS-Cov-2 infection and should not be used as the sole basis for treatment or other patient management decisions. A negative result may occur with  improper specimen collection/handling, submission of specimen other than nasopharyngeal swab, presence of viral mutation(s) within the areas targeted by this assay, and inadequate number of viral copies(<138 copies/mL). A negative result must be combined with clinical observations, patient history, and epidemiological information. The expected result is Negative.  Fact Sheet for Patients:  EntrepreneurPulse.com.au  Fact Sheet for Healthcare Providers:  IncredibleEmployment.be  This test is no t yet approved or cleared by the [place] FDA and  has been authorized for detection  and/or diagnosis of SARS-CoV-2 by FDA under an Emergency Use Authorization (EUA). This EUA will remain  in effect (meaning this test can be used) for the duration of the COVID-19 declaration under Section 564(b)(1) of the Act, 21 U.S.C.section 360bbb-3(b)(1), unless the authorization is terminated  or revoked sooner.  Influenza A by PCR NEGATIVE NEGATIVE Final   Influenza B by PCR NEGATIVE NEGATIVE Final    Comment: (NOTE) The Xpert Xpress SARS-CoV-2/FLU/RSV plus assay is intended as an aid in the diagnosis of influenza from Nasopharyngeal swab specimens and should not be used as a sole basis for treatment. Nasal washings and aspirates are unacceptable for Xpert Xpress SARS-CoV-2/FLU/RSV testing.  Fact Sheet for Patients: EntrepreneurPulse.com.au  Fact Sheet for Healthcare Providers: IncredibleEmployment.be  This test is not yet approved or cleared by the [place] FDA and has been authorized for detection and/or diagnosis of SARS-CoV-2 by FDA under an Emergency Use Authorization (EUA). This EUA will remain in effect (meaning this test can be used) for the duration of the COVID-19 declaration under Section 564(b)(1) of the Act, 21 U.S.C. section 360bbb-3(b)(1), unless the authorization is terminated or revoked.  Performed at [facility], [address] [place], [place] [address]   Respiratory (~20 pathogens) panel by PCR     Status: Abnormal   Collection Time: 10/16/21 10:50 AM   Specimen: Nasopharyngeal Swab; Respiratory  Result Value Ref Range Status   Adenovirus NOT DETECTED NOT DETECTED Final   Coronavirus 229E NOT DETECTED NOT DETECTED Final    Comment: (NOTE) The Coronavirus on the Respiratory Panel, DOES NOT test for the novel  Coronavirus (2019 nCoV)    Coronavirus HKU1 NOT DETECTED NOT DETECTED Final   Coronavirus NL63 NOT DETECTED NOT DETECTED Final   Coronavirus OC43 NOT DETECTED NOT DETECTED  Final   Metapneumovirus NOT DETECTED NOT DETECTED Final   Rhinovirus / Enterovirus NOT DETECTED NOT DETECTED Final   Influenza A NOT DETECTED NOT DETECTED Final   Influenza B NOT DETECTED NOT DETECTED Final   Parainfluenza Virus 1 NOT DETECTED NOT DETECTED Final   Parainfluenza Virus 2 NOT DETECTED NOT DETECTED Final   Parainfluenza Virus 3 DETECTED (A) NOT DETECTED Final   Parainfluenza Virus 4 NOT DETECTED NOT DETECTED Final   Respiratory Syncytial Virus NOT DETECTED NOT DETECTED Final   Bordetella pertussis NOT DETECTED NOT DETECTED Final   Bordetella Parapertussis NOT DETECTED NOT DETECTED Final   Chlamydophila pneumoniae NOT DETECTED NOT DETECTED Final   Mycoplasma pneumoniae NOT DETECTED NOT DETECTED Final    Comment: Performed at [facility], [address], [place], [place] [address]  Urine Culture     Status: None   Collection Time: 10/16/21 11:20 AM   Specimen: In/Out Cath Urine  Result Value Ref Range Status   Specimen Description   Final    IN/OUT CATH URINE Performed at [facility], [address] [place] [address] [place], [place] [address]    Special Requests   Final    NONE Performed at [facility], [address] [place], [place] [address]    Culture   Final    NO GROWTH Performed at [facility], [place] [address] [place], [place] [address]    Report Status 10/17/2021 FINAL  Final  Expectorated Sputum Assessment w Gram Stain, Rflx to Resp Cult     Status: None   Collection Time: 10/17/21  4:31 AM   Specimen: Sputum  Result Value Ref Range Status   Specimen Description SPUTUM  Final   Special Requests NONE  Final   Sputum evaluation   Final    THIS SPECIMEN IS ACCEPTABLE FOR SPUTUM CULTURE Performed at [facility], [place] [address], [place],  [address]    Report Status 10/17/2021 FINAL  Final  Culture, Respiratory w Gram  Stain     Status: None   Collection Time: 10/17/21  4:31 AM   Specimen:  SPU  Result Value Ref Range Status   Specimen Description   Final    SPUTUM Performed at [place] [address] [place], [place] [address]    Special Requests   Final    NONE Reflexed from [id number] Performed at [facility], [place] [address], [place], [place] [address]    Gram Stain   Final    ABUNDANT WBC PRESENT, PREDOMINANTLY PMN FEW GRAM POSITIVE COCCI FEW GRAM NEGATIVE RODS FEW GRAM POSITIVE RODS    Culture   Final    RARE Normal respiratory flora-no Staph aureus or Pseudomonas seen Performed at [facility], [address], [place], [place] [address]    Report Status 10/19/2021 FINAL  Final  MRSA Next Gen by PCR, Nasal     Status: None   Collection Time: 10/17/21  5:35 PM   Specimen: Nasal Mucosa; Nasal Swab  Result Value Ref Range Status   MRSA by PCR Next Gen NOT DETECTED NOT DETECTED Final    Comment: (NOTE) The GeneXpert MRSA Assay (FDA approved for NASAL specimens only), is one component of a comprehensive MRSA colonization surveillance program. It is not intended to diagnose MRSA infection nor to guide or monitor treatment for MRSA infections. Test performance is not FDA approved in patients less than 36 years old. Performed at [facility], [place] [address], [place], [place] [address]       Scheduled Meds:  allopurinol  300 mg Oral Daily   amLODipine  5 mg Oral Daily   amoxicillin-clavulanate  1 tablet Oral Q12H   carvedilol  3.125 mg Oral BID WC   enoxaparin (LOVENOX) injection  40 mg Subcutaneous Q24H   feeding supplement (GLUCERNA SHAKE)  237 mL Oral BID BM   fluticasone  1 spray Each Nare Daily   HYDROcodone bit-homatropine  5 mL Oral BID   insulin aspart  0-15 Units Subcutaneous Q4H   insulin glargine-yfgn  10 Units Subcutaneous Daily   ipratropium-albuterol  3 mL Nebulization BID   loratadine  10 mg Oral Daily   pantoprazole  40 mg Oral Daily   predniSONE  40 mg Oral Q breakfast    sodium chloride flush  3 mL Intravenous Q12H   Continuous Infusions:   LOS: 7 days   Darliss Cheney, MD [facility]   If 7PM-7AM, please contact night-coverage [email or web address]  10/23/2021, 2:04 PM

## 2021-10-23 NOTE — Telephone Encounter (Signed)
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

## 2021-10-24 ENCOUNTER — Inpatient Hospital Stay (HOSPITAL_COMMUNITY): Payer: BC Managed Care – PPO

## 2021-10-24 DIAGNOSIS — J189 Pneumonia, unspecified organism: Secondary | ICD-10-CM | POA: Diagnosis not present

## 2021-10-24 DIAGNOSIS — A419 Sepsis, unspecified organism: Secondary | ICD-10-CM | POA: Diagnosis not present

## 2021-10-24 LAB — GLUCOSE, CAPILLARY
Glucose-Capillary: 143 mg/dL — ABNORMAL HIGH (ref 70–99)
Glucose-Capillary: 149 mg/dL — ABNORMAL HIGH (ref 70–99)
Glucose-Capillary: 76 mg/dL (ref 70–99)
Glucose-Capillary: 80 mg/dL (ref 70–99)

## 2021-10-24 LAB — CBC WITH DIFFERENTIAL/PLATELET
Abs Immature Granulocytes: 1.29 10*3/uL — ABNORMAL HIGH (ref 0.00–0.07)
Basophils Absolute: 0.1 10*3/uL (ref 0.0–0.1)
Basophils Relative: 1 %
Eosinophils Absolute: 0 10*3/uL (ref 0.0–0.5)
Eosinophils Relative: 0 %
HCT: 40.1 % (ref 39.0–52.0)
Hemoglobin: 13.3 g/dL (ref 13.0–17.0)
Immature Granulocytes: 9 %
Lymphocytes Relative: 8 %
Lymphs Abs: 1.2 10*3/uL (ref 0.7–4.0)
MCH: 31.6 pg (ref 26.0–34.0)
MCHC: 33.2 g/dL (ref 30.0–36.0)
MCV: 95.2 fL (ref 80.0–100.0)
Monocytes Absolute: 2.4 10*3/uL — ABNORMAL HIGH (ref 0.1–1.0)
Monocytes Relative: 16 %
Neutro Abs: 10.1 10*3/uL — ABNORMAL HIGH (ref 1.7–7.7)
Neutrophils Relative %: 66 %
Platelets: 131 10*3/uL — ABNORMAL LOW (ref 150–400)
RBC: 4.21 MIL/uL — ABNORMAL LOW (ref 4.22–5.81)
RDW: 13.6 % (ref 11.5–15.5)
WBC: 15.1 10*3/uL — ABNORMAL HIGH (ref 4.0–10.5)
nRBC: 0 % (ref 0.0–0.2)

## 2021-10-24 LAB — BASIC METABOLIC PANEL
Anion gap: 7 (ref 5–15)
BUN: 13 mg/dL (ref 8–23)
CO2: 31 mmol/L (ref 22–32)
Calcium: 8.7 mg/dL — ABNORMAL LOW (ref 8.9–10.3)
Chloride: 99 mmol/L (ref 98–111)
Creatinine, Ser: 0.74 mg/dL (ref 0.61–1.24)
GFR, Estimated: >60 mL/min (ref >60)
Glucose, Bld: 86 mg/dL (ref 70–99)
Potassium: 3.2 mmol/L — ABNORMAL LOW (ref 3.5–5.1)
Sodium: 137 mmol/L (ref 135–145)

## 2021-10-24 MED ORDER — POTASSIUM CHLORIDE CRYS ER 20 MEQ PO TBCR
40.0000 meq | EXTENDED_RELEASE_TABLET | ORAL | Status: DC
  Administered 2021-10-24: 40 meq via ORAL
  Filled 2021-10-24: qty 2

## 2021-10-24 MED ORDER — PREDNISONE 20 MG PO TABS: 40.0000 mg | ORAL_TABLET | Freq: Every day | ORAL | 0 refills | Status: AC

## 2021-10-24 MED ORDER — AMLODIPINE BESYLATE 5 MG PO TABS: 5.0000 mg | ORAL_TABLET | Freq: Every day | ORAL | 0 refills | Status: DC

## 2021-10-24 NOTE — Discharge Summary (Signed)
PatientPhysician Discharge Summary  Joseph Powell PZW:[id number] DOB: 03/10/1956 DOA: 10/16/2021  PCP: Faustino Congress, NP  Admit date: 10/16/2021 Discharge date: 10/24/2021 30 Day Unplanned Readmission Risk Score    Flowsheet Row ED to Hosp-Admission (Current) from 10/16/2021 in [facility]  30 Day Unplanned Readmission Risk Score (%) 15.65 Filed at 10/24/2021 1201       This score is the patient's risk of an unplanned readmission within 30 days of being discharged (0 -100%). The score is based on dignosis, age, lab data, medications, orders, and past utilization.   Low:  0-14.9   Medium: 15-21.9   High: 22-29.9   Extreme: 30 and above          Admitted From: Home Disposition: Home  Recommendations for Outpatient Follow-up:  Follow up with PCP in 1-2 weeks Please obtain BMP/CBC in one week Follow-up with ENT within 1 week  Please follow up with your PCP on the following pending results: Unresulted Labs (From admission, onward)     Start     Ordered   10/17/21 0500  CBC with Differential/Platelet  Tomorrow morning,   R        10/17/21 [place]: None Equipment/Devices: Home oxygen  Discharge Condition: Stable CODE STATUS: Full code Diet recommendation: Cardiac  Subjective: Patient seen and examined.  He states that he is feeling much better.  No new complaint.  He would rather like to go home on oxygen and then staying in the hospital anymore.  Brief/Interim Summary: 66 year old with past medical history significant for lymphoma diagnosed 2018, had stem cell transplant 2019, has done well since that time.  Followed by oncologist at [facility], last evaluation August 16, 2021, patient is in remission.  Patient present with worsening shortness of breath, and fevers.  He just returned from a trip from [place], 6/13. He developed shortness of breath and congestion, June first.  During these illness he received  azithromycin initially for 3 days then subsequently extended to 7 days.  He presented with worsening shortness of breath, cough.     He was found to be hypoxic oxygen saturation 86 on room air, he required up to 9 L high flow oxygen. Chest x ray: Background pattern of scattered pulmonary scaring.  Superimposed acute bronchial thickening and patchy pulmonary infiltrates in the mid and lower lungs consistent with bronchopneumonia.   Patient found to have Haemophilus Influenzae  Bacteremia, para-influenza 3. Bacterial PNA. Acute hypoxic respiratory failure.  Treated with IV ceftriaxone and IV hydrocortisone. ID planning on transition to oral Augmentin or amoxicillin if he is doing well to complete 7 days.     1-Sepsis secondary to parainfluenza pneumonia and Haemophilus Influenzae Bacteremia, POA: -Parainfluenza 3 Positive.  -Blood culture positive for. Haemophilus Influenzae -Sputum culture: rare respiratory flora, legionella antigen Negative, strep pneumonia negative. MRSA PCR> negative. CTA negative for PE> .  ID consulted.  He was started on Rocephin which was transitioned to Augmentin and completed total 7 days of antibiotic therapy per ID recommendations.   Acute hypoxic respiratory failure secondary to pneumonia, : Bilateral Pneumonia; Parainfluenza and Haemophilus Influenzae PNA -Patent required  [facility] Oxygen  25 L 50 % on admission.  -CCM consulted. ANA negative, RF: 13. Cyclic Citruli negative.  -Treated with BIPAP during hospitalization. -Elevation BNP-- ECHO normal EF, Low normal RV function in setting of PNA> Received one dose of lasix.  Patient completed  200 mg IV hydrocortisone for 5 days and PCCM recommended 40 mg of p.o. prednisone daily for 5 days, he has received 2 days, he is being prescribed 3 more days. -repeated Chest x ray today showed improvement.  Currently patient is requiring 2 L of oxygen at rest and 4 L of oxygen with activity.  Patient is comfortable going home.  We  will arrange home oxygen for him.   Hypokalemia: Low again.  This will be replaced before discharge.  Diabetes type 2: On Semglee 10 units and SSI.  Blood sugar labile.  Resume home medications after discharge.   Cough variant asthma: Continue with nebulizer.    Hypokalemia: Resolved.   bilateral hearing loss/possible middle ear effusion; suspect related to viral infection/Haemophilus influenzae.  Hearing has improved.  He was seen By Dr Constance Holster who recommended outpatient follow-up.  Patient is aware.   Gout: Continue with Allopurinol.    Moderate circumferential thickening of the distal esophagus by CT; continue PPI. Needs follow up when he recovers from PNA with gastroenterologist.    HTN; blood pressure fairly controlled.  Continue with Carvedilol. Started Norvasc.   Elevated D dimer: CTA negative for PE. Doppler LE; Negative for DVT.    Nutrition; poor appetite.  Started ensure. Snacks.  Nutrition consultation.  Mild thrombocytopenia; monitor.    Malar rash; for completeness Double strand DNA> ordered, negative.   Discharge plan was discussed with patient and/or family member and they verbalized understanding and agreed with it.  Discharge Diagnoses:  Principal Problem:   Sepsis due to pneumonia [facility]) Active Problems:   Pneumonia of both lungs due to infectious organism   Acute respiratory failure with hypoxia ([place])   Type 2 diabetes mellitus without complication, without long-term current use of insulin (HCC)   Cough variant asthma vs UACS   Hypokalemia   GERD without esophagitis   Gout   Malnutrition of moderate degree   Haemophilus infection   Penicillin allergy   Multifocal pneumonia   Non-recurrent acute suppurative otitis media of both ears without spontaneous rupture of tympanic membranes    Discharge Instructions   Allergies as of 10/24/2021   No Known Allergies      Medication List     STOP taking these medications    Dulera 100-5 MCG/ACT  Aero Generic drug: mometasone-formoterol   levocetirizine 5 MG tablet Commonly known as: XYZAL       TAKE these medications    allopurinol 300 MG tablet Commonly known as: ZYLOPRIM 1 tablet   amLODipine 5 MG tablet Commonly known as: NORVASC Take 1 tablet (5 mg total) by mouth daily. Start taking on: October 25, 2021   benzonatate 200 MG capsule Commonly known as: TESSALON Take 1 capsule (200 mg total) by mouth 3 (three) times daily as needed for cough.   CALCIUM PO Take by mouth.   carvedilol 3.125 MG tablet Commonly known as: COREG Take 1 tablet (3.125 mg total) by mouth 2 (two) times daily with a meal. PATIENT MUST SCHEDULE ANNUAL APPOINTMENT FOR FUTURE REFILLS. FIRST ATTEMPT   chlorpheniramine 4 MG tablet Commonly known as: CHLOR-TRIMETON Take 4 mg by mouth 2 (two) times daily as needed for allergies.   predniSONE 20 MG tablet Commonly known as: DELTASONE Take 2 tablets (40 mg total) by mouth daily with breakfast for 3 days. Start taking on: October 25, 2021   VITAMIN B-12 PO Take by mouth.   Vitamin D (Ergocalciferol) 1.25 MG (50000 UNIT) Caps capsule Commonly known as: DRISDOL Take 1  capsule by mouth once a week               Durable Medical Equipment  (From admission, onward)           Start     Ordered   10/24/21 1303  For home use only DME oxygen  Once       Question Answer Comment  Length of Need Lifetime   Liters per Minute 4   Frequency Continuous (stationary and portable oxygen unit needed)   Oxygen delivery system Gas      10/24/21 1302   10/21/21 0703  For home use only DME oxygen  Once       Question Answer Comment  Length of Need 6 Months   Mode or (Route) Nasal cannula   Liters per Minute 6   Frequency Continuous (stationary and portable oxygen unit needed)   Oxygen delivery system Gas      10/21/21 0702            Follow-up Information     Izora Gala, MD Follow up.   Specialty: Otolaryngology Why: Call to  schedule hearing test and follow-up appointment upon discharge. Contact information: [address] [place] [address] [phone]         [facility] Follow up.   Specialty: Rehabilitation Why: They will call you for appt. Contact information: [address] [id number] mc [place] [phone]        Faustino Congress, NP Follow up in 1 week(s).   Specialty: Family Medicine Contact information: [place] [address] [phone]         Sanda Klein, MD .   Specialty: Cardiology Contact information: [address] [place]  [address] [phone]                No Known Allergies  Consultations: ID, ENT, PCCM   Procedures/Studies: DG CHEST PORT 1 VIEW  Result Date: 10/24/2021 CLINICAL DATA:  Community acquired pneumonia. CTA chest 10/17/2021 and two-view chest x-ray 10/21/2021. EXAM: PORTABLE CHEST 1 VIEW COMPARISON:  CT a chest 10/17/2021 and two-view chest x-ray 10/21/2021. FINDINGS: Heart size normal. Atherosclerotic calcifications are present at the aortic arch. Persistent right upper lobe airspace disease is stable. Left lower lobe airspace disease has improved slightly. No new airspace disease is present. Dilated loops of small bowel are noted in the upper abdomen. No free air. IMPRESSION: 1. Persistent right upper lobe airspace disease. 2. Slight improvement in left lower lobe airspace disease. 3. No new airspace disease. 4. Dilated loops of small bowel in the upper abdomen. Electronically Signed   By: San Morelle M.D.   On: 10/24/2021 12:30   DG Chest 2 View  Result Date: 10/21/2021 CLINICAL DATA:  Shortness of breath.  Pneumonia and sepsis. EXAM: CHEST - 2 VIEW COMPARISON:  10/17/2021 FINDINGS: Normal heart size and mediastinal contours. No signs of pleural effusion. Bilateral areas of subsegmental atelectasis noted.  Interval improvement in bilateral lower lung zone predominant reticular and nodular pulmonary opacities. Residual opacities are noted within the retrocardiac left lower lobe and right middle lobe. IMPRESSION: Improved aeration to both lungs with residual left base and right midlung airspace opacities. Electronically Signed   By: Kerby Moors M.D.   On: 10/21/2021 14:51   VAS [place] LOWER EXTREMITY VENOUS (DVT)  Result Date: 10/19/2021  Lower Venous DVT Study Patient Name:  Joseph Powell  Date of Exam:   10/18/2021 Medical  Rec #: [id number]          Accession #:    [id number] Date of Birth: 12-30-1955          Patient Gender: M Patient Age:   22 years Exam [facility]  [facility] Procedure:      VAS [place] LOWER EXTREMITY VENOUS (DVT) Referring Phys: Jerald Kief REGALADO --------------------------------------------------------------------------------  Indications: Edema.  Risk Factors: None identified. Comparison Study: No prior studies. Performing Technologist: Oliver Hum RVT  Examination Guidelines: A complete evaluation includes B-mode imaging, spectral Doppler, color Doppler, and power Doppler as needed of all accessible portions of each vessel. Bilateral testing is considered an integral part of a complete examination. Limited examinations for reoccurring indications may be performed as noted. The reflux portion of the exam is performed with the patient in reverse Trendelenburg.  +---------+---------------+---------+-----------+----------+--------------+ RIGHT    CompressibilityPhasicitySpontaneityPropertiesThrombus Aging +---------+---------------+---------+-----------+----------+--------------+ CFV      Full           Yes      Yes                                 +---------+---------------+---------+-----------+----------+--------------+ SFJ      Full                                                        +---------+---------------+---------+-----------+----------+--------------+ FV  Prox  Full                                                        +---------+---------------+---------+-----------+----------+--------------+ FV Mid   Full                                                        +---------+---------------+---------+-----------+----------+--------------+ FV DistalFull                                                        +---------+---------------+---------+-----------+----------+--------------+ PFV      Full                                                        +---------+---------------+---------+-----------+----------+--------------+ POP      Full           Yes      Yes                                 +---------+---------------+---------+-----------+----------+--------------+ PTV      Full                                                        +---------+---------------+---------+-----------+----------+--------------+  PERO     Full                                                        +---------+---------------+---------+-----------+----------+--------------+   +---------+---------------+---------+-----------+----------+--------------+ LEFT     CompressibilityPhasicitySpontaneityPropertiesThrombus Aging +---------+---------------+---------+-----------+----------+--------------+ CFV      Full           Yes      Yes                                 +---------+---------------+---------+-----------+----------+--------------+ SFJ      Full                                                        +---------+---------------+---------+-----------+----------+--------------+ FV Prox  Full                                                        +---------+---------------+---------+-----------+----------+--------------+ FV Mid   Full                                                        +---------+---------------+---------+-----------+----------+--------------+ FV DistalFull                                                         +---------+---------------+---------+-----------+----------+--------------+ PFV      Full                                                        +---------+---------------+---------+-----------+----------+--------------+ POP      Full           Yes      Yes                                 +---------+---------------+---------+-----------+----------+--------------+ PTV      Full                                                        +---------+---------------+---------+-----------+----------+--------------+ PERO     Full                                                        +---------+---------------+---------+-----------+----------+--------------+  Summary: RIGHT: - There is no evidence of deep vein thrombosis in the lower extremity.  - No cystic structure found in the popliteal fossa.  LEFT: - There is no evidence of deep vein thrombosis in the lower extremity.  - No cystic structure found in the popliteal fossa.  *See table(s) above for measurements and observations. Electronically signed by Jamelle Haring on 10/19/2021 at 12:09:04 PM.    Final    ECHOCARDIOGRAM COMPLETE  Result Date: 10/17/2021    ECHOCARDIOGRAM REPORT   Patient Name:   Joseph Powell Date of Exam: 10/17/2021 Medical Rec #:  [id number]         Height:       71.0 in Accession #:    [id number]        Weight:       187.0 lb Date of Birth:  May 29, 1955         BSA:          2.049 m Patient Age:    73 years          BP:           160/87 mmHg Patient Gender: M                 HR:           87 bpm. Exam Location:  Inpatient Procedure: 2D Echo, Cardiac Doppler and Color Doppler Indications:    Acute respiratory distress  History:        Patient has prior history of Echocardiogram examinations, most                 recent 11/29/2019. Risk Factors:Diabetes.  Sonographer:    Jefferey Pica Referring Phys: [phone] MURALI RAMASWAMY  Sonographer Comments: Image acquisition challenging due to respiratory  motion. IMPRESSIONS  1. Left ventricular ejection fraction, by estimation, is 60 to 65%. The left ventricle has normal function. The left ventricle has no regional wall motion abnormalities. There is mild concentric left ventricular hypertrophy. Left ventricular diastolic parameters were normal.  2. Right ventricular systolic function is low normal. The right ventricular size is mildly enlarged.  3. The mitral valve is normal in structure. No evidence of mitral valve regurgitation. No evidence of mitral stenosis.  4. The aortic valve is normal in structure. Aortic valve regurgitation is not visualized. No aortic stenosis is present.  5. The inferior vena cava is normal in size with greater than 50% respiratory variability, suggesting right atrial pressure of 3 mmHg. FINDINGS  Left Ventricle: Left ventricular ejection fraction, by estimation, is 60 to 65%. The left ventricle has normal function. The left ventricle has no regional wall motion abnormalities. The left ventricular internal cavity size was normal in size. There is  mild concentric left ventricular hypertrophy. Left ventricular diastolic parameters were normal. Right Ventricle: The right ventricular size is mildly enlarged. No increase in right ventricular wall thickness. Right ventricular systolic function is low normal. Left Atrium: Left atrial size was normal in size. Right Atrium: Right atrial size was normal in size. Pericardium: There is no evidence of pericardial effusion. Presence of epicardial fat layer. Mitral Valve: The mitral valve is normal in structure. No evidence of mitral valve regurgitation. No evidence of mitral valve stenosis. Tricuspid Valve: The tricuspid valve is normal in structure. Tricuspid valve regurgitation is not demonstrated. No evidence of tricuspid stenosis. Aortic Valve: The aortic valve is normal in structure. Aortic valve regurgitation is not visualized. No aortic stenosis is present. Aortic valve peak gradient  measures  8.5 mmHg. Pulmonic Valve: The pulmonic valve was normal in structure. Pulmonic valve regurgitation is not visualized. No evidence of pulmonic stenosis. Aorta: The aortic root is normal in size and structure. Venous: The inferior vena cava is normal in size with greater than 50% respiratory variability, suggesting right atrial pressure of 3 mmHg. IAS/Shunts: No atrial level shunt detected by color flow Doppler.  LEFT VENTRICLE PLAX 2D LVIDd:         4.20 cm   Diastology LVIDs:         2.30 cm   LV e' medial:    10.10 cm/s LV PW:         1.00 cm   LV E/e' medial:  5.8 LV IVS:        1.10 cm   LV e' lateral:   12.40 cm/s LVOT diam:     2.00 cm   LV E/e' lateral: 4.7 LV SV:         79 LV SV Index:   39 LVOT Area:     3.14 cm  RIGHT VENTRICLE RV Basal diam:  3.90 cm RV Mid diam:    4.30 cm LEFT ATRIUM             Index        RIGHT ATRIUM           Index LA diam:        4.90 cm 2.39 cm/m   RA Area:     16.50 cm LA Vol (A2C):   39.4 ml 19.23 ml/m  RA Volume:   42.30 ml  20.64 ml/m LA Vol (A4C):   50.3 ml 24.55 ml/m LA Biplane Vol: 44.8 ml 21.86 ml/m  AORTIC VALVE                 PULMONIC VALVE AV Area (Vmax): 2.71 cm     PV Vmax:       1.13 m/s AV Vmax:        146.00 cm/s  PV Peak grad:  5.1 mmHg AV Peak Grad:   8.5 mmHg LVOT Vmax:      126.00 cm/s LVOT Vmean:     77.400 cm/s LVOT VTI:       0.253 m  AORTA Ao Root diam: 3.60 cm Ao Asc diam:  3.50 cm MITRAL VALVE MV Area (PHT): 3.91 cm    SHUNTS MV Decel Time: 194 msec    Systemic VTI:  0.25 m MV E velocity: 58.20 cm/s  Systemic Diam: 2.00 cm MV A velocity: 78.10 cm/s MV E/A ratio:  0.75 Kardie Tobb DO Electronically signed by Berniece Salines DO Signature Date/Time: 10/17/2021/3:21:08 PM    Final    CT Angio Chest Pulmonary Embolism (PE) W or WO Contrast  Result Date: 10/17/2021 CLINICAL DATA:  Chest pain or SOB, pleurisy or effusion suspected EXAM: CT ANGIOGRAPHY CHEST WITH CONTRAST TECHNIQUE: Multidetector CT imaging of the chest was performed using the standard  protocol during bolus administration of intravenous contrast. Multiplanar CT image reconstructions and MIPs were obtained to evaluate the vascular anatomy. RADIATION DOSE REDUCTION: This exam was performed according to the departmental dose-optimization program which includes automated exposure control, adjustment of the mA and/or kV according to patient size and/or use of iterative reconstruction technique. CONTRAST:  70m OMNIPAQUE IOHEXOL 350 MG/ML SOLN COMPARISON:  Correlation is made with a chest x-ray dated October 16, 2021 FINDINGS: Cardiovascular: Satisfactory opacification of the pulmonary arteries to the segmental level. No evidence of pulmonary embolism. Normal  heart size. No pericardial effusion. Thoracic aorta has a normal appearance. Mild atheromatous calcifications of the arch of the aorta and coronary arteries. Mediastinum/Nodes: No enlarged mediastinal, hilar, or axillary lymph nodes. Thyroid gland, trachea, and esophagus demonstrate no significant findings. Moderate thickening of the distal esophagus. Lungs/Pleura: There are patchy confluent opacities seen at the bilateral mid and lower lung zones and are more prominent at the bilateral lower lobes. There is some consolidation seen at the left lung base. No pleural effusion. Upper Abdomen: No acute abnormality. Musculoskeletal: Mild-to-moderate thoracic spondylosis with prominent anterior marginal osteophytes. Review of the MIP images confirms the above findings. IMPRESSION: There are patchy confluent opacities seen at the bilateral mid and lower lung zones with an area of consolidation at the left lung base. These findings may represent pneumonia with superimposed atelectasis. No pleural effusion. Moderate circumferential thickening of the distal esophagus. Electronically Signed   By: Frazier Richards M.D.   On: 10/17/2021 10:41   DG Chest Port 1 View  Result Date: 10/16/2021 CLINICAL DATA:  Productive cough and shortness of breath over the last 2  weeks. EXAM: PORTABLE CHEST 1 VIEW COMPARISON:  07/02/2021 FINDINGS: Heart size is normal. Aortic atherosclerosis is noted. There is bronchial thickening and pulmonary scarring, but with patchy acute infiltrates in the mid and lower lungs bilaterally. No dense consolidation or lobar collapse. No effusion. No abnormal bone finding. IMPRESSION: Background pattern of scattered pulmonary scarring. Superimposed acute bronchial thickening and patchy pulmonary infiltrates in the mid and lower lungs consistent with bronchopneumonia. Electronically Signed   By: Nelson Chimes M.D.   On: 10/16/2021 08:28     Discharge Exam: Vitals:   10/24/21 0430 10/24/21 0855  BP:  (!) 145/84  Pulse:  82  Resp:    Temp: 98.2 F (36.8 C)   SpO2:     Vitals:   10/23/21 1941 10/23/21 2008 10/24/21 0430 10/24/21 0855  BP:    (!) 145/84  Pulse:    82  Resp:      Temp:  98 F (36.7 C) 98.2 F (36.8 C)   TempSrc:  Oral Oral   SpO2: 97%     Weight:      Height:        General: Pt is alert, awake, not in acute distress Cardiovascular: RRR, S1/S2 +, no rubs, no gallops Respiratory: Scattered rhonchi bilaterally, improved compared to yesterday. Abdominal: Soft, NT, ND, bowel sounds + Extremities: no edema, no cyanosis    The results of significant diagnostics from this hospitalization (including imaging, microbiology, ancillary and laboratory) are listed below for reference.     Microbiology: Recent Results (from the past 240 hour(s))  Culture, blood (Routine x 2)     Status: None   Collection Time: 10/16/21  8:13 AM   Specimen: BLOOD LEFT ARM  Result Value Ref Range Status   Specimen Description   Final    BLOOD LEFT ARM Performed at [facility], [address], [place], [place] [address]    Special Requests   Final    BOTTLES DRAWN AEROBIC AND ANAEROBIC Blood Culture adequate volume Performed at [facility], [address] [place], [place] [address]     Culture   Final    NO GROWTH 5 DAYS Performed at [facility], [place] [address] [place], [place] [address]    Report Status 10/21/2021 FINAL  Final  Culture, blood (Routine x 2)     Status: Abnormal (Preliminary result)   Collection Time: 10/16/21  8:13 AM   Specimen: BLOOD RIGHT ARM  Result Value Ref Range Status   Specimen Description   Final    BLOOD RIGHT ARM Performed at [facility], [address] [place], [place] [address]    Special Requests   Final    BOTTLES DRAWN AEROBIC AND ANAEROBIC Blood Culture adequate volume Performed at [facility], [address], [place], [place] [address]    Culture  Setup Time   Final    GRAM NEGATIVE RODS ANAEROBIC BOTTLE ONLY CRITICAL RESULT CALLED TO, READ BACK BY AND VERIFIED WITH: PHARMD ELLEN JACKSON 10/18/21'@2'$ :30 BY TW    Culture (A)  Final    HAEMOPHILUS INFLUENZAE BETA LACTAMASE NEGATIVE HEALTH DEPARTMENT NOTIFIED Referred to [facility] in [place], [place] for serotyping. Performed at [facility], [place] [address], [place], [place] [address]    Report Status PENDING  Incomplete  Blood Culture ID Panel (Reflexed)     Status: Abnormal   Collection Time: 10/16/21  8:13 AM  Result Value Ref Range Status   Enterococcus faecalis NOT DETECTED NOT DETECTED Final   Enterococcus Faecium NOT DETECTED NOT DETECTED Final   Listeria monocytogenes NOT DETECTED NOT DETECTED Final   Staphylococcus species NOT DETECTED NOT DETECTED Final   Staphylococcus aureus (BCID) NOT DETECTED NOT DETECTED Final   Staphylococcus epidermidis NOT DETECTED NOT DETECTED Final   Staphylococcus lugdunensis NOT DETECTED NOT DETECTED Final   Streptococcus species NOT DETECTED NOT DETECTED Final   Streptococcus agalactiae NOT DETECTED NOT DETECTED Final   Streptococcus pneumoniae NOT DETECTED NOT DETECTED Final   Streptococcus pyogenes NOT DETECTED NOT DETECTED Final   A.calcoaceticus-baumannii NOT DETECTED  NOT DETECTED Final   Bacteroides fragilis NOT DETECTED NOT DETECTED Final   Enterobacterales NOT DETECTED NOT DETECTED Final   Enterobacter cloacae complex NOT DETECTED NOT DETECTED Final   Escherichia coli NOT DETECTED NOT DETECTED Final   Klebsiella aerogenes NOT DETECTED NOT DETECTED Final   Klebsiella oxytoca NOT DETECTED NOT DETECTED Final   Klebsiella pneumoniae NOT DETECTED NOT DETECTED Final   Proteus species NOT DETECTED NOT DETECTED Final   Salmonella species NOT DETECTED NOT DETECTED Final   Serratia marcescens NOT DETECTED NOT DETECTED Final   Haemophilus influenzae DETECTED (A) NOT DETECTED Final    Comment: CRITICAL RESULT CALLED TO, READ BACK BY AND VERIFIED WITH: PHARMD ELLEN JACKSON 10/18/21'@2'$ :30 BY TW    Neisseria meningitidis NOT DETECTED NOT DETECTED Final   Pseudomonas aeruginosa NOT DETECTED NOT DETECTED Final   Stenotrophomonas maltophilia NOT DETECTED NOT DETECTED Final   Candida albicans NOT DETECTED NOT DETECTED Final   Candida auris NOT DETECTED NOT DETECTED Final   Candida glabrata NOT DETECTED NOT DETECTED Final   Candida krusei NOT DETECTED NOT DETECTED Final   Candida parapsilosis NOT DETECTED NOT DETECTED Final   Candida tropicalis NOT DETECTED NOT DETECTED Final   Cryptococcus neoformans/gattii NOT DETECTED NOT DETECTED Final    Comment: Performed at [facility], [address], [place], [place] [address]  Resp Panel by RT-PCR (Flu A&B, Covid) Anterior Nasal Swab     Status: None   Collection Time: 10/16/21  8:55 AM   Specimen: Anterior Nasal Swab  Result Value Ref Range Status   SARS Coronavirus 2 by RT PCR NEGATIVE NEGATIVE Final    Comment: (NOTE) SARS-CoV-2 target nucleic acids are NOT DETECTED.  The SARS-CoV-2 RNA is generally detectable in upper respiratory specimens during the acute phase of infection. The lowest concentration of SARS-CoV-2 viral  copies this assay can detect is 138 copies/mL. A negative result does not preclude  SARS-Cov-2 infection and should not be used as the sole basis for treatment or other patient management decisions. A negative result may occur with  improper specimen collection/handling, submission of specimen other than nasopharyngeal swab, presence of viral mutation(s) within the areas targeted by this assay, and inadequate number of viral copies(<138 copies/mL). A negative result must be combined with clinical observations, patient history, and epidemiological information. The expected result is Negative.  Fact Sheet for Patients:  EntrepreneurPulse.com.au  Fact Sheet for Healthcare Providers:  IncredibleEmployment.be  This test is no t yet approved or cleared by the [place] FDA and  has been authorized for detection and/or diagnosis of SARS-CoV-2 by FDA under an Emergency Use Authorization (EUA). This EUA will remain  in effect (meaning this test can be used) for the duration of the COVID-19 declaration under Section 564(b)(1) of the Act, 21 U.S.C.section 360bbb-3(b)(1), unless the authorization is terminated  or revoked sooner.       Influenza A by PCR NEGATIVE NEGATIVE Final   Influenza B by PCR NEGATIVE NEGATIVE Final    Comment: (NOTE) The Xpert Xpress SARS-CoV-2/FLU/RSV plus assay is intended as an aid in the diagnosis of influenza from Nasopharyngeal swab specimens and should not be used as a sole basis for treatment. Nasal washings and aspirates are unacceptable for Xpert Xpress SARS-CoV-2/FLU/RSV testing.  Fact Sheet for Patients: EntrepreneurPulse.com.au  Fact Sheet for Healthcare Providers: IncredibleEmployment.be  This test is not yet approved or cleared by the [place] FDA and has been authorized for detection and/or diagnosis of SARS-CoV-2 by FDA under an Emergency Use Authorization (EUA). This EUA will remain in effect (meaning this test can be used) for the duration of  the COVID-19 declaration under Section 564(b)(1) of the Act, 21 U.S.C. section 360bbb-3(b)(1), unless the authorization is terminated or revoked.  Performed at [facility], [address] [place], [place] [address]   Respiratory (~20 pathogens) panel by PCR     Status: Abnormal   Collection Time: 10/16/21 10:50 AM   Specimen: Nasopharyngeal Swab; Respiratory  Result Value Ref Range Status   Adenovirus NOT DETECTED NOT DETECTED Final   Coronavirus 229E NOT DETECTED NOT DETECTED Final    Comment: (NOTE) The Coronavirus on the Respiratory Panel, DOES NOT test for the novel  Coronavirus (2019 nCoV)    Coronavirus HKU1 NOT DETECTED NOT DETECTED Final   Coronavirus NL63 NOT DETECTED NOT DETECTED Final   Coronavirus OC43 NOT DETECTED NOT DETECTED Final   Metapneumovirus NOT DETECTED NOT DETECTED Final   Rhinovirus / Enterovirus NOT DETECTED NOT DETECTED Final   Influenza A NOT DETECTED NOT DETECTED Final   Influenza B NOT DETECTED NOT DETECTED Final   Parainfluenza Virus 1 NOT DETECTED NOT DETECTED Final   Parainfluenza Virus 2 NOT DETECTED NOT DETECTED Final   Parainfluenza Virus 3 DETECTED (A) NOT DETECTED Final   Parainfluenza Virus 4 NOT DETECTED NOT DETECTED Final   Respiratory Syncytial Virus NOT DETECTED NOT DETECTED Final   Bordetella pertussis NOT DETECTED NOT DETECTED Final   Bordetella Parapertussis NOT DETECTED NOT DETECTED Final   Chlamydophila pneumoniae NOT DETECTED NOT DETECTED Final   Mycoplasma pneumoniae NOT DETECTED NOT DETECTED Final    Comment: Performed at [facility], [address], [place], [place] [address]  Urine Culture     Status: None   Collection Time: 10/16/21 11:20 AM   Specimen: In/Out Cath Urine  Result  Value Ref Range Status   Specimen Description   Final    IN/OUT CATH URINE Performed at [facility], [address], [place], [place] [address]    Special Requests   Final    NONE Performed at  [facility], [address], [place], [place] [address]    Culture   Final    NO GROWTH Performed at [facility], [place] [address], [place], [place] [address]    Report Status 10/17/2021 FINAL  Final  Expectorated Sputum Assessment w Gram Stain, Rflx to Resp Cult     Status: None   Collection Time: 10/17/21  4:31 AM   Specimen: Sputum  Result Value Ref Range Status   Specimen Description SPUTUM  Final   Special Requests NONE  Final   Sputum evaluation   Final    THIS SPECIMEN IS ACCEPTABLE FOR SPUTUM CULTURE Performed at [facility], [place] [address], [place], [place] [address]    Report Status 10/17/2021 FINAL  Final  Culture, Respiratory w Gram Stain     Status: None   Collection Time: 10/17/21  4:31 AM   Specimen: SPU  Result Value Ref Range Status   Specimen Description   Final    SPUTUM Performed at [facility] [address] [place], [place] [address]    Special Requests   Final    NONE Reflexed from [id number] Performed at [facility], [place] [address], [place], [place] [address]    Gram Stain   Final    ABUNDANT WBC PRESENT, PREDOMINANTLY PMN FEW GRAM POSITIVE COCCI FEW GRAM NEGATIVE RODS FEW GRAM POSITIVE RODS    Culture   Final    RARE Normal respiratory flora-no Staph aureus or Pseudomonas seen Performed at [facility], [address] [place], [place] [address]    Report Status 10/19/2021 FINAL  Final  MRSA Next Gen by PCR, Nasal     Status: None   Collection Time: 10/17/21  5:35 PM   Specimen: Nasal Mucosa; Nasal Swab  Result Value Ref Range Status   MRSA by PCR Next Gen NOT DETECTED NOT DETECTED Final    Comment: (NOTE) The GeneXpert MRSA Assay (FDA approved for NASAL specimens only), is one component of a comprehensive MRSA colonization surveillance program. It is not intended to diagnose MRSA infection nor to guide or monitor treatment for MRSA infections. Test  performance is not FDA approved in patients less than 12 years old. Performed at [facility], [place] [address], [place],  [address]      Labs: BNP (last 3 results) Recent Labs    10/17/21 0507  BNP 149.7*   Basic Metabolic Panel: Recent Labs  Lab 10/18/21 0735 10/19/21 0718 10/20/21 0527 10/21/21 0415 10/22/21 0446 10/23/21 0546 10/24/21 0725  NA 141 141 139 140 139 137 137  K 3.4* 4.0 3.5 3.4* 3.6 3.3* 3.2*  CL 102 103 101 99 100 99 99  CO2 '29 29 29 '$ 32 '31 31 31  '$ GLUCOSE 229* 138* 155* 116* 109* 118* 86  BUN '10 12 15 12 12 12 13  '$ CREATININE 0.66 0.70 0.66 0.64 0.70 0.62 0.74  CALCIUM 8.8* 9.2 9.0 8.7* 8.6* 8.4* 8.7*  MG 2.2 2.2  --   --   --   --   --   PHOS 3.0  --   --   --   --   --   --    Liver  Function Tests: No results for input(s): "AST", "ALT", "ALKPHOS", "BILITOT", "PROT", "ALBUMIN" in the last 168 hours. No results for input(s): "LIPASE", "AMYLASE" in the last 168 hours. No results for input(s): "AMMONIA" in the last 168 hours. CBC: Recent Labs  Lab 10/18/21 0735 10/19/21 0718 10/20/21 0527 10/21/21 0415 10/22/21 0446 10/23/21 0546 10/24/21 0725  WBC 13.0*   < > 18.3* 16.7* 16.5* 15.9* 15.1*  NEUTROABS 10.1*  --  13.7*  --   --   --  10.1*  HGB 13.4   < > 13.3 13.4 13.6 13.0 13.3  HCT 40.4   < > 40.4 39.5 40.4 38.2* 40.1  MCV 96.4   < > 96.2 94.5 94.4 92.5 95.2  PLT 159   < > 150 144* 131* 130* 131*   < > = values in this interval not displayed.   Cardiac Enzymes: No results for input(s): "CKTOTAL", "CKMB", "CKMBINDEX", "TROPONINI" in the last 168 hours. BNP: Invalid input(s): "POCBNP" CBG: Recent Labs  Lab 10/23/21 2009 10/24/21 0016 10/24/21 0402 10/24/21 0730 10/24/21 1152  GLUCAP 226* 143* 80 76 149*   D-Dimer No results for input(s): "DDIMER" in the last 72 hours. Hgb A1c No results for input(s): "HGBA1C" in the last 72 hours. Lipid Profile No results for input(s): "CHOL", "HDL", "LDLCALC", "TRIG",  "CHOLHDL", "LDLDIRECT" in the last 72 hours. Thyroid function studies No results for input(s): "TSH", "T4TOTAL", "T3FREE", "THYROIDAB" in the last 72 hours.  Invalid input(s): "FREET3" Anemia work up No results for input(s): "VITAMINB12", "FOLATE", "FERRITIN", "TIBC", "IRON", "RETICCTPCT" in the last 72 hours. Urinalysis    Component Value Date/Time   COLORURINE YELLOW 10/16/2021 1120   APPEARANCEUR CLEAR 10/16/2021 1120   LABSPEC 1.009 10/16/2021 1120   LABSPEC 1.025 02/13/2017 1539   PHURINE 6.5 10/16/2021 1120   GLUCOSEU NEGATIVE 10/16/2021 1120   GLUCOSEU NEGATIVE 03/05/2021 0835   GLUCOSEU Negative 02/13/2017 1539   HGBUR MODERATE (A) 10/16/2021 1120   BILIRUBINUR NEGATIVE 10/16/2021 1120   BILIRUBINUR Negative 02/13/2017 1539   KETONESUR 15 (A) 10/16/2021 1120   PROTEINUR TRACE (A) 10/16/2021 1120   UROBILINOGEN 0.2 03/05/2021 0835   UROBILINOGEN 0.2 02/13/2017 1539   NITRITE NEGATIVE 10/16/2021 1120   LEUKOCYTESUR NEGATIVE 10/16/2021 1120   LEUKOCYTESUR Negative 02/13/2017 1539   Sepsis Labs Recent Labs  Lab 10/21/21 0415 10/22/21 0446 10/23/21 0546 10/24/21 0725  WBC 16.7* 16.5* 15.9* 15.1*   Microbiology Recent Results (from the past 240 hour(s))  Culture, blood (Routine x 2)     Status: None   Collection Time: 10/16/21  8:13 AM   Specimen: BLOOD LEFT ARM  Result Value Ref Range Status   Specimen Description   Final    BLOOD LEFT ARM Performed at [facility], [address] [place], [place] [address]    Special Requests   Final    BOTTLES DRAWN AEROBIC AND ANAEROBIC Blood Culture adequate volume Performed at [facility], [address] [place], [place] [address]    Culture   Final    NO GROWTH 5 DAYS Performed at [facility], [place] [address], [place], [place] [address]    Report Status 10/21/2021 FINAL  Final  Culture, blood (Routine x 2)     Status: Abnormal (Preliminary result)   Collection Time:  10/16/21  8:13 AM   Specimen: BLOOD RIGHT ARM  Result Value Ref Range Status   Specimen Description   Final    BLOOD RIGHT ARM Performed at [facility], [place]  [place], , [place] [address]    Special Requests   Final    BOTTLES DRAWN AEROBIC AND ANAEROBIC Blood Culture adequate volume Performed at [facility], [address] [place], [place] [address]    Culture  Setup Time   Final    GRAM NEGATIVE RODS ANAEROBIC BOTTLE ONLY CRITICAL RESULT CALLED TO, READ BACK BY AND VERIFIED WITH: PHARMD ELLEN JACKSON 10/18/21'@2'$ :30 BY TW    Culture (A)  Final    HAEMOPHILUS INFLUENZAE BETA LACTAMASE NEGATIVE HEALTH DEPARTMENT NOTIFIED Referred to [facility] in [place], [place] for serotyping. Performed at [facility], [place] [address], [place], [place] [address]    Report Status PENDING  Incomplete  Blood Culture ID Panel (Reflexed)     Status: Abnormal   Collection Time: 10/16/21  8:13 AM  Result Value Ref Range Status   Enterococcus faecalis NOT DETECTED NOT DETECTED Final   Enterococcus Faecium NOT DETECTED NOT DETECTED Final   Listeria monocytogenes NOT DETECTED NOT DETECTED Final   Staphylococcus species NOT DETECTED NOT DETECTED Final   Staphylococcus aureus (BCID) NOT DETECTED NOT DETECTED Final   Staphylococcus epidermidis NOT DETECTED NOT DETECTED Final   Staphylococcus lugdunensis NOT DETECTED NOT DETECTED Final   Streptococcus species NOT DETECTED NOT DETECTED Final   Streptococcus agalactiae NOT DETECTED NOT DETECTED Final   Streptococcus pneumoniae NOT DETECTED NOT DETECTED Final   Streptococcus pyogenes NOT DETECTED NOT DETECTED Final   A.calcoaceticus-baumannii NOT DETECTED NOT DETECTED Final   Bacteroides fragilis NOT DETECTED NOT DETECTED Final   Enterobacterales NOT DETECTED NOT DETECTED Final   Enterobacter cloacae complex NOT DETECTED NOT DETECTED Final   Escherichia coli NOT DETECTED NOT DETECTED Final    Klebsiella aerogenes NOT DETECTED NOT DETECTED Final   Klebsiella oxytoca NOT DETECTED NOT DETECTED Final   Klebsiella pneumoniae NOT DETECTED NOT DETECTED Final   Proteus species NOT DETECTED NOT DETECTED Final   Salmonella species NOT DETECTED NOT DETECTED Final   Serratia marcescens NOT DETECTED NOT DETECTED Final   Haemophilus influenzae DETECTED (A) NOT DETECTED Final    Comment: CRITICAL RESULT CALLED TO, READ BACK BY AND VERIFIED WITH: PHARMD ELLEN JACKSON 10/18/21'@2'$ :30 BY TW    Neisseria meningitidis NOT DETECTED NOT DETECTED Final   Pseudomonas aeruginosa NOT DETECTED NOT DETECTED Final   Stenotrophomonas maltophilia NOT DETECTED NOT DETECTED Final   Candida albicans NOT DETECTED NOT DETECTED Final   Candida auris NOT DETECTED NOT DETECTED Final   Candida glabrata NOT DETECTED NOT DETECTED Final   Candida krusei NOT DETECTED NOT DETECTED Final   Candida parapsilosis NOT DETECTED NOT DETECTED Final   Candida tropicalis NOT DETECTED NOT DETECTED Final   Cryptococcus neoformans/gattii NOT DETECTED NOT DETECTED Final    Comment: Performed at [facility], [address], [place], [place] [address]  Resp Panel by RT-PCR (Flu A&B, Covid) Anterior Nasal Swab     Status: None   Collection Time: 10/16/21  8:55 AM   Specimen: Anterior Nasal Swab  Result Value Ref Range Status   SARS Coronavirus 2 by RT PCR NEGATIVE NEGATIVE Final    Comment: (NOTE) SARS-CoV-2 target nucleic acids are NOT DETECTED.  The SARS-CoV-2 RNA is generally detectable in upper respiratory specimens during the acute phase of infection. The lowest concentration of SARS-CoV-2 viral copies this assay can detect is 138 copies/mL. A negative result does not preclude SARS-Cov-2 infection and should not be used as the sole basis for treatment or other patient management decisions. A negative result  may occur with  improper specimen collection/handling, submission of specimen other than nasopharyngeal swab,  presence of viral mutation(s) within the areas targeted by this assay, and inadequate number of viral copies(<138 copies/mL). A negative result must be combined with clinical observations, patient history, and epidemiological information. The expected result is Negative.  Fact Sheet for Patients:  EntrepreneurPulse.com.au  Fact Sheet for Healthcare Providers:  IncredibleEmployment.be  This test is no t yet approved or cleared by the [place] FDA and  has been authorized for detection and/or diagnosis of SARS-CoV-2 by FDA under an Emergency Use Authorization (EUA). This EUA will remain  in effect (meaning this test can be used) for the duration of the COVID-19 declaration under Section 564(b)(1) of the Act, 21 U.S.C.section 360bbb-3(b)(1), unless the authorization is terminated  or revoked sooner.       Influenza A by PCR NEGATIVE NEGATIVE Final   Influenza B by PCR NEGATIVE NEGATIVE Final    Comment: (NOTE) The Xpert Xpress SARS-CoV-2/FLU/RSV plus assay is intended as an aid in the diagnosis of influenza from Nasopharyngeal swab specimens and should not be used as a sole basis for treatment. Nasal washings and aspirates are unacceptable for Xpert Xpress SARS-CoV-2/FLU/RSV testing.  Fact Sheet for Patients: EntrepreneurPulse.com.au  Fact Sheet for Healthcare Providers: IncredibleEmployment.be  This test is not yet approved or cleared by the [place] FDA and has been authorized for detection and/or diagnosis of SARS-CoV-2 by FDA under an Emergency Use Authorization (EUA). This EUA will remain in effect (meaning this test can be used) for the duration of the COVID-19 declaration under Section 564(b)(1) of the Act, 21 U.S.C. section 360bbb-3(b)(1), unless the authorization is terminated or revoked.  Performed at [facility], [address] [place], [place] [address]    Respiratory (~20 pathogens) panel by PCR     Status: Abnormal   Collection Time: 10/16/21 10:50 AM   Specimen: Nasopharyngeal Swab; Respiratory  Result Value Ref Range Status   Adenovirus NOT DETECTED NOT DETECTED Final   Coronavirus 229E NOT DETECTED NOT DETECTED Final    Comment: (NOTE) The Coronavirus on the Respiratory Panel, DOES NOT test for the novel  Coronavirus (2019 nCoV)    Coronavirus HKU1 NOT DETECTED NOT DETECTED Final   Coronavirus NL63 NOT DETECTED NOT DETECTED Final   Coronavirus OC43 NOT DETECTED NOT DETECTED Final   Metapneumovirus NOT DETECTED NOT DETECTED Final   Rhinovirus / Enterovirus NOT DETECTED NOT DETECTED Final   Influenza A NOT DETECTED NOT DETECTED Final   Influenza B NOT DETECTED NOT DETECTED Final   Parainfluenza Virus 1 NOT DETECTED NOT DETECTED Final   Parainfluenza Virus 2 NOT DETECTED NOT DETECTED Final   Parainfluenza Virus 3 DETECTED (A) NOT DETECTED Final   Parainfluenza Virus 4 NOT DETECTED NOT DETECTED Final   Respiratory Syncytial Virus NOT DETECTED NOT DETECTED Final   Bordetella pertussis NOT DETECTED NOT DETECTED Final   Bordetella Parapertussis NOT DETECTED NOT DETECTED Final   Chlamydophila pneumoniae NOT DETECTED NOT DETECTED Final   Mycoplasma pneumoniae NOT DETECTED NOT DETECTED Final    Comment: Performed at [facility], [address], [place], [place] [address]  Urine Culture     Status: None   Collection Time: 10/16/21 11:20 AM   Specimen: In/Out Cath Urine  Result Value Ref Range Status   Specimen Description   Final    IN/OUT CATH URINE Performed at [facility], [address], [place], [place] [address]    Special Requests  Final    NONE Performed at [facility], [address] [place], [place] [address]    Culture   Final    NO GROWTH Performed at [facility], [place] [address] [place], [place] [address]    Report Status 10/17/2021 FINAL  Final  Expectorated  Sputum Assessment w Gram Stain, Rflx to Resp Cult     Status: None   Collection Time: 10/17/21  4:31 AM   Specimen: Sputum  Result Value Ref Range Status   Specimen Description SPUTUM  Final   Special Requests NONE  Final   Sputum evaluation   Final    THIS SPECIMEN IS ACCEPTABLE FOR SPUTUM CULTURE Performed at [facility], [place] [address], [place], [place] [address]    Report Status 10/17/2021 FINAL  Final  Culture, Respiratory w Gram Stain     Status: None   Collection Time: 10/17/21  4:31 AM   Specimen: SPU  Result Value Ref Range Status   Specimen Description   Final    SPUTUM Performed at [place] [address], [place], [place] [address]    Special Requests   Final    NONE Reflexed from [id number] Performed at [facility], [place] [address] [place], [place] [address]    Gram Stain   Final    ABUNDANT WBC PRESENT, PREDOMINANTLY PMN FEW GRAM POSITIVE COCCI FEW GRAM NEGATIVE RODS FEW GRAM POSITIVE RODS    Culture   Final    RARE Normal respiratory flora-no Staph aureus or Pseudomonas seen Performed at [facility], [address], [place], [place] [address]    Report Status 10/19/2021 FINAL  Final  MRSA Next Gen by PCR, Nasal     Status: None   Collection Time: 10/17/21  5:35 PM   Specimen: Nasal Mucosa; Nasal Swab  Result Value Ref Range Status   MRSA by PCR Next Gen NOT DETECTED NOT DETECTED Final    Comment: (NOTE) The GeneXpert MRSA Assay (FDA approved for NASAL specimens only), is one component of a comprehensive MRSA colonization surveillance program. It is not intended to diagnose MRSA infection nor to guide or monitor treatment for MRSA infections. Test performance is not FDA approved in patients less than 71 years old. Performed at [facility], [place] [address] [place],  [address]      Time coordinating discharge: Over 30 minutes  SIGNED:   Darliss Cheney,  MD  [facility] 10/24/2021, 1:05 PM *Please note that this is a verbal dictation therefore any spelling or grammatical errors are due to the "[employer] One" system interpretation. If 7PM-7AM, please contact night-coverage [email or web address]

## 2021-10-24 NOTE — Progress Notes (Addendum)
SATURATION QUALIFICATIONS: (This note is used to comply with regulatory documentation for home oxygen)  Patient Saturations on Room Air at Rest = 90  Patient Saturations on Room Air while Ambulating = 85%  Patient Saturations on 2 Liters of oxygen while Ambulating = 93%  Please briefly explain why patient needs home oxygen:  Pt desaturates into the 80's with exertion, coughing, climbing stairs.  Pt required 4 L O2 briefly after stairs.

## 2021-10-26 LAB — CULTURE, BLOOD (ROUTINE X 2): Special Requests: ADEQUATE

## 2021-10-30 DIAGNOSIS — H90A31 Mixed conductive and sensorineural hearing loss, unilateral, right ear with restricted hearing on the contralateral side: Secondary | ICD-10-CM | POA: Diagnosis not present

## 2021-10-31 ENCOUNTER — Telehealth: Payer: Self-pay | Admitting: Primary Care

## 2021-10-31 ENCOUNTER — Other Ambulatory Visit: Payer: BC Managed Care – PPO

## 2021-10-31 ENCOUNTER — Ambulatory Visit (INDEPENDENT_AMBULATORY_CARE_PROVIDER_SITE_OTHER): Payer: BC Managed Care – PPO

## 2021-10-31 ENCOUNTER — Ambulatory Visit (INDEPENDENT_AMBULATORY_CARE_PROVIDER_SITE_OTHER): Payer: BC Managed Care – PPO | Admitting: Primary Care

## 2021-10-31 ENCOUNTER — Encounter: Payer: Self-pay | Admitting: Primary Care

## 2021-10-31 ENCOUNTER — Other Ambulatory Visit (INDEPENDENT_AMBULATORY_CARE_PROVIDER_SITE_OTHER): Payer: BC Managed Care – PPO

## 2021-10-31 VITALS — BP 104/62 | HR 60 | Temp 98.8°F | Ht 71.0 in | Wt 179.4 lb

## 2021-10-31 DIAGNOSIS — R030 Elevated blood-pressure reading, without diagnosis of hypertension: Secondary | ICD-10-CM | POA: Diagnosis not present

## 2021-10-31 DIAGNOSIS — D649 Anemia, unspecified: Secondary | ICD-10-CM | POA: Diagnosis not present

## 2021-10-31 DIAGNOSIS — J9601 Acute respiratory failure with hypoxia: Secondary | ICD-10-CM

## 2021-10-31 DIAGNOSIS — J189 Pneumonia, unspecified organism: Secondary | ICD-10-CM

## 2021-10-31 DIAGNOSIS — R059 Cough, unspecified: Secondary | ICD-10-CM | POA: Diagnosis not present

## 2021-10-31 DIAGNOSIS — D72821 Monocytosis (symptomatic): Secondary | ICD-10-CM | POA: Diagnosis not present

## 2021-10-31 DIAGNOSIS — D72829 Elevated white blood cell count, unspecified: Secondary | ICD-10-CM | POA: Diagnosis not present

## 2021-10-31 LAB — BASIC METABOLIC PANEL
BUN: 16 mg/dL (ref 6–23)
CO2: 24 mEq/L (ref 19–32)
Calcium: 9 mg/dL (ref 8.4–10.5)
Chloride: 92 mEq/L — ABNORMAL LOW (ref 96–112)
Creatinine, Ser: 1.15 mg/dL (ref 0.40–1.50)
GFR: 66.54 mL/min (ref >60.00)
Glucose, Bld: 310 mg/dL — ABNORMAL HIGH (ref 70–99)
Potassium: 3.4 mEq/L — ABNORMAL LOW (ref 3.5–5.1)
Sodium: 131 mEq/L — ABNORMAL LOW (ref 135–145)

## 2021-10-31 LAB — CBC WITH DIFFERENTIAL/PLATELET
Basophils Absolute: 0.1 10*3/uL (ref 0.0–0.1)
Basophils Relative: 0.2 % (ref 0.0–3.0)
Eosinophils Absolute: 0 10*3/uL (ref 0.0–0.7)
Eosinophils Relative: 0 % (ref 0.0–5.0)
HCT: 38.8 % — ABNORMAL LOW (ref 39.0–52.0)
Hemoglobin: 12.7 g/dL — ABNORMAL LOW (ref 13.0–17.0)
Lymphocytes Relative: 1.5 % — ABNORMAL LOW (ref 12.0–46.0)
Lymphs Abs: 0.6 10*3/uL — ABNORMAL LOW (ref 0.7–4.0)
MCHC: 32.8 g/dL (ref 30.0–36.0)
MCV: 94.2 fl (ref 78.0–100.0)
Monocytes Absolute: 6.6 10*3/uL — ABNORMAL HIGH (ref 0.1–1.0)
Monocytes Relative: 16 % — ABNORMAL HIGH (ref 3.0–12.0)
Neutro Abs: 33.9 10*3/uL — ABNORMAL HIGH (ref 1.4–7.7)
Neutrophils Relative %: 82.3 % — ABNORMAL HIGH (ref 43.0–77.0)
Platelets: 90 10*3/uL — ABNORMAL LOW (ref 150.0–400.0)
RBC: 4.12 Mil/uL — ABNORMAL LOW (ref 4.22–5.81)
RDW: 14.7 % (ref 11.5–15.5)
WBC: 41.2 10*3/uL (ref 4.0–10.5)

## 2021-10-31 LAB — BRAIN NATRIURETIC PEPTIDE: Pro B Natriuretic peptide (BNP): 120 pg/mL — ABNORMAL HIGH (ref 0.0–100.0)

## 2021-10-31 MED ORDER — LEVOFLOXACIN 500 MG PO TABS: 500.0000 mg | ORAL_TABLET | Freq: Every day | ORAL | 0 refills | Status: DC

## 2021-10-31 MED ORDER — PREDNISONE 10 MG PO TABS: ORAL_TABLET | ORAL | 0 refills | Status: DC

## 2021-10-31 NOTE — Assessment & Plan Note (Addendum)
-   Patient continues to require 3 L supplemental oxygen

## 2021-10-31 NOTE — Progress Notes (Signed)
$'@Patient'E$  ID: Joseph Powell, male    DOB: 1956/03/12, 66 y.o.   MRN: [id number]  Chief Complaint  Patient presents with   Hospitalization Follow-up    Cough with dark yellow mucous.  Coughs more with exertion.      Referring provider: Faustino Congress, NP  HPI: 66 year old male, never smoked.  Past medical history significant for OSA, multifocal pneumonia, cough variant asthma, acute respiratory failure with hypoxia, type 2 diabetes.  Patient of Dr. Melvyn Novas, last seen by pulmonary nurse practitioner on 08/19/2021.  10/31/2021 Patient presents today for hospital follow-up.  Patient was admitted from 10/16/2021 - 10/24/2021 for acute hypoxic respiratory failure secondary to bilateral pneumonia from parainfluenza.  He was on cruise in [place] when he got sick. Chest x-ray showed background pattern of scattered pulmonary scarring, superimposed acute bronchial thickening and patchy pulmonary infiltrates in the mid to lower lungs consistent with bronchopneumonia.  Blood cultures positive for haemophilus influenzae PNA.  Sputum culture rare respiratory flora, Legionella antigen negative, strep pneumonia negative.  ID consulted.  He was started on Rocephin which was transitioned to Augmentin.  Patient completed 7-day course of antibiotics per ID.  He initially required high flow nasal cannula oxygen 25 L 50% on admission.  Treated with BiPAP during hospitalization.  Echocardiogram showed normal EF, low normal RV function in the setting of pneumonia.  He received 1 dose of Lasix.  Chest x-ray showed improvement. Patient was discharged on oxygen.  Accompanied by his wife. He was doing better until recently.He has been coughing a lot more the last day or two. Cough is mainly dry, only productive in the morning. Sputum is slight yellow color. He feels short of breath when he is off oxygen. He is on 3L oxygen, desaturates when coughing. He is taking tesslon perles and delsym for cough. He has lost some weight  since his vacation. He is eating and drinking fluifs.  He completed 5 day course of prednisone.    No Known Allergies  Immunization History  Administered Date(s) Administered   DTaP / IPV 02/18/2018, 05/11/2018, 09/09/2018   Fluad Quad(high Dose 65+) 01/31/2021   Hepatitis B, ped/adol 02/18/2018, 05/11/2018, 12/09/2018   HiB (PRP-T) 02/18/2018, 05/11/2018, 09/09/2018   Influenza Split 04/01/2011   Influenza, High Dose Seasonal PF 04/05/2018, 04/07/2019   Influenza,inj,quad, With Preservative 02/03/2019   Janssen (J&J) SARS-COV-2 Vaccination 08/11/2019   MMR 09/09/2019   Pneumococcal Conjugate-13 02/18/2018, 05/11/2018, 09/09/2018   Pneumococcal Polysaccharide-23 05/05/2010, 12/09/2018   Tdap 08/25/2013, 09/09/2018   Zoster Recombinat (Shingrix) 02/12/2017, 02/07/2019, 04/04/2019   Zoster, Live 01/23/2017, 01/18/2019, 04/04/2019    Past Medical History:  Diagnosis Date   Allergic rhinitis    Asthma    slight   Bell's palsy    GERD (gastroesophageal reflux disease)    Gout    Hypercholesteremia    Melanoma ([place])    Osteoarthritis    Sleep apnea    Resolving   Thrombocytopenia (HCC)    Tubular adenoma     Tobacco History: Social History   Tobacco Use  Smoking Status Never   Passive exposure: Past  Smokeless Tobacco Never   Counseling given: Not Answered   Outpatient Medications Prior to Visit  Medication Sig Dispense Refill   allopurinol (ZYLOPRIM) 300 MG tablet 1 tablet     amLODipine (NORVASC) 5 MG tablet Take 1 tablet (5 mg total) by mouth daily. 30 tablet 0   benzonatate (TESSALON) 200 MG capsule Take 1 capsule (200 mg total) by mouth 3 (three) times  daily as needed for cough. 60 capsule 3   CALCIUM PO Take by mouth.     carvedilol (COREG) 3.125 MG tablet Take 1 tablet (3.125 mg total) by mouth 2 (two) times daily with a meal. PATIENT MUST SCHEDULE ANNUAL APPOINTMENT FOR FUTURE REFILLS. FIRST ATTEMPT 30 tablet 0   chlorpheniramine (CHLOR-TRIMETON) 4 MG  tablet Take 4 mg by mouth 2 (two) times daily as needed for allergies.     Cyanocobalamin (VITAMIN B-12 PO) Take by mouth.     Vitamin D, Ergocalciferol, (DRISDOL) 1.25 MG (50000 UNIT) CAPS capsule Take 1 capsule by mouth once a week 12 capsule 0   No facility-administered medications prior to visit.   Review of Systems  Review of Systems  Constitutional: Negative.   HENT:  Positive for congestion.   Respiratory:  Positive for cough and shortness of breath.    Physical Exam  BP 104/62 (BP Location: Right Arm, Patient Position: Sitting, Cuff Size: Normal)   Pulse 60   Temp 98.8 F (37.1 C) (Oral)   Ht $R'5\' 11"'EX$  (1.803 m)   Wt 179 lb 6.4 oz (81.4 kg)   SpO2 93%   BMI 25.02 kg/m  Physical Exam Constitutional:      Appearance: Normal appearance.  HENT:     Head: Normocephalic and atraumatic.     Mouth/Throat:     Comments: Deferred d/t masking Cardiovascular:     Rate and Rhythm: Normal rate and regular rhythm.  Pulmonary:     Breath sounds: Wheezing and rhonchi present.     Comments: Persistent dry cough Skin:    General: Skin is warm and dry.  Neurological:     General: No focal deficit present.     Mental Status: He is alert and oriented to person, place, and time. Mental status is at baseline.  Psychiatric:        Mood and Affect: Mood normal.        Behavior: Behavior normal.        Thought Content: Thought content normal.        Judgment: Judgment normal.      Lab Results:  CBC    Component Value Date/Time   WBC 15.1 (H) 10/24/2021 0725   RBC 4.21 (L) 10/24/2021 0725   HGB 13.3 10/24/2021 0725   HGB 10.6 (L) 10/15/2017 0955   HGB 8.6 (L) 05/01/2017 0815   HCT 40.1 10/24/2021 0725   HCT 27.6 (L) 05/01/2017 0815   PLT 131 (L) 10/24/2021 0725   PLT 51 (L) 10/15/2017 0955   PLT 30 (L) 05/01/2017 0815   MCV 95.2 10/24/2021 0725   MCV 87.3 05/01/2017 0815   MCH 31.6 10/24/2021 0725   MCHC 33.2 10/24/2021 0725   RDW 13.6 10/24/2021 0725   RDW 18.4 (H)  05/01/2017 0815   LYMPHSABS 1.2 10/24/2021 0725   LYMPHSABS 1.0 05/01/2017 0815   MONOABS 2.4 (H) 10/24/2021 0725   MONOABS 1.8 (H) 05/01/2017 0815   EOSABS 0.0 10/24/2021 0725   EOSABS 0.0 05/01/2017 0815   BASOSABS 0.1 10/24/2021 0725   BASOSABS 0.3 (H) 05/01/2017 0815    BMET    Component Value Date/Time   NA 137 10/24/2021 0725   NA 137 05/01/2017 0815   K 3.2 (L) 10/24/2021 0725   K 3.6 05/01/2017 0815   CL 99 10/24/2021 0725   CO2 31 10/24/2021 0725   CO2 26 05/01/2017 0815   GLUCOSE 86 10/24/2021 0725   GLUCOSE 223 (H) 05/01/2017 0815   BUN 13  10/24/2021 0725   BUN 5.6 (L) 05/01/2017 0815   CREATININE 0.74 10/24/2021 0725   CREATININE 0.90 06/28/2021 1129   CREATININE 0.7 05/01/2017 0815   CALCIUM 8.7 (L) 10/24/2021 0725   CALCIUM 8.8 05/01/2017 0815   GFRNONAA >60 10/24/2021 0725   GFRNONAA >60 06/28/2021 1129   GFRAA >60 01/25/2020 1431    BNP    Component Value Date/Time   BNP 372.0 (H) 10/17/2021 0507    ProBNP No results found for: "PROBNP"  Imaging: DG Chest 2 View  Result Date: 10/31/2021 CLINICAL DATA:  Follow-up pneumonia.  Persistent cough EXAM: CHEST - 2 VIEW COMPARISON:  10/24/2021 FINDINGS: Improvement in right upper lobe airspace disease. Mild progression of bibasilar airspace disease.  No effusion. Heart size and vascularity normal. IMPRESSION: Mild progression of bibasilar infiltrates which may represent pneumonia. Electronically Signed   By: Franchot Gallo M.D.   On: 10/31/2021 12:02   DG CHEST PORT 1 VIEW  Result Date: 10/24/2021 CLINICAL DATA:  Community acquired pneumonia. CTA chest 10/17/2021 and two-view chest x-ray 10/21/2021. EXAM: PORTABLE CHEST 1 VIEW COMPARISON:  CT a chest 10/17/2021 and two-view chest x-ray 10/21/2021. FINDINGS: Heart size normal. Atherosclerotic calcifications are present at the aortic arch. Persistent right upper lobe airspace disease is stable. Left lower lobe airspace disease has improved slightly. No new  airspace disease is present. Dilated loops of small bowel are noted in the upper abdomen. No free air. IMPRESSION: 1. Persistent right upper lobe airspace disease. 2. Slight improvement in left lower lobe airspace disease. 3. No new airspace disease. 4. Dilated loops of small bowel in the upper abdomen. Electronically Signed   By: San Morelle M.D.   On: 10/24/2021 12:30   DG Chest 2 View  Result Date: 10/21/2021 CLINICAL DATA:  Shortness of breath.  Pneumonia and sepsis. EXAM: CHEST - 2 VIEW COMPARISON:  10/17/2021 FINDINGS: Normal heart size and mediastinal contours. No signs of pleural effusion. Bilateral areas of subsegmental atelectasis noted. Interval improvement in bilateral lower lung zone predominant reticular and nodular pulmonary opacities. Residual opacities are noted within the retrocardiac left lower lobe and right middle lobe. IMPRESSION: Improved aeration to both lungs with residual left base and right midlung airspace opacities. Electronically Signed   By: Kerby Moors M.D.   On: 10/21/2021 14:51   VAS [place] LOWER EXTREMITY VENOUS (DVT)  Result Date: 10/19/2021  Lower Venous DVT Study Patient Name:  JIRO KIESTER  Date of Exam:   10/18/2021 Medical Rec #: [id number]          Accession #:    [id number] Date of Birth: 11/21/1955          Patient Gender: M Patient Age:   31 years Exam Location:  [facility] Procedure:      VAS [place] LOWER EXTREMITY VENOUS (DVT) Referring Phys: Jerald Kief REGALADO --------------------------------------------------------------------------------  Indications: Edema.  Risk Factors: None identified. Comparison Study: No prior studies. Performing Technologist: Oliver Hum RVT  Examination Guidelines: A complete evaluation includes B-mode imaging, spectral Doppler, color Doppler, and power Doppler as needed of all accessible portions of each vessel. Bilateral testing is considered an integral part of a complete examination. Limited examinations for  reoccurring indications may be performed as noted. The reflux portion of the exam is performed with the patient in reverse Trendelenburg.  +---------+---------------+---------+-----------+----------+--------------+ RIGHT    CompressibilityPhasicitySpontaneityPropertiesThrombus Aging +---------+---------------+---------+-----------+----------+--------------+ CFV      Full           Yes      Yes                                 +---------+---------------+---------+-----------+----------+--------------+  SFJ      Full                                                        +---------+---------------+---------+-----------+----------+--------------+ FV Prox  Full                                                        +---------+---------------+---------+-----------+----------+--------------+ FV Mid   Full                                                        +---------+---------------+---------+-----------+----------+--------------+ FV DistalFull                                                        +---------+---------------+---------+-----------+----------+--------------+ PFV      Full                                                        +---------+---------------+---------+-----------+----------+--------------+ POP      Full           Yes      Yes                                 +---------+---------------+---------+-----------+----------+--------------+ PTV      Full                                                        +---------+---------------+---------+-----------+----------+--------------+ PERO     Full                                                        +---------+---------------+---------+-----------+----------+--------------+   +---------+---------------+---------+-----------+----------+--------------+ LEFT     CompressibilityPhasicitySpontaneityPropertiesThrombus Aging  +---------+---------------+---------+-----------+----------+--------------+ CFV      Full           Yes      Yes                                 +---------+---------------+---------+-----------+----------+--------------+ SFJ      Full                                                        +---------+---------------+---------+-----------+----------+--------------+  FV Prox  Full                                                        +---------+---------------+---------+-----------+----------+--------------+ FV Mid   Full                                                        +---------+---------------+---------+-----------+----------+--------------+ FV DistalFull                                                        +---------+---------------+---------+-----------+----------+--------------+ PFV      Full                                                        +---------+---------------+---------+-----------+----------+--------------+ POP      Full           Yes      Yes                                 +---------+---------------+---------+-----------+----------+--------------+ PTV      Full                                                        +---------+---------------+---------+-----------+----------+--------------+ PERO     Full                                                        +---------+---------------+---------+-----------+----------+--------------+     Summary: RIGHT: - There is no evidence of deep vein thrombosis in the lower extremity.  - No cystic structure found in the popliteal fossa.  LEFT: - There is no evidence of deep vein thrombosis in the lower extremity.  - No cystic structure found in the popliteal fossa.  *See table(s) above for measurements and observations. Electronically signed by Jamelle Haring on 10/19/2021 at 12:09:04 PM.    Final    ECHOCARDIOGRAM COMPLETE  Result Date: 10/17/2021    ECHOCARDIOGRAM REPORT   Patient Name:    DEANGLO HISSONG Date of Exam: 10/17/2021 Medical Rec #:  [id number]         Height:       71.0 in Accession #:    [id number]        Weight:       187.0 lb Date of Birth:  17-Jul-1955         BSA:          2.049 m Patient Age:  66 years          BP:           160/87 mmHg Patient Gender: M                 HR:           87 bpm. Exam Location:  Inpatient Procedure: 2D Echo, Cardiac Doppler and Color Doppler Indications:    Acute respiratory distress  History:        Patient has prior history of Echocardiogram examinations, most                 recent 11/29/2019. Risk Factors:Diabetes.  Sonographer:    Jefferey Pica Referring Phys: [phone] MURALI RAMASWAMY  Sonographer Comments: Image acquisition challenging due to respiratory motion. IMPRESSIONS  1. Left ventricular ejection fraction, by estimation, is 60 to 65%. The left ventricle has normal function. The left ventricle has no regional wall motion abnormalities. There is mild concentric left ventricular hypertrophy. Left ventricular diastolic parameters were normal.  2. Right ventricular systolic function is low normal. The right ventricular size is mildly enlarged.  3. The mitral valve is normal in structure. No evidence of mitral valve regurgitation. No evidence of mitral stenosis.  4. The aortic valve is normal in structure. Aortic valve regurgitation is not visualized. No aortic stenosis is present.  5. The inferior vena cava is normal in size with greater than 50% respiratory variability, suggesting right atrial pressure of 3 mmHg. FINDINGS  Left Ventricle: Left ventricular ejection fraction, by estimation, is 60 to 65%. The left ventricle has normal function. The left ventricle has no regional wall motion abnormalities. The left ventricular internal cavity size was normal in size. There is  mild concentric left ventricular hypertrophy. Left ventricular diastolic parameters were normal. Right Ventricle: The right ventricular size is mildly enlarged. No increase  in right ventricular wall thickness. Right ventricular systolic function is low normal. Left Atrium: Left atrial size was normal in size. Right Atrium: Right atrial size was normal in size. Pericardium: There is no evidence of pericardial effusion. Presence of epicardial fat layer. Mitral Valve: The mitral valve is normal in structure. No evidence of mitral valve regurgitation. No evidence of mitral valve stenosis. Tricuspid Valve: The tricuspid valve is normal in structure. Tricuspid valve regurgitation is not demonstrated. No evidence of tricuspid stenosis. Aortic Valve: The aortic valve is normal in structure. Aortic valve regurgitation is not visualized. No aortic stenosis is present. Aortic valve peak gradient measures 8.5 mmHg. Pulmonic Valve: The pulmonic valve was normal in structure. Pulmonic valve regurgitation is not visualized. No evidence of pulmonic stenosis. Aorta: The aortic root is normal in size and structure. Venous: The inferior vena cava is normal in size with greater than 50% respiratory variability, suggesting right atrial pressure of 3 mmHg. IAS/Shunts: No atrial level shunt detected by color flow Doppler.  LEFT VENTRICLE PLAX 2D LVIDd:         4.20 cm   Diastology LVIDs:         2.30 cm   LV e' medial:    10.10 cm/s LV PW:         1.00 cm   LV E/e' medial:  5.8 LV IVS:        1.10 cm   LV e' lateral:   12.40 cm/s LVOT diam:     2.00 cm   LV E/e' lateral: 4.7 LV SV:         79 LV SV Index:  39 LVOT Area:     3.14 cm  RIGHT VENTRICLE RV Basal diam:  3.90 cm RV Mid diam:    4.30 cm LEFT ATRIUM             Index        RIGHT ATRIUM           Index LA diam:        4.90 cm 2.39 cm/m   RA Area:     16.50 cm LA Vol (A2C):   39.4 ml 19.23 ml/m  RA Volume:   42.30 ml  20.64 ml/m LA Vol (A4C):   50.3 ml 24.55 ml/m LA Biplane Vol: 44.8 ml 21.86 ml/m  AORTIC VALVE                 PULMONIC VALVE AV Area (Vmax): 2.71 cm     PV Vmax:       1.13 m/s AV Vmax:        146.00 cm/s  PV Peak grad:  5.1  mmHg AV Peak Grad:   8.5 mmHg LVOT Vmax:      126.00 cm/s LVOT Vmean:     77.400 cm/s LVOT VTI:       0.253 m  AORTA Ao Root diam: 3.60 cm Ao Asc diam:  3.50 cm MITRAL VALVE MV Area (PHT): 3.91 cm    SHUNTS MV Decel Time: 194 msec    Systemic VTI:  0.25 m MV E velocity: 58.20 cm/s  Systemic Diam: 2.00 cm MV A velocity: 78.10 cm/s MV E/A ratio:  0.75 Kardie Tobb DO Electronically signed by Berniece Salines DO Signature Date/Time: 10/17/2021/3:21:08 PM    Final    CT Angio Chest Pulmonary Embolism (PE) W or WO Contrast  Result Date: 10/17/2021 CLINICAL DATA:  Chest pain or SOB, pleurisy or effusion suspected EXAM: CT ANGIOGRAPHY CHEST WITH CONTRAST TECHNIQUE: Multidetector CT imaging of the chest was performed using the standard protocol during bolus administration of intravenous contrast. Multiplanar CT image reconstructions and MIPs were obtained to evaluate the vascular anatomy. RADIATION DOSE REDUCTION: This exam was performed according to the departmental dose-optimization program which includes automated exposure control, adjustment of the mA and/or kV according to patient size and/or use of iterative reconstruction technique. CONTRAST:  41mL OMNIPAQUE IOHEXOL 350 MG/ML SOLN COMPARISON:  Correlation is made with a chest x-ray dated October 16, 2021 FINDINGS: Cardiovascular: Satisfactory opacification of the pulmonary arteries to the segmental level. No evidence of pulmonary embolism. Normal heart size. No pericardial effusion. Thoracic aorta has a normal appearance. Mild atheromatous calcifications of the arch of the aorta and coronary arteries. Mediastinum/Nodes: No enlarged mediastinal, hilar, or axillary lymph nodes. Thyroid gland, trachea, and esophagus demonstrate no significant findings. Moderate thickening of the distal esophagus. Lungs/Pleura: There are patchy confluent opacities seen at the bilateral mid and lower lung zones and are more prominent at the bilateral lower lobes. There is some consolidation  seen at the left lung base. No pleural effusion. Upper Abdomen: No acute abnormality. Musculoskeletal: Mild-to-moderate thoracic spondylosis with prominent anterior marginal osteophytes. Review of the MIP images confirms the above findings. IMPRESSION: There are patchy confluent opacities seen at the bilateral mid and lower lung zones with an area of consolidation at the left lung base. These findings may represent pneumonia with superimposed atelectasis. No pleural effusion. Moderate circumferential thickening of the distal esophagus. Electronically Signed   By: Frazier Richards M.D.   On: 10/17/2021 10:41   DG Chest Port 1 View  Result Date:  10/16/2021 CLINICAL DATA:  Productive cough and shortness of breath over the last 2 weeks. EXAM: PORTABLE CHEST 1 VIEW COMPARISON:  07/02/2021 FINDINGS: Heart size is normal. Aortic atherosclerosis is noted. There is bronchial thickening and pulmonary scarring, but with patchy acute infiltrates in the mid and lower lungs bilaterally. No dense consolidation or lobar collapse. No effusion. No abnormal bone finding. IMPRESSION: Background pattern of scattered pulmonary scarring. Superimposed acute bronchial thickening and patchy pulmonary infiltrates in the mid and lower lungs consistent with bronchopneumonia. Electronically Signed   By: Nelson Chimes M.D.   On: 10/16/2021 08:28     Assessment & Plan:   Multifocal pneumonia - Hospitalizated from 10/16/2021 - 10/24/2021 for acute hypoxic respiratory failure secondary to bilateral pneumonia due to parainfluenza.  Patient was treated with Rocephin and 7-day course of Augmentin.  Patient reports increase cough last 1-2 days.  He had scattered rhonchi and wheezing on exam today.  Chest x-ray showed mild progression of bilateral infiltrates. Sending in RX Levaquin 500 mg daily x7 days and prednisone taper. Advised he take Mucinex-dm $RemoveBeforeD'1200mg'AWmFSziImsyCOp$  twice daily for cough. Checking CBC, BMET and BNP. If resp symptoms worsen over the weekend  advised ED evaluation. Follow-up in 7 to 10 days in office.  Acute respiratory failure with hypoxia ([place]) - Patient continues to require 3 L supplemental oxygen  Elevated blood pressure reading - He was started on Norvasc 5 mg daily during most recent hospitalization due to elevated blood pressure.  Blood pressure today is 104/62.  Instructed patient to hold Norvasc today and resume 2.5 mg daily if blood pressure greater than 100/60.  Need to follow-up with cardiology.   Joseph Ehrich, NP 10/31/2021

## 2021-10-31 NOTE — Telephone Encounter (Signed)
Critical lab result!   WBC 41.2 Smear abnormal cells sent to [facility].   Dr Silas Flood please advise

## 2021-10-31 NOTE — Assessment & Plan Note (Addendum)
-   Hospitalizated from 10/16/2021 - 10/24/2021 for acute hypoxic respiratory failure secondary to bilateral pneumonia due to parainfluenza.  Patient was treated with Rocephin and 7-day course of Augmentin.  Patient reports increase cough last 1-2 days.  He had scattered rhonchi and wheezing on exam today.  Chest x-ray showed mild progression of bilateral infiltrates. Sending in RX Levaquin 500 mg daily x7 days and prednisone taper. Advised he take Mucinex-dm '1200mg'$  twice daily for cough. Checking CBC, BMET and BNP. If resp symptoms worsen over the weekend advised ED evaluation. Follow-up in 7 to 10 days in office.

## 2021-10-31 NOTE — Telephone Encounter (Signed)
Await smear results. Could be demargination from recent steroid course. Prescribed levofloxacin and prednisone today - no changes at this time.

## 2021-10-31 NOTE — Assessment & Plan Note (Signed)
-   He was started on Norvasc 5 mg daily during most recent hospitalization due to elevated blood pressure.  Blood pressure today is 104/62.  Instructed patient to hold Norvasc today and resume 2.5 mg daily if blood pressure greater than 100/60.  Need to follow-up with cardiology.

## 2021-10-31 NOTE — Patient Instructions (Addendum)
Recommendations: - Start Levaquin and prednisone - Hold Norvasc today, you can resume Norvasc 2.5 mg tomorrow if blood pressures greater than 100 - Take Mucinex-dm 1200 mg twice daily for 7 to 10 days - Continue to wear supplemental oxygen 3-4 L to maintain O2 >88-90% until follow-up  Orders - Stat chest x-ray - Labs today (cbc with diff, BMET, BNP)  Follow-up - 10-14 days with Eustaquio Maize NP

## 2021-11-01 ENCOUNTER — Telehealth: Payer: Self-pay | Admitting: Acute Care

## 2021-11-01 ENCOUNTER — Emergency Department (HOSPITAL_BASED_OUTPATIENT_CLINIC_OR_DEPARTMENT_OTHER)
Admission: EM | Admit: 2021-11-01 | Discharge: 2021-11-01 | Disposition: A | Discharge status: Home / Self Care | Payer: BC Managed Care – PPO | Attending: Emergency Medicine | Admitting: Emergency Medicine

## 2021-11-01 ENCOUNTER — Emergency Department (HOSPITAL_BASED_OUTPATIENT_CLINIC_OR_DEPARTMENT_OTHER): Payer: BC Managed Care – PPO

## 2021-11-01 ENCOUNTER — Other Ambulatory Visit: Payer: Self-pay

## 2021-11-01 DIAGNOSIS — Z79899 Other long term (current) drug therapy: Secondary | ICD-10-CM | POA: Diagnosis not present

## 2021-11-01 DIAGNOSIS — D72829 Elevated white blood cell count, unspecified: Secondary | ICD-10-CM | POA: Diagnosis not present

## 2021-11-01 DIAGNOSIS — J168 Pneumonia due to other specified infectious organisms: Secondary | ICD-10-CM | POA: Diagnosis not present

## 2021-11-01 DIAGNOSIS — W19XXXA Unspecified fall, initial encounter: Secondary | ICD-10-CM | POA: Diagnosis not present

## 2021-11-01 DIAGNOSIS — R918 Other nonspecific abnormal finding of lung field: Secondary | ICD-10-CM | POA: Diagnosis not present

## 2021-11-01 DIAGNOSIS — R799 Abnormal finding of blood chemistry, unspecified: Secondary | ICD-10-CM | POA: Insufficient documentation

## 2021-11-01 DIAGNOSIS — J189 Pneumonia, unspecified organism: Secondary | ICD-10-CM | POA: Insufficient documentation

## 2021-11-01 DIAGNOSIS — R0682 Tachypnea, not elsewhere classified: Secondary | ICD-10-CM | POA: Insufficient documentation

## 2021-11-01 LAB — URINALYSIS, ROUTINE W REFLEX MICROSCOPIC
Bilirubin Urine: NEGATIVE
Glucose, UA: >1000 mg/dL — AB
Ketones, ur: NEGATIVE mg/dL
Leukocytes,Ua: NEGATIVE
Nitrite: NEGATIVE
Specific Gravity, Urine: 1.01 (ref 1.005–1.030)
pH: 6 (ref 5.0–8.0)

## 2021-11-01 LAB — CBC WITH DIFFERENTIAL/PLATELET
Abs Immature Granulocytes: 0.29 10*3/uL — ABNORMAL HIGH (ref 0.00–0.07)
Basophils Absolute: 0 10*3/uL (ref 0.0–0.1)
Basophils Relative: 0 %
Eosinophils Absolute: 0.1 10*3/uL (ref 0.0–0.5)
Eosinophils Relative: 1 %
HCT: 37.1 % — ABNORMAL LOW (ref 39.0–52.0)
Hemoglobin: 12.5 g/dL — ABNORMAL LOW (ref 13.0–17.0)
Immature Granulocytes: 2 %
Lymphocytes Relative: 4 %
Lymphs Abs: 0.7 10*3/uL (ref 0.7–4.0)
MCH: 31.1 pg (ref 26.0–34.0)
MCHC: 33.7 g/dL (ref 30.0–36.0)
MCV: 92.3 fL (ref 80.0–100.0)
Monocytes Absolute: 1.2 10*3/uL — ABNORMAL HIGH (ref 0.1–1.0)
Monocytes Relative: 7 %
Neutro Abs: 14.4 10*3/uL — ABNORMAL HIGH (ref 1.7–7.7)
Neutrophils Relative %: 86 %
Platelets: 87 10*3/uL — ABNORMAL LOW (ref 150–400)
RBC: 4.02 MIL/uL — ABNORMAL LOW (ref 4.22–5.81)
RDW: 14.1 % (ref 11.5–15.5)
Smear Review: DECREASED
WBC: 16.6 10*3/uL — ABNORMAL HIGH (ref 4.0–10.5)
nRBC: 0 % (ref 0.0–0.2)

## 2021-11-01 LAB — COMPREHENSIVE METABOLIC PANEL
ALT: 51 U/L — ABNORMAL HIGH (ref 0–44)
AST: 51 U/L — ABNORMAL HIGH (ref 15–41)
Albumin: 3.9 g/dL (ref 3.5–5.0)
Alkaline Phosphatase: 75 U/L (ref 38–126)
Anion gap: 13 (ref 5–15)
BUN: 17 mg/dL (ref 8–23)
CO2: 25 mmol/L (ref 22–32)
Calcium: 10 mg/dL (ref 8.9–10.3)
Chloride: 95 mmol/L — ABNORMAL LOW (ref 98–111)
Creatinine, Ser: 0.8 mg/dL (ref 0.61–1.24)
GFR, Estimated: >60 mL/min (ref >60)
Glucose, Bld: 335 mg/dL — ABNORMAL HIGH (ref 70–99)
Potassium: 4.5 mmol/L (ref 3.5–5.1)
Sodium: 133 mmol/L — ABNORMAL LOW (ref 135–145)
Total Bilirubin: 1.1 mg/dL (ref 0.3–1.2)
Total Protein: 6.5 g/dL (ref 6.5–8.1)

## 2021-11-01 LAB — PATHOLOGIST SMEAR REVIEW

## 2021-11-01 LAB — LACTIC ACID, PLASMA: Lactic Acid, Venous: 2.9 mmol/L (ref 0.5–1.9)

## 2021-11-01 MED ORDER — LACTATED RINGERS IV BOLUS
1000.0000 mL | Freq: Once | INTRAVENOUS | Status: AC
  Administered 2021-11-01: 1000 mL via INTRAVENOUS

## 2021-11-01 MED ORDER — BENZONATATE 200 MG PO CAPS: 200.0000 mg | ORAL_CAPSULE | Freq: Three times a day (TID) | ORAL | 3 refills | Status: DC | PRN

## 2021-11-01 NOTE — ED Notes (Signed)
Pt awake and alert - RR even and unlabored on 3L O2 via [place] at rest.  Pt continues to report he wishes to leave and confirms AMA status.  O2 switched to patient portable tank.  Pt has received recommendations for f/u and denies any additional questions concerns needs.

## 2021-11-01 NOTE — Progress Notes (Signed)
Spoke with pt and notified of results per Beth.  Pt verbalized understanding and denied any questions. 

## 2021-11-01 NOTE — Telephone Encounter (Signed)
Yes ok to refill 

## 2021-11-01 NOTE — ED Notes (Signed)
RT called to room for pt desaturation. Pt currently on [place] 3LPM w/sats of 87%. Pt oxygen titrated to 5 LPM w/sats of 94% at this time. Pt respiratory status stable at this time w/no distress noted. MD aware. RT will continue to monitor.

## 2021-11-01 NOTE — ED Notes (Signed)
Pt walked a lap around the department PO2 sats started at 95% on 3lpm with a HR of 105 Pts PO2 sats dropped to 80% on 3lpm with a HR 104-105.

## 2021-11-01 NOTE — Discharge Instructions (Signed)
Follow-up with your pulmonologist.  Take the antibiotic that was previously prescribed.  We recommended admission but you have chosen to leave against this medical advice.  If you change your mind or if you have any worsening of your condition, please return immediately to ER for reassessment.

## 2021-11-01 NOTE — ED Provider Notes (Signed)
[facility] EMERGENCY DEPT Provider Note   CSN: [id number] Arrival date & time: 11/01/21  1904     History  Chief Complaint  Patient presents with   Abnormal Lab   Fall    Joseph Powell is a 66 y.o. male.  Presents emergency room due to concern for abnormal lab.  Patient states that he was told that his white blood cell count was elevated and he needed to come to the ER for evaluation.  Patient was recently admitted to the hospital for multifocal pneumonia.  Discharged on 3 L nasal cannula.  Follow-up with pulmonology yesterday, CXR with possible worsening of infiltrate and outpatient labs sent.  He was started on Levaquin which she states he has started taking.  The blood work from yesterday resulted today and was concerning for a white blood cell count of 41,000.  Patient states that overall he feels like he is doing better since time of discharge.  He has been periodically monitoring his oxygen levels at home with a portable pulse ox and they have been fine.  He does not feel any more short of breath today than he did at time of discharge.  He denies any chest pain.  He does report having a minor fall and hitting his right elbow but denies any ongoing elbow pain at this time.  Denies hitting head or other traumatic process.  Completed extensive chart review, reviewed discharge summary from 6/22 as well as telephone note from his pulmonology office earlier today.  Patient has a history of lymphoma, in remission, patient found to have haemophilus influenza bacteremia.  He was treated with IV ceftriaxone, per ID transition to oral Augmentin.  Followed up with pulmonology yesterday, given Levaquin prescription.  HPI     Home Medications Prior to Admission medications   Medication Sig Start Date End Date Taking? Authorizing Provider  allopurinol (ZYLOPRIM) 300 MG tablet 1 tablet 06/22/21   [provider]  amLODipine (NORVASC) 5 MG tablet Take 1 tablet (5 mg  total) by mouth daily. 10/25/21 11/24/21  Darliss Cheney, MD  benzonatate (TESSALON) 200 MG capsule Take 1 capsule (200 mg total) by mouth 3 (three) times daily as needed for cough. 11/01/21 11/01/22  Martyn Ehrich, NP  CALCIUM PO Take by mouth.    [provider]  carvedilol (COREG) 3.125 MG tablet Take 1 tablet (3.125 mg total) by mouth 2 (two) times daily with a meal. PATIENT MUST SCHEDULE ANNUAL APPOINTMENT FOR FUTURE REFILLS. FIRST ATTEMPT 08/28/21   Croitoru, Dani Gobble, MD  chlorpheniramine (CHLOR-TRIMETON) 4 MG tablet Take 4 mg by mouth 2 (two) times daily as needed for allergies.    [provider]  Cyanocobalamin (VITAMIN B-12 PO) Take by mouth.    [provider]  levofloxacin (LEVAQUIN) 500 MG tablet Take 1 tablet (500 mg total) by mouth daily. 10/31/21   Martyn Ehrich, NP  predniSONE (DELTASONE) 10 MG tablet 4 tabs for 2 days, then 3 tabs for 2 days, 2 tabs for 2 days, then 1 tab for 2 days, then stop 10/31/21   Martyn Ehrich, NP  Vitamin D, Ergocalciferol, (DRISDOL) 1.25 MG (50000 UNIT) CAPS capsule Take 1 capsule by mouth once a week 08/28/21   Brunetta Genera, MD      Allergies    Patient has no known allergies.    Review of Systems   Review of Systems  Constitutional:  Positive for fatigue. Negative for chills and fever.  HENT:  Negative for ear  pain and sore throat.   Eyes:  Negative for pain and visual disturbance.  Respiratory:  Positive for cough and shortness of breath.   Cardiovascular:  Negative for chest pain and palpitations.  Gastrointestinal:  Negative for abdominal pain and vomiting.  Genitourinary:  Negative for dysuria and hematuria.  Musculoskeletal:  Negative for arthralgias and back pain.  Skin:  Negative for color change and rash.  Neurological:  Negative for seizures and syncope.  All other systems reviewed and are negative.   Physical Exam Updated Vital Signs BP 132/81   Pulse 93   Temp 98.2 F (36.8 C) (Oral)    Resp 18   Ht '5\' 11"'$  (1.803 m)   Wt 81.2 kg   SpO2 93%   BMI 24.97 kg/m  Physical Exam Vitals and nursing note reviewed.  Constitutional:      General: He is not in acute distress.    Appearance: He is well-developed.  HENT:     Head: Normocephalic and atraumatic.  Eyes:     Conjunctiva/sclera: Conjunctivae normal.  Cardiovascular:     Rate and Rhythm: Normal rate and regular rhythm.     Heart sounds: No murmur heard. Pulmonary:     Comments: Slight tachypnea but no increased work of breathing, no respiratory distress, speaking full sentences, somewhat coarse breath sounds noted at both bases Abdominal:     Palpations: Abdomen is soft.     Tenderness: There is no abdominal tenderness.  Musculoskeletal:        General: No swelling.     Cervical back: Neck supple.  Skin:    General: Skin is warm and dry.     Capillary Refill: Capillary refill takes less than 2 seconds.  Neurological:     Mental Status: He is alert.  Psychiatric:        Mood and Affect: Mood normal.     ED Results / Procedures / Treatments   Labs (all labs ordered are listed, but only abnormal results are displayed) Labs Reviewed  LACTIC ACID, PLASMA - Abnormal; Notable for the following components:      Result Value   Lactic Acid, Venous 2.9 (*)    All other components within normal limits  COMPREHENSIVE METABOLIC PANEL - Abnormal; Notable for the following components:   Sodium 133 (*)    Chloride 95 (*)    Glucose, Bld 335 (*)    AST 51 (*)    ALT 51 (*)    All other components within normal limits  CBC WITH DIFFERENTIAL/PLATELET - Abnormal; Notable for the following components:   WBC 16.6 (*)    RBC 4.02 (*)    Hemoglobin 12.5 (*)    HCT 37.1 (*)    Platelets 87 (*)    Neutro Abs 14.4 (*)    Monocytes Absolute 1.2 (*)    Abs Immature Granulocytes 0.29 (*)    All other components within normal limits  CULTURE, BLOOD (ROUTINE X 2)  CULTURE, BLOOD (ROUTINE X 2)  URINE CULTURE  LACTIC ACID,  PLASMA  URINALYSIS, ROUTINE W REFLEX MICROSCOPIC    EKG None  Radiology DG Chest Port 1 View  Result Date: 11/01/2021 CLINICAL DATA:  [id number]. pt sts that he seen pulmonology and they called stating WBC was abnormally high. Sts that he was dx with PNA and been wearing 3L [place] since then. Pt sts he feels better today than recently. EXAM: PORTABLE CHEST 1 VIEW COMPARISON:  Chest x-ray 10/31/2021, CT chest 10/17/2021 FINDINGS: The heart and mediastinal  contours are unchanged. Aortic calcification. Persistent stable to slightly worsened left lower lung zone patchy airspace opacity. No pulmonary edema. No pleural effusion. No pneumothorax. No acute osseous abnormality. IMPRESSION: 1. Stable to slightly worsened left lower lung zone patchy airspace opacity. Followup PA and lateral chest X-ray is recommended in 3-4 weeks following therapy to ensure resolution and exclude underlying malignancy. 2.  Aortic Atherosclerosis (ICD10-I70.0). Electronically Signed   By: Iven Finn M.D.   On: 11/01/2021 20:57   DG Chest 2 View  Result Date: 10/31/2021 CLINICAL DATA:  Follow-up pneumonia.  Persistent cough EXAM: CHEST - 2 VIEW COMPARISON:  10/24/2021 FINDINGS: Improvement in right upper lobe airspace disease. Mild progression of bibasilar airspace disease.  No effusion. Heart size and vascularity normal. IMPRESSION: Mild progression of bibasilar infiltrates which may represent pneumonia. Electronically Signed   By: Franchot Gallo M.D.   On: 10/31/2021 12:02    Procedures Procedures    Medications Ordered in ED Medications  lactated ringers bolus 1,000 mL (0 mLs Intravenous Stopped 11/01/21 2302)    ED Course/ Medical Decision Making/ A&P                           Medical Decision Making Amount and/or Complexity of Data Reviewed Labs: ordered. Radiology: ordered.   66 year old male presenting for abnormal blood test.  Completed extensive chart review, reviewed discharge summary from 6/22 as well  as telephone note from his pulmonology office earlier today.  Patient has a history of lymphoma, in remission, patient found to have haemophilus influenza bacteremia.  He was treated with IV ceftriaxone, per ID transition to oral Augmentin.  Followed up with pulmonology yesterday, given Levaquin prescription.  WBC 41.  On presentation to ER, patient actually appears well, he is not in distress, he is speaking in full sentences, no significant increased work of breathing.  Repeat CXR today was independently reviewed and interpreted by myself, radiologist report there is mild worsening of the left lower lung opacity when compared to prior.  Repeat white count today is markedly improved, 16.  Patient was trialed with an ambulation trial and his oxygen level dropped to 80%.  Given my review of the pulmonology notes, review of his discharge summary and the drop in oxygen saturation, I recommended patient be admitted to the hospital for IV antibiotics and closer observation.  Patient however persistently adamant that he does not want to be admitted and was feeling better today.  He demonstrated good understanding of my concerns about his clinical condition and potential for further deterioration.  He is agreeable to take the oral Levaquin that was previously prescribed and follow-up closely with his primary doctors including pulmonology.  We will have patient sign out AMA.   Regarding the fall and possible elbow trauma, I do not appreciate any significant trauma to his elbow and he denies any ongoing pain at this time.  Does not feel this needs any imaging today.          Final Clinical Impression(s) / ED Diagnoses Final diagnoses:  Leukocytosis, unspecified type  Pneumonia due to infectious organism, unspecified laterality, unspecified part of lung    Rx / DC Orders ED Discharge Orders     None         Lucrezia Starch, MD 11/01/21 2350

## 2021-11-01 NOTE — Telephone Encounter (Signed)
I received a call as the on call provider tonight from Joseph Powell , regarding her husband Joseph Powell . Per Joseph Powell, the patient had received a call from our office [facility]) instructing the patient to go to the hospital for IV antibiotics. He told his wife it was a male who called , and that but he did not ask any additional questions.Patient's wife wanted clarification of why the patient needed admission.  As I had never seen the patient, and did not see a telephone note regarding the recommendation the patient go to the ED, I called Joseph Barrow NP, who saw the patient 6/29, and she told me she had her nurse call the patient and ask him to go to the ED for inpatient IV antibiotic treatment. The patient received the call at 1700 today.   Pt. Was seen in the pulmonary office 10/31/2021 as a hospital follow up. He had been in the hospital 10/16/2021-10/24/2021 with  acute hypoxic respiratory failure secondary to bilateral pneumonia from parainfluenza.  He was on cruise in [place] when he got sick. Chest x-ray showed background pattern of scattered pulmonary scarring, superimposed acute bronchial thickening and patchy pulmonary infiltrates in the mid to lower lungs consistent with bronchopneumonia.  Blood cultures positive for haemophilus influenzae PNA. Sputum culture rare respiratory flora, Legionella antigen negative, strep pneumonia negative.  ID consulted.  He was started on Rocephin which was transitioned to Augmentin.  Patient completed 7-day course of antibiotics per ID.  He initially required high flow nasal cannula oxygen 25 L 50% on admission.  Treated with BiPAP during hospitalization.  Echocardiogram showed normal EF, low normal RV function in the setting of pneumonia.  He received 1 dose of Lasix.  Chest x-ray showed improvement. Patient was discharged on oxygen.  At follow up in the clinic 6/29, he remained on 3 L [place] oxygen, and desaturated with cough. He had scattered rhonchi and  wheezing on exam . His BP was low at 104/62, Chest x-ray showed mild progression of bilateral infiltrates. CBC was drawn which was concerning for WBC of 41.2, and platelets of 90,000. A smear was done to determine if this could be demargination from recent steroid course.The smear was done today which confirms infection. While patient was prescribed Levaquin and Prednisone yesterday, Joseph Barrow NP feels his condition is tenuous, and feels patient needs IP antibiotic therapy.She recommended  ED evaluation since CXR showed progression of pneumonia and he remains symptomatic .  I have advised his wife to take him to the ED. While I was on the phone with Joseph Powell, the patient fell on their porch, and hit his head on the concrete. I advised her he will also need this worked up in the ED. He is not on blood thinners. I am concerned he may have fallen due to hypoxia or low BP.   Joseph Powell is taking him to the ED now. She verbalized understanding of the above, and had no further questions.  [name], MSN, AGACNP-BC [place] for personal pager PCCM on call pager [phone]  11/01/2021 7:12 PM

## 2021-11-01 NOTE — Progress Notes (Signed)
Patient needs to go to ED. WBC was critically elevated. Likely needs IV antibiotics d/t worsening pneumonia

## 2021-11-01 NOTE — ED Triage Notes (Signed)
POV, pt sts that he seen pulmonology and they called stating WBC was abnormally high. Sts that he was dx with PNA and been wearing 3L [place] since then. Pt sts he feels better today than recently. When trying to get to car he fell on porch and sts that he hit his right elbow. Denies other injuries and LOC, amb, a&O x4

## 2021-11-01 NOTE — Telephone Encounter (Signed)
Received a message from patient asking for a refill on his Tessalon perles. I looked at his AVS and did not any mentioning of this.   Beth, can you please advise if you are ok with [place] refilling his Tessalon perles. Thanks!

## 2021-11-01 NOTE — ED Notes (Signed)
Pt awake and alert lying in bed with spouse at bedside.  No acute distress observed.  RR even and unlabored on suppl O2 via [place] at this time with continuous pulse ox and cardiac monitoring maintained.  Pt denies pain at this time.  Intermittent congested cough observed while at this bedside.  Abdomen soft, nontender.  Skin warm dry and intact.  Plan for Blood culturesx2, repeat lactic (after 1L NS bolus) and UA -- pt and family upated and agreeable on plan.

## 2021-11-01 NOTE — Progress Notes (Signed)
I sent a message earlier, WBC was critically high at 41. Smear confirms this is from infection. Recommending ED evaluation since CXR showed progression of pneumonia and he remains symptomatic

## 2021-11-01 NOTE — ED Notes (Signed)
RT and EMT walked pt around dept monitoring sats. Initial sat on [place] 3LPM 85% with sats during and upon completion of walk 80% on [place] 3 LPM. Pts respiratory status remained stable throughout walk w/no distress noted. RT will continue to monitor.

## 2021-11-01 NOTE — ED Notes (Signed)
Pt oob ambulating with assistance of ED tech Amy with RT Abigail Butts to monitor ambulating pulse ox.  No acute distress/changes noted

## 2021-11-02 LAB — LACTIC ACID, PLASMA: Lactic Acid, Venous: 2.1 mmol/L (ref 0.5–1.9)

## 2021-11-03 LAB — URINE CULTURE: Culture: NO GROWTH

## 2021-11-06 ENCOUNTER — Telehealth: Payer: Self-pay | Admitting: Hematology

## 2021-11-06 NOTE — Telephone Encounter (Signed)
Pt calling needing to speak with someone concerning a msg to go to ER for IV antibiotics b/c of his white blood cell count.Joseph Powell

## 2021-11-06 NOTE — Telephone Encounter (Signed)
Left message for patient to call back  

## 2021-11-06 NOTE — Telephone Encounter (Signed)
Left message with rescheduled upcoming appointment due to provider's schedule. 

## 2021-11-07 ENCOUNTER — Other Ambulatory Visit: Payer: Self-pay

## 2021-11-07 ENCOUNTER — Ambulatory Visit: Payer: BC Managed Care – PPO | Attending: Internal Medicine

## 2021-11-07 VITALS — HR 96

## 2021-11-07 DIAGNOSIS — R262 Difficulty in walking, not elsewhere classified: Secondary | ICD-10-CM | POA: Diagnosis not present

## 2021-11-07 DIAGNOSIS — M6281 Muscle weakness (generalized): Secondary | ICD-10-CM

## 2021-11-07 LAB — CULTURE, BLOOD (ROUTINE X 2)
Culture: NO GROWTH
Culture: NO GROWTH
Specimen Description: ADEQUATE
Specimen Description: ADEQUATE

## 2021-11-07 NOTE — Therapy (Signed)
OUTPATIENT PHYSICAL THERAPY LOWER EXTREMITY EVALUATION   Patient Name: Joseph Powell MRN: [id number] DOB:1955-08-01, 66 y.o., male Today's Date: 11/08/2021   PT End of Session - 11/07/21 1524     Visit Number 1    Number of Visits 9    Date for PT Re-Evaluation 01/10/22    Authorization Type BCBS COMM PPO    PT Start Time 6270    PT Stop Time 1550    PT Time Calculation (min) 44 min    Equipment Utilized During Treatment Oxygen   3L   Activity Tolerance Patient tolerated treatment well    Behavior During Therapy WFL for tasks assessed/performed             Past Medical History:  Diagnosis Date   Allergic rhinitis    Asthma    slight   Bell's palsy    GERD (gastroesophageal reflux disease)    Gout    Hypercholesteremia    Melanoma ([place])    Osteoarthritis    Sleep apnea    Resolving   Thrombocytopenia ([place])    Tubular adenoma    Past Surgical History:  Procedure Laterality Date   IR FLUORO GUIDE PORT INSERTION RIGHT  02/17/2017   IR REMOVAL TUN ACCESS W/ PORT W/O FL MOD SED  01/05/2018   IR US GUIDE VASC ACCESS RIGHT  02/17/2017   KNEE SURGERY  1993   MELANOMA EXCISION     NASAL RECONSTRUCTION     VASECTOMY  1999   Patient Active Problem List   Diagnosis Date Noted   Elevated blood pressure reading 10/31/2021   Multifocal pneumonia    Non-recurrent acute suppurative otitis media of both ears without spontaneous rupture of tympanic membranes    Haemophilus infection    Penicillin allergy    Malnutrition of moderate degree 10/18/2021   Pneumonia of both lungs due to infectious organism 10/17/2021   Hypokalemia 10/17/2021   Acute respiratory failure with hypoxia ([place]) 10/17/2021   Sepsis due to pneumonia ([place]) 10/16/2021   Abdominal pain 07/22/2021   Type 2 diabetes mellitus without complication, without long-term current use of insulin ([place]) 03/05/2021   Mixed hyperlipidemia 03/05/2021   Encounter for general adult medical examination with abnormal  findings 03/05/2021   Non-seasonal allergic rhinitis due to pollen 03/05/2021   Cough variant asthma vs UACS 01/27/2017   Mild asthma 08/05/2016   DJD (degenerative joint disease) 08/05/2016   HLD (hyperlipidemia) 07/27/2016   GERD without esophagitis 07/27/2016   Gout 07/27/2016   OSA (obstructive sleep apnea) 09/19/2013   Thrombocytopenia ([place]) 04/14/2011   SCHATZKI'S RING 08/21/2009    PCP: Faustino Congress, NP  REFERRING PROVIDER: Elmarie Shiley, MD  REFERRING DIAG: R26.81 (ICD-10-CM) - Unstable gait  THERAPY DIAG:  Difficulty in walking, not elsewhere classified - Plan: PT plan of care cert/re-cert  Muscle weakness (generalized) - Plan: PT plan of care cert/re-cert  Rationale for Evaluation and Treatment Rehabilitation  ONSET DATE: June, 1 2023  SUBJECTIVE:   SUBJECTIVE STATEMENT: Pt reports when travelling in [place], he developed pneumonia. He returned to the [place] and was admitted to the hospital 10/16/21 and Dced from the hospital 10/24/21. Pt reports his balance has been good, but he is weak and his activity tolerance is low. Pt notes he is able to walk for approx 10 mins.  PERTINENT HISTORY: lymphoma diagnosed 2018, had stem cell transplant 2019; TKR   PAIN:  Are you having pain? No  PRECAUTIONS: None  WEIGHT BEARING RESTRICTIONS No  FALLS:  Has patient fallen in last 6 months? Yes. Number of falls 1   Tripped over O2 cart  LIVING ENVIRONMENT: Lives with: lives with their family Lives in: House/apartment No issues c accessing home c steps to mobility within home  OCCUPATION: Corporate treasurer- Desk computer work  PLOF: Independent  PATIENT GOALS : Improve activity tolerance   OBJECTIVE:   PATIENT SURVEYS:  FOTO 63% perceived LOF; predicted 73%  COGNITION:  Overall cognitive status: Within functional limits for tasks assessed     SENSATION: WFL  LOWER EXTREMITY ROM:   B Legs grossly WNLs  LOWER EXTREMITY MMT:  MMT Right eval  Left eval  Hip flexion 4 4  Hip extension 4 4  Hip abduction 4 4  Hip adduction 4 4  Hip internal rotation 4 4  Hip external rotation 4 4  Knee flexion 5 5  Knee extension 5 5  Ankle dorsiflexion 5 5  Ankle plantarflexion 5 5  Ankle inversion    Ankle eversion     (Blank rows = not tested)  FUNCTIONAL TESTS:  5 times sit to stand: 19.1" 2 minute walk test: 370"  O2 sat 96%, HR 112  GAIT: Distance walked: 370" Assistive device utilized: None Level of assistance: Complete Independence Comments: O2 at 3L; somewhat hard exertion ([place])   TODAY'S TREATMENT: Instructed in exertion management - conversational ambulation and limit exertion to somewhat hard level  PATIENT EDUCATION:  Education details: Eval findings, POC, HEP Person educated: Patient and Spouse Education method: Explanation, Demonstration, Tactile cues, Verbal cues, and Handouts Education comprehension: verbalized understanding, returned demonstration, verbal cues required, and tactile cues required   HOME EXERCISE PROGRAM: Access Code: [email or web address] URL: [email or web address] Date: 11/08/2021 Prepared by: Gar Ponto  Exercises - Sit to Stand Without Arm Support  - 4-6 x daily - 7 x weekly - 1 sets - 5-10 reps Walking program - To walk 2x per day gradually increasing distance as tolerated and managing per exertion education. Short walks periodically in home.  ASSESSMENT:  CLINICAL IMPRESSION: Patient is a 66 y.o. M who was seen today for physical therapy evaluation and treatment for Unstable gait, weakness, and decreased activity tolerance.    OBJECTIVE IMPAIRMENTS decreased activity tolerance, decreased balance, difficulty walking, and decreased strength.   ACTIVITY LIMITATIONS carrying, lifting, standing, squatting, stairs, transfers, bathing, and locomotion level  PARTICIPATION LIMITATIONS: meal prep, cleaning, laundry, shopping, community activity, and yard work  [place], Past/current experiences, and Time since onset of injury/illness/exacerbation are also affecting patient's functional outcome.   REHAB POTENTIAL: Good  CLINICAL DECISION MAKING: Stable/uncomplicated  EVALUATION COMPLEXITY: Low   GOALS:  SHORT TERM GOALS: = LTGs    LONG TERM GOALS: Target date: 01/10/22   Pt wil tolerated walking 20 mins or greater in a single attempt for improved tolerance  Baseline: 10 mins Goal status: INITIAL  2.  Pt will demonstrate 4+/5 or greater strength of his legs for improved function Baseline: 4/5 Goal status: INITIAL  3.  Pt's 5xSTS will improve to 14 sec or less as indication of improved function Baseline: 19.1" s use of hands Goal status: INITIAL  4.  2MWT will increase to to 42f as indication of improved strength and activity tolerance Baseline: 370 ft Goal status: INITIAL  5.  Pt's FOTO score will improve to the predicted value of 73% as indication of improved function Baseline: 63% Goal status: INITIAL  6.  Pt will be ind in a final HEP to maintain achieved LOF Baseline: started on  eval Goal status: INITIAL   PLAN: PT FREQUENCY: 1x/week  PT DURATION: 8 weeks  PLANNED INTERVENTIONS: Therapeutic exercises, Therapeutic activity, Neuromuscular re-education, Balance training, Gait training, Patient/Family education, Stair training, Aquatic Therapy, Dry Needling, Electrical stimulation, Cryotherapy, Moist heat, Taping, Ultrasound, Ionotophoresis '4mg'$ /ml Dexamethasone, Manual therapy, and Re-evaluation  PLAN FOR NEXT SESSION: review FOTO; progress therex; use of modalities as indicated  [employer] MS, PT 11/08/21 8:43 AM

## 2021-11-08 ENCOUNTER — Ambulatory Visit: Payer: BC Managed Care – PPO | Admitting: Physician Assistant

## 2021-11-13 NOTE — Therapy (Signed)
OUTPATIENT PHYSICAL THERAPY TREATMENT NOTE   Patient Name: Joseph Powell MRN: [id number] DOB:1956/04/05, 66 y.o., male Today's Date: 11/14/2021  PCP: Faustino Congress, NP REFERRING PROVIDER: Elmarie Shiley, MD  END OF SESSION:   PT End of Session - 11/14/21 1334     Visit Number 2    Number of Visits 9    Date for PT Re-Evaluation 01/10/22    Authorization Type [employer] COMM PPO    PT Start Time 1016    PT Stop Time 1100    PT Time Calculation (min) 44 min    Equipment Utilized During Treatment Oxygen   3L   Activity Tolerance Patient tolerated treatment well    Behavior During Therapy WFL for tasks assessed/performed             Past Medical History:  Diagnosis Date   Allergic rhinitis    Asthma    slight   Bell's palsy    GERD (gastroesophageal reflux disease)    Gout    Hypercholesteremia    Melanoma ([place])    Osteoarthritis    Sleep apnea    Resolving   Thrombocytopenia ([place])    Tubular adenoma    Past Surgical History:  Procedure Laterality Date   IR FLUORO GUIDE PORT INSERTION RIGHT  02/17/2017   IR REMOVAL TUN ACCESS W/ PORT W/O FL MOD SED  01/05/2018   IR US GUIDE VASC ACCESS RIGHT  02/17/2017   KNEE SURGERY  1993   MELANOMA EXCISION     NASAL RECONSTRUCTION     VASECTOMY  1999   Patient Active Problem List   Diagnosis Date Noted   Elevated blood pressure reading 10/31/2021   Multifocal pneumonia    Non-recurrent acute suppurative otitis media of both ears without spontaneous rupture of tympanic membranes    Haemophilus infection    Penicillin allergy    Malnutrition of moderate degree 10/18/2021   Pneumonia of both lungs due to infectious organism 10/17/2021   Hypokalemia 10/17/2021   Acute respiratory failure with hypoxia ([place]) 10/17/2021   Sepsis due to pneumonia ([place]) 10/16/2021   Abdominal pain 07/22/2021   Type 2 diabetes mellitus without complication, without long-term current use of insulin ([place]) 03/05/2021   Mixed  hyperlipidemia 03/05/2021   Encounter for general adult medical examination with abnormal findings 03/05/2021   Non-seasonal allergic rhinitis due to pollen 03/05/2021   Cough variant asthma vs UACS 01/27/2017   Mild asthma 08/05/2016   DJD (degenerative joint disease) 08/05/2016   HLD (hyperlipidemia) 07/27/2016   GERD without esophagitis 07/27/2016   Gout 07/27/2016   OSA (obstructive sleep apnea) 09/19/2013   Thrombocytopenia ([place]) 04/14/2011   SCHATZKI'S RING 08/21/2009    REFERRING DIAG: R26.81 (ICD-10-CM) - Unstable gait  THERAPY DIAG:  Difficulty in walking, not elsewhere classified  Muscle weakness (generalized)  Rationale for Evaluation and Treatment Rehabilitation   SUBJECTIVE:    SUBJECTIVE STATEMENT: Pt reports he feels like his strength is a little better. Walking outside has been limited due to hot/humid weather. Pt sees the pulmonalogist later today    PAIN:  Are you having pain? No  PERTINENT HISTORY: lymphoma diagnosed 2018, had stem cell transplant 2019; TKR    PRECAUTIONS: None   WEIGHT BEARING RESTRICTIONS No   FALLS:  Has patient fallen in last 6 months? Yes. Number of falls 1   Tripped over O2 cart   LIVING ENVIRONMENT: Lives with: lives with their family Lives in: House/apartment No issues c accessing home c steps to  mobility within home   OCCUPATION: Corporate treasurer- Desk computer work   PLOF: Independent   PATIENT GOALS : Improve activity tolerance     OBJECTIVE: (objective measures completed at initial evaluation unless otherwise dated)   PATIENT SURVEYS:  FOTO 63% perceived LOF; predicted 73%   COGNITION:           Overall cognitive status: Within functional limits for tasks assessed                          SENSATION: WFL   LOWER EXTREMITY ROM:                       B Legs grossly WNLs   LOWER EXTREMITY MMT:   MMT Right eval Left eval  Hip flexion 4 4  Hip extension 4 4  Hip abduction 4 4  Hip adduction 4 4   Hip internal rotation 4 4  Hip external rotation 4 4  Knee flexion 5 5  Knee extension 5 5  Ankle dorsiflexion 5 5  Ankle plantarflexion 5 5  Ankle inversion      Ankle eversion       (Blank rows = not tested)   FUNCTIONAL TESTS:  5 times sit to stand: 19.1" 2 minute walk test: 370"  O2 sat 96%, HR 112   GAIT: Distance walked: 370" Assistive device utilized: None Level of assistance: Complete Independence Comments: O2 at 3L; somewhat hard exertion ([place])     TODAY'S TREATMENT:  [facility] Adult PT Treatment:                                                DATE: 11/14/21 Therapeutic Exercise: NuStep L6 10 mins. 5 mins O2 sat 95%, HR 106, Fairly light exertion; 10 mins O2 sat 97%, HR 110, Somewhat Hard exertion STS 2x10 Banded side steps 2 x 3f x 8 Shoulder rows 2x10 GTB Shoulder ext 2x10 GTB Chest Press 2x10 Updated HEP  Eval treatment: Instructed in exertion management - conversational ambulation and limit exertion to somewhat hard level   PATIENT EDUCATION:  Education details: Eval findings, POC, HEP Person educated: Patient and Spouse Education method: Explanation, Demonstration, Tactile cues, Verbal cues, and Handouts Education comprehension: verbalized understanding, returned demonstration, verbal cues required, and tactile cues required     HOME EXERCISE PROGRAM: Access Code: [email or web address] URL: [email or web address] Date: 11/14/2021 Prepared by: AGar Ponto Exercises - Sit to Stand Without Arm Support  - 3-4 x daily - 7 x weekly - 1 sets - 5-10 reps - Side Stepping with Resistance at Thighs  - 1-2 x daily - 7 x weekly - 2 sets - 10 reps - Standing Shoulder Row with Anchored Resistance  - 1 x daily - 7 x weekly - 2-3 sets - 10 reps - 2 hold - Shoulder extension with resistance - Neutral  - 1 x daily - 7 x weekly - 2-3 sets - 10 reps - 2 hold - Standing Serratus Punch with Resistance  - 1 x daily - 7 x weekly - 2-3 sets - 10 reps - 2 hold - To walk 2x  per day gradually increasing distance as tolerated and managing per exertion education. Short walks periodically in home.   ASSESSMENT:   CLINICAL IMPRESSION:  Pt participated in PT for activity tolerance and  upper and lower body strength improvement. This was the pt's first visit since the eval. Pt tolerated the session with periodic rest breaks for exertion. With today's therex, pt tolerated with an exertion level of somewhat hard or less. Pt's HEP was updated, see below. Pt will continue to benefit from skilled PT to address deficits to optimize function.   OBJECTIVE IMPAIRMENTS decreased activity tolerance, decreased balance, difficulty walking, and decreased strength.    ACTIVITY LIMITATIONS carrying, lifting, standing, squatting, stairs, transfers, bathing, and locomotion level   PARTICIPATION LIMITATIONS: meal prep, cleaning, laundry, shopping, community activity, and yard work   Excel, Past/current experiences, and Time since onset of injury/illness/exacerbation are also affecting patient's functional outcome.    REHAB POTENTIAL: Good   CLINICAL DECISION MAKING: Stable/uncomplicated   EVALUATION COMPLEXITY: Low     GOALS:   SHORT TERM GOALS: = LTGs      LONG TERM GOALS: Target date: 01/10/22    Pt wil tolerated walking 20 mins or greater in a single attempt for improved tolerance  Baseline: 10 mins Goal status: INITIAL   2.  Pt will demonstrate 4+/5 or greater strength of his legs for improved function Baseline: 4/5 Goal status: INITIAL   3.  Pt's 5xSTS will improve to 14 sec or less as indication of improved function Baseline: 19.1" s use of hands Goal status: INITIAL   4.  2MWT will increase to to 417f as indication of improved strength and activity tolerance Baseline: 370 ft Goal status: INITIAL   5.  Pt's FOTO score will improve to the predicted value of 73% as indication of improved function Baseline: 63% Goal status: INITIAL   6.  Pt  will be ind in a final HEP to maintain achieved LOF Baseline: started on eval Goal status: INITIAL     PLAN: PT FREQUENCY: 1x/week   PT DURATION: 8 weeks   PLANNED INTERVENTIONS: Therapeutic exercises, Therapeutic activity, Neuromuscular re-education, Balance training, Gait training, Patient/Family education, Stair training, Aquatic Therapy, Dry Needling, Electrical stimulation, Cryotherapy, Moist heat, Taping, Ultrasound, Ionotophoresis '4mg'$ /ml Dexamethasone, Manual therapy, and Re-evaluation   PLAN FOR NEXT SESSION: review FOTO; progress therex; use of modalities as indicated   [employer] PT 11/14/2021, 1:50 PM

## 2021-11-14 ENCOUNTER — Ambulatory Visit: Payer: BC Managed Care – PPO

## 2021-11-14 ENCOUNTER — Ambulatory Visit (INDEPENDENT_AMBULATORY_CARE_PROVIDER_SITE_OTHER): Payer: BC Managed Care – PPO | Admitting: Primary Care

## 2021-11-14 ENCOUNTER — Encounter: Payer: Self-pay | Admitting: Primary Care

## 2021-11-14 VITALS — BP 110/62 | HR 95 | Temp 97.6°F | Ht 71.0 in | Wt 178.4 lb

## 2021-11-14 DIAGNOSIS — R262 Difficulty in walking, not elsewhere classified: Secondary | ICD-10-CM | POA: Diagnosis not present

## 2021-11-14 DIAGNOSIS — M6281 Muscle weakness (generalized): Secondary | ICD-10-CM

## 2021-11-14 DIAGNOSIS — J9601 Acute respiratory failure with hypoxia: Secondary | ICD-10-CM

## 2021-11-14 DIAGNOSIS — J189 Pneumonia, unspecified organism: Secondary | ICD-10-CM

## 2021-11-14 LAB — CBC WITH DIFFERENTIAL/PLATELET
Basophils Absolute: 0.1 10*3/uL (ref 0.0–0.1)
Basophils Relative: 0.5 % (ref 0.0–3.0)
Eosinophils Absolute: 0 10*3/uL (ref 0.0–0.7)
Eosinophils Relative: 0.2 % (ref 0.0–5.0)
HCT: 37.5 % — ABNORMAL LOW (ref 39.0–52.0)
Hemoglobin: 12.4 g/dL — ABNORMAL LOW (ref 13.0–17.0)
Lymphocytes Relative: 11.8 % — ABNORMAL LOW (ref 12.0–46.0)
Lymphs Abs: 1.4 10*3/uL (ref 0.7–4.0)
MCHC: 33.1 g/dL (ref 30.0–36.0)
MCV: 94.5 fl (ref 78.0–100.0)
Monocytes Absolute: 2.4 10*3/uL — ABNORMAL HIGH (ref 0.1–1.0)
Monocytes Relative: 21.2 % — ABNORMAL HIGH (ref 3.0–12.0)
Neutro Abs: 7.6 10*3/uL (ref 1.4–7.7)
Neutrophils Relative %: 66.3 % (ref 43.0–77.0)
Platelets: 127 10*3/uL — ABNORMAL LOW (ref 150.0–400.0)
RBC: 3.97 Mil/uL — ABNORMAL LOW (ref 4.22–5.81)
RDW: 15.4 % (ref 11.5–15.5)
WBC: 11.5 10*3/uL — ABNORMAL HIGH (ref 4.0–10.5)

## 2021-11-14 NOTE — Assessment & Plan Note (Signed)
-   Patient ambulated 750 feet today on room air without oxygen desaturation.  Advised he continue to use 1 to 2 L of supplemental oxygen with moderate or heavy exertion.  We will check an overnight oximetry test on room air to see if he still requires nocturnal oxygen.  Hopefully we can discontinue oxygen in the next couple of weeks.  Continue physical therapy at home.

## 2021-11-14 NOTE — Patient Instructions (Addendum)
Glad you are doing better Joseph Powell  Recommendations: - Use Incentive spirometer 10 times an hour 2-3 times a day to help with deep breathing - Take Mucinex 600-'1200mg'$  twice daily as needed for cough/congestion - Slowly increase activity level. Continue with physical therapy  - Recommend wearing supplemental oxygen between 1- 2 L with moderate or heavy exertion.  Continue to wear 2 L of oxygen at night.  It is okay to come off oxygen at rest and with simple activity as long as your oxygen level stays above 88 to 90%.   - We will check an overnight oximetry test on room air to see if we can discontinue oxygen at night - Return to our office between Aug 24-28th for CXR - if pneumonia has not improved or resolved will likely recommend getting CT chest   Orders: - CBC today to monitor WBC (if abnormal to speak with provider on call) - CXR in 1-2 weeks (ordered) - Overnight oximetry test on room air  Follow-up: - Please schedule FU with Dr. Chase Caller in October (if respiratory symptoms were to return or worsen before then please call and schedule visit with me)

## 2021-11-14 NOTE — Progress Notes (Signed)
$'@Patient'm$  ID: Joseph Powell, male    DOB: 1955-05-30, 66 y.o.   MRN: [id number]  Chief Complaint  Patient presents with   Follow-up    Feeling better, sob-better, still coughing-clear    Referring provider: Faustino Congress, NP  HPI: 66 year old male, never smoked.  Past medical history significant for OSA, multifocal pneumonia, cough variant asthma, acute respiratory failure with hypoxia, type 2 diabetes.  Patient of Dr. Melvyn Novas.  Previous [facility] pulmonary encounter: 10/31/2021 Patient presents today for hospital follow-up.  Patient was admitted from 10/16/2021 - 10/24/2021 for acute hypoxic respiratory failure secondary to bilateral pneumonia from parainfluenza.  He was on cruise in [place] when he got sick. Chest x-ray showed background pattern of scattered pulmonary scarring, superimposed acute bronchial thickening and patchy pulmonary infiltrates in the mid to lower lungs consistent with bronchopneumonia.  Blood cultures positive for haemophilus influenzae PNA.  Sputum culture rare respiratory flora, Legionella antigen negative, strep pneumonia negative.  ID consulted.  He was started on Rocephin which was transitioned to Augmentin.  Patient completed 7-day course of antibiotics per ID.  He initially required high flow nasal cannula oxygen 25 L 50% on admission.  Treated with BiPAP during hospitalization.  Echocardiogram showed normal EF, low normal RV function in the setting of pneumonia.  He received 1 dose of Lasix.  Chest x-ray showed improvement. Patient was discharged on oxygen.  Accompanied by his wife. He was doing better until recently.He has been coughing a lot more the last day or two. Cough is mainly dry, only productive in the morning. Sputum is slight yellow color. He feels short of breath when he is off oxygen. He is on 3L oxygen, desaturates when coughing. He is taking tesslon perles and delsym for cough. He has lost some weight since his vacation. He is eating and drinking  fluifs.  He completed 5 day course of prednisone.   11/14/2021 - Interim hx  Patient presents today for 2 week follow-up PNA. He was seen last seen in our office on 10/31/21 for hospital follow-up d/t respiratory failure secondary to bilateral pneumonia from parainfluenza. He appeared somewhat unwell during that visit and chest x-ray showed mild progression of bibasilar infiltrates. He was started on course oral Levaquin and prednisone.  White blood cell count came back at critical result of 41.  We advised that patient be evaluated in the emergency room.  Repeat CBC on 6/30 showed WBC improved to 16.6.  Lactic acid was 2.1.  Blood cultures were negative.   He is feeling better today. Accompanied by his wife.  Patient still has a cough which has recently become loose with clear mucus.  Shortness of breath has improved.  He is no longer wheezing.  He is able to take deeper breaths. Completed Levaquin and prednisone.  He was taking Mucinex up until yesterday. Using 3L oxygen. He is going short periods without oxygen. O2 running between 94-95%.  Weight is stable. Eating and drinking ok. Energy level is improving. Getting physical therapy once a week. He works from home. No fevers, chills or sweats.    No Known Allergies  Immunization History  Administered Date(s) Administered   DTaP / IPV 02/18/2018, 05/11/2018, 09/09/2018   Fluad Quad(high Dose 65+) 01/31/2021   Hepatitis B, ped/adol 02/18/2018, 05/11/2018, 12/09/2018   HiB (PRP-T) 02/18/2018, 05/11/2018, 09/09/2018   Influenza Split 04/01/2011   Influenza, High Dose Seasonal PF 04/05/2018, 04/07/2019   Influenza,inj,quad, With Preservative 02/03/2019   Janssen (J&J) SARS-COV-2 Vaccination 08/11/2019   MMR 09/09/2019  Pneumococcal Conjugate-13 02/18/2018, 05/11/2018, 09/09/2018   Pneumococcal Polysaccharide-23 05/05/2010, 12/09/2018   Tdap 08/25/2013, 09/09/2018   Zoster Recombinat (Shingrix) 02/12/2017, 02/07/2019, 04/04/2019   Zoster, Live  01/23/2017, 01/18/2019, 04/04/2019    Past Medical History:  Diagnosis Date   Allergic rhinitis    Asthma    slight   Bell's palsy    GERD (gastroesophageal reflux disease)    Gout    Hypercholesteremia    Melanoma ([place])    Osteoarthritis    Sleep apnea    Resolving   Thrombocytopenia (HCC)    Tubular adenoma     Tobacco History: Social History   Tobacco Use  Smoking Status Never   Passive exposure: Past  Smokeless Tobacco Never   Counseling given: Not Answered   Outpatient Medications Prior to Visit  Medication Sig Dispense Refill   amLODipine (NORVASC) 5 MG tablet Take 1 tablet (5 mg total) by mouth daily. (Patient taking differently: Take 5 mg by mouth daily. Taking 1/2 tablet daily) 30 tablet 0   benzonatate (TESSALON) 200 MG capsule Take 1 capsule (200 mg total) by mouth 3 (three) times daily as needed for cough. 60 capsule 3   CALCIUM PO Take by mouth.     carvedilol (COREG) 3.125 MG tablet Take 1 tablet (3.125 mg total) by mouth 2 (two) times daily with a meal. PATIENT MUST SCHEDULE ANNUAL APPOINTMENT FOR FUTURE REFILLS. FIRST ATTEMPT 30 tablet 0   chlorpheniramine (CHLOR-TRIMETON) 4 MG tablet Take 4 mg by mouth 2 (two) times daily as needed for allergies.     Cyanocobalamin (VITAMIN B-12 PO) Take by mouth.     Vitamin D, Ergocalciferol, (DRISDOL) 1.25 MG (50000 UNIT) CAPS capsule Take 1 capsule by mouth once a week 12 capsule 0   allopurinol (ZYLOPRIM) 300 MG tablet 1 tablet (Patient not taking: Reported on 11/14/2021)     levofloxacin (LEVAQUIN) 500 MG tablet Take 1 tablet (500 mg total) by mouth daily. (Patient not taking: Reported on 11/14/2021) 7 tablet 0   predniSONE (DELTASONE) 10 MG tablet 4 tabs for 2 days, then 3 tabs for 2 days, 2 tabs for 2 days, then 1 tab for 2 days, then stop (Patient not taking: Reported on 11/14/2021) 20 tablet 0   No facility-administered medications prior to visit.    Review of Systems  Review of Systems  Constitutional:  Negative.   HENT: Negative.    Respiratory:  Positive for cough. Negative for chest tightness, shortness of breath and wheezing.   Cardiovascular: Negative.    Physical Exam  BP 110/62 (BP Location: Right Arm, Cuff Size: Normal)   Pulse 95   Temp 97.6 F (36.4 C) (Temporal)   Ht $R'5\' 11"'JV$  (1.803 m)   Wt 178 lb 6.4 oz (80.9 kg)   SpO2 97%   BMI 24.88 kg/m  Physical Exam Constitutional:      General: He is not in acute distress.    Appearance: Normal appearance. He is not ill-appearing or toxic-appearing.  HENT:     Head: Normocephalic and atraumatic.     Right Ear: Tympanic membrane normal. There is no impacted cerumen.     Left Ear: Tympanic membrane normal. There is no impacted cerumen.     Mouth/Throat:     Mouth: Mucous membranes are moist.     Pharynx: Oropharynx is clear.  Cardiovascular:     Rate and Rhythm: Normal rate and regular rhythm.  Pulmonary:     Effort: Pulmonary effort is normal. No respiratory distress.  Breath sounds: Normal breath sounds. No wheezing or rhonchi.     Comments: Isolated rales bilateral bases Musculoskeletal:        General: Normal range of motion.  Skin:    General: Skin is warm and dry.  Neurological:     General: No focal deficit present.     Mental Status: He is alert and oriented to person, place, and time. Mental status is at baseline.  Psychiatric:        Mood and Affect: Mood normal.        Behavior: Behavior normal.        Thought Content: Thought content normal.        Judgment: Judgment normal.      Lab Results:  CBC    Component Value Date/Time   WBC 16.6 (H) 11/01/2021 1946   RBC 4.02 (L) 11/01/2021 1946   HGB 12.5 (L) 11/01/2021 1946   HGB 10.6 (L) 10/15/2017 0955   HGB 8.6 (L) 05/01/2017 0815   HCT 37.1 (L) 11/01/2021 1946   HCT 27.6 (L) 05/01/2017 0815   PLT 87 (L) 11/01/2021 1946   PLT 51 (L) 10/15/2017 0955   PLT 30 (L) 05/01/2017 0815   MCV 92.3 11/01/2021 1946   MCV 87.3 05/01/2017 0815   MCH 31.1  11/01/2021 1946   MCHC 33.7 11/01/2021 1946   RDW 14.1 11/01/2021 1946   RDW 18.4 (H) 05/01/2017 0815   LYMPHSABS 0.7 11/01/2021 1946   LYMPHSABS 1.0 05/01/2017 0815   MONOABS 1.2 (H) 11/01/2021 1946   MONOABS 1.8 (H) 05/01/2017 0815   EOSABS 0.1 11/01/2021 1946   EOSABS 0.0 05/01/2017 0815   BASOSABS 0.0 11/01/2021 1946   BASOSABS 0.3 (H) 05/01/2017 0815    BMET    Component Value Date/Time   NA 133 (L) 11/01/2021 1946   NA 137 05/01/2017 0815   K 4.5 11/01/2021 1946   K 3.6 05/01/2017 0815   CL 95 (L) 11/01/2021 1946   CO2 25 11/01/2021 1946   CO2 26 05/01/2017 0815   GLUCOSE 335 (H) 11/01/2021 1946   GLUCOSE 223 (H) 05/01/2017 0815   BUN 17 11/01/2021 1946   BUN 5.6 (L) 05/01/2017 0815   CREATININE 0.80 11/01/2021 1946   CREATININE 0.90 06/28/2021 1129   CREATININE 0.7 05/01/2017 0815   CALCIUM 10.0 11/01/2021 1946   CALCIUM 8.8 05/01/2017 0815   GFRNONAA >60 11/01/2021 1946   GFRNONAA >60 06/28/2021 1129   GFRAA >60 01/25/2020 1431    BNP    Component Value Date/Time   BNP 372.0 (H) 10/17/2021 0507    ProBNP    Component Value Date/Time   PROBNP 120.0 (H) 10/31/2021 1336    Imaging: DG Chest Port 1 View  Result Date: 11/01/2021 CLINICAL DATA:  [id number]. pt sts that he seen pulmonology and they called stating WBC was abnormally high. Sts that he was dx with PNA and been wearing 3L [place] since then. Pt sts he feels better today than recently. EXAM: PORTABLE CHEST 1 VIEW COMPARISON:  Chest x-ray 10/31/2021, CT chest 10/17/2021 FINDINGS: The heart and mediastinal contours are unchanged. Aortic calcification. Persistent stable to slightly worsened left lower lung zone patchy airspace opacity. No pulmonary edema. No pleural effusion. No pneumothorax. No acute osseous abnormality. IMPRESSION: 1. Stable to slightly worsened left lower lung zone patchy airspace opacity. Followup PA and lateral chest X-ray is recommended in 3-4 weeks following therapy to ensure resolution  and exclude underlying malignancy. 2.  Aortic Atherosclerosis (ICD10-I70.0). Electronically Signed  By: Iven Finn M.D.   On: 11/01/2021 20:57   DG Chest 2 View  Result Date: 10/31/2021 CLINICAL DATA:  Follow-up pneumonia.  Persistent cough EXAM: CHEST - 2 VIEW COMPARISON:  10/24/2021 FINDINGS: Improvement in right upper lobe airspace disease. Mild progression of bibasilar airspace disease.  No effusion. Heart size and vascularity normal. IMPRESSION: Mild progression of bibasilar infiltrates which may represent pneumonia. Electronically Signed   By: Franchot Gallo M.D.   On: 10/31/2021 12:02   DG CHEST PORT 1 VIEW  Result Date: 10/24/2021 CLINICAL DATA:  Community acquired pneumonia. CTA chest 10/17/2021 and two-view chest x-ray 10/21/2021. EXAM: PORTABLE CHEST 1 VIEW COMPARISON:  CT a chest 10/17/2021 and two-view chest x-ray 10/21/2021. FINDINGS: Heart size normal. Atherosclerotic calcifications are present at the aortic arch. Persistent right upper lobe airspace disease is stable. Left lower lobe airspace disease has improved slightly. No new airspace disease is present. Dilated loops of small bowel are noted in the upper abdomen. No free air. IMPRESSION: 1. Persistent right upper lobe airspace disease. 2. Slight improvement in left lower lobe airspace disease. 3. No new airspace disease. 4. Dilated loops of small bowel in the upper abdomen. Electronically Signed   By: San Morelle M.D.   On: 10/24/2021 12:30   DG Chest 2 View  Result Date: 10/21/2021 CLINICAL DATA:  Shortness of breath.  Pneumonia and sepsis. EXAM: CHEST - 2 VIEW COMPARISON:  10/17/2021 FINDINGS: Normal heart size and mediastinal contours. No signs of pleural effusion. Bilateral areas of subsegmental atelectasis noted. Interval improvement in bilateral lower lung zone predominant reticular and nodular pulmonary opacities. Residual opacities are noted within the retrocardiac left lower lobe and right middle lobe.  IMPRESSION: Improved aeration to both lungs with residual left base and right midlung airspace opacities. Electronically Signed   By: Kerby Moors M.D.   On: 10/21/2021 14:51   VAS [place] LOWER EXTREMITY VENOUS (DVT)  Result Date: 10/19/2021  Lower Venous DVT Study Patient Name:  PARSA RICKETT  Date of Exam:   10/18/2021 Medical Rec #: [id number]          Accession #:    [id number] Date of Birth: [birth year]          Patient Gender: M Patient Age:   14 years Exam Location:  [facility] Procedure:      VAS [place] LOWER EXTREMITY VENOUS (DVT) Referring Phys: Jerald Kief REGALADO --------------------------------------------------------------------------------  Indications: Edema.  Risk Factors: None identified. Comparison Study: No prior studies. Performing Technologist: Oliver Hum RVT  Examination Guidelines: A complete evaluation includes B-mode imaging, spectral Doppler, color Doppler, and power Doppler as needed of all accessible portions of each vessel. Bilateral testing is considered an integral part of a complete examination. Limited examinations for reoccurring indications may be performed as noted. The reflux portion of the exam is performed with the patient in reverse Trendelenburg.  +---------+---------------+---------+-----------+----------+--------------+ RIGHT    CompressibilityPhasicitySpontaneityPropertiesThrombus Aging +---------+---------------+---------+-----------+----------+--------------+ CFV      Full           Yes      Yes                                 +---------+---------------+---------+-----------+----------+--------------+ SFJ      Full                                                        +---------+---------------+---------+-----------+----------+--------------+  FV Prox  Full                                                        +---------+---------------+---------+-----------+----------+--------------+ FV Mid   Full                                                         +---------+---------------+---------+-----------+----------+--------------+ FV DistalFull                                                        +---------+---------------+---------+-----------+----------+--------------+ PFV      Full                                                        +---------+---------------+---------+-----------+----------+--------------+ POP      Full           Yes      Yes                                 +---------+---------------+---------+-----------+----------+--------------+ PTV      Full                                                        +---------+---------------+---------+-----------+----------+--------------+ PERO     Full                                                        +---------+---------------+---------+-----------+----------+--------------+   +---------+---------------+---------+-----------+----------+--------------+ LEFT     CompressibilityPhasicitySpontaneityPropertiesThrombus Aging +---------+---------------+---------+-----------+----------+--------------+ CFV      Full           Yes      Yes                                 +---------+---------------+---------+-----------+----------+--------------+ SFJ      Full                                                        +---------+---------------+---------+-----------+----------+--------------+ FV Prox  Full                                                        +---------+---------------+---------+-----------+----------+--------------+  FV Mid   Full                                                        +---------+---------------+---------+-----------+----------+--------------+ FV DistalFull                                                        +---------+---------------+---------+-----------+----------+--------------+ PFV      Full                                                         +---------+---------------+---------+-----------+----------+--------------+ POP      Full           Yes      Yes                                 +---------+---------------+---------+-----------+----------+--------------+ PTV      Full                                                        +---------+---------------+---------+-----------+----------+--------------+ PERO     Full                                                        +---------+---------------+---------+-----------+----------+--------------+     Summary: RIGHT: - There is no evidence of deep vein thrombosis in the lower extremity.  - No cystic structure found in the popliteal fossa.  LEFT: - There is no evidence of deep vein thrombosis in the lower extremity.  - No cystic structure found in the popliteal fossa.  *See table(s) above for measurements and observations. Electronically signed by Jamelle Haring on 10/19/2021 at 12:09:04 PM.    Final    ECHOCARDIOGRAM COMPLETE  Result Date: 10/17/2021    ECHOCARDIOGRAM REPORT   Patient Name:   SKIPPER DACOSTA Date of Exam: 10/17/2021 Medical Rec #:  [id number]         Height:       71.0 in Accession #:    [id number]        Weight:       187.0 lb Date of Birth:  Dec 23, 1955         BSA:          2.049 m Patient Age:    40 years          BP:           160/87 mmHg Patient Gender: M                 HR:           87 bpm. Exam Location:  Inpatient Procedure: 2D Echo, Cardiac Doppler and Color Doppler Indications:    Acute respiratory distress  History:        Patient has prior history of Echocardiogram examinations, most                 recent 11/29/2019. Risk Factors:Diabetes.  Sonographer:    Jefferey Pica Referring Phys: [phone] MURALI RAMASWAMY  Sonographer Comments: Image acquisition challenging due to respiratory motion. IMPRESSIONS  1. Left ventricular ejection fraction, by estimation, is 60 to 65%. The left ventricle has normal function. The left ventricle has no regional wall motion  abnormalities. There is mild concentric left ventricular hypertrophy. Left ventricular diastolic parameters were normal.  2. Right ventricular systolic function is low normal. The right ventricular size is mildly enlarged.  3. The mitral valve is normal in structure. No evidence of mitral valve regurgitation. No evidence of mitral stenosis.  4. The aortic valve is normal in structure. Aortic valve regurgitation is not visualized. No aortic stenosis is present.  5. The inferior vena cava is normal in size with greater than 50% respiratory variability, suggesting right atrial pressure of 3 mmHg. FINDINGS  Left Ventricle: Left ventricular ejection fraction, by estimation, is 60 to 65%. The left ventricle has normal function. The left ventricle has no regional wall motion abnormalities. The left ventricular internal cavity size was normal in size. There is  mild concentric left ventricular hypertrophy. Left ventricular diastolic parameters were normal. Right Ventricle: The right ventricular size is mildly enlarged. No increase in right ventricular wall thickness. Right ventricular systolic function is low normal. Left Atrium: Left atrial size was normal in size. Right Atrium: Right atrial size was normal in size. Pericardium: There is no evidence of pericardial effusion. Presence of epicardial fat layer. Mitral Valve: The mitral valve is normal in structure. No evidence of mitral valve regurgitation. No evidence of mitral valve stenosis. Tricuspid Valve: The tricuspid valve is normal in structure. Tricuspid valve regurgitation is not demonstrated. No evidence of tricuspid stenosis. Aortic Valve: The aortic valve is normal in structure. Aortic valve regurgitation is not visualized. No aortic stenosis is present. Aortic valve peak gradient measures 8.5 mmHg. Pulmonic Valve: The pulmonic valve was normal in structure. Pulmonic valve regurgitation is not visualized. No evidence of pulmonic stenosis. Aorta: The aortic root  is normal in size and structure. Venous: The inferior vena cava is normal in size with greater than 50% respiratory variability, suggesting right atrial pressure of 3 mmHg. IAS/Shunts: No atrial level shunt detected by color flow Doppler.  LEFT VENTRICLE PLAX 2D LVIDd:         4.20 cm   Diastology LVIDs:         2.30 cm   LV e' medial:    10.10 cm/s LV PW:         1.00 cm   LV E/e' medial:  5.8 LV IVS:        1.10 cm   LV e' lateral:   12.40 cm/s LVOT diam:     2.00 cm   LV E/e' lateral: 4.7 LV SV:         79 LV SV Index:   39 LVOT Area:     3.14 cm  RIGHT VENTRICLE RV Basal diam:  3.90 cm RV Mid diam:    4.30 cm LEFT ATRIUM             Index        RIGHT ATRIUM  Index LA diam:        4.90 cm 2.39 cm/m   RA Area:     16.50 cm LA Vol (A2C):   39.4 ml 19.23 ml/m  RA Volume:   42.30 ml  20.64 ml/m LA Vol (A4C):   50.3 ml 24.55 ml/m LA Biplane Vol: 44.8 ml 21.86 ml/m  AORTIC VALVE                 PULMONIC VALVE AV Area (Vmax): 2.71 cm     PV Vmax:       1.13 m/s AV Vmax:        146.00 cm/s  PV Peak grad:  5.1 mmHg AV Peak Grad:   8.5 mmHg LVOT Vmax:      126.00 cm/s LVOT Vmean:     77.400 cm/s LVOT VTI:       0.253 m  AORTA Ao Root diam: 3.60 cm Ao Asc diam:  3.50 cm MITRAL VALVE MV Area (PHT): 3.91 cm    SHUNTS MV Decel Time: 194 msec    Systemic VTI:  0.25 m MV E velocity: 58.20 cm/s  Systemic Diam: 2.00 cm MV A velocity: 78.10 cm/s MV E/A ratio:  0.75 Kardie Tobb DO Electronically signed by Berniece Salines DO Signature Date/Time: 10/17/2021/3:21:08 PM    Final    CT Angio Chest Pulmonary Embolism (PE) W or WO Contrast  Result Date: 10/17/2021 CLINICAL DATA:  Chest pain or SOB, pleurisy or effusion suspected EXAM: CT ANGIOGRAPHY CHEST WITH CONTRAST TECHNIQUE: Multidetector CT imaging of the chest was performed using the standard protocol during bolus administration of intravenous contrast. Multiplanar CT image reconstructions and MIPs were obtained to evaluate the vascular anatomy. RADIATION DOSE  REDUCTION: This exam was performed according to the departmental dose-optimization program which includes automated exposure control, adjustment of the mA and/or kV according to patient size and/or use of iterative reconstruction technique. CONTRAST:  58mL OMNIPAQUE IOHEXOL 350 MG/ML SOLN COMPARISON:  Correlation is made with a chest x-ray dated October 16, 2021 FINDINGS: Cardiovascular: Satisfactory opacification of the pulmonary arteries to the segmental level. No evidence of pulmonary embolism. Normal heart size. No pericardial effusion. Thoracic aorta has a normal appearance. Mild atheromatous calcifications of the arch of the aorta and coronary arteries. Mediastinum/Nodes: No enlarged mediastinal, hilar, or axillary lymph nodes. Thyroid gland, trachea, and esophagus demonstrate no significant findings. Moderate thickening of the distal esophagus. Lungs/Pleura: There are patchy confluent opacities seen at the bilateral mid and lower lung zones and are more prominent at the bilateral lower lobes. There is some consolidation seen at the left lung base. No pleural effusion. Upper Abdomen: No acute abnormality. Musculoskeletal: Mild-to-moderate thoracic spondylosis with prominent anterior marginal osteophytes. Review of the MIP images confirms the above findings. IMPRESSION: There are patchy confluent opacities seen at the bilateral mid and lower lung zones with an area of consolidation at the left lung base. These findings may represent pneumonia with superimposed atelectasis. No pleural effusion. Moderate circumferential thickening of the distal esophagus. Electronically Signed   By: Frazier Richards M.D.   On: 10/17/2021 10:41   DG Chest Port 1 View  Result Date: 10/16/2021 CLINICAL DATA:  Productive cough and shortness of breath over the last 2 weeks. EXAM: PORTABLE CHEST 1 VIEW COMPARISON:  07/02/2021 FINDINGS: Heart size is normal. Aortic atherosclerosis is noted. There is bronchial thickening and pulmonary  scarring, but with patchy acute infiltrates in the mid and lower lungs bilaterally. No dense consolidation or lobar collapse. No effusion. No  abnormal bone finding. IMPRESSION: Background pattern of scattered pulmonary scarring. Superimposed acute bronchial thickening and patchy pulmonary infiltrates in the mid and lower lungs consistent with bronchopneumonia. Electronically Signed   By: Nelson Chimes M.D.   On: 10/16/2021 08:28     Assessment & Plan:   Multifocal pneumonia - Clinically improving.  Patient was originally hospitalized mid June 2023 for acute hypoxic respiratory failure secondary to bilateral pneumonia from parainfluenza.  During his hospital follow-up on June 29th his clinical symptoms appeared worse.  He had mild progression of bilateral  Infiltrates on chest x-ray with significant leukocytosis. Completed 7 day course of oral Levaquin and prednisone taper. Shortness of breath is better. Continues to have productive cough with clear mucus. Oxygen requirements are improving. He had some isolated rales on exam. Advised he use Incentive spirometer 10x/hr 2-4 times a day. Take mucinex 600-$RemoveBeforeDE'1200mg'[id number]$  twice daily as needed for cough/loosen congestion.  We will get CBC with diff today to monitor WBC and repeat CXR in 1-2 weeks. If pneumonia not improving or resolved on imaging needs CT chest wo contrast.   Acute respiratory failure with hypoxia ([place]) - Patient ambulated 750 feet today on room air without oxygen desaturation.  Advised he continue to use 1 to 2 L of supplemental oxygen with moderate or heavy exertion.  We will check an overnight oximetry test on room air to see if he still requires nocturnal oxygen.  Hopefully we can discontinue oxygen in the next couple of weeks.  Continue physical therapy at home.   Martyn Ehrich, NP 11/14/2021

## 2021-11-14 NOTE — Addendum Note (Signed)
Addended by: Martyn Ehrich on: 11/14/2021 12:48 PM   Modules accepted: Orders

## 2021-11-14 NOTE — Assessment & Plan Note (Addendum)
-   Clinically improving.  Patient was originally hospitalized mid June 2023 for acute hypoxic respiratory failure secondary to bilateral pneumonia from parainfluenza.  During his hospital follow-up on June 29th his clinical symptoms appeared worse.  He had mild progression of bilateral  Infiltrates on chest x-ray with significant leukocytosis. Completed 7 day course of oral Levaquin and prednisone taper. Shortness of breath is better. Continues to have productive cough with clear mucus. Oxygen requirements are improving. He had some isolated rales on exam. Advised he use Incentive spirometer 10x/hr 2-4 times a day. Take mucinex 600-'1200mg'$  twice daily as needed for cough/loosen congestion.  We will get CBC with diff today to monitor WBC and repeat CXR in 1-2 weeks. If pneumonia not improving or resolved on imaging needs CT chest wo contrast.

## 2021-11-18 DIAGNOSIS — H906 Mixed conductive and sensorineural hearing loss, bilateral: Secondary | ICD-10-CM | POA: Diagnosis not present

## 2021-11-19 ENCOUNTER — Telehealth: Payer: Self-pay | Admitting: Primary Care

## 2021-11-19 NOTE — Telephone Encounter (Signed)
Patient called [employer] and they state they do not have an order for the pulse ox.   Please advise, call number is [phone].

## 2021-11-20 DIAGNOSIS — H6983 Other specified disorders of Eustachian tube, bilateral: Secondary | ICD-10-CM | POA: Diagnosis not present

## 2021-11-20 DIAGNOSIS — H906 Mixed conductive and sensorineural hearing loss, bilateral: Secondary | ICD-10-CM | POA: Diagnosis not present

## 2021-11-20 DIAGNOSIS — H6523 Chronic serous otitis media, bilateral: Secondary | ICD-10-CM | POA: Diagnosis not present

## 2021-11-20 NOTE — Telephone Encounter (Signed)
Called patient and informed him that the order was sent on on 7/13 and it can take 2-4 business days before the order is received and processed. Order for Joseph Powell was sent out on 7/13. Nothing further needed

## 2021-11-21 ENCOUNTER — Ambulatory Visit: Payer: BC Managed Care – PPO

## 2021-11-21 VITALS — HR 82

## 2021-11-21 DIAGNOSIS — M6281 Muscle weakness (generalized): Secondary | ICD-10-CM | POA: Diagnosis not present

## 2021-11-21 DIAGNOSIS — R262 Difficulty in walking, not elsewhere classified: Secondary | ICD-10-CM | POA: Diagnosis not present

## 2021-11-21 DIAGNOSIS — Z96652 Presence of left artificial knee joint: Secondary | ICD-10-CM | POA: Diagnosis not present

## 2021-11-21 DIAGNOSIS — Z471 Aftercare following joint replacement surgery: Secondary | ICD-10-CM | POA: Diagnosis not present

## 2021-11-21 NOTE — Therapy (Signed)
OUTPATIENT PHYSICAL THERAPY TREATMENT NOTE/Discharge   Patient Name: Joseph Powell MRN: [id number] DOB:1956-01-23, 66 y.o., male Today's Date: 11/21/2021  PCP: Faustino Congress, NP REFERRING PROVIDER: Elmarie Shiley, MD  END OF SESSION:   PT End of Session - 11/21/21 1522     Visit Number 3    Number of Visits 9    Date for PT Re-Evaluation 01/10/22    Authorization Type [employer] COMM PPO    PT Start Time 1515    PT Stop Time 1600    PT Time Calculation (min) 45 min    Activity Tolerance Patient tolerated treatment well    Behavior During Therapy WFL for tasks assessed/performed              Past Medical History:  Diagnosis Date   Allergic rhinitis    Asthma    slight   Bell's palsy    GERD (gastroesophageal reflux disease)    Gout    Hypercholesteremia    Melanoma ([place])    Osteoarthritis    Sleep apnea    Resolving   Thrombocytopenia ([place])    Tubular adenoma    Past Surgical History:  Procedure Laterality Date   IR FLUORO GUIDE PORT INSERTION RIGHT  02/17/2017   IR REMOVAL TUN ACCESS W/ PORT W/O FL MOD SED  01/05/2018   IR US GUIDE VASC ACCESS RIGHT  02/17/2017   [place]     NASAL RECONSTRUCTION     VASECTOMY  1999   Patient Active Problem List   Diagnosis Date Noted   Elevated blood pressure reading 10/31/2021   Multifocal pneumonia    Non-recurrent acute suppurative otitis media of both ears without spontaneous rupture of tympanic membranes    Haemophilus infection    Penicillin allergy    Malnutrition of moderate degree 10/18/2021   Pneumonia of both lungs due to infectious organism 10/17/2021   Hypokalemia 10/17/2021   Acute respiratory failure with hypoxia ([place]) 10/17/2021   Sepsis due to pneumonia ([place]) 10/16/2021   Abdominal pain 07/22/2021   Type 2 diabetes mellitus without complication, without long-term current use of insulin ([place]) 03/05/2021   Mixed hyperlipidemia 03/05/2021   Encounter for general  adult medical examination with abnormal findings 03/05/2021   Non-seasonal allergic rhinitis due to pollen 03/05/2021   Cough variant asthma vs UACS 01/27/2017   Mild asthma 08/05/2016   DJD (degenerative joint disease) 08/05/2016   HLD (hyperlipidemia) 07/27/2016   GERD without esophagitis 07/27/2016   Gout 07/27/2016   OSA (obstructive sleep apnea) 09/19/2013   Thrombocytopenia ([place]) 04/14/2011   SCHATZKI'S RING 08/21/2009    REFERRING DIAG: R26.81 (ICD-10-CM) - Unstable gait  THERAPY DIAG:  Difficulty in walking, not elsewhere classified  Muscle weakness (generalized)  Rationale for Evaluation and Treatment Rehabilitation   SUBJECTIVE:    SUBJECTIVE STATEMENT: Pt reports the O2 was Dced to use only at night and the O2 decreased to 1.5 L. Pt notes he is is pleased with his progress and feels he will to continue to improve. Pt is walking in the evenings for exercise.    PAIN:  Are you having pain? No  PERTINENT HISTORY: lymphoma diagnosed 2018, had stem cell transplant 2019; TKR    PRECAUTIONS: None   WEIGHT BEARING RESTRICTIONS No   FALLS:  Has patient fallen in last 6 months? Yes. Number of falls 1   Tripped over O2 cart   LIVING ENVIRONMENT: Lives with: lives with their family Lives in: House/apartment  No issues c accessing home c steps to mobility within home   OCCUPATION: Corporate treasurer- Desk computer work   PLOF: Independent   PATIENT GOALS : Improve activity tolerance     OBJECTIVE: (objective measures completed at initial evaluation unless otherwise dated)   PATIENT SURVEYS:  FOTO 63% perceived LOF; predicted 73%   COGNITION:           Overall cognitive status: Within functional limits for tasks assessed                          SENSATION: WFL   LOWER EXTREMITY ROM:                       B Legs grossly WNLs   LOWER EXTREMITY MMT:   MMT Right eval Left eval  Hip flexion 4 4  Hip extension 4 4  Hip abduction 4 4  Hip adduction 4 4   Hip internal rotation 4 4  Hip external rotation 4 4  Knee flexion 5 5  Knee extension 5 5  Ankle dorsiflexion 5 5  Ankle plantarflexion 5 5  Ankle inversion      Ankle eversion       (Blank rows = not tested)   FUNCTIONAL TESTS:  5 times sit to stand: 19.1" 11/21/21=10.4" 2 minute walk test: 370"  O2 sat 96%, HR 112 11/21/21: 663f   GAIT: Distance walked: 370" Assistive device utilized: None Level of assistance: Complete Independence Comments: O2 at 3L; somewhat hard exertion ([place])     TODAY'S TREATMENT: [facility] Adult PT Treatment:                                                DATE: 11/21/21 Therapeutic Exercise: NuStep L6 10 mins. 5 mins O2 sat 95%, HR 102, Fairly light exertion; 10 mins O2 sat 95%, HR 88, Fairly light exertion Omega knee ext 25# 2x10 Omega knee flex 35# 2x10 Chest press 35# 2x10 Scapular pull 35# 2x10 Lat pull 25# 2x10 Leg press 55# 2x10 5xSTS 2MWT   [facility] Adult PT Treatment:                                                DATE: 11/14/21 Therapeutic Exercise: NuStep L6 10 mins. 5 mins O2 sat 95%, HR 106, Fairly light exertion; 10 mins O2 sat 97%, HR 110, Somewhat Hard exertion STS 2x10 Banded side steps 2 x 832fx 8 Shoulder rows 2x10 GTB Shoulder ext 2x10 GTB Chest Press 2x10 Updated HEP  Eval treatment: Instructed in exertion management - conversational ambulation and limit exertion to somewhat hard level   PATIENT EDUCATION:  Education details: Eval findings, POC, HEP Person educated: Patient and Spouse Education method: Explanation, Demonstration, Tactile cues, Verbal cues, and Handouts Education comprehension: verbalized understanding, returned demonstration, verbal cues required, and tactile cues required     HOME EXERCISE PROGRAM: Access Code: [email or web address] URL: [email or web address] Date: 11/14/2021 Prepared by: AlGar PontoExercises - Sit to Stand Without Arm Support  - 3-4 x daily - 7 x weekly - 1 sets - 5-10 reps -  Side Stepping with Resistance at Thighs  - 1-2 x daily - 7  x weekly - 2 sets - 10 reps - Standing Shoulder Row with Anchored Resistance  - 1 x daily - 7 x weekly - 2-3 sets - 10 reps - 2 hold - Shoulder extension with resistance - Neutral  - 1 x daily - 7 x weekly - 2-3 sets - 10 reps - 2 hold - Standing Serratus Punch with Resistance  - 1 x daily - 7 x weekly - 2-3 sets - 10 reps - 2 hold - To walk 2x per day gradually increasing distance as tolerated and managing per exertion education. Short walks periodically in home.   ASSESSMENT:   CLINICAL IMPRESSION: Since the last PT session, pt has made good progress with activity tolerance and strength. Pt has met all PT goals, is Ind in a HEP, and feels he will continue to improve. PT today focused on strengthening therex with machines the pt will encounter at his local fitness club.    OBJECTIVE IMPAIRMENTS decreased activity tolerance, decreased balance, difficulty walking, and decreased strength.    ACTIVITY LIMITATIONS carrying, lifting, standing, squatting, stairs, transfers, bathing, and locomotion level   PARTICIPATION LIMITATIONS: meal prep, cleaning, laundry, shopping, community activity, and yard work   PERSONAL FACTORS Fitness, Past/current experiences, and Time since onset of injury/illness/exacerbation are also affecting patient's functional outcome.    REHAB POTENTIAL: Good   CLINICAL DECISION MAKING: Stable/uncomplicated   EVALUATION COMPLEXITY: Low     GOALS:   SHORT TERM GOALS: = LTGs      LONG TERM GOALS: Target date: 01/10/22    Pt wil tolerated walking 20 mins or greater in a single attempt for improved tolerance  Baseline: 10 mins Goal status: MET   2.  Pt will demonstrate 4+/5 or greater strength of his legs for improved function Baseline: 4/5 Status: 4+/5 Goal status: MET   3.  Pt's 5xSTS will improve to 14 sec or less as indication of improved function Baseline: 19.1" s use of hands Status: 10.4 " Goal  status: MET   4.  2MWT will increase to to 470ft as indication of improved strength and activity tolerance Baseline: 370 ft Goal status: INITIAL   5.  Pt's FOTO score will improve to the predicted value of 73% as indication of improved function Baseline: 63% Status: 78% Goal status: INITIAL   6.  Pt will be ind in a final HEP to maintain achieved LOF Baseline: started on eval Goal status: MET     PLAN: PT FREQUENCY: 1x/week   PT DURATION: 8 weeks   PLANNED INTERVENTIONS: Therapeutic exercises, Therapeutic activity, Neuromuscular re-education, Balance training, Gait training, Patient/Family education, Stair training, Aquatic Therapy, Dry Needling, Electrical stimulation, Cryotherapy, Moist heat, Taping, Ultrasound, Ionotophoresis 4mg/ml Dexamethasone, Manual therapy, and Re-evaluation   PLAN FOR NEXT SESSION:   PHYSICAL THERAPY DISCHARGE SUMMARY  Visits from Start of Care: 3  Current functional level related to goals / functional outcomes: See clinical impression and goals   Remaining deficits: See clinical impression and goals   Education / Equipment: HEP   Patient agrees to discharge. Patient goals were met. Patient is being discharged due to being pleased with the current functional level.    Allen Ralls MS, PT 11/21/21 4:25 PM              

## 2021-11-23 DIAGNOSIS — J9601 Acute respiratory failure with hypoxia: Secondary | ICD-10-CM | POA: Diagnosis not present

## 2021-11-23 DIAGNOSIS — J189 Pneumonia, unspecified organism: Secondary | ICD-10-CM | POA: Diagnosis not present

## 2021-11-25 DIAGNOSIS — J328 Other chronic sinusitis: Secondary | ICD-10-CM | POA: Diagnosis not present

## 2021-11-25 DIAGNOSIS — J189 Pneumonia, unspecified organism: Secondary | ICD-10-CM | POA: Diagnosis not present

## 2021-11-25 DIAGNOSIS — Z9484 Stem cells transplant status: Secondary | ICD-10-CM | POA: Diagnosis not present

## 2021-11-25 DIAGNOSIS — B999 Unspecified infectious disease: Secondary | ICD-10-CM | POA: Diagnosis not present

## 2021-11-25 DIAGNOSIS — C8448 Peripheral T-cell lymphoma, not classified, lymph nodes of multiple sites: Secondary | ICD-10-CM | POA: Diagnosis not present

## 2021-11-25 DIAGNOSIS — D801 Nonfamilial hypogammaglobulinemia: Secondary | ICD-10-CM | POA: Diagnosis not present

## 2021-12-02 ENCOUNTER — Telehealth: Payer: Self-pay | Admitting: Primary Care

## 2021-12-02 NOTE — Telephone Encounter (Signed)
Called patient and let him know that the ONO was ordered on 11/14/21 and [employer] will be in touch with him in regards to their scheduling. I advised him that we have no control over their scheduling. Nothing further needed

## 2021-12-05 NOTE — Progress Notes (Signed)
Cardiology Office Note:    Date:  12/06/2021   ID:  Joseph Powell, DOB 1956-02-28, MRN [id number]  PCP:  Joseph Congress, NP   [place] Providers Cardiologist:  Joseph Klein, MD     Referring MD: Joseph Congress, NP    CC: Here for yearly follow up  History of Present Illness:    Joseph Powell is a 66 y.o. male with a hx of the following:   GERD T2DM Thrombocytopenia HLD Hypokalemia Asthma OSA Hx of PNA Hx of stage 4 high grade peripheral T cell lymphoma s/p BMT in March 2019 and R-CHOEP IgG deficiency Bell's palsy On chronic oxygen therapy, 3L on nasal cannula   A Myoview was obtained on July 29, 2016 showed LVEF of 64%, small defect of moderate severity present in the basal inferoseptal location consistent with attenuation artifact, overall this was a low risk study. He was referred to cardio oncology with Dr. Benjamine Mola for evaluation for small pericardial effusion. Serial echocardiograms did show abnormal GLS, which was normalized on the last echo in 2020.  In October 2020 him and his wife both contracted and tested positive for COVID-19.  He reported he only had mild course with loss of taste and mild congestion.  A repeat echocardiogram was performed on November 29, 2019 showed an EF of 50 to 55%, mild LVH, grade 1 diastolic dysfunction, no regional wall motion abnormality.  Coreg was stopped.  He was last seen in the office by Almyra Deforest, PA-C on July 20, 2020 for pre-op evaluation for left knee arthroplasty.  He denied any chest pain, shortness of breath, lower extremity edema, orthopnea or PND.  After stopping carvedilol, he developed a persistent headache and opted to restart carvedilol at a minimal dosage.  He was doing well from a cardiac perspective and was told to follow-up with cardiology in 1 year.   Today he presents for 1 year follow-up.  Says he is doing well from a cardiac perspective.  He is improving from having recent pneumonia, and  will be following up with pulmonology soon.  Found out he has a IgG deficiency which explains why he has had issues with upper respiratory tract infections, including recent pneumonia.  He is off oxygen and only states minimal shortness of breath but this is improving.  Denies chest pain, palpitations, syncope, presyncope.  Does admit to orthostatic dizziness with getting up too quickly but says this is related to getting over pneumonia.  Denies any swelling, weight changes, bleeding, or claudication.  Diet is going well and he tries to stay active with walking.  Works as a Corporate treasurer.  Blood pressures are stable at home and wants to know if he should be continuing on amlodipine.  Tolerating medications okay.  Denies any other questions or concerns.  Past Medical History:  Diagnosis Date   Allergic rhinitis    Asthma    slight   Bell's palsy    GERD (gastroesophageal reflux disease)    Gout    Hypercholesteremia    Melanoma (HCC)    Osteoarthritis    Sleep apnea    Resolving   Thrombocytopenia ([place])    Tubular adenoma     Past Surgical History:  Procedure Laterality Date   IR FLUORO GUIDE PORT INSERTION RIGHT  02/17/2017   IR REMOVAL TUN ACCESS W/ PORT W/O FL MOD SED  01/05/2018   IR US GUIDE VASC ACCESS RIGHT  02/17/2017   KNEE SURGERY  1993   MELANOMA EXCISION  NASAL RECONSTRUCTION     VASECTOMY  1999    Current Medications: Current Meds  Medication Sig   amLODipine (NORVASC) 5 MG tablet Take 1 tablet (5 mg total) by mouth daily. (Patient taking differently: Take 5 mg by mouth daily. Taking 1/2 tablet daily)   azithromycin (ZITHROMAX) 250 MG tablet    benzonatate (TESSALON) 200 MG capsule Take 1 capsule (200 mg total) by mouth 3 (three) times daily as needed for cough.   CALCIUM PO Take by mouth.   chlorpheniramine (CHLOR-TRIMETON) 4 MG tablet Take 4 mg by mouth 2 (two) times daily as needed for allergies.   Cyanocobalamin (VITAMIN B-12 PO) Take by mouth.   Vitamin D,  Ergocalciferol, (DRISDOL) 1.25 MG (50000 UNIT) CAPS capsule Take 1 capsule by mouth once a week   [DISCONTINUED] carvedilol (COREG) 3.125 MG tablet Take 1 tablet (3.125 mg total) by mouth 2 (two) times daily with a meal. PATIENT MUST SCHEDULE ANNUAL APPOINTMENT FOR FUTURE REFILLS. FIRST ATTEMPT     Allergies:   Patient has no known allergies.   Social History   Socioeconomic History   Marital status: Married    Spouse name: Not on file   Number of children: Not on file   Years of education: Not on file   Highest education level: Not on file  Occupational History   Occupation: Risk analyst  Tobacco Use   Smoking status: Never    Passive exposure: Past   Smokeless tobacco: Never  Vaping Use   Vaping Use: Never used  Substance and Sexual Activity   Alcohol use: Yes    Alcohol/week: 5.0 standard drinks of alcohol    Types: 5 Glasses of wine per week   Drug use: No   Sexual activity: Yes    Partners: Female  Other Topics Concern   Not on file  Social History Narrative   Not on file   Social Determinants of Health   Financial Resource Strain: Not on file  Food Insecurity: Not on file  Transportation Needs: Not on file  Physical Activity: Not on file  Stress: Not on file  Social Connections: Not on file     Family History: The patient's family history includes Asthma in his mother; Colon cancer in his paternal uncle; Diabetes in his mother; Gout in his brother. There is no history of Esophageal cancer or Stomach cancer.  ROS:   Review of Systems  Constitutional:  Negative for chills, diaphoresis, fever, malaise/fatigue and weight loss.  HENT: Negative.    Eyes: Negative.   Respiratory:  Positive for cough and shortness of breath. Negative for hemoptysis, sputum production and wheezing.        Improving d/t getting over recent PNA.   Cardiovascular: Negative.   Gastrointestinal: Negative.   Genitourinary: Negative.   Musculoskeletal: Negative.   Skin:  Negative for  itching and rash.  Neurological:  Positive for dizziness. Negative for tingling, tremors, sensory change, speech change, focal weakness, seizures, loss of consciousness, weakness and headaches.       See HPI - From getting up too quickly and from recent PNA.  Endo/Heme/Allergies: Negative.   Psychiatric/Behavioral: Negative.     Please see the history of present illness.    All other systems reviewed and are negative.  EKGs/Labs/Other Studies Reviewed:    The following studies were reviewed today:   EKG:  EKG is not ordered today.   Vascular ultrasound Dopplers (DVT) on October 18, 2021: Right: No evidence of DVT in lower extremity.  No cystic structure found in the popliteal fossa. Left: There is no evidence of DVT in lower extremity.  No cystic structure found in the popliteal fossa.   2D echo complete on October 17, 2021: 1.  LVEF is 60 to 65%.  Normal left ventricular function.  No regional wall motion abnormalities.  There is mild concentric left ventricular hypertrophy.  Left ventricular systolic parameters are normal. 2. Right ventricular systolic function is low normal.  The right ventricular size mildly enlarged. 3.  The mitral valve is normal in structure.  No mitral valve regurgitation or mitral valve stenosis. 4.  The aortic valve is normal in structure.  Aortic valve regurgitation is not visualized.  No aortic stenosis. 5.  IVC is normal in size with greater than 50% respiratory variability, suggesting right atrial pressure of 3 mmHg.   Nuclear medicine stress test 2018: Nuclear nuclear stress EF: 64%.  Blood pressure demonstrated normal response to exercise.  There was no ST segment deviation noted during stress.  Defect 1: There is a small defect of moderate severity present in the basal inferoseptal and basal inferolateral location.  This is a low risk study.  Left ventricular ejection fraction is normal (55 to 65%).     Recent Labs: 03/05/2021: TSH 5.07 10/17/2021: B  Natriuretic Peptide 372.0 10/19/2021: Magnesium 2.2 10/31/2021: Pro B Natriuretic peptide (BNP) 120.0 11/01/2021: ALT 51; BUN 17; Creatinine, Ser 0.80; Potassium 4.5; Sodium 133 11/14/2021: Hemoglobin 12.4; Platelets 127.0  Recent Lipid Panel    Component Value Date/Time   CHOL 221 (H) 03/05/2021 0835   TRIG 149.0 03/05/2021 0835   HDL 31.10 (L) 03/05/2021 0835   CHOLHDL 7 03/05/2021 0835   VLDL 29.8 03/05/2021 0835   LDLCALC 160 (H) 03/05/2021 0835     Risk Assessment/Calculations:          The 10-year ASCVD risk score (Arnett DK, et al., 2019) is: 37.5%   Values used to calculate the score:     Age: 43 years     Sex: Male     Is Non-Hispanic African American: No     Diabetic: Yes     Tobacco smoker: No     Systolic Blood Pressure: 426 mmHg     Is BP treated: Yes     HDL Cholesterol: 31.1 mg/dL     Total Cholesterol: 221 mg/dL   Physical Exam:    VS:  BP 128/82   Pulse 92   Ht '5\' 10"'$  (1.778 m)   Wt 181 lb 9.6 oz (82.4 kg)   SpO2 93%   BMI 26.06 kg/m     Wt Readings from Last 3 Encounters:  12/06/21 181 lb 9.6 oz (82.4 kg)  11/14/21 178 lb 6.4 oz (80.9 kg)  11/01/21 179 lb (81.2 kg)     GEN: Well nourished, well developed in no acute distress HEENT: Normal NECK: No JVD; No carotid bruits CARDIAC: RRR, Grade 2/6 systolic murmur noted; no rubs, gallops noted. 2+ pulses throughout, strong and equal bilaterally RESPIRATORY:  Diminished, fine crackles in bilateral posterior bases noted during auscultation without rales, wheezing or rhonchi  ABDOMEN: Soft, non-tender, non-distended MUSCULOSKELETAL:  No edema; No deformity  SKIN: Warm and dry NEUROLOGIC:  Alert and oriented x 3 PSYCHIATRIC:  Normal affect   ASSESSMENT:    1. Murmur   2. Hypertension, unspecified type   3. Hyperlipidemia, unspecified hyperlipidemia type   4. Pneumonia due to Haemophilus influenzae, unspecified laterality, unspecified part of lung ([place])    PLAN:  In order of problems listed  above:  Murmur - acute, stable States he has been told he has had a murmur before, however I do not see this documented from the last cardiology notes.  2D complete echo completed in June 2023 was unremarkable.  Will obtain 2D limited echo.   2.  Hypertension - acute, improved BP stable on exam.  Said he had high blood pressure when he had pneumonia and was put on amlodipine 5 mg daily.  Blood pressures stable at home with average readings in the 115s to the 120s.  He would like to know if he can come off amlodipine. We will discontinue amlodipine and he said he will monitor his blood pressure at home and if he starts to get elevated readings above 130 he will let us know and we can restart amlodipine.  Continue Coreg 3.125 mg twice daily. Discussed to monitor BP at home at least 2 hours after medications and sitting for 5-10 minutes.  3.  Hyperlipidemia-chronic, stable History of statin intolerance.  States does not want to be on cholesterol medication.  Last LDL in November 2022 was 160.  We will obtain fasting lipid panel and liver enzymes and consider referral to lipid clinic if LDL continues to be elevated.  4. PNA - acute, improving Says his symptoms are improving, he is off oxygen, and shortness of breath is improving as well.  Continue to follow-up with pulmonology and they will repeat chest x-ray to reevaluate his pneumonia.  Continue to follow-up with heme-onc due to history of IgG deficiency and hx of cancer.  5.  Disposition: Follow-up in 6 months or sooner if anything changes.    Medication Adjustments/Labs and Tests Ordered: Current medicines are reviewed at length with the patient today.  Concerns regarding medicines are outlined above.  Orders Placed This Encounter  Procedures   Lipid panel   Hepatic function panel   ECHOCARDIOGRAM LIMITED   Meds ordered this encounter  Medications   carvedilol (COREG) 3.125 MG tablet    Sig: Take 1 tablet (3.125 mg total) by mouth 2  (two) times daily with a meal.    Dispense:  30 tablet    Refill:  9    PATIENT MUST SCHEDULE ANNUAL APPOINTMENT FOR FUTURE REFILLS. FIRST ATTEMPT    Patient Instructions  Medication Instructions:  STOP AMLODIPINE  *If you need a refill on your cardiac medications before your next appointment, please call your pharmacy*  Lab Work:    FASTING LIPID AND LFT WITHIN 2 WEEKS      If you have labs (blood work) drawn today and your tests are completely normal, you will receive your results only by: [place] (if you have MyChart) OR  A paper copy in the mail If you have any lab test that is abnormal or we need to change your treatment, we will call you to review the results.  You may also go to any of these [facility] locations:  [place] [address],  [place] [address] ([facility])  [address]  [place] [address] ([facility])  Testing/Procedures:  Echocardiogram - Your physician has requested that you have an echocardiogram. Echocardiography  is a painless test that uses sound waves to create images of your heart. It provides your doctor with information about the size and shape of your heart and how well your heart's chambers and valves are working. This procedure takes approximately one hour. There are no restrictions for this procedure.   CHANGE POSITIONS SLOWLY-SHOULD YOU FEEL WORSE PLEASE CONTACT us TO LET us KNOW SO WE CAN HELP YOU.  Follow-Up: Your next appointment:  6 month(s) In Person with Joseph Klein, MD    Please call our office 2 months in advance to schedule this appointment   At [facility], you and your health needs are our priority.  As part of our continuing mission to provide you with  exceptional heart care, we have created designated Provider Care Teams.  These Care Teams include your primary Cardiologist (physician) and Advanced Practice Providers (APPs -  Physician Assistants and Nurse Practitioners) who all work together to provide you with the care you need, when you need it.  Important Information About Sugar           Signed, Finis Bud, NP  12/06/2021 12:06 PM    [facility]

## 2021-12-06 ENCOUNTER — Ambulatory Visit (INDEPENDENT_AMBULATORY_CARE_PROVIDER_SITE_OTHER): Payer: BC Managed Care – PPO | Admitting: Nurse Practitioner

## 2021-12-06 ENCOUNTER — Encounter: Payer: Self-pay | Admitting: General Practice

## 2021-12-06 VITALS — BP 128/82 | HR 92 | Ht 70.0 in | Wt 181.6 lb

## 2021-12-06 DIAGNOSIS — E785 Hyperlipidemia, unspecified: Secondary | ICD-10-CM | POA: Diagnosis not present

## 2021-12-06 DIAGNOSIS — J14 Pneumonia due to Hemophilus influenzae: Secondary | ICD-10-CM | POA: Diagnosis not present

## 2021-12-06 DIAGNOSIS — I10 Essential (primary) hypertension: Secondary | ICD-10-CM

## 2021-12-06 DIAGNOSIS — R011 Cardiac murmur, unspecified: Secondary | ICD-10-CM | POA: Diagnosis not present

## 2021-12-06 MED ORDER — CARVEDILOL 3.125 MG PO TABS: 3.1250 mg | ORAL_TABLET | Freq: Two times a day (BID) | ORAL | 9 refills | Status: DC

## 2021-12-06 NOTE — Patient Instructions (Addendum)
Medication Instructions:  STOP AMLODIPINE  *If you need a refill on your cardiac medications before your next appointment, please call your pharmacy*  Lab Work:    FASTING LIPID AND LFT WITHIN 2 WEEKS      If you have labs (blood work) drawn today and your tests are completely normal, you will receive your results only by: [place] (if you have MyChart) OR  A paper copy in the mail If you have any lab test that is abnormal or we need to change your treatment, we will call you to review the results.  You may also go to any of these [facility] locations:  [place] [address],  [place] [address] ([facility])  [address]  [place] [address] ([facility])  Testing/Procedures:  Echocardiogram - Your physician has requested that you have an echocardiogram. Echocardiography is a painless test that uses sound waves to create images of your heart. It provides your doctor with information about the size and shape of your heart and how well your heart's chambers and valves are working. This procedure takes approximately one hour. There are no restrictions for this procedure.   CHANGE POSITIONS SLOWLY-SHOULD YOU FEEL WORSE PLEASE CONTACT us TO LET us KNOW SO WE CAN HELP YOU.  Follow-Up: Your next appointment:  6 month(s) In Person with Sanda Klein, MD    Please call our office 2 months in advance to schedule this appointment   At [facility], you and your health needs are our priority.  As part of our continuing mission to provide you with exceptional heart care, we have created designated Provider Care Teams.  These Care Teams include your primary Cardiologist (physician) and Advanced Practice Providers (APPs -   Physician Assistants and Nurse Practitioners) who all work together to provide you with the care you need, when you need it.  Important Information About Sugar

## 2021-12-07 DIAGNOSIS — J9601 Acute respiratory failure with hypoxia: Secondary | ICD-10-CM

## 2021-12-09 NOTE — Telephone Encounter (Signed)
Called [facility] and spoke with Denay. She states the order was never pulled from Epic even though there was confirmation they received the order from out PCC[facility]. Early Chars states someone will reach out to him today.

## 2021-12-10 ENCOUNTER — Other Ambulatory Visit: Payer: Self-pay | Admitting: Hematology

## 2021-12-10 DIAGNOSIS — C8442 Peripheral T-cell lymphoma, not classified, intrathoracic lymph nodes: Secondary | ICD-10-CM

## 2021-12-11 DIAGNOSIS — Z9484 Stem cells transplant status: Secondary | ICD-10-CM | POA: Diagnosis not present

## 2021-12-11 DIAGNOSIS — C8448 Peripheral T-cell lymphoma, not classified, lymph nodes of multiple sites: Secondary | ICD-10-CM | POA: Diagnosis not present

## 2021-12-11 DIAGNOSIS — D801 Nonfamilial hypogammaglobulinemia: Secondary | ICD-10-CM | POA: Diagnosis not present

## 2021-12-12 DIAGNOSIS — D801 Nonfamilial hypogammaglobulinemia: Secondary | ICD-10-CM | POA: Diagnosis not present

## 2021-12-12 DIAGNOSIS — C8448 Peripheral T-cell lymphoma, not classified, lymph nodes of multiple sites: Secondary | ICD-10-CM | POA: Diagnosis not present

## 2021-12-12 DIAGNOSIS — Z9484 Stem cells transplant status: Secondary | ICD-10-CM | POA: Diagnosis not present

## 2021-12-13 DIAGNOSIS — R0683 Snoring: Secondary | ICD-10-CM | POA: Diagnosis not present

## 2021-12-13 DIAGNOSIS — G4730 Sleep apnea, unspecified: Secondary | ICD-10-CM | POA: Diagnosis not present

## 2021-12-18 ENCOUNTER — Ambulatory Visit (HOSPITAL_COMMUNITY): Payer: BC Managed Care – PPO | Attending: Cardiology

## 2021-12-18 DIAGNOSIS — R011 Cardiac murmur, unspecified: Secondary | ICD-10-CM | POA: Diagnosis not present

## 2021-12-18 LAB — ECHOCARDIOGRAM LIMITED
Area-P 1/2: 3.05 cm2
S' Lateral: 2.5 cm

## 2021-12-18 NOTE — Telephone Encounter (Signed)
Beth, please advise if you have these results (ONO). Thanks.

## 2021-12-19 NOTE — Telephone Encounter (Signed)
Received ONO results for Mr. Joseph Powell that have the incorrect date on them/ Nov 2006. Can we please call [employer] and get this straightened out asap. I will be out this afternoon but can review them first thing in the morning if left on my desk

## 2021-12-19 NOTE — Telephone Encounter (Signed)
Called [facility] and spoke with [facility] and was advised they will refax the most recent results, from 8/7.   Will forward back to triage to look for results as pt has been waiting on the test and results. Once received, place ONO results on Beth's desk for her to review.   Will forward to [facility] to advise on results.

## 2021-12-23 ENCOUNTER — Other Ambulatory Visit: Payer: Self-pay | Admitting: Primary Care

## 2021-12-23 NOTE — Telephone Encounter (Signed)
ONO on room air on 12/13/21>> SpO2 low 89%, baseline 94%. We can discontinue nocturnal oxygen.

## 2021-12-23 NOTE — Telephone Encounter (Signed)
It is not in my folder. I was in [place] on Friday. I NEED this today- if someone can get it and bring it to me in person.

## 2021-12-23 NOTE — Telephone Encounter (Signed)
Will check on ONO in Beths basket. If no results will call [employer] again for them to refax results for Beth to look at.

## 2021-12-26 ENCOUNTER — Other Ambulatory Visit: Payer: Self-pay

## 2021-12-26 DIAGNOSIS — C8442 Peripheral T-cell lymphoma, not classified, intrathoracic lymph nodes: Secondary | ICD-10-CM

## 2021-12-27 ENCOUNTER — Inpatient Hospital Stay: Payer: BC Managed Care – PPO | Attending: Hematology

## 2021-12-27 ENCOUNTER — Other Ambulatory Visit: Payer: Self-pay

## 2021-12-27 ENCOUNTER — Inpatient Hospital Stay (HOSPITAL_BASED_OUTPATIENT_CLINIC_OR_DEPARTMENT_OTHER): Payer: BC Managed Care – PPO | Admitting: Hematology

## 2021-12-27 VITALS — BP 114/83 | HR 76 | Temp 97.0°F | Resp 16 | Wt 184.7 lb

## 2021-12-27 DIAGNOSIS — C8442 Peripheral T-cell lymphoma, not classified, intrathoracic lymph nodes: Secondary | ICD-10-CM

## 2021-12-27 DIAGNOSIS — Z8572 Personal history of non-Hodgkin lymphomas: Secondary | ICD-10-CM | POA: Insufficient documentation

## 2021-12-27 DIAGNOSIS — Z85820 Personal history of malignant melanoma of skin: Secondary | ICD-10-CM | POA: Diagnosis not present

## 2021-12-27 LAB — CMP (CANCER CENTER ONLY)
ALT: 14 U/L (ref 0–44)
AST: 28 U/L (ref 15–41)
Albumin: 4.3 g/dL (ref 3.5–5.0)
Alkaline Phosphatase: 89 U/L (ref 38–126)
Anion gap: 6 (ref 5–15)
BUN: 12 mg/dL (ref 8–23)
CO2: 27 mmol/L (ref 22–32)
Calcium: 9.4 mg/dL (ref 8.9–10.3)
Chloride: 108 mmol/L (ref 98–111)
Creatinine: 0.68 mg/dL (ref 0.61–1.24)
GFR, Estimated: >60 mL/min (ref >60)
Glucose, Bld: 137 mg/dL — ABNORMAL HIGH (ref 70–99)
Potassium: 4.4 mmol/L (ref 3.5–5.1)
Sodium: 141 mmol/L (ref 135–145)
Total Bilirubin: 0.9 mg/dL (ref 0.3–1.2)
Total Protein: 6.5 g/dL (ref 6.5–8.1)

## 2021-12-27 LAB — CBC WITH DIFFERENTIAL (CANCER CENTER ONLY)
Abs Immature Granulocytes: 0.1 10*3/uL — ABNORMAL HIGH (ref 0.00–0.07)
Basophils Absolute: 0 10*3/uL (ref 0.0–0.1)
Basophils Relative: 0 %
Eosinophils Absolute: 0 10*3/uL (ref 0.0–0.5)
Eosinophils Relative: 0 %
HCT: 40.6 % (ref 39.0–52.0)
Hemoglobin: 14.3 g/dL (ref 13.0–17.0)
Immature Granulocytes: 2 %
Lymphocytes Relative: 15 %
Lymphs Abs: 1 10*3/uL (ref 0.7–4.0)
MCH: 32.4 pg (ref 26.0–34.0)
MCHC: 35.2 g/dL (ref 30.0–36.0)
MCV: 91.9 fL (ref 80.0–100.0)
Monocytes Absolute: 1.7 10*3/uL — ABNORMAL HIGH (ref 0.1–1.0)
Monocytes Relative: 25 %
Neutro Abs: 3.8 10*3/uL (ref 1.7–7.7)
Neutrophils Relative %: 58 %
Platelet Count: 129 10*3/uL — ABNORMAL LOW (ref 150–400)
RBC: 4.42 MIL/uL (ref 4.22–5.81)
RDW: 13.5 % (ref 11.5–15.5)
WBC Count: 6.7 10*3/uL (ref 4.0–10.5)
nRBC: 0 % (ref 0.0–0.2)

## 2021-12-27 LAB — LACTATE DEHYDROGENASE: LDH: 144 U/L (ref 98–192)

## 2021-12-29 NOTE — Progress Notes (Signed)
HEMATOLOGY/ONCOLOGY CLINIC NOTE  Date of Service: 12/27/2021  Patient Care Team: Faustino Congress, NP as PCP - General (Family Medicine) Croitoru, Dani Gobble, MD as PCP - Cardiology (Cardiology)  CHIEF COMPLAINTS/PURPOSE OF CONSULTATION:   Follow-up for active surveillance of high-grade T-cell non-Hodgkin's lymphoma  CURRENT THERAPY: post BMT active surveillance  Previous Treatment; R-CHOEP x 6 cycles BEAM -Auto HSCT  HISTORY OF PRESENTING ILLNESS:  See previous note for details  INTERVAL HISTORY: Joseph Powell is a 66 y.o. male here for follow-up of his T-cell non-Hodgkin's lymphoma. He reports He is doing well with no new symptoms or concerns.  ***  No fevers, chills, or night sweats No new or unexpected weight loss. No new lumps, bumps, or lesions/rashes. No other acute new focal symptoms.  Labs done today were reviewed in detail.  MEDICAL HISTORY:  Past Medical History:  Diagnosis Date   Allergic rhinitis    Asthma    slight   Bell's palsy    GERD (gastroesophageal reflux disease)    Gout    Hypercholesteremia    Melanoma (HCC)    Osteoarthritis    Sleep apnea    Resolving   Thrombocytopenia (HCC)    Tubular adenoma     SURGICAL HISTORY: Past Surgical History:  Procedure Laterality Date   IR FLUORO GUIDE PORT INSERTION RIGHT  02/17/2017   IR REMOVAL TUN ACCESS W/ PORT W/O FL MOD SED  01/05/2018   IR US GUIDE VASC ACCESS RIGHT  02/17/2017   KNEE SURGERY  1993   MELANOMA EXCISION     NASAL RECONSTRUCTION     VASECTOMY  1999    SOCIAL HISTORY: Social History   Socioeconomic History   Marital status: Married    Spouse name: Not on file   Number of children: Not on file   Years of education: Not on file   Highest education level: Not on file  Occupational History   Occupation: Risk analyst  Tobacco Use   Smoking status: Never    Passive exposure: Past   Smokeless tobacco: Never  Vaping Use   Vaping Use: Never used  Substance and  Sexual Activity   Alcohol use: Yes    Alcohol/week: 5.0 standard drinks of alcohol    Types: 5 Glasses of wine per week   Drug use: No   Sexual activity: Yes    Partners: Female  Other Topics Concern   Not on file  Social History Narrative   Not on file   Social Determinants of Health   Financial Resource Strain: Not on file  Food Insecurity: Not on file  Transportation Needs: Not on file  Physical Activity: Not on file  Stress: Not on file  Social Connections: Not on file  Intimate Partner Violence: Not on file    FAMILY HISTORY: Family History  Problem Relation Age of Onset   Diabetes Mother    Asthma Mother    Gout Brother    Colon cancer Paternal Uncle    Esophageal cancer Neg Hx    Stomach cancer Neg Hx     ALLERGIES:  has No Known Allergies.  MEDICATIONS:  Current Outpatient Medications  Medication Sig Dispense Refill   benzonatate (TESSALON) 200 MG capsule Take 1 capsule (200 mg total) by mouth 3 (three) times daily as needed for cough. 60 capsule 3   CALCIUM PO Take by mouth.     carvedilol (COREG) 3.125 MG tablet Take 1 tablet (3.125 mg total) by mouth 2 (two) times daily with  a meal. 30 tablet 9   chlorpheniramine (CHLOR-TRIMETON) 4 MG tablet Take 4 mg by mouth 2 (two) times daily as needed for allergies.     Cyanocobalamin (VITAMIN B-12 PO) Take by mouth.     Vitamin D, Ergocalciferol, (DRISDOL) 1.25 MG (50000 UNIT) CAPS capsule Take 1 capsule by mouth once a week 12 capsule 0   amLODipine (NORVASC) 5 MG tablet Take 1 tablet (5 mg total) by mouth daily. (Patient taking differently: Take 5 mg by mouth daily. Taking 1/2 tablet daily) 30 tablet 0   azithromycin (ZITHROMAX) 250 MG tablet      No current facility-administered medications for this visit.    REVIEW OF SYSTEMS:   10 Point review of Systems was done is negative except as noted above.   PHYSICAL EXAMINATION: ECOG PERFORMANCE STATUS: 1 - Symptomatic but completely ambulatory  Vitals:    12/27/21 1237  BP: 114/83  Pulse: 76  Resp: 16  Temp: (!) 97 F (36.1 C)  SpO2: 99%   Filed Weights   12/27/21 1237  Weight: 184 lb 11.2 oz (83.8 kg)   .Body mass index is 26.5 kg/m.  NAD*** GENERAL:alert, in no acute distress and comfortable SKIN: no acute rashes, no significant lesions EYES: conjunctiva are pink and non-injected, sclera anicteric NECK: supple, no JVD LYMPH:  no palpable lymphadenopathy in the cervical, axillary or inguinal regions LUNGS: clear to auscultation b/l with normal respiratory effort HEART: regular rate & rhythm ABDOMEN:  normoactive bowel sounds , non tender, not distended. Extremity: no pedal edema PSYCH: alert & oriented x 3 with fluent speech NEURO: no focal motor/sensory deficits  LABORATORY DATA:  I have reviewed the data as listed  .    Latest Ref Rng & Units 12/27/2021   12:07 PM 11/14/2021   12:40 PM 11/01/2021    7:46 PM  CBC  WBC 4.0 - 10.5 K/uL 6.7  11.5  16.6   Hemoglobin 13.0 - 17.0 g/dL 14.3  12.4  12.5   Hematocrit 39.0 - 52.0 % 40.6  37.5  37.1   Platelets 150 - 400 K/uL 129  127.0  87   .CBC    Component Value Date/Time   WBC 6.7 12/27/2021 1207   WBC 11.5 (H) 11/14/2021 1240   RBC 4.42 12/27/2021 1207   HGB 14.3 12/27/2021 1207   HGB 8.6 (L) 05/01/2017 0815   HCT 40.6 12/27/2021 1207   HCT 27.6 (L) 05/01/2017 0815   PLT 129 (L) 12/27/2021 1207   PLT 30 (L) 05/01/2017 0815   MCV 91.9 12/27/2021 1207   MCV 87.3 05/01/2017 0815   MCH 32.4 12/27/2021 1207   MCHC 35.2 12/27/2021 1207   RDW 13.5 12/27/2021 1207   RDW 18.4 (H) 05/01/2017 0815   LYMPHSABS 1.0 12/27/2021 1207   LYMPHSABS 1.0 05/01/2017 0815   MONOABS 1.7 (H) 12/27/2021 1207   MONOABS 1.8 (H) 05/01/2017 0815   EOSABS 0.0 12/27/2021 1207   EOSABS 0.0 05/01/2017 0815   BASOSABS 0.0 12/27/2021 1207   BASOSABS 0.3 (H) 05/01/2017 0815       Latest Ref Rng & Units 12/27/2021   12:07 PM 11/01/2021    7:46 PM 10/31/2021    1:36 PM  CMP  Glucose 70 - 99  mg/dL 137  335  310   BUN 8 - 23 mg/dL '12  17  16   '$ Creatinine 0.61 - 1.24 mg/dL 0.68  0.80  1.15   Sodium 135 - 145 mmol/L 141  133  131  Potassium 3.5 - 5.1 mmol/L 4.4  4.5  3.4   Chloride 98 - 111 mmol/L 108  95  92   CO2 22 - 32 mmol/L '27  25  24   '$ Calcium 8.9 - 10.3 mg/dL 9.4  10.0  9.0   Total Protein 6.5 - 8.1 g/dL 6.5  6.5    Total Bilirubin 0.3 - 1.2 mg/dL 0.9  1.1    Alkaline Phos 38 - 126 U/L 89  75    AST 15 - 41 U/L 28  51    ALT 0 - 44 U/L 14  51     . Lab Results  Component Value Date   LDH 144 12/27/2021       06/22/17 Cytogenetics Report:   06/22/17 Flow Cytometry:    06/22/17 BM Bx:    RADIOGRAPHIC STUDIES:  I have personally reviewed the radiological images as listed and agreed with the findings in the report. ECHOCARDIOGRAM LIMITED  Result Date: 12/18/2021    ECHOCARDIOGRAM LIMITED REPORT   Patient Name:   EWIN REHBERG Date of Exam: 12/18/2021 Medical Rec #:  [id number]         Height:       70.0 in Accession #:    [id number]        Weight:       181.6 lb Date of Birth:  06-20-1955         BSA:          2.003 m Patient Age:    25 years          BP:           128/82 mmHg Patient Gender: M                 HR:           81 bpm. Exam Location:  [address] Procedure: Limited Echo, Cardiac Doppler and Limited Color Doppler Indications:    R01.1 Murmur  History:        Patient has prior history of Echocardiogram examinations, most                 recent 10/17/2021. Signs/Symptoms:Murmur; Risk                 Factors:Hypertension and Dyslipidemia.  Sonographer:    Coralyn Helling RDCS Referring Phys: [id number] [place]  1. Left ventricular ejection fraction, by estimation, is 55 to 60%. The left ventricle has normal function. The left ventricle has no regional wall motion abnormalities. Left ventricular diastolic parameters are consistent with Grade I diastolic dysfunction (impaired relaxation).  2. Right ventricular systolic function is normal.  The right ventricular size is normal. Tricuspid regurgitation signal is inadequate for assessing PA pressure.  3. The mitral valve is normal in structure. Trivial mitral valve regurgitation. No evidence of mitral stenosis.  4. The aortic valve is tricuspid. Aortic valve regurgitation is not visualized. No aortic stenosis is present.  5. IVC not visualized FINDINGS  Left Ventricle: Left ventricular ejection fraction, by estimation, is 55 to 60%. The left ventricle has normal function. The left ventricle has no regional wall motion abnormalities. The left ventricular internal cavity size was normal in size. There is  no left ventricular hypertrophy. Left ventricular diastolic parameters are consistent with Grade I diastolic dysfunction (impaired relaxation). Right Ventricle: The right ventricular size is normal. No increase in right ventricular wall thickness. Right ventricular systolic function is normal. Tricuspid regurgitation signal is inadequate for assessing PA  pressure. Left Atrium: Left atrial size was normal in size. Right Atrium: Right atrial size was normal in size. Pericardium: There is no evidence of pericardial effusion. Mitral Valve: The mitral valve is normal in structure. Trivial mitral valve regurgitation. No evidence of mitral valve stenosis. Tricuspid Valve: The tricuspid valve is normal in structure. Tricuspid valve regurgitation is not demonstrated. Aortic Valve: The aortic valve is tricuspid. Aortic valve regurgitation is not visualized. No aortic stenosis is present. Pulmonic Valve: The pulmonic valve was normal in structure. Pulmonic valve regurgitation is trivial. Venous: The inferior vena cava was not well visualized. IAS/Shunts: No atrial level shunt detected by color flow Doppler. LEFT VENTRICLE PLAX 2D LVIDd:         3.80 cm Diastology LVIDs:         2.50 cm LV e' medial:    8.05 cm/s LV PW:         1.00 cm LV E/e' medial:  9.2 LV IVS:        1.30 cm LV e' lateral:   10.10 cm/s                         LV E/e' lateral: 7.4  LEFT ATRIUM         Index LA diam:    3.80 cm 1.90 cm/m  AORTIC VALVE LVOT Vmax:   87.00 cm/s LVOT Vmean:  57.300 cm/s LVOT VTI:    0.175 m MITRAL VALVE MV Area (PHT): 3.05 cm    SHUNTS MV Decel Time: 249 msec    Systemic VTI: 0.18 m MV E velocity: 74.40 cm/s MV A velocity: 91.20 cm/s MV E/A ratio:  0.82 Dalton McleanMD Electronically signed by Franki Monte Signature Date/Time: 12/18/2021/2:23:48 PM    Final      11/13/17 PET/CT    ASSESSMENT & PLAN:    CAPRICE WASKO is a 66 y.o. 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 Patient has some chronic thrombocytopenia 85-125k for a few years. Platelets upon initial visit are down to 36k in the setting of newly diagnosed lymphoma and treatment with R-CHOEP and from hypersplenism but then improved. PLt counts had normalized after treatment of the lymphoma pre-transplant Has continued to have some low-grade thrombocytopenia posttransplant. PLAN: -Patient's labs from today were reviewed CBC shows normal hemoglobin of 14.3, normal WBC count of 6.7k with chronic thrombocytopenia with platelets of 129k -Mild neutropenia we will monitor.  Could be from his allopurinol and allergies. CMP unremarkable except a blood sugar of 137 LDH within normal limits at 144 No clinical symptoms suggestive of lymphoma recurrence at this time. No lab evidence of lymphoma recurrence at this time.  #3 elevated blood sugar levels/DM2 -Patient notes weight gain and has elevated blood sugar of 137. -Continue to follow-up for fasting glucose and hemoglobin A1c with primary care physician.  #4 History of Melanoma, 2009 -Follow-up with dermatology.   #8 Patient Active Problem List   Diagnosis Date Noted   Elevated blood pressure reading 10/31/2021    Multifocal pneumonia    Non-recurrent acute suppurative otitis media of both ears without spontaneous rupture of tympanic membranes    Haemophilus infection    Penicillin allergy    Malnutrition of moderate degree 10/18/2021   Pneumonia of both lungs due to infectious organism 10/17/2021  Hypokalemia 10/17/2021   Acute respiratory failure with hypoxia ([place]) 10/17/2021   Sepsis due to pneumonia ([place]) 10/16/2021   Abdominal pain 07/22/2021   Type 2 diabetes mellitus without complication, without long-term current use of insulin ([place]) 03/05/2021   Mixed hyperlipidemia 03/05/2021   Encounter for general adult medical examination with abnormal findings 03/05/2021   Non-seasonal allergic rhinitis due to pollen 03/05/2021   Cough variant asthma vs UACS 01/27/2017   Mild asthma 08/05/2016   DJD (degenerative joint disease) 08/05/2016   HLD (hyperlipidemia) 07/27/2016   GERD without esophagitis 07/27/2016   Gout 07/27/2016   OSA (obstructive sleep apnea) 09/19/2013   Thrombocytopenia ([place]) 04/14/2011   SCHATZKI'S RING 08/21/2009  -Continue follow-up with primary to manage other medical issues  FOLLOW UP: RTC with Dr Irene Limbo with labs in 12 months   The total time spent in the appointment was *** minutes*.  All of the patient's questions were answered with apparent satisfaction. The patient knows to call the clinic with any problems, questions or concerns.   Sullivan Lone MD MS AAHIVMS [facility] Hematology/Oncology Physician [facility]  .*Total Encounter Time as defined by the Centers for Medicare and Medicaid Services includes, in addition to the face-to-face time of a patient visit (documented in the note above) non-face-to-face time: obtaining and reviewing outside history, ordering and reviewing medications, tests or procedures, care coordination (communications with other health care professionals or caregivers) and documentation in the medical record.  I, Melene Muller,  am acting as scribe for Dr. Sullivan Lone, MD.

## 2021-12-30 ENCOUNTER — Ambulatory Visit (INDEPENDENT_AMBULATORY_CARE_PROVIDER_SITE_OTHER): Payer: BC Managed Care – PPO

## 2021-12-30 DIAGNOSIS — J189 Pneumonia, unspecified organism: Secondary | ICD-10-CM

## 2021-12-30 DIAGNOSIS — L570 Actinic keratosis: Secondary | ICD-10-CM | POA: Diagnosis not present

## 2021-12-30 DIAGNOSIS — Z08 Encounter for follow-up examination after completed treatment for malignant neoplasm: Secondary | ICD-10-CM | POA: Diagnosis not present

## 2021-12-30 DIAGNOSIS — Z85820 Personal history of malignant melanoma of skin: Secondary | ICD-10-CM | POA: Diagnosis not present

## 2021-12-30 DIAGNOSIS — L578 Other skin changes due to chronic exposure to nonionizing radiation: Secondary | ICD-10-CM | POA: Diagnosis not present

## 2021-12-31 NOTE — Progress Notes (Signed)
Please let patient know previous pneumonia has resolved. No acute process

## 2022-01-02 DIAGNOSIS — H906 Mixed conductive and sensorineural hearing loss, bilateral: Secondary | ICD-10-CM | POA: Diagnosis not present

## 2022-01-02 DIAGNOSIS — H6983 Other specified disorders of Eustachian tube, bilateral: Secondary | ICD-10-CM | POA: Diagnosis not present

## 2022-01-09 DIAGNOSIS — D801 Nonfamilial hypogammaglobulinemia: Secondary | ICD-10-CM | POA: Diagnosis not present

## 2022-01-09 DIAGNOSIS — Z9484 Stem cells transplant status: Secondary | ICD-10-CM | POA: Diagnosis not present

## 2022-01-09 DIAGNOSIS — Z79899 Other long term (current) drug therapy: Secondary | ICD-10-CM | POA: Diagnosis not present

## 2022-01-09 DIAGNOSIS — R059 Cough, unspecified: Secondary | ICD-10-CM | POA: Diagnosis not present

## 2022-01-09 DIAGNOSIS — B999 Unspecified infectious disease: Secondary | ICD-10-CM | POA: Diagnosis not present

## 2022-01-09 DIAGNOSIS — M549 Dorsalgia, unspecified: Secondary | ICD-10-CM | POA: Diagnosis not present

## 2022-01-09 DIAGNOSIS — C8448 Peripheral T-cell lymphoma, not classified, lymph nodes of multiple sites: Secondary | ICD-10-CM | POA: Diagnosis not present

## 2022-01-10 DIAGNOSIS — H6983 Other specified disorders of Eustachian tube, bilateral: Secondary | ICD-10-CM | POA: Diagnosis not present

## 2022-01-10 DIAGNOSIS — H906 Mixed conductive and sensorineural hearing loss, bilateral: Secondary | ICD-10-CM | POA: Diagnosis not present

## 2022-01-20 DIAGNOSIS — C8448 Peripheral T-cell lymphoma, not classified, lymph nodes of multiple sites: Secondary | ICD-10-CM | POA: Diagnosis not present

## 2022-01-20 DIAGNOSIS — D801 Nonfamilial hypogammaglobulinemia: Secondary | ICD-10-CM | POA: Diagnosis not present

## 2022-01-31 ENCOUNTER — Ambulatory Visit: Payer: BC Managed Care – PPO | Admitting: Internal Medicine

## 2022-02-06 DIAGNOSIS — B999 Unspecified infectious disease: Secondary | ICD-10-CM | POA: Diagnosis not present

## 2022-02-06 DIAGNOSIS — Z79899 Other long term (current) drug therapy: Secondary | ICD-10-CM | POA: Diagnosis not present

## 2022-02-06 DIAGNOSIS — R059 Cough, unspecified: Secondary | ICD-10-CM | POA: Diagnosis not present

## 2022-02-06 DIAGNOSIS — M549 Dorsalgia, unspecified: Secondary | ICD-10-CM | POA: Diagnosis not present

## 2022-02-06 DIAGNOSIS — C8448 Peripheral T-cell lymphoma, not classified, lymph nodes of multiple sites: Secondary | ICD-10-CM | POA: Diagnosis not present

## 2022-02-06 DIAGNOSIS — G2581 Restless legs syndrome: Secondary | ICD-10-CM | POA: Diagnosis not present

## 2022-02-06 DIAGNOSIS — Z85820 Personal history of malignant melanoma of skin: Secondary | ICD-10-CM | POA: Diagnosis not present

## 2022-02-06 DIAGNOSIS — D801 Nonfamilial hypogammaglobulinemia: Secondary | ICD-10-CM | POA: Diagnosis not present

## 2022-02-06 DIAGNOSIS — Z9484 Stem cells transplant status: Secondary | ICD-10-CM | POA: Diagnosis not present

## 2022-02-17 DIAGNOSIS — H906 Mixed conductive and sensorineural hearing loss, bilateral: Secondary | ICD-10-CM | POA: Diagnosis not present

## 2022-02-17 DIAGNOSIS — H6993 Unspecified Eustachian tube disorder, bilateral: Secondary | ICD-10-CM | POA: Diagnosis not present

## 2022-02-21 ENCOUNTER — Ambulatory Visit (INDEPENDENT_AMBULATORY_CARE_PROVIDER_SITE_OTHER): Payer: BC Managed Care – PPO | Admitting: Internal Medicine

## 2022-02-21 ENCOUNTER — Other Ambulatory Visit: Payer: BC Managed Care – PPO

## 2022-02-21 ENCOUNTER — Encounter: Payer: Self-pay | Admitting: Internal Medicine

## 2022-02-21 VITALS — BP 124/76 | HR 71 | Temp 97.8°F | Ht 70.0 in | Wt 197.0 lb

## 2022-02-21 DIAGNOSIS — J122 Parainfluenza virus pneumonia: Secondary | ICD-10-CM | POA: Diagnosis not present

## 2022-02-21 DIAGNOSIS — J9600 Acute respiratory failure, unspecified whether with hypoxia or hypercapnia: Secondary | ICD-10-CM | POA: Diagnosis not present

## 2022-02-21 DIAGNOSIS — Z23 Encounter for immunization: Secondary | ICD-10-CM

## 2022-02-21 DIAGNOSIS — R059 Cough, unspecified: Secondary | ICD-10-CM | POA: Diagnosis not present

## 2022-02-21 DIAGNOSIS — R0989 Other specified symptoms and signs involving the circulatory and respiratory systems: Secondary | ICD-10-CM

## 2022-02-21 DIAGNOSIS — K222 Esophageal obstruction: Secondary | ICD-10-CM

## 2022-02-21 DIAGNOSIS — R053 Chronic cough: Secondary | ICD-10-CM

## 2022-02-21 DIAGNOSIS — R1310 Dysphagia, unspecified: Secondary | ICD-10-CM

## 2022-02-21 DIAGNOSIS — Z8709 Personal history of other diseases of the respiratory system: Secondary | ICD-10-CM | POA: Diagnosis not present

## 2022-02-21 LAB — CBC WITH DIFFERENTIAL/PLATELET
Absolute Monocytes: 1705 cells/uL — ABNORMAL HIGH (ref 200–950)
Basophils Absolute: 12 cells/uL (ref 0–200)
Basophils Relative: 0.2 %
Eosinophils Absolute: 29 cells/uL (ref 15–500)
Eosinophils Relative: 0.5 %
HCT: 47.4 % (ref 38.5–50.0)
Hemoglobin: 15.7 g/dL (ref 13.2–17.1)
Lymphs Abs: 1114 cells/uL (ref 850–3900)
MCH: 31.2 pg (ref 27.0–33.0)
MCHC: 33.1 g/dL (ref 32.0–36.0)
MCV: 94.2 fL (ref 80.0–100.0)
MPV: 12.9 fL — ABNORMAL HIGH (ref 7.5–12.5)
Monocytes Relative: 29.4 %
Neutro Abs: 2941 cells/uL (ref 1500–7800)
Neutrophils Relative %: 50.7 %
Platelets: 98 10*3/uL — ABNORMAL LOW (ref 140–400)
RBC: 5.03 10*6/uL (ref 4.20–5.80)
RDW: 12.2 % (ref 11.0–15.0)
Total Lymphocyte: 19.2 %
WBC: 5.8 10*3/uL (ref 3.8–10.8)

## 2022-02-21 NOTE — Progress Notes (Signed)
10/31/2021 66-year-old male, never smoked.  Past medical history significant for OSA, multifocal pneumonia, cough variant asthma, acute respiratory failure with hypoxia, type 2 diabetes.  Patient of Dr. Melvyn Novas, last seen by pulmonary nurse practitioner on 08/19/2021.   Patient presents today for hospital follow-up.  Patient was admitted from 10/16/2021 - 10/24/2021 for acute hypoxic respiratory failure secondary to bilateral pneumonia from parainfluenza and haemophilus influenza he was on cruise in [place] when he got sick. Chest x-ray showed background pattern of scattered pulmonary scarring, superimposed acute bronchial thickening and patchy pulmonary infiltrates in the mid to lower lungs consistent with bronchopneumonia.  Blood cultures positive for haemophilus influenzae PNA.  Sputum culture rare respiratory flora, Legionella antigen negative, strep pneumonia negative.  ID consulted.  He was started on Rocephin which was transitioned to Augmentin.  Patient completed 7-day course of antibiotics per ID.  He initially required high flow nasal cannula oxygen 25 L 50% on admission.  Treated with BiPAP during hospitalization.  Echocardiogram showed normal EF, low normal RV function in the setting of pneumonia.  He received 1 dose of Lasix.  Chest x-ray showed improvement. Patient was discharged on oxygen.  Accompanied by his wife. He was doing better until recently.He has been coughing a lot more the last day or two. Cough is mainly dry, only productive in the morning. Sputum is slight yellow color. He feels short of breath when he is off oxygen. He is on 3L oxygen, desaturates when coughing. He is taking tesslon perles and delsym for cough. He has lost some weight since his vacation. He is eating and drinking fluifs.  He completed 5 day course of prednisone.     OV 02/21/2022  Subjective:  Patient ID: Joseph Powell, male , DOB: [birth year] , age 46 y.o. , MRN: [id number] , ADDRESS: [place] [address] PCP Faustino Congress, NP Patient Care Team: Faustino Congress, NP as PCP - General (Family Medicine) Sanda Klein, MD as PCP - Cardiology (Cardiology)  This Provider for this visit: Treatment Team:  Attending Provider: Brand Males, MD    02/21/2022 -   Chief Complaint  Patient presents with   Follow-up    Pt states since last visit, he has had problems with constant sinus issues and also states that he has been coughing a lot. States he did start taking Dulera which does help.    Follow-up from hospitalization in June 2023 for parainfluenza virus and haemophilus influenza pneumonia with ARDS requiring BiPAP respiratory failure  Background history of lymphoma treatment HPI Joseph Powell 66 y.o. -presents for follow-up.  Presents with his wife.  I last saw him myself in the summer 2023 in the hospital when we had to treat him with BiPAP.  Also give him hydrocortisone.  And also IV antibiotics.  Since then he is significantly improved.  He is currently off oxygen.  He did have a CT scan of the chest at [facility] end of September 2023.  The images not available for my personal visualization but per the report it appears that he has some bronchiectasis and sequelae from the pneumonia but he is improving currently is able to do a lot of his activities and not feel short of breath.  Walking desaturation test today did not show any desaturation.  Therefore he is significantly better.  Nevertheless he has \3 issues   -IgG deficiency: This has not been diagnosed posthospitalization at [facility] and has had 3 IVIG infusions  once a month.  He feels it is helping him.  -1#Schatzki ring and dysphagia.  This is getting worse.  He was previously had esophageal stretching by Dr. Owens Loffler.  He is looking for a new GI doctor.  He wants this addressed soon because his dysphagia is come back.  He is got a trip to [place] soon.  He is  willing to see [facility] or Dr. Collene Mares.  -He also has chronic cough.  He tells me the chronic cough predates the respiratory illness.  Its been going on for many years.  He says he has chronic sinus congestion.  This is currently all baseline but still persistent he has significant cough with sporadic.  Many years ago he was skin allergy test positive but never took any allergy shots.  He has never had any CT scan of the sinuses.  He does have sinus drainage and he thinks this was driving the cough.  He is open to getting investigations done towards getting rid of the cough.   Simple office walk 185 feet x  3 laps goal with forehead probe 02/21/2022    O2 used ra   Number laps completed 3   Comments about pace mod   Resting Pulse Ox/HR 99% and 68/min   Final Pulse Ox/HR 98% and 98/min   Desaturated </= 88% no   Desaturated <= 3% points no   Got Tachycardic >/= 90/min yes   Symptoms at end of test No dyspea   Miscellaneous comments x       PFT      No data to display             has a past medical history of Allergic rhinitis, Asthma, Bell's palsy, GERD (gastroesophageal reflux disease), Gout, Hypercholesteremia, Melanoma ([place]), Osteoarthritis, Sleep apnea, Thrombocytopenia ([place]), and Tubular adenoma.   reports that he has never smoked. He has been exposed to tobacco smoke. He has never used smokeless tobacco.  Past Surgical History:  Procedure Laterality Date   IR FLUORO GUIDE PORT INSERTION RIGHT  02/17/2017   IR REMOVAL TUN ACCESS W/ PORT W/O FL MOD SED  01/05/2018   IR US GUIDE VASC ACCESS RIGHT  02/17/2017   KNEE SURGERY  1993   MELANOMA EXCISION     NASAL RECONSTRUCTION     VASECTOMY  1999    No Known Allergies  Immunization History  Administered Date(s) Administered   DTaP / IPV 02/18/2018, 05/11/2018, 09/09/2018   Fluad Quad(high Dose 65+) 01/31/2021   HIB (PRP-T) 02/18/2018, 05/11/2018, 09/09/2018   Hepatitis B, PED/ADOLESCENT 02/18/2018, 05/11/2018,  12/09/2018   Influenza Split 04/01/2011   Influenza, High Dose Seasonal PF 04/05/2018, 04/07/2019   Influenza,inj,quad, With Preservative 02/03/2019   Janssen (J&J) SARS-COV-2 Vaccination 08/11/2019   MMR 09/09/2019   Moderna Sars-Covid-2 Vaccination 06/06/2019, 07/03/2019   Pneumococcal Conjugate-13 02/18/2018, 05/11/2018, 09/09/2018   Pneumococcal Polysaccharide-23 05/05/2010, 12/09/2018   Tdap 08/25/2013, 09/09/2018   Zoster Recombinat (Shingrix) 02/12/2017, 02/07/2019, 04/04/2019   Zoster, Live 01/23/2017, 01/18/2019, 04/04/2019    Family History  Problem Relation Age of Onset   Diabetes Mother    Asthma Mother    Gout Brother    Colon cancer Paternal Uncle    Esophageal cancer Neg Hx    Stomach cancer Neg Hx      Current Outpatient Medications:    benzonatate (TESSALON) 200 MG capsule, Take 1 capsule (200 mg total) by mouth 3 (three) times daily as needed for cough., Disp: 60 capsule, Rfl: 3  CALCIUM PO, Take by mouth., Disp: , Rfl:    carvedilol (COREG) 3.125 MG tablet, Take 1 tablet (3.125 mg total) by mouth 2 (two) times daily with a meal., Disp: 30 tablet, Rfl: 9   chlorpheniramine (CHLOR-TRIMETON) 4 MG tablet, Take 4 mg by mouth 2 (two) times daily as needed for allergies., Disp: , Rfl:    Cyanocobalamin (VITAMIN B-12 PO), Take by mouth., Disp: , Rfl:    mometasone-formoterol (DULERA) 100-5 MCG/ACT AERO, , Disp: , Rfl:    Vitamin D, Ergocalciferol, (DRISDOL) 1.25 MG (50000 UNIT) CAPS capsule, Take 1 capsule by mouth once a week, Disp: 12 capsule, Rfl: 0      Objective:   Vitals:   02/21/22 1310  BP: 124/76  Pulse: 71  Temp: 97.8 F (36.6 C)  TempSrc: Oral  SpO2: 97%  Weight: 197 lb (89.4 kg)  Height: _0  (1.778 m)    Estimated body mass index is 28.27 kg/m as calculated from the following:   Height as of this encounter: _1  (1.778 m).   Weight as of this encounter: 197 lb (89.4 kg).  _2 @  Filed Weights   02/21/22 1310  Weight: 197  lb (89.4 kg)     Physical Exam    General: No distress. Looks well Neuro: Alert and Oriented x 3. GCS 15. Speech normal Psych: Pleasant Resp:  Barrel Chest - no.  Wheeze - no, Crackles - YES BASE, No overt respiratory distress CVS: Normal heart sounds. Murmurs - no Ext: Stigmata of Connective Tissue Disease - no HEENT: Normal upper airway. PEERL +.  Appears to have significant nasal congestion        Assessment:       ICD-10-CM   1. History of acute respiratory failure  Z87.09     2. Pneumonia due to parainfluenza virus  J12.2     3. Chronic cough  R05.3     4. Chronic sinus complaints  R09.89     5. SCHATZKI'S RING  K22.2     6. Dysphagia, unspecified type  R13.10          Plan:     Patient Instructions  History of acute respiratory failure Pneumonia due to parainfluenza virus Basal crackles IgG deficiency  -Glad you are better.  Hypoxemia has resolved.  There is some residual crackles and based on CT scan September 2023 at [facility] it appears there is still healing going on with some residual infiltrates and bronchiectasis  Plan - Continue daily exercise; keep pulse ox greater than 88% -Can get a device such as POWERbreathe commercially or incentive spirometer and use it in order to open up your lungs -Make sure you are up-to-date with RSV, flu and COVID vaccines -Get a full pulmonary function test in 3 months -We will aim for a CT scan of the chest high-resolution in 1 year but we can decide that at follow-up    Chronic cough Chronic sinus complaints  -This is a complaint that predated your pneumonia hospitalization and requires work-up  Plan - Check CT scan of the sinus without contrast - Check CBC with differential and blood IgE and RAST allergy profile  SCHATZKI'S RING Dysphagia, unspecified type  -Seems this is flared up  Plan  - Refer [facility] or Dr. Collene Mares urgently  Follow-up - We will call you with the CT scan and blood work results  in the next few weeks - Return to see Dr. Chase Caller in a 30-minute slot in 3 months but after  pulmonary function test       SIGNATURE    Dr. Brand Males, M.D., F.C.C.P,  Pulmonary and Critical Care Medicine Staff Physician, [place] Director - Interstitial Lung Disease  Program  Pulmonary [place] at [place], [place], [address]  Pager: [phone], If no answer or between  15:00h - 7:00h: call [phone] Telephone: [phone]  1:48 PM 02/21/2022

## 2022-02-21 NOTE — Patient Instructions (Addendum)
History of acute respiratory failure Pneumonia due to parainfluenza virus Basal crackles IgG deficiency  -Glad you are better.  Hypoxemia has resolved.  There is some residual crackles and based on CT scan September 2023 at [facility] it appears there is still healing going on with some residual infiltrates and bronchiectasis  Plan - Continue daily exercise; keep pulse ox greater than 88% -Can get a device such as POWERbreathe commercially or incentive spirometer and use it in order to open up your lungs -Make sure you are up-to-date with RSV, flu and COVID vaccines -Get a full pulmonary function test in 3 months -We will aim for a CT scan of the chest high-resolution in 1 year but we can decide that at follow-up    Chronic cough Chronic sinus complaints  -This is a complaint that predated your pneumonia hospitalization and requires work-up  Plan - Check CT scan of the sinus without contrast - Check CBC with differential and blood IgE and RAST allergy profile  SCHATZKI'S RING Dysphagia, unspecified type  -Seems this is flared up  Plan  - Refer [facility] or Dr. Collene Mares urgently  Follow-up - We will call you with the CT scan and blood work results in the next few weeks - Return to see Dr. Chase Caller in a 30-minute slot in 3 months but after pulmonary function test

## 2022-02-23 ENCOUNTER — Telehealth: Payer: Self-pay | Admitting: Internal Medicine

## 2022-02-23 NOTE — Telephone Encounter (Signed)
  His plaeelt count has dropped now to moderate severe reduction even compared to 1 month ago  Plan  - he needs to contact his hematologist/oncologist   LABS    PULMONARY No results for input(s): "PHART", "PCO2ART", "PO2ART", "HCO3", "TCO2", "O2SAT" in the last 168 hours.  Invalid input(s): "PCO2", "PO2"  CBC Recent Labs  Lab 02/21/22 1711  HGB 15.7  HCT 47.4  WBC 5.8  PLT 98*    COAGULATION No results for input(s): "INR" in the last 168 hours.  CARDIAC  No results for input(s): "TROPONINI" in the last 168 hours. No results for input(s): "PROBNP" in the last 168 hours.   CHEMISTRY No results for input(s): "NA", "K", "CL", "CO2", "GLUCOSE", "BUN", "CREATININE", "CALCIUM", "MG", "PHOS" in the last 168 hours. CrCl cannot be calculated (Patient's most recent lab result is older than the maximum 21 days allowed.).   LIVER No results for input(s): "AST", "ALT", "ALKPHOS", "BILITOT", "PROT", "ALBUMIN", "INR" in the last 168 hours.   INFECTIOUS No results for input(s): "LATICACIDVEN", "PROCALCITON" in the last 168 hours.   ENDOCRINE CBG (last 3)  No results for input(s): "GLUCAP" in the last 72 hours.       IMAGING x48h  - image(s) personally visualized  -   highlighted in bold No results found.

## 2022-02-24 ENCOUNTER — Other Ambulatory Visit: Payer: Self-pay | Admitting: Hematology

## 2022-02-24 ENCOUNTER — Other Ambulatory Visit: Payer: Self-pay

## 2022-02-24 DIAGNOSIS — C8442 Peripheral T-cell lymphoma, not classified, intrathoracic lymph nodes: Secondary | ICD-10-CM

## 2022-02-24 MED ORDER — VITAMIN D (ERGOCALCIFEROL) 1.25 MG (50000 UNIT) PO CAPS: 50000.0000 [IU] | ORAL_CAPSULE | ORAL | 0 refills | Status: DC

## 2022-02-24 NOTE — Telephone Encounter (Signed)
Called and spoke with pt letting him know the info from MR and he verbalized understanding. Nothing further needed.

## 2022-02-25 LAB — RESPIRATORY ALLERGY PROFILE REGION II ~~LOC~~

## 2022-02-25 LAB — INTERPRETATION:

## 2022-02-26 ENCOUNTER — Other Ambulatory Visit: Payer: Self-pay | Admitting: Physician Assistant

## 2022-02-26 DIAGNOSIS — R1310 Dysphagia, unspecified: Secondary | ICD-10-CM | POA: Diagnosis not present

## 2022-03-04 ENCOUNTER — Ambulatory Visit
Admission: RE | Admit: 2022-03-04 | Discharge: 2022-03-04 | Disposition: A | Discharge status: Home / Self Care | Payer: BC Managed Care – PPO | Source: Ambulatory Visit | Attending: Physician Assistant | Admitting: Physician Assistant

## 2022-03-04 DIAGNOSIS — K222 Esophageal obstruction: Secondary | ICD-10-CM | POA: Diagnosis not present

## 2022-03-04 DIAGNOSIS — R1310 Dysphagia, unspecified: Secondary | ICD-10-CM

## 2022-03-07 ENCOUNTER — Other Ambulatory Visit: Payer: Self-pay | Admitting: Internal Medicine

## 2022-03-11 ENCOUNTER — Other Ambulatory Visit: Payer: Self-pay | Admitting: Adult Health

## 2022-03-11 ENCOUNTER — Other Ambulatory Visit: Payer: Self-pay | Admitting: Internal Medicine

## 2022-03-12 ENCOUNTER — Telehealth: Payer: Self-pay | Admitting: Internal Medicine

## 2022-03-12 MED ORDER — BENZONATATE 200 MG PO CAPS: 200.0000 mg | ORAL_CAPSULE | Freq: Three times a day (TID) | ORAL | 3 refills | Status: DC | PRN

## 2022-03-12 NOTE — Telephone Encounter (Signed)
Dr. Chase Caller, Please advise if ok to refill Benzonatate tablets.  Patient called requesting a refill and is leaving town today.  Thank you.  ATC patient regarding refill for benzonatate.  LVM to get clarification if this is for the cough we have been treating or a new cough.  [place] and verified that he has no remaining refills on his benzonatate tablets.

## 2022-03-12 NOTE — Telephone Encounter (Signed)
Dr. Chase Caller stated ok to refill.  Refill sent to pharmacy.  Will await return call from patient.

## 2022-03-17 NOTE — Telephone Encounter (Signed)
I believ I signed it. Closing message

## 2022-03-20 DIAGNOSIS — D124 Benign neoplasm of descending colon: Secondary | ICD-10-CM | POA: Diagnosis not present

## 2022-03-20 DIAGNOSIS — Z86010 Personal history of colonic polyps: Secondary | ICD-10-CM | POA: Diagnosis not present

## 2022-03-20 DIAGNOSIS — K219 Gastro-esophageal reflux disease without esophagitis: Secondary | ICD-10-CM | POA: Diagnosis not present

## 2022-03-20 DIAGNOSIS — Z09 Encounter for follow-up examination after completed treatment for conditions other than malignant neoplasm: Secondary | ICD-10-CM | POA: Diagnosis not present

## 2022-03-20 DIAGNOSIS — K222 Esophageal obstruction: Secondary | ICD-10-CM | POA: Diagnosis not present

## 2022-03-20 DIAGNOSIS — K648 Other hemorrhoids: Secondary | ICD-10-CM | POA: Diagnosis not present

## 2022-03-20 DIAGNOSIS — R1310 Dysphagia, unspecified: Secondary | ICD-10-CM | POA: Diagnosis not present

## 2022-03-20 DIAGNOSIS — D123 Benign neoplasm of transverse colon: Secondary | ICD-10-CM | POA: Diagnosis not present

## 2022-03-26 ENCOUNTER — Other Ambulatory Visit: Payer: BC Managed Care – PPO

## 2022-03-31 DIAGNOSIS — H6993 Unspecified Eustachian tube disorder, bilateral: Secondary | ICD-10-CM | POA: Diagnosis not present

## 2022-04-04 ENCOUNTER — Ambulatory Visit
Admission: RE | Admit: 2022-04-04 | Discharge: 2022-04-04 | Disposition: A | Discharge status: Home / Self Care | Payer: BC Managed Care – PPO | Source: Ambulatory Visit | Attending: Internal Medicine | Admitting: Internal Medicine

## 2022-04-04 DIAGNOSIS — J329 Chronic sinusitis, unspecified: Secondary | ICD-10-CM | POA: Diagnosis not present

## 2022-04-04 DIAGNOSIS — J3489 Other specified disorders of nose and nasal sinuses: Secondary | ICD-10-CM | POA: Diagnosis not present

## 2022-04-04 DIAGNOSIS — R0989 Other specified symptoms and signs involving the circulatory and respiratory systems: Secondary | ICD-10-CM

## 2022-04-04 DIAGNOSIS — J342 Deviated nasal septum: Secondary | ICD-10-CM | POA: Diagnosis not present

## 2022-05-01 ENCOUNTER — Telehealth: Payer: Self-pay | Admitting: Internal Medicine

## 2022-05-01 NOTE — Telephone Encounter (Signed)
If it has been ongoing for months and is not responsive to conservative OTC measures, please schedule him next available OV for further evaluation. Thanks.

## 2022-05-01 NOTE — Telephone Encounter (Signed)
Spoke with pt and scheduled for OV on 05/19/22 with NP Geraldo Pitter. Pt stated understanding. Nothing further needed at this time.

## 2022-05-01 NOTE — Telephone Encounter (Signed)
Spoke with pt who states he has had a dry cough for months (since at least May) which OTC Delsym and tessalon pearls are not helping. Pt states he does have some nasal drainage. Pt denies fever/ chills/ GI upset. Joellen [place] can you please advise as Dr. Chase Caller is unavailable.

## 2022-05-07 ENCOUNTER — Encounter: Payer: Self-pay | Admitting: Primary Care

## 2022-05-07 ENCOUNTER — Ambulatory Visit: Payer: No Typology Code available for payment source | Admitting: Primary Care

## 2022-05-07 VITALS — BP 122/80 | HR 87 | Temp 98.2°F | Ht 71.0 in | Wt 197.4 lb

## 2022-05-07 DIAGNOSIS — J329 Chronic sinusitis, unspecified: Secondary | ICD-10-CM

## 2022-05-07 DIAGNOSIS — J45991 Cough variant asthma: Secondary | ICD-10-CM | POA: Diagnosis not present

## 2022-05-07 DIAGNOSIS — R053 Chronic cough: Secondary | ICD-10-CM | POA: Diagnosis not present

## 2022-05-07 MED ORDER — AZELASTINE-FLUTICASONE 137-50 MCG/ACT NA SUSP: 2.0000 | Freq: Two times a day (BID) | NASAL | 1 refills | Status: DC

## 2022-05-07 MED ORDER — AMOXICILLIN-POT CLAVULANATE 875-125 MG PO TABS: 1.0000 | ORAL_TABLET | Freq: Two times a day (BID) | ORAL | 0 refills | Status: DC

## 2022-05-07 MED ORDER — FLUTICASONE-SALMETEROL 250-50 MCG/ACT IN AEPB: 1.0000 | INHALATION_SPRAY | Freq: Two times a day (BID) | RESPIRATORY_TRACT | 3 refills | Status: DC

## 2022-05-07 MED ORDER — BENZONATATE 200 MG PO CAPS: 200.0000 mg | ORAL_CAPSULE | Freq: Three times a day (TID) | ORAL | 3 refills | Status: DC | PRN

## 2022-05-07 NOTE — Progress Notes (Addendum)
@Patient  ID: Joseph Powell, male    DOB: 1956/04/15, 67 y.o.   MRN: [id number]  Chief Complaint  Patient presents with   Follow-up    Dry Chronic cough,Phlegm (light yellowish and greenish tint),Congestion right ear clogged feeling .     Referring provider: Moshe Cipro, NP  HPI: 67 year old male, never smoked.  Has medical history significant for asthma, multifocal pneumonia, seasonal allergic rhinitis, OSA, coronary artery disease, type 2 diabetes, thrombocytopenia, hyperlipidemia.  Patient of Dr. Marchelle Gearing.  Previous [facility] pulmonary encounter: 02/21/2022 -   Chief Complaint  Patient presents with   Follow-up    Pt states since last visit, he has had problems with constant sinus issues and also states that he has been coughing a lot. States he did start taking Dulera which does help.    Follow-up from hospitalization in June 2023 for parainfluenza virus and haemophilus influenza pneumonia with ARDS requiring BiPAP respiratory failure  Background history of lymphoma treatment HPI Joseph Powell 67 y.o. -presents for follow-up.  Presents with his wife.  I last saw him myself in the summer 2023 in the hospital when we had to treat him with BiPAP.  Also give him hydrocortisone.  And also IV antibiotics.  Since then he is significantly improved.  He is currently off oxygen.  He did have a CT scan of the chest at [facility] end of September 2023.  The images not available for my personal visualization but per the report it appears that he has some bronchiectasis and sequelae from the pneumonia but he is improving currently is able to do a lot of his activities and not feel short of breath.  Walking desaturation test today did not show any desaturation.  Therefore he is significantly better.  Nevertheless he has \\3  issues   -IgG deficiency: This has not been diagnosed posthospitalization at [facility] and has had 3 IVIG infusions once a month.  He feels it is  helping him.  -1#Schatzki ring and dysphagia.  This is getting worse.  He was previously had esophageal stretching by Dr. Rob Bunting.  He is looking for a new GI doctor.  He wants this addressed soon because his dysphagia is come back.  He is got a trip to [place] soon.  He is willing to see [facility] or Dr. Loreta Ave.  -He also has chronic cough.  He tells me the chronic cough predates the respiratory illness.  Its been going on for many years.  He says he has chronic sinus congestion.  This is currently all baseline but still persistent he has significant cough with sporadic.  Many years ago he was skin allergy test positive but never took any allergy shots.  He has never had any CT scan of the sinuses.  He does have sinus drainage and he thinks this was driving the cough.  He is open to getting investigations done towards getting rid of the cough.   Simple office walk 185 feet x  3 laps goal with forehead probe 02/21/2022   O2 used ra  Number laps completed 3  Comments about pace mod  Resting Pulse Ox/HR 99% and 68/min  Final Pulse Ox/HR 98% and 98/min  Desaturated </= 88% no  Desaturated <= 3% points no  Got Tachycardic >/= 90/min yes  Symptoms at end of test No dyspea  Miscellaneous comments x     05/07/2022 - Interim hx Hx acute respiratory failure, pneumonia d/t parainfluenza virus, chronic cough, chronic rhinitis  CT scan in September 2023 at [facility] residual infiltrates and bronchiectasis Ordered for CT sinuses and allergy panel  Encouraged patient use incentive spirometer  Follow-up PFTs in 3 months and repeat HRCT in 1 year   Patient presents today for 3 month follow-up. He has had a dry cough for several months. Cough is at times productive with dark green mucus. No associated shortness of breath. Tessalon pearles and delsym cough syrup not helping. CT sinuses showed mild mucosal thickening or maxillary sinuses, right greater than left, with obstruction right osteomeatal complex.  Partial opacification of right mastoid air cells. Allergy panel was normal. He has not used nasal spray in a couple of weeks  Advised to follow-up with hematology d/t thrombocytopenia   No Known Allergies  Immunization History  Administered Date(s) Administered   DTaP / IPV 02/18/2018, 05/11/2018, 09/09/2018   Fluad Quad(high Dose 65+) 01/31/2021, 02/21/2022   HIB (PRP-T) 02/18/2018, 05/11/2018, 09/09/2018   Hepatitis B, PED/ADOLESCENT 02/18/2018, 05/11/2018, 12/09/2018   Influenza Split 04/01/2011   Influenza, High Dose Seasonal PF 04/05/2018, 04/07/2019   Influenza,inj,quad, With Preservative 02/03/2019   Janssen (J&J) SARS-COV-2 Vaccination 08/11/2019   MMR 09/09/2019   Moderna Sars-Covid-2 Vaccination 06/06/2019, 07/03/2019   Pneumococcal Conjugate-13 02/18/2018, 05/11/2018, 09/09/2018   Pneumococcal Polysaccharide-23 05/05/2010, 12/09/2018   Tdap 08/25/2013, 09/09/2018   Zoster Recombinat (Shingrix) 02/12/2017, 02/07/2019, 04/04/2019   Zoster, Live 01/23/2017, 01/18/2019, 04/04/2019    Past Medical History:  Diagnosis Date   Allergic rhinitis    Asthma    slight   Bell's palsy    GERD (gastroesophageal reflux disease)    Gout    Hypercholesteremia    Melanoma (HCC)    Osteoarthritis    Sleep apnea    Resolving   Thrombocytopenia (HCC)    Tubular adenoma     Tobacco History: Social History   Tobacco Use  Smoking Status Never   Passive exposure: Past  Smokeless Tobacco Never   Counseling given: Not Answered   Outpatient Medications Prior to Visit  Medication Sig Dispense Refill   CALCIUM PO Take by mouth.     Cyanocobalamin (VITAMIN B-12 PO) Take by mouth.     Vitamin D, Ergocalciferol, (DRISDOL) 1.25 MG (50000 UNIT) CAPS capsule Take 1 capsule (50,000 Units total) by mouth once a week. 12 capsule 0   benzonatate (TESSALON) 200 MG capsule Take 1 capsule (200 mg total) by mouth 3 (three) times daily as needed for cough. 60 capsule 3   carvedilol (COREG)  3.125 MG tablet Take 1 tablet (3.125 mg total) by mouth 2 (two) times daily with a meal. 30 tablet 9   chlorpheniramine (CHLOR-TRIMETON) 4 MG tablet Take 4 mg by mouth 2 (two) times daily as needed for allergies.     mometasone-formoterol (DULERA) 100-5 MCG/ACT AERO Inhale 2 puffs by mouth twice daily 13 g 11   No facility-administered medications prior to visit.      Review of Systems  Review of Systems  HENT:  Positive for congestion and postnasal drip.   Respiratory:  Positive for cough.    Physical Exam  BP 122/80 (BP Location: Left Arm, Patient Position: Sitting, Cuff Size: Normal)   Pulse 87   Temp 98.2 F (36.8 C) (Oral)   Ht 5\' 11"  (1.803 m)   Wt 197 lb 6.4 oz (89.5 kg)   SpO2 96%   BMI 27.53 kg/m  Physical Exam Constitutional:      Appearance: Normal appearance.  HENT:     Head: Normocephalic and atraumatic.  Ears:     Comments: Tube left TM     Mouth/Throat:     Mouth: Mucous membranes are moist.     Pharynx: Oropharynx is clear.  Pulmonary:     Breath sounds: No rhonchi or rales.     Comments: Faint wheeze left upper lobe, otherwise clear Congested cough  Neurological:     Mental Status: He is alert.      Lab Results:  CBC    Component Value Date/Time   WBC 5.8 02/21/2022 1711   RBC 5.03 02/21/2022 1711   HGB 15.7 02/21/2022 1711   HGB 14.3 12/27/2021 1207   HGB 8.6 (L) 05/01/2017 0815   HCT 47.4 02/21/2022 1711   HCT 27.6 (L) 05/01/2017 0815   PLT 98 (L) 02/21/2022 1711   PLT 129 (L) 12/27/2021 1207   PLT 30 (L) 05/01/2017 0815   MCV 94.2 02/21/2022 1711   MCV 87.3 05/01/2017 0815   MCH 31.2 02/21/2022 1711   MCHC 33.1 02/21/2022 1711   RDW 12.2 02/21/2022 1711   RDW 18.4 (H) 05/01/2017 0815   LYMPHSABS 1,114 02/21/2022 1711   LYMPHSABS 1.0 05/01/2017 0815   MONOABS 1.7 (H) 12/27/2021 1207   MONOABS 1.8 (H) 05/01/2017 0815   EOSABS 29 02/21/2022 1711   EOSABS 0.0 05/01/2017 0815   BASOSABS 12 02/21/2022 1711   BASOSABS 0.3 (H)  05/01/2017 0815    BMET    Component Value Date/Time   NA 141 12/27/2021 1207   NA 137 05/01/2017 0815   K 4.4 12/27/2021 1207   K 3.6 05/01/2017 0815   CL 108 12/27/2021 1207   CO2 27 12/27/2021 1207   CO2 26 05/01/2017 0815   GLUCOSE 137 (H) 12/27/2021 1207   GLUCOSE 223 (H) 05/01/2017 0815   BUN 12 12/27/2021 1207   BUN 5.6 (L) 05/01/2017 0815   CREATININE 0.68 12/27/2021 1207   CREATININE 0.7 05/01/2017 0815   CALCIUM 9.4 12/27/2021 1207   CALCIUM 8.8 05/01/2017 0815   GFRNONAA >60 12/27/2021 1207   GFRAA >60 01/25/2020 1431    BNP    Component Value Date/Time   BNP 372.0 (H) 10/17/2021 0507    ProBNP    Component Value Date/Time   PROBNP 120.0 (H) 10/31/2021 1336    Imaging: CT CARDIAC SCORING (SELF PAY ONLY)  Addendum Date: 08/09/2022   ADDENDUM REPORT: 08/09/2022 23:06 EXAM: OVER-READ INTERPRETATION  CT CHEST The following report is an over-read performed by radiologist Dr. Curly Shores [facility] on 08/09/2022. This over-read does not include interpretation of cardiac or coronary anatomy or pathology. The coronary calcium score interpretation by the cardiologist is attached. COMPARISON:  10/17/2021 FINDINGS: Cardiovascular: Atheromatous calcifications aorta and coronary arteries. Findings discussed in the body of the report. Mediastinum/Nodes: No suspicious adenopathy identified. Imaged mediastinal structures are unremarkable. Lungs/Pleura: Bibasilar linear scarring or subsegmental atelectasis. No pneumonia or pulmonary edema. No pleural effusion or pneumothorax. Upper Abdomen: No acute abnormality. Musculoskeletal: Minimal bilateral gynecomastia. No acute or significant osseous findings. IMPRESSION: Bibasilar subsegmental atelectasis or scarring. Minimal bilateral gynecomastia. Electronically Signed   By: Layla Maw M.D.   On: 08/09/2022 23:06   Result Date: 08/09/2022 CLINICAL DATA:  Risk stratification EXAM: Coronary Calcium Score TECHNIQUE:  The patient was scanned on a Siemens Somatom 64 slice scanner. Axial non-contrast 3mm slices were carried out through the heart. The data set was analyzed on a dedicated work station and scored using the Agatson method. FINDINGS: Non-cardiac: No significant non cardiac findings on limited lung and  soft tissue windows. See separate report from [facility]. Ascending Aorta: Normal diameter 3.6 cm Pericardium: Normal Coronary arteries: 3 vessel calcium noted LM 0 LAD 594 RCA 1.83 LCX 1460 Total 2056 IMPRESSION: Coronary calcium score of 2056. This was 1 th percentile for age and sex matched control. Consider f/u perfusion study given how high score is Somewhat difficult to distinguish calcium in mitral annulus from LCX Charlton Haws Electronically Signed: By: Charlton Haws M.D. On: 08/07/2022 12:19     Assessment & Plan:   Cough variant asthma vs UACS - Patient has had a dry cough for several months. No associated shortness of breath. Tessalon and Delsym not helping. Changing Dulera to [employer]. Keep apt with Dr. Debroah Baller    Sinusitis - CT sinuses in Dec 2023 with mild mucosal thickening R>L with obstruction right osteomeatal complex.  Partial opacification of right mastoid air cells. Allergy panel negative.   Recommendations: Start mucinex 600mg  twice daily Start nasal spray morning and evening Check sputum sample  Rx: Dymista nasal spray 2 sprays per nostril twice daily Augmentin 1 tablet twice daily x 10 days  Refer back to ENT      Glenford Bayley, NP 09/01/2022

## 2022-05-07 NOTE — Patient Instructions (Addendum)
Recommendations: Start mucinex '600mg'$  twice daily Start nasal spray morning and evening Changing Dulera to [place] sputum sample  Call and follow up with ENT   Rx: Dymista nasal spray 2 sprays per nostril twice daily Augmentin 1 tablet twice daily x 10 days   Orders: Sputum sample   Follow-up Keep apt with Dr. Rolland Bimler

## 2022-05-09 ENCOUNTER — Other Ambulatory Visit: Payer: No Typology Code available for payment source

## 2022-05-09 DIAGNOSIS — R053 Chronic cough: Secondary | ICD-10-CM

## 2022-05-13 ENCOUNTER — Telehealth: Payer: Self-pay | Admitting: Internal Medicine

## 2022-05-13 LAB — RESPIRATORY CULTURE OR RESPIRATORY AND SPUTUM CULTURE
MICRO NUMBER:: 14394369
SPECIMEN QUALITY:: ADEQUATE

## 2022-05-14 MED ORDER — BENZONATATE 200 MG PO CAPS: 200.0000 mg | ORAL_CAPSULE | Freq: Three times a day (TID) | ORAL | 3 refills | Status: DC | PRN

## 2022-05-14 MED ORDER — LEVOFLOXACIN 500 MG PO TABS: 500.0000 mg | ORAL_TABLET | Freq: Every day | ORAL | 0 refills | Status: DC

## 2022-05-14 MED ORDER — FLUTICASONE-SALMETEROL 250-50 MCG/ACT IN AEPB: 1.0000 | INHALATION_SPRAY | Freq: Two times a day (BID) | RESPIRATORY_TRACT | 3 refills | Status: DC

## 2022-05-14 NOTE — Telephone Encounter (Signed)
Called and spoke to patient and went over sputum results with him. He verbalized understanding to stop the Augmentin and to start Levaquin. 1 tablet daily for 10 days. Nothing further needed

## 2022-05-14 NOTE — Telephone Encounter (Signed)
Yes I got sputum back today it showed moderate growth of a drug resistant bacteria - Stenotrophomonas maltophilia. Him stop Augmentin and please send in levaquin '500mg'$  daily x 10 days

## 2022-05-14 NOTE — Telephone Encounter (Signed)
Called patient and verified that he was wanting 90 supply on his inhaler and tessalon pearls. Verified pharmacy.   He is also asking if his results from his sputum sample came back yet.  Please advise

## 2022-05-19 ENCOUNTER — Ambulatory Visit: Payer: BC Managed Care – PPO | Admitting: Primary Care

## 2022-06-03 ENCOUNTER — Ambulatory Visit: Payer: BC Managed Care – PPO | Admitting: Internal Medicine

## 2022-06-26 ENCOUNTER — Ambulatory Visit: Payer: No Typology Code available for payment source | Admitting: Internal Medicine

## 2022-06-26 ENCOUNTER — Encounter: Payer: Self-pay | Admitting: Internal Medicine

## 2022-06-26 VITALS — BP 104/68 | HR 67 | Temp 97.6°F | Ht 70.0 in | Wt 187.6 lb

## 2022-06-26 DIAGNOSIS — Z8709 Personal history of other diseases of the respiratory system: Secondary | ICD-10-CM

## 2022-06-26 DIAGNOSIS — R0989 Other specified symptoms and signs involving the circulatory and respiratory systems: Secondary | ICD-10-CM

## 2022-06-26 DIAGNOSIS — R053 Chronic cough: Secondary | ICD-10-CM | POA: Diagnosis not present

## 2022-06-26 DIAGNOSIS — K222 Esophageal obstruction: Secondary | ICD-10-CM | POA: Diagnosis not present

## 2022-06-26 NOTE — Patient Instructions (Signed)
Full PFT was performed today.

## 2022-06-26 NOTE — Patient Instructions (Addendum)
IgG deficiency History of acute respiratory failure and Pneumonia due to parainfluenza virus - Summer 2023 Stenotrophomonas infection - Jan 2023  -Glad  cough has improved after antibiotics in Jan 2023 and also treatment for sinus (Below) - Unclear why beyond IgG deficiency you get unusual infections - PFT shows mild to moderate reduction in diffusion suggesting residual scar from hospitalization  Plan - Continue daily exercise; keep pulse ox greater than 88% -Can get a device such as POWERbreathe commercially or incentive spirometer and use it in order to open up your lungs -Get HRCT in Aug 2024 (1 year CT)  -monitor siutation -if it keeps happening might need ID consultation    Chronic sinus complaints- abnormal CT sinus Fall 2023 Negative RAST allergen panel - in 2023  - improved after treatment with nasal steroids  Plan - please followup with Dr Constance Holster to make sure you do not need additioal intevention  - continue nasal spray as before  SCHATZKI'S RING Dysphagia, unspecified type  -Seems thi is improved after stretch by Dr Paulita Fujita  Plan  - per [facility]  Chronic cough    - due to all of above; glad better/nearly resolved    Follow-up - Aug 2024 after CT chest

## 2022-06-26 NOTE — Progress Notes (Signed)
Full PFT performed today. °

## 2022-06-26 NOTE — Progress Notes (Signed)
10/31/2021 67-year-old male, never smoked.  Past medical history significant for OSA, multifocal pneumonia, cough variant asthma, acute respiratory failure with hypoxia, type 2 diabetes.  Patient of Dr. Melvyn Novas, last seen by pulmonary nurse practitioner on 08/19/2021.   Patient presents today for hospital follow-up.  Patient was admitted from 10/16/2021 - 10/24/2021 for acute hypoxic respiratory failure secondary to bilateral pneumonia from parainfluenza and haemophilus influenza he was on cruise in [place] when he got sick. Chest x-ray showed background pattern of scattered pulmonary scarring, superimposed acute bronchial thickening and patchy pulmonary infiltrates in the mid to lower lungs consistent with bronchopneumonia.  Blood cultures positive for haemophilus influenzae PNA.  Sputum culture rare respiratory flora, Legionella antigen negative, strep pneumonia negative.  ID consulted.  He was started on Rocephin which was transitioned to Augmentin.  Patient completed 7-day course of antibiotics per ID.  He initially required high flow nasal cannula oxygen 25 L 50% on admission.  Treated with BiPAP during hospitalization.  Echocardiogram showed normal EF, low normal RV function in the setting of pneumonia.  He received 1 dose of Lasix.  Chest x-ray showed improvement. Patient was discharged on oxygen.  Accompanied by his wife. He was doing better until recently.He has been coughing a lot more the last day or two. Cough is mainly dry, only productive in the morning. Sputum is slight yellow color. He feels short of breath when he is off oxygen. He is on 3L oxygen, desaturates when coughing. He is taking tesslon perles and delsym for cough. He has lost some weight since his vacation. He is eating and drinking fluifs.  He completed 5 day course of prednisone.     OV 02/21/2022  Subjective:  Patient ID: Joseph Powell, male , DOB: 08-21-1955 , age 67 y.o. , MRN: [id number] , ADDRESS: [place] [id number] PCP Faustino Congress, NP Patient Care Team: Faustino Congress, NP as PCP - General (Family Medicine) Sanda Klein, MD as PCP - Cardiology (Cardiology)  This Provider for this visit: Treatment Team:  Attending Provider: Brand Males, MD    02/21/2022 -   Chief Complaint  Patient presents with   Follow-up    Pt states since last visit, he has had problems with constant sinus issues and also states that he has been coughing a lot. States he did start taking Dulera which does help.    Follow-up from hospitalization in June 2023 for parainfluenza virus and haemophilus influenza pneumonia with ARDS requiring BiPAP respiratory failure  Background history of lymphoma treatment   HPI Joseph Powell 67 y.o. -presents for follow-up.  Presents with his wife.  I last saw him myself in the summer 2023 in the hospital when we had to treat him with BiPAP.  Also give him hydrocortisone.  And also IV antibiotics.  Since then he is significantly improved.  He is currently off oxygen.  He did have a CT scan of the chest at [facility] end of September 2023.  The images not available for my personal visualization but per the report it appears that he has some bronchiectasis and sequelae from the pneumonia but he is improving currently is able to do a lot of his activities and not feel short of breath.  Walking desaturation test today did not show any desaturation.  Therefore he is significantly better.  Nevertheless he has \3 issues   -IgG deficiency: This has not been diagnosed posthospitalization at [facility] and has had 3 IVIG  infusions once a month.  He feels it is helping him.  -1#Schatzki ring and dysphagia.  This is getting worse.  He was previously had esophageal stretching by Dr. Owens Loffler.  He is looking for a new GI doctor.  He wants this addressed soon because his dysphagia is come back.  He is got a trip to [place] soon.  He  is willing to see [facility] or Dr. Collene Mares.  -He also has chronic cough.  He tells me the chronic cough predates the respiratory illness.  Its been going on for many years.  He says he has chronic sinus congestion.  This is currently all baseline but still persistent he has significant cough with sporadic.  Many years ago he was skin allergy test positive but never took any allergy shots.  He has never had any CT scan of the sinuses.  He does have sinus drainage and he thinks this was driving the cough.  He is open to getting investigations done towards getting rid of the cough.  Jan 2024 with APP    05/07/2022 - interim hx Hx acute respiratory failure, pneumonia d/t parainfluenza virus, chronic cough, chronic rhinitis  CT scan in September 2023 at [facility] residual infiltrates and bronchiectasis Ordered for CT sinuses and allergy panel  Encouraged patient use incentive spirometer  Follow-up PFTs in 3 months and repeat HRCT in 1 year   Patient presents today for 3 month follow-up He has had a dry cough for several months Tessalon pearles and delsym cough syrup not helping   CT sinuses showed mild mucosal thickening or maxillary sinuses, right greater than left, with obstruction right ostiomeatal complex. Partial opacification of right mastoid air cells.   Allergy panel was normal   Advised to follow-up with hematology d/t thrombocytopenia \  He has not used nasal spray in a couple of weeks Tubes  He has a chronic productive cough with dark green tinge  No shortness of breath    OV 06/26/2022  Subjective:  Patient ID: Joseph Powell, male , DOB: 1956-01-18 , age 67 y.o. , MRN: [id number] , ADDRESS: [place] [id number] PCP Faustino Congress, NP Patient Care Team: Faustino Congress, NP as PCP - General (Family Medicine) Sanda Klein, MD as PCP - Cardiology (Cardiology)  This Provider for this visit: Treatment Team:  Attending Provider: Brand Males,  MD  Chronic cough that preceded the below hospitalization  Chronic sinusitis  -Fall 2023 mastoid and other sinus congestion  -RAST allergy panel - fall 2023  Background history of lymphoma treatment  Schatzki's ring  Chronic thrombocytopenia -onset 2012  Follow-up from hospitalization in June 2023 for parainfluenza virus and haemophilus influenza pneumonia with ARDS requiring BiPAP respiratory failure  -Subsequently diagnosed with IgG deficiency     06/26/2022 -   Chief Complaint  Patient presents with   Follow-up    Pft review, no c/o      HPI Joseph Powell 67 y.o. -returns for follow-up.  He presents with his wife.  He says he is doing well.  I saw him in October 2023.  At that time because of his pre-existing chronic cough ordered CT scan of the sinus and also RAST allergy panel.  He followed up with Derl Barrow in January 2024.  CT scan of the sinus showed congestion.  RAST allergy panel was negative.  She ordered sputum culture that subsequently grew stenotrophomonas.  She had a more nasal spray because of his chronic cough and chronic rhinitis.  After that she called in [place].  He says with all this the cough is dramatically improved.  It is nearly resolved.  In fact his pre-existing baseline cough even before I knew him has nearly resolved.  He continues on his inhaled steroids which has been for many years.  He is positive by the fact he grew stenotrophomonas.  His transplant physician at [facility] is aware of this they have increased the frequency of IgG.  Currently is not having any colored sputum.  His last CT scan of the chest was in September 2023.  He had pulmonary function test today.  Shows isolated reduction in DLCO at a mild to moderate level at 63%.  His effort tolerance is really good. O exam today - could not hear crackles as before    Simple office walk 185 feet x  3 laps goal with forehead probe 02/21/2022    O2 used ra   Number laps completed 3    Comments about pace mod   Resting Pulse Ox/HR 99% and 68/min   Final Pulse Ox/HR 98% and 98/min   Desaturated </= 88% no   Desaturated <= 3% points no   Got Tachycardic >/= 90/min yes   Symptoms at end of test No dyspea   Miscellaneous comments x       has a past medical history of Allergic rhinitis, Asthma, Bell's palsy, GERD (gastroesophageal reflux disease), Gout, Hypercholesteremia, Melanoma ([place]), Osteoarthritis, Sleep apnea, Thrombocytopenia ([place]), and Tubular adenoma.   reports that he has never smoked. He has been exposed to tobacco smoke. He has never used smokeless tobacco.  Past Surgical History:  Procedure Laterality Date   IR FLUORO GUIDE PORT INSERTION RIGHT  02/17/2017   IR REMOVAL TUN ACCESS W/ PORT W/O FL MOD SED  01/05/2018   IR US GUIDE VASC ACCESS RIGHT  02/17/2017   KNEE SURGERY  1993   MELANOMA EXCISION     NASAL RECONSTRUCTION     VASECTOMY  1999    No Known Allergies  Immunization History  Administered Date(s) Administered   DTaP / IPV 02/18/2018, 05/11/2018, 09/09/2018   Fluad Quad(high Dose 65+) 01/31/2021, 02/21/2022   HIB (PRP-T) 02/18/2018, 05/11/2018, 09/09/2018   Hepatitis B, PED/ADOLESCENT 02/18/2018, 05/11/2018, 12/09/2018   Influenza Split 04/01/2011   Influenza, High Dose Seasonal PF 04/05/2018, 04/07/2019   Influenza,inj,quad, With Preservative 02/03/2019   Janssen (J&J) SARS-COV-2 Vaccination 08/11/2019   MMR 09/09/2019   Moderna Sars-Covid-2 Vaccination 06/06/2019, 07/03/2019   Pneumococcal Conjugate-13 02/18/2018, 05/11/2018, 09/09/2018   Pneumococcal Polysaccharide-23 05/05/2010, 12/09/2018   Tdap 08/25/2013, 09/09/2018   Zoster Recombinat (Shingrix) 02/12/2017, 02/07/2019, 04/04/2019   Zoster, Live 01/23/2017, 01/18/2019, 04/04/2019    Family History  Problem Relation Age of Onset   Diabetes Mother    Asthma Mother    Gout Brother    Colon cancer Paternal Uncle    Esophageal cancer Neg Hx    Stomach cancer Neg Hx       Current Outpatient Medications:    Azelastine-Fluticasone 137-50 MCG/ACT SUSP, Place 2 puffs into the nose in the morning and at bedtime., Disp: 23 g, Rfl: 1   CALCIUM PO, Take by mouth., Disp: , Rfl:    carvedilol (COREG) 3.125 MG tablet, Take 1 tablet (3.125 mg total) by mouth 2 (two) times daily with a meal., Disp: 30 tablet, Rfl: 9   Cyanocobalamin (VITAMIN B-12 PO), Take by mouth., Disp: , Rfl:    fluticasone-salmeterol (WIXELA INHUB) 250-50 MCG/ACT AEPB, Inhale 1 puff into the lungs  in the morning and at bedtime., Disp: 3 each, Rfl: 3   Vitamin D, Ergocalciferol, (DRISDOL) 1.25 MG (50000 UNIT) CAPS capsule, Take 1 capsule (50,000 Units total) by mouth once a week., Disp: 12 capsule, Rfl: 0   amoxicillin-clavulanate (AUGMENTIN) 875-125 MG tablet, Take 1 tablet by mouth 2 (two) times daily., Disp: 20 tablet, Rfl: 0   benzonatate (TESSALON) 200 MG capsule, Take 1 capsule (200 mg total) by mouth 3 (three) times daily as needed for cough., Disp: 270 capsule, Rfl: 3   chlorpheniramine (CHLOR-TRIMETON) 4 MG tablet, Take 4 mg by mouth 2 (two) times daily as needed for allergies., Disp: , Rfl:    levofloxacin (LEVAQUIN) 500 MG tablet, Take 1 tablet (500 mg total) by mouth daily., Disp: 10 tablet, Rfl: 0      Objective:   Vitals:   06/26/22 0951  BP: 104/68  Pulse: 67  Temp: 97.6 F (36.4 C)  TempSrc: Oral  SpO2: 98%  Weight: 187 lb 9.6 oz (85.1 kg)  Height: 5' 10"$  (1.778 m)    Estimated body mass index is 26.92 kg/m as calculated from the following:   Height as of this encounter: 5' 10"$  (1.778 m).   Weight as of this encounter: 187 lb 9.6 oz (85.1 kg).  @WEIGHTCHANGE$ @  [employer]   06/26/22 0951  Weight: 187 lb 9.6 oz (85.1 kg)     Physical Exam   General: No distress. Looks well Neuro: Alert and Oriented x 3. GCS 15. Speech normal Psych: Pleasant Resp:  Barrel Chest - no.  Wheeze - no, Crackles - no, No overt respiratory distress CVS: Normal heart sounds.  Murmurs - no Ext: Stigmata of Connective Tissue Disease - no HEENT: Normal upper airway. PEERL +. No post nasal drip        Assessment:       ICD-10-CM   1. Chronic cough  R05.3     2. Chronic sinus complaints  R09.89     3. SCHATZKI'S RING  K22.2          Plan:     Patient Instructions  IgG deficiency History of acute respiratory failure and Pneumonia due to parainfluenza virus - Summer 2023 Stenotrophomonas infection - Jan 2023  -Glad  cough has improved after antibiotics in Jan 2023 and also treatment for sinus (Below) - Unclear why beyond IgG deficiency you get unusual infections - PFT shows mild to moderate reduction in diffusion suggesting residual scar from hospitalization  Plan - Continue daily exercise; keep pulse ox greater than 88% -Can get a device such as POWERbreathe commercially or incentive spirometer and use it in order to open up your lungs -Get HRCT in Aug 2024 (1 year CT)  -monitor siutation -if it keeps happening might need ID consultation    Chronic sinus complaints- abnormal CT sinus Fall 2023 Negative RAST allergen panel - in 2023  - improved after treatment with nasal steroids  Plan - please followup with Dr Constance Holster to make sure you do not need additioal intevention  - continue nasal spray as before  SCHATZKI'S RING Dysphagia, unspecified type  -Seems thi is improved after stretch by Dr Joseph Fujita  Plan  - per [facility]  Chronic cough    - due to all of above; glad better/nearly resolved    Follow-up - Aug 2024 after CT chest      SIGNATURE    Dr. Brand Males, M.D., F.C.C.P,  Pulmonary and Critical Care Medicine Staff Physician, [facility] Director -  Interstitial Lung Disease  Program  Pulmonary [place] [facility] [place], [place], [address]  Pager: [phone], If no answer or between  15:00h - 7:00h: call [phone] Telephone: [phone]  10:38  AM 06/26/2022

## 2022-06-27 LAB — PULMONARY FUNCTION TEST
DL/VA % pred: 66 %
DL/VA: 2.76 ml/min/mmHg/L
DLCO cor % pred: 62 %
DLCO cor: 16.55 ml/min/mmHg
DLCO unc % pred: 63 %
DLCO unc: 16.96 ml/min/mmHg
FEF 25-75 Post: 2.68 L/sec
FEF 25-75 Pre: 3.03 L/sec
FEF2575-%Change-Post: -11 %
FEF2575-%Pred-Post: 100 %
FEF2575-%Pred-Pre: 113 %
FEV1-%Change-Post: 1 %
FEV1-%Pred-Post: 95 %
FEV1-%Pred-Pre: 94 %
FEV1-Post: 3.25 L
FEV1-Pre: 3.22 L
FEV1FVC-%Change-Post: 0 %
FEV1FVC-%Pred-Pre: 105 %
FEV6-%Change-Post: 2 %
FEV6-%Pred-Post: 94 %
FEV6-%Pred-Pre: 91 %
FEV6-Post: 4.08 L
FEV6-Pre: 3.98 L
FEV6FVC-%Change-Post: 0 %
FEV6FVC-%Pred-Post: 104 %
FEV6FVC-%Pred-Pre: 105 %
FVC-%Change-Post: 0 %
FVC-%Pred-Post: 89 %
FVC-%Pred-Pre: 89 %
FVC-Post: 4.1 L
Post FEV1/FVC ratio: 79 %
Post FEV6/FVC ratio: 99 %
Pre FEV1/FVC ratio: 79 %
Pre FEV6/FVC Ratio: 100 %
RV % pred: 87 %
RV: 2.07 L
TLC % pred: 88 %
TLC: 6.23 L

## 2022-07-31 NOTE — Telephone Encounter (Signed)
Called pt and discussed and made his follow up appointment with Dr C and informed pt that we will give him his 90 day refill at that time. Pt states that he does have enough medication to last until this scheduled appt.

## 2022-08-05 ENCOUNTER — Encounter: Payer: Self-pay | Admitting: Cardiovascular Disease

## 2022-08-05 ENCOUNTER — Ambulatory Visit
Payer: No Typology Code available for payment source | Attending: Cardiovascular Disease | Admitting: Cardiovascular Disease

## 2022-08-05 VITALS — BP 128/74 | HR 74 | Ht 71.0 in | Wt 195.6 lb

## 2022-08-05 DIAGNOSIS — I700 Atherosclerosis of aorta: Secondary | ICD-10-CM | POA: Diagnosis not present

## 2022-08-05 DIAGNOSIS — T451X5D Adverse effect of antineoplastic and immunosuppressive drugs, subsequent encounter: Secondary | ICD-10-CM

## 2022-08-05 DIAGNOSIS — E785 Hyperlipidemia, unspecified: Secondary | ICD-10-CM | POA: Diagnosis not present

## 2022-08-05 DIAGNOSIS — I2510 Atherosclerotic heart disease of native coronary artery without angina pectoris: Secondary | ICD-10-CM | POA: Diagnosis not present

## 2022-08-05 DIAGNOSIS — E119 Type 2 diabetes mellitus without complications: Secondary | ICD-10-CM | POA: Diagnosis not present

## 2022-08-05 DIAGNOSIS — T451X5A Adverse effect of antineoplastic and immunosuppressive drugs, initial encounter: Secondary | ICD-10-CM

## 2022-08-05 DIAGNOSIS — I493 Ventricular premature depolarization: Secondary | ICD-10-CM

## 2022-08-05 DIAGNOSIS — I427 Cardiomyopathy due to drug and external agent: Secondary | ICD-10-CM

## 2022-08-05 DIAGNOSIS — I5189 Other ill-defined heart diseases: Secondary | ICD-10-CM

## 2022-08-05 NOTE — Patient Instructions (Addendum)
Medication Instructions:  Stop taking Cavedilol (Coreg) *If you need a refill on your cardiac medications before your next appointment, please call your pharmacy*   Lab Work: Getting labs with PCP- Please send lab results to Dr Sallyanne Kuster If you have labs (blood work) drawn today and your tests are completely normal, you will receive your results only by: [place] (if you have MyChart) OR A paper copy in the mail If you have any lab test that is abnormal or we need to change your treatment, we will call you to review the results.   Testing/Procedures: Dr Sallyanne Kuster has ordered a CT coronary calcium score.   Test locations:  [place]   This is $99 out of pocket.   Coronary CalciumScan A coronary calcium scan is an imaging test used to look for deposits of calcium and other fatty materials (plaques) in the inner lining of the blood vessels of the heart (coronary arteries). These deposits of calcium and plaques can partly clog and narrow the coronary arteries without producing any symptoms or warning signs. This puts a person at risk for a heart attack. This test can detect these deposits before symptoms develop. Tell a health care provider about: Any allergies you have. All medicines you are taking, including vitamins, herbs, eye drops, creams, and over-the-counter medicines. Any problems you or family members have had with anesthetic medicines. Any blood disorders you have. Any surgeries you have had. Any medical conditions you have. Whether you are pregnant or may be pregnant. What are the risks? Generally, this is a safe procedure. However, problems may occur, including: Harm to a pregnant woman and her unborn baby. This test involves the use of radiation. Radiation exposure can be dangerous to a pregnant woman and her unborn baby. If you are pregnant, you generally should not have this procedure done. Slight increase in the risk of cancer.  This is because of the radiation involved in the test. What happens before the procedure? No preparation is needed for this procedure. What happens during the procedure? You will undress and remove any jewelry around your neck or chest. You will put on a hospital gown. Sticky electrodes will be placed on your chest. The electrodes will be connected to an electrocardiogram (ECG) machine to record a tracing of the electrical activity of your heart. A CT scanner will take pictures of your heart. During this time, you will be asked to lie still and hold your breath for 2-3 seconds while a picture of your heart is being taken. The procedure may vary among health care providers and hospitals. What happens after the procedure? You can get dressed. You can return to your normal activities. It is up to you to get the results of your test. Ask your health care provider, or the department that is doing the test, when your results will be ready. Summary A coronary calcium scan is an imaging test used to look for deposits of calcium and other fatty materials (plaques) in the inner lining of the blood vessels of the heart (coronary arteries). Generally, this is a safe procedure. Tell your health care provider if you are pregnant or may be pregnant. No preparation is needed for this procedure. A CT scanner will take pictures of your heart. You can return to your normal activities after the scan is done. This information is not intended to replace advice given to you by your health care provider. Make sure you discuss any questions you have with your  health care provider. Document Released: 10/18/2007 Document Revised: 03/10/2016 Document Reviewed: 03/10/2016 Elsevier Interactive Patient Education  2017 [place]: At [facility], you and your health needs are our priority.  As part of our continuing mission to provide you with exceptional heart care, we have created designated  Provider Care Teams.  These Care Teams include your primary Cardiologist (physician) and Advanced Practice Providers (APPs -  Physician Assistants and Nurse Practitioners) who all work together to provide you with the care you need, when you need it.  We recommend signing up for the patient portal called "MyChart".  Sign up information is provided on this After Visit Summary.  MyChart is used to connect with patients for Virtual Visits (Telemedicine).  Patients are able to view lab/test results, encounter notes, upcoming appointments, etc.  Non-urgent messages can be sent to your provider as well.   To learn more about what you can do with MyChart, go to [email or web address].    Your next appointment:   1 year(s)  Provider:   Sanda Klein, MD

## 2022-08-05 NOTE — Progress Notes (Signed)
Cardiology Office Note:    Date:  08/05/2022   ID:  Joseph Powell, DOB Aug 19, 1955, MRN [id number]  PCP:  Faustino Congress, NP   [facility] Providers Cardiologist:  Sanda Klein, MD     Referring MD: Faustino Congress, NP   Chief Complaint  Patient presents with   coronary calcification on CT    History of Present Illness:    Joseph Powell is a 67 y.o. male with a hx of T-cell lymphoma s/p R-CHOEP 2018 and autologous stem cell transplant 2019, hypogammaglobulinemia, OSA, asthma, type 2 diabetes mellitus, history of acute respiratory failure for parainfluenza and haemophilus influenza pneumonia with ARDS June 2023, bronchiectasis, history of esophageal dilation for Schatzki's ring, left total knee replacement 2023.  At one point was found to possibly have chemotherapy related cardiomyopathy, small pericardial effusion by PET 2018, mildly abnormal GLS (-12% on echo Jan 2019), but all this has resolved.  Initially evaluated by cardiology in 2018 for a small/moderate severity defect in the inferoseptal left ventricle consistent with attenuation artifact.  Echo in 2021 showed LVEF 50-55% with mild LVH and grade 1 diastolic dysfunction. Most recent evaluation in August 2023 estimated LVEF to be 55 to 60%, did not describe LVH, showed grade 1 diastolic dysfunction, no significant valvular abnormalities.  During his hospitalization for respiratory failure in June 2023 CT of the chest incidentally showed "mild atheromatous calcifications of the artery of the aorta and coronary arteries".  His most recent lipid profile shows an LDL cholesterol 160 and HDL of 31 and his hemoglobin A1c roughly a year ago was 6.6%, although he has not been officially diagnosed with diabetes mellitus.  In the remote past in 2018 while he was getting chemotherapy his LDL cholesterol was as low as 40.  At that time he may have also been on a statin.  He remembers taking a statin for a brief period of  time that made him feel "awful".  On his medication list I see pravastatin prescribed in 2018 and rosuvastatin prescribed in November 2022 (he never took the latter).  The patient specifically denies any chest pain at rest or with exertion, dyspnea at rest or with exertion, orthopnea, paroxysmal nocturnal dyspnea, syncope, palpitations, focal neurological deficits, intermittent claudication, lower extremity edema, unexplained weight gain, cough, hemoptysis or wheezing.   He has been walking around the neighborhood with his wife, covering 2 miles and roughly 40 minutes.  He cannot usually keep up with her because she is a "power walker".  Past Medical History:  Diagnosis Date   Allergic rhinitis    Asthma    slight   Bell's palsy    GERD (gastroesophageal reflux disease)    Gout    Hypercholesteremia    Melanoma    Osteoarthritis    Sleep apnea    Resolving   Thrombocytopenia    Tubular adenoma     Past Surgical History:  Procedure Laterality Date   IR FLUORO GUIDE PORT INSERTION RIGHT  02/17/2017   IR REMOVAL TUN ACCESS W/ PORT W/O FL MOD SED  01/05/2018   IR US GUIDE VASC ACCESS RIGHT  02/17/2017   KNEE SURGERY  1993   MELANOMA EXCISION     NASAL RECONSTRUCTION     VASECTOMY  1999    Current Medications: Current Meds  Medication Sig   benzonatate (TESSALON) 200 MG capsule Take 1 capsule (200 mg total) by mouth 3 (three) times daily as needed for cough.   CALCIUM PO Take by mouth.  Cyanocobalamin (VITAMIN B-12 PO) Take by mouth.   fluticasone-salmeterol (WIXELA INHUB) 250-50 MCG/ACT AEPB Inhale 1 puff into the lungs in the morning and at bedtime.   Vitamin D, Ergocalciferol, (DRISDOL) 1.25 MG (50000 UNIT) CAPS capsule Take 1 capsule (50,000 Units total) by mouth once a week.   [DISCONTINUED] carvedilol (COREG) 3.125 MG tablet Take 1 tablet (3.125 mg total) by mouth 2 (two) times daily with a meal.     Allergies:   Patient has no known allergies.   Social History    Socioeconomic History   Marital status: Married    Spouse name: Not on file   Number of children: Not on file   Years of education: Not on file   Highest education level: Not on file  Occupational History   Occupation: Risk analyst  Tobacco Use   Smoking status: Never    Passive exposure: Past   Smokeless tobacco: Never  Vaping Use   Vaping Use: Never used  Substance and Sexual Activity   Alcohol use: Yes    Alcohol/week: 5.0 standard drinks of alcohol    Types: 5 Glasses of wine per week   Drug use: No   Sexual activity: Yes    Partners: Female  Other Topics Concern   Not on file  Social History Narrative   Not on file   Social Determinants of Health   Financial Resource Strain: Not on file  Food Insecurity: Not on file  Transportation Needs: Not on file  Physical Activity: Not on file  Stress: Not on file  Social Connections: Not on file     Family History: The patient's family history includes Asthma in his mother; Colon cancer in his paternal uncle; Diabetes in his mother; Gout in his brother. There is no history of Esophageal cancer or Stomach cancer.  ROS:   Please see the history of present illness.     All other systems reviewed and are negative.  EKGs/Labs/Other Studies Reviewed:    The following studies were reviewed today: Echocardiogram 12/18/2021:  Left ventricular ejection fraction, by estimation, is 55 to 60%. The left ventricle has normal function. The left ventricle has no regional wall motion abnormalities. Left ventricular diastolic parameters are consistent with Grade I diastolic dysfunction (impaired relaxation). Right ventricular systolic function is normal. The right ventricular size is normal. Tricuspid regurgitation signal is inadequate for assessing PA pressure. The mitral valve is normal in structure. Trivial mitral valve regurgitation. No evidence of mitral stenosis. The aortic valve is tricuspid. Aortic valve regurgitation is not  visualized. No aortic stenosis is present. IVC not visualized   LV e' medial:    8.05 cm/s  LV E/e' medial:  9.2  LV e' lateral:   10.10 cm/s  LV E/e' lateral: 7.4   MV Decel Time: 249 msec     MV E velocity: 74.40 cm/s  MV A velocity: 91.20 cm/s  MV E/A ratio:  0.82   EKG:  EKG is  ordered today.  The ekg ordered today demonstrates sinus rhythm with 2 PVCs that both have a morphology suggestive of RVOT PVCs.  There is poor anterior R wave progression which has been present on multiple ECGs and has a pattern suggestive of left anterior fascicular block except the degree of left axis deviation does not meet this diagnosis.  No change from previous tracings.  Recent Labs: 10/17/2021: B Natriuretic Peptide 372.0 10/19/2021: Magnesium 2.2 10/31/2021: Pro B Natriuretic peptide (BNP) 120.0 12/27/2021: ALT 14; BUN 12; Creatinine 0.68; Potassium 4.4;  Sodium 141 02/21/2022: Hemoglobin 15.7; Platelets 98  Recent Lipid Panel    Component Value Date/Time   CHOL 221 (H) 03/05/2021 0835   TRIG 149.0 03/05/2021 0835   HDL 31.10 (L) 03/05/2021 0835   CHOLHDL 7 03/05/2021 0835   VLDL 29.8 03/05/2021 0835   LDLCALC 160 (H) 03/05/2021 0835     Risk Assessment/Calculations:                Physical Exam:    VS:  BP 128/74   Pulse 74   Ht 5\' 11"  (1.803 m)   Wt 195 lb 9.6 oz (88.7 kg)   SpO2 96%   BMI 27.28 kg/m     Wt Readings from Last 3 Encounters:  08/05/22 195 lb 9.6 oz (88.7 kg)  06/26/22 187 lb 9.6 oz (85.1 kg)  05/07/22 197 lb 6.4 oz (89.5 kg)     GEN: Appears well, at most minimally overweight well nourished, well developed in no acute distress HEENT: Normal NECK: No JVD; No carotid bruits LYMPHATICS: No lymphadenopathy CARDIAC: RRR, no murmurs, rubs, gallops RESPIRATORY: Few faint crackles in the left lung base, otherwise clear to auscultation without rales, wheezing or rhonchi  ABDOMEN: Soft, non-tender, non-distended MUSCULOSKELETAL:  No edema; No deformity  SKIN:  Warm and dry NEUROLOGIC:  Alert and oriented x 3 PSYCHIATRIC:  Normal affect   ASSESSMENT:    1. Coronary artery calcification seen on CAT scan   2. Atherosclerosis of aorta   3. Dyslipidemia (high LDL; low HDL)   4. Type 2 diabetes mellitus without complication, without long-term current use of insulin   5. Diastolic dysfunction   6. Chemotherapy induced cardiomyopathy   7. PVCs (premature ventricular contractions)    PLAN:    In order of problems listed above:  Coronary calcification: Hard to quantify, there is a lot of motion artifact.  Cannot really say if this is more/average/less than the average patient is age.  Will order a coronary calcium score. Aortic atherosclerosis: Very scanty on my review.  Normal caliber aorta. Dyslipidemia: He has moderate elevation in LDL cholesterol and a low HDL cholesterol, placing him at risk for vascular complications.  She is reluctant to try statins due to previous adverse effects.  We talked about the possibility that he will tolerate a different agent without the same side effects and about the other agents that are available if she does indeed require aggressive lipid-lowering and cannot tolerate statins. Possible diabetes mellitus: Hemoglobin A1c from June 2023 puts him in this category.  He has a follow-up visit next week with his new primary care physician, Dr. Moreen Fowler,  and will have labs repeated. Diastolic dysfunction: Not sure that this is an accurate diagnosis.  On my review of the echocardiogram she does not have left ventricular hypertrophy or left atrial enlargement.  Even though the E: A ratio is less than 1.0, the e' velocities of his mitral annulus diastolic motion are quite normal for age.  He has never had clinical congestive heart failure.  BNP during his recent hospitalization for acute respiratory illness was marginally above the upper limit of normal.  NYHA functional class I and not requiring diuretics.  I think he can stop taking  the carvedilol permanently. History of chemotherapy related cardiomyopathy: This was transient and resolved spontaneously suggesting it was due to Rituxan rather than Adriamycin.  No sign of residual systolic left ventricular dysfunction.   PVCs: These are completely asymptomatic.  No specific treatment necessary for this  Medication Adjustments/Labs and Tests Ordered: Current medicines are reviewed at length with the patient today.  Concerns regarding medicines are outlined above.  Orders Placed This Encounter  Procedures   CT CARDIAC SCORING (SELF PAY ONLY)   EKG 12-Lead   No orders of the defined types were placed in this encounter.   Patient Instructions  Medication Instructions:  Stop taking Cavedilol (Coreg) *If you need a refill on your cardiac medications before your next appointment, please call your pharmacy*   Lab Work: Getting labs with PCP- Please send lab results to Dr Sallyanne Kuster If you have labs (blood work) drawn today and your tests are completely normal, you will receive your results only by: [place] (if you have MyChart) OR A paper copy in the mail If you have any lab test that is abnormal or we need to change your treatment, we will call you to review the results.   Testing/Procedures: Dr Sallyanne Kuster has ordered a CT coronary calcium score.   Test locations:  [place]   This is $99 out of pocket.   Coronary CalciumScan A coronary calcium scan is an imaging test used to look for deposits of calcium and other fatty materials (plaques) in the inner lining of the blood vessels of the heart (coronary arteries). These deposits of calcium and plaques can partly clog and narrow the coronary arteries without producing any symptoms or warning signs. This puts a person at risk for a heart attack. This test can detect these deposits before symptoms develop. Tell a health care provider about: Any allergies you  have. All medicines you are taking, including vitamins, herbs, eye drops, creams, and over-the-counter medicines. Any problems you or family members have had with anesthetic medicines. Any blood disorders you have. Any surgeries you have had. Any medical conditions you have. Whether you are pregnant or may be pregnant. What are the risks? Generally, this is a safe procedure. However, problems may occur, including: Harm to a pregnant woman and her unborn baby. This test involves the use of radiation. Radiation exposure can be dangerous to a pregnant woman and her unborn baby. If you are pregnant, you generally should not have this procedure done. Slight increase in the risk of cancer. This is because of the radiation involved in the test. What happens before the procedure? No preparation is needed for this procedure. What happens during the procedure? You will undress and remove any jewelry around your neck or chest. You will put on a hospital gown. Sticky electrodes will be placed on your chest. The electrodes will be connected to an electrocardiogram (ECG) machine to record a tracing of the electrical activity of your heart. A CT scanner will take pictures of your heart. During this time, you will be asked to lie still and hold your breath for 2-3 seconds while a picture of your heart is being taken. The procedure may vary among health care providers and hospitals. What happens after the procedure? You can get dressed. You can return to your normal activities. It is up to you to get the results of your test. Ask your health care provider, or the department that is doing the test, when your results will be ready. Summary A coronary calcium scan is an imaging test used to look for deposits of calcium and other fatty materials (plaques) in the inner lining of the blood vessels of the heart (coronary arteries). Generally, this is a safe procedure. Tell your health care provider if you are  pregnant or may be pregnant. No preparation is needed for this procedure. A CT scanner will take pictures of your heart. You can return to your normal activities after the scan is done. This information is not intended to replace advice given to you by your health care provider. Make sure you discuss any questions you have with your health care provider. Document Released: 10/18/2007 Document Revised: 03/10/2016 Document Reviewed: 03/10/2016 Elsevier Interactive Patient Education  2017 [place]: At [facility], you and your health needs are our priority.  As part of our continuing mission to provide you with exceptional heart care, we have created designated Provider Care Teams.  These Care Teams include your primary Cardiologist (physician) and Advanced Practice Providers (APPs -  Physician Assistants and Nurse Practitioners) who all work together to provide you with the care you need, when you need it.  We recommend signing up for the patient portal called "MyChart".  Sign up information is provided on this After Visit Summary.  MyChart is used to connect with patients for Virtual Visits (Telemedicine).  Patients are able to view lab/test results, encounter notes, upcoming appointments, etc.  Non-urgent messages can be sent to your provider as well.   To learn more about what you can do with MyChart, go to [email or web address].    Your next appointment:   1 year(s)  Provider:   Sanda Klein, MD       Signed, Sanda Klein, MD  08/05/2022 9:18 AM    [place]

## 2022-08-05 NOTE — Progress Notes (Signed)
Did not write on AVS that patient needs to stop taking the Carvedilol (Coreg)  Called patient a few minutes after he left the office. He verbalized understanding of discontinuing carvedilol. Added to AVS after patient left

## 2022-08-07 ENCOUNTER — Ambulatory Visit (HOSPITAL_BASED_OUTPATIENT_CLINIC_OR_DEPARTMENT_OTHER)
Admission: RE | Admit: 2022-08-07 | Discharge: 2022-08-07 | Disposition: A | Discharge status: Home / Self Care | Payer: No Typology Code available for payment source | Source: Ambulatory Visit | Attending: Cardiovascular Disease | Admitting: Cardiovascular Disease

## 2022-08-07 ENCOUNTER — Other Ambulatory Visit: Payer: Self-pay | Admitting: Emergency Medicine

## 2022-08-07 DIAGNOSIS — E785 Hyperlipidemia, unspecified: Secondary | ICD-10-CM | POA: Insufficient documentation

## 2022-08-07 DIAGNOSIS — I2510 Atherosclerotic heart disease of native coronary artery without angina pectoris: Secondary | ICD-10-CM

## 2022-08-07 MED ORDER — ATORVASTATIN CALCIUM 40 MG PO TABS: 40.0000 mg | ORAL_TABLET | Freq: Every day | ORAL | 3 refills | Status: DC

## 2022-09-01 DIAGNOSIS — J329 Chronic sinusitis, unspecified: Secondary | ICD-10-CM | POA: Insufficient documentation

## 2022-09-01 NOTE — Assessment & Plan Note (Addendum)
-   Patient has had a dry cough for several months. No associated shortness of breath. Tessalon and Delsym not helping. Changing Dul[employer] to [employer]. Keep apt with Dr. Debroah Baller

## 2022-09-01 NOTE — Assessment & Plan Note (Addendum)
-   CT sinuses in Dec 2023 with mild mucosal thickening R>L with obstruction right osteomeatal complex.  Partial opacification of right mastoid air cells. Allergy panel negative.   Recommendations: Start mucinex 600mg  twice daily Start nasal spray morning and evening Check sputum sample  Rx: Dymista nasal spray 2 sprays per nostril twice daily Augmentin 1 tablet twice daily x 10 days  Refer back to ENT

## 2022-09-15 ENCOUNTER — Other Ambulatory Visit: Payer: Self-pay | Admitting: Hematology

## 2022-09-15 DIAGNOSIS — C8442 Peripheral T-cell lymphoma, not classified, intrathoracic lymph nodes: Secondary | ICD-10-CM

## 2022-09-25 ENCOUNTER — Other Ambulatory Visit: Payer: Self-pay

## 2022-09-25 DIAGNOSIS — C8442 Peripheral T-cell lymphoma, not classified, intrathoracic lymph nodes: Secondary | ICD-10-CM

## 2022-09-25 MED ORDER — VITAMIN D (ERGOCALCIFEROL) 1.25 MG (50000 UNIT) PO CAPS
50000.0000 [IU] | ORAL_CAPSULE | ORAL | 0 refills | Status: DC
Start: 2022-09-25 — End: 2023-03-02

## 2022-10-16 ENCOUNTER — Encounter: Payer: Self-pay | Admitting: *Deleted

## 2022-10-27 ENCOUNTER — Encounter: Payer: Self-pay | Admitting: Cardiovascular Disease

## 2022-10-27 NOTE — Telephone Encounter (Signed)
Please stop the atorvastatin. He may be able to tolerate a different statin - will try rosuvastatin 10 mg daily instead. If he has the same side effects, can try injectable alternative such as Repatha or Praluent.

## 2022-10-28 MED ORDER — ROSUVASTATIN CALCIUM 10 MG PO TABS: 10.0000 mg | ORAL_TABLET | Freq: Every day | ORAL | 3 refills | Status: DC

## 2022-11-11 ENCOUNTER — Telehealth: Payer: Self-pay | Admitting: Emergency Medicine

## 2022-11-11 NOTE — Telephone Encounter (Signed)
Left voicemail stating that we need him to come in for fasting lipid panel.

## 2022-11-12 ENCOUNTER — Other Ambulatory Visit: Payer: Self-pay

## 2022-11-12 DIAGNOSIS — E785 Hyperlipidemia, unspecified: Secondary | ICD-10-CM

## 2022-11-12 DIAGNOSIS — I2510 Atherosclerotic heart disease of native coronary artery without angina pectoris: Secondary | ICD-10-CM

## 2022-11-13 ENCOUNTER — Telehealth: Payer: Self-pay | Admitting: Emergency Medicine

## 2022-11-13 DIAGNOSIS — E785 Hyperlipidemia, unspecified: Secondary | ICD-10-CM

## 2022-11-13 LAB — LIPID PANEL
Chol/HDL Ratio: 5.7 ratio — ABNORMAL HIGH (ref 0.0–5.0)
Cholesterol, Total: 171 mg/dL (ref 100–199)
HDL: 30 mg/dL — ABNORMAL LOW (ref >39)
LDL Chol Calc (NIH): 117 mg/dL — ABNORMAL HIGH (ref 0–99)
Triglycerides: 130 mg/dL (ref 0–149)
VLDL Cholesterol Cal: 24 mg/dL (ref 5–40)

## 2022-11-13 MED ORDER — REPATHA 140 MG/ML ~~LOC~~ SOSY: 140.0000 mg | PREFILLED_SYRINGE | SUBCUTANEOUS | 7 refills | Status: DC

## 2022-11-13 NOTE — Telephone Encounter (Signed)
-----   Message from [facility] sent at 11/13/2022  3:14 PM EDT ----- As before, the good cholesterol is rather low, although not as bad as it was 6 years ago. Unfortunately the LDL cholesterol is not at target of <70. (is he currently taking the rosuvastatin 10 mg daily?). If he is tolerating the rosuvastatin, I recommend increasing it to 20 mg daily as well as adding Zetia 10 mg daily, repeating the labs in 2-3 months. If he is not tolerating the rosuvastatin, I think we should start treatment with either Repatha or Praluent. Calcium score is >2000.

## 2022-11-13 NOTE — Telephone Encounter (Signed)
Pt reports that he is still having cramps in his legs- especially at night. [place] they are not quite as bad as they were before, but is afraid that they will get worse with increasing the dose. He would rather start treatment with Repatha or Praluent. He said he is comfortable giving himself shots, he has done this in the past. Informed him that we will start the process for getting on one of these medications, and will be in touch soon with updates. He verbalized understanding.

## 2022-11-14 ENCOUNTER — Other Ambulatory Visit (HOSPITAL_COMMUNITY): Payer: Self-pay

## 2022-11-14 ENCOUNTER — Telehealth: Payer: Self-pay

## 2022-11-14 NOTE — Telephone Encounter (Signed)
Left message on voicemail:  Repatha has been sent to your pharmacy, follow directions closely for administrations (you said you were comfortable with self-injections yesterday) Insurance covers this medications, 30 day supply at a time. You will need to come in for fasting lab work/lipid panel in 2-3 months to see how well this medication is working. Please call with any questions/concerns. Left call back number

## 2022-11-14 NOTE — Addendum Note (Signed)
Addended by: Scheryl Marten on: 11/14/2022 12:51 PM   Modules accepted: Orders

## 2022-11-14 NOTE — Telephone Encounter (Signed)
Order placed for Lipid panel-future 2-3 months

## 2022-11-14 NOTE — Telephone Encounter (Signed)
    Pharmacy Patient Advocate Encounter   Received notification from RN that prior authorization for REPATHA is required/requested.      Per test claim:  The current 30 day co-pay is, $0.00 and the 90 day copay is 0.00 as well.  No PA needed at this time.

## 2022-12-25 ENCOUNTER — Other Ambulatory Visit: Payer: Self-pay

## 2022-12-25 DIAGNOSIS — C8442 Peripheral T-cell lymphoma, not classified, intrathoracic lymph nodes: Secondary | ICD-10-CM

## 2022-12-26 ENCOUNTER — Inpatient Hospital Stay (HOSPITAL_BASED_OUTPATIENT_CLINIC_OR_DEPARTMENT_OTHER): Payer: No Typology Code available for payment source | Admitting: Hematology

## 2022-12-26 ENCOUNTER — Inpatient Hospital Stay: Payer: No Typology Code available for payment source | Attending: Hematology

## 2022-12-26 VITALS — BP 136/88 | HR 69 | Temp 98.3°F | Resp 17 | Ht 71.0 in | Wt 185.4 lb

## 2022-12-26 DIAGNOSIS — D696 Thrombocytopenia, unspecified: Secondary | ICD-10-CM | POA: Insufficient documentation

## 2022-12-26 DIAGNOSIS — Z8572 Personal history of non-Hodgkin lymphomas: Secondary | ICD-10-CM | POA: Diagnosis present

## 2022-12-26 DIAGNOSIS — C8442 Peripheral T-cell lymphoma, not classified, intrathoracic lymph nodes: Secondary | ICD-10-CM | POA: Diagnosis not present

## 2022-12-26 DIAGNOSIS — Z9221 Personal history of antineoplastic chemotherapy: Secondary | ICD-10-CM | POA: Diagnosis not present

## 2022-12-26 LAB — CMP (CANCER CENTER ONLY)
ALT: 10 U/L (ref 0–44)
AST: 25 U/L (ref 15–41)
Albumin: 4.5 g/dL (ref 3.5–5.0)
Alkaline Phosphatase: 90 U/L (ref 38–126)
Anion gap: 6 (ref 5–15)
BUN: 6 mg/dL — ABNORMAL LOW (ref 8–23)
CO2: 30 mmol/L (ref 22–32)
Calcium: 9.4 mg/dL (ref 8.9–10.3)
Chloride: 105 mmol/L (ref 98–111)
Creatinine: 0.81 mg/dL (ref 0.61–1.24)
GFR, Estimated: >60 mL/min (ref >60)
Glucose, Bld: 112 mg/dL — ABNORMAL HIGH (ref 70–99)
Potassium: 4.2 mmol/L (ref 3.5–5.1)
Sodium: 141 mmol/L (ref 135–145)
Total Bilirubin: 0.8 mg/dL (ref 0.3–1.2)
Total Protein: 6.7 g/dL (ref 6.5–8.1)

## 2022-12-26 LAB — CBC WITH DIFFERENTIAL (CANCER CENTER ONLY)
Abs Immature Granulocytes: 0.15 10*3/uL — ABNORMAL HIGH (ref 0.00–0.07)
Basophils Absolute: 0 10*3/uL (ref 0.0–0.1)
Basophils Relative: 0 %
Eosinophils Absolute: 0 10*3/uL (ref 0.0–0.5)
Eosinophils Relative: 0 %
HCT: 44.7 % (ref 39.0–52.0)
Hemoglobin: 15.5 g/dL (ref 13.0–17.0)
Immature Granulocytes: 2 %
Lymphocytes Relative: 17 %
Lymphs Abs: 1.5 10*3/uL (ref 0.7–4.0)
MCH: 32.2 pg (ref 26.0–34.0)
MCHC: 34.7 g/dL (ref 30.0–36.0)
MCV: 92.7 fL (ref 80.0–100.0)
Monocytes Absolute: 2.2 10*3/uL — ABNORMAL HIGH (ref 0.1–1.0)
Monocytes Relative: 25 %
Neutro Abs: 5 10*3/uL (ref 1.7–7.7)
Neutrophils Relative %: 56 %
Platelet Count: 123 10*3/uL — ABNORMAL LOW (ref 150–400)
RBC: 4.82 MIL/uL (ref 4.22–5.81)
RDW: 13.3 % (ref 11.5–15.5)
WBC Count: 8.9 10*3/uL (ref 4.0–10.5)
nRBC: 0 % (ref 0.0–0.2)

## 2022-12-26 LAB — LACTATE DEHYDROGENASE: LDH: 141 U/L (ref 98–192)

## 2022-12-26 NOTE — Progress Notes (Signed)
HEMATOLOGY/ONCOLOGY CLINIC NOTE  Date of Service: 12/26/22   Patient Care Team: Pcp, No as PCP - General Croitoru, Mihai, MD as PCP - Cardiology (Cardiology)  CHIEF COMPLAINTS/PURPOSE OF CONSULTATION:   Follow-up for active surveillance of high-grade T-cell non-Hodgkin's lymphoma  CURRENT THERAPY: post BMT active surveillance  Previous Treatment; R-CHOEP x 6 cycles BEAM -Auto HSCT  HISTORY OF PRESENTING ILLNESS:  See previous note for details  INTERVAL HISTORY: Joseph Powell is a 67 y.o. male here for follow-up of his T-cell non-Hodgkin's lymphoma. Patient was last seen by me on 12/27/2021 and reported having severe multifocal pneumonia leading to hospitalization and requiring antibiotic treatment IV. He was started on IVIG by his transplant team at [facility] due to his hypogammaglobinemia. Patient was doing well overall during the time of the clinical visit with no other new medical concerns.   Today, he reports that he is feeling well overall with no significant new medical concerns. However, he reports that he recently was infected with Cyclospora with vegetable intake causing GI issues. He was given bactrim to manage and has no diarrhea at this time. He notes that he spoke to the health department regarding this issue.   Patient reports normal energy levels and stable weight. He denies any fever, chills, new lumps/bumps, change in breathing habits, or chest pain.  He reports chronic sinusitis which has been managed by his pulmonologist and ENT physician. He denies any known triggers and denies allergic sinusitis.  Patient reports that he previously received four igG infusions and his last infusion was early this year. He notes that the first three infusions were monthly. He will be tested next month to evaluate his igG levels. Prior to receiving igG infusions, his levels were less than 400.   He is planning on travelling to [place] and [place] next Spring.   MEDICAL  HISTORY:  Past Medical History:  Diagnosis Date   Allergic rhinitis    Asthma    slight   Bell's palsy    GERD (gastroesophageal reflux disease)    Gout    Hypercholesteremia    Melanoma (HCC)    Osteoarthritis    Sleep apnea    Resolving   Thrombocytopenia (HCC)    Tubular adenoma     SURGICAL HISTORY: Past Surgical History:  Procedure Laterality Date   IR FLUORO GUIDE PORT INSERTION RIGHT  02/17/2017   IR REMOVAL TUN ACCESS W/ PORT W/O FL MOD SED  01/05/2018   IR US GUIDE VASC ACCESS RIGHT  02/17/2017   KNEE SURGERY  1993   MELANOMA EXCISION     NASAL RECONSTRUCTION     VASECTOMY  1999    SOCIAL HISTORY: Social History   Socioeconomic History   Marital status: Married    Spouse name: Not on file   Number of children: Not on file   Years of education: Not on file   Highest education level: Not on file  Occupational History   Occupation: Primary school teacher  Tobacco Use   Smoking status: Never    Passive exposure: Past   Smokeless tobacco: Never  Vaping Use   Vaping status: Never Used  Substance and Sexual Activity   Alcohol use: Yes    Alcohol/week: 5.0 standard drinks of alcohol    Types: 5 Glasses of wine per week   Drug use: No   Sexual activity: Yes    Partners: Female  Other Topics Concern   Not on file  Social History Narrative   Not  on file   Social Determinants of Health   Financial Resource Strain: Not on file  Food Insecurity: Not on file  Transportation Needs: Not on file  Physical Activity: Not on file  Stress: Not on file  Social Connections: Not on file  Intimate Partner Violence: Not on file    FAMILY HISTORY: Family History  Problem Relation Age of Onset   Diabetes Mother    Asthma Mother    Gout Brother    Colon cancer Paternal Uncle    Esophageal cancer Neg Hx    Stomach cancer Neg Hx     ALLERGIES:  has No Known Allergies.  MEDICATIONS:  Current Outpatient Medications  Medication Sig Dispense Refill   benzonatate  (TESSALON) 200 MG capsule Take 1 capsule (200 mg total) by mouth 3 (three) times daily as needed for cough. 270 capsule 3   CALCIUM PO Take by mouth.     Cyanocobalamin (VITAMIN B-12 PO) Take by mouth.     Evolocumab (REPATHA) 140 MG/ML SOSY Inject 140 mg into the skin every 14 (fourteen) days. 3 mL 7   fluticasone-salmeterol (WIXELA INHUB) 250-50 MCG/ACT AEPB Inhale 1 puff into the lungs in the morning and at bedtime. 3 each 3   Vitamin D, Ergocalciferol, (DRISDOL) 1.25 MG (50000 UNIT) CAPS capsule Take 1 capsule (50,000 Units total) by mouth once a week. 12 capsule 0   No current facility-administered medications for this visit.    REVIEW OF SYSTEMS:    10 Point review of Systems was done is negative except as noted above.   PHYSICAL EXAMINATION: ECOG PERFORMANCE STATUS: 1 - Symptomatic but completely ambulatory  There were no vitals filed for this visit.  There were no vitals filed for this visit.  .There is no height or weight on file to calculate BMI.   GENERAL:alert, in no acute distress and comfortable SKIN: no acute rashes, no significant lesions EYES: conjunctiva are pink and non-injected, sclera anicteric OROPHARYNX: MMM, no exudates, no oropharyngeal erythema or ulceration NECK: supple, no JVD LYMPH:  no palpable lymphadenopathy in the cervical, axillary or inguinal regions LUNGS: clear to auscultation b/l with normal respiratory effort HEART: regular rate & rhythm ABDOMEN:  normoactive bowel sounds , non tender, not distended. Extremity: no pedal edema PSYCH: alert & oriented x 3 with fluent speech NEURO: no focal motor/sensory deficits   LABORATORY DATA:  I have reviewed the data as listed  .    Latest Ref Rng & Units 02/21/2022    5:11 PM 12/27/2021   12:07 PM 11/14/2021   12:40 PM  CBC  WBC 3.8 - 10.8 Thousand/uL 5.8  6.7  11.5   Hemoglobin 13.2 - 17.1 g/dL 91.4  78.2  95.6   Hematocrit 38.5 - 50.0 % 47.4  40.6  37.5   Platelets 140 - 400 Thousand/uL 98   129  127.0   .CBC    Component Value Date/Time   WBC 5.8 02/21/2022 1711   RBC 5.03 02/21/2022 1711   HGB 15.7 02/21/2022 1711   HGB 14.3 12/27/2021 1207   HGB 8.6 (L) 05/01/2017 0815   HCT 47.4 02/21/2022 1711   HCT 27.6 (L) 05/01/2017 0815   PLT 98 (L) 02/21/2022 1711   PLT 129 (L) 12/27/2021 1207   PLT 30 (L) 05/01/2017 0815   MCV 94.2 02/21/2022 1711   MCV 87.3 05/01/2017 0815   MCH 31.2 02/21/2022 1711   MCHC 33.1 02/21/2022 1711   RDW 12.2 02/21/2022 1711   RDW 18.4 (H) 05/01/2017  0815   LYMPHSABS 1,114 02/21/2022 1711   LYMPHSABS 1.0 05/01/2017 0815   MONOABS 1.7 (H) 12/27/2021 1207   MONOABS 1.8 (H) 05/01/2017 0815   EOSABS 29 02/21/2022 1711   EOSABS 0.0 05/01/2017 0815   BASOSABS 12 02/21/2022 1711   BASOSABS 0.3 (H) 05/01/2017 0815       Latest Ref Rng & Units 12/27/2021   12:07 PM 11/01/2021    7:46 PM 10/31/2021    1:36 PM  CMP  Glucose 70 - 99 mg/dL 233  007  622   BUN 8 - 23 mg/dL 12  17  16    Creatinine 0.61 - 1.24 mg/dL 6.33  3.54  5.62   Sodium 135 - 145 mmol/L 141  133  131   Potassium 3.5 - 5.1 mmol/L 4.4  4.5  3.4   Chloride 98 - 111 mmol/L 108  95  92   CO2 22 - 32 mmol/L 27  25  24    Calcium 8.9 - 10.3 mg/dL 9.4  56.3  9.0   Total Protein 6.5 - 8.1 g/dL 6.5  6.5    Total Bilirubin 0.3 - 1.2 mg/dL 0.9  1.1    Alkaline Phos 38 - 126 U/L 89  75    AST 15 - 41 U/L 28  51    ALT 0 - 44 U/L 14  51     . Lab Results  Component Value Date   LDH 144 12/27/2021       06/22/17 Cytogenetics Report:   06/22/17 Flow Cytometry:    06/22/17 BM Bx:    RADIOGRAPHIC STUDIES:  I have personally reviewed the radiological images as listed and agreed with the findings in the report. No results found.   11/13/17 PET/CT    ASSESSMENT & PLAN:    Joseph Powell is a 66 y.o. male with  #1  History of stage IV high grade Peripheral T cell lymphoma NOS - with aberrant CD20+ currently in complete remission S/p R-CHOEP x 6cycles  BEAM-Auto  HSCT transplant on 08/05/17  #2 Thrombocytopenia. Patient has some chronic thrombocytopenia 85-125k for a few years. Platelets upon initial visit are down to 36k in the setting of newly diagnosed lymphoma and treatment with R-CHOEP and from hypersplenism but then improved. PLt counts had normalized after treatment of the lymphoma pre-transplant Has continued to have some low-grade thrombocytopenia posttransplant.  PLAN:  -Discussed lab results on 12/26/2022 in detail with patient. CBC showed WBC normal 8.9K, hemoglobin of 15.5, and platelets improved to 123K. -bactrim use may have previoulsy caused lower platelet levels -normal neutrophils -patient does have elevated monocytes, which has been elevated since his transplant in April 2019, so this is not a new concern.This may be part of a chronic inflammatory process.  -discussed other potential factors that may play a role in elevated monocytes -LDH normal -CMP normal -No clinical symptoms suggestive of lymphoma recurrence at this time. -No lab evidence of lymphoma recurrence at this time. -educated patient that Cyclospora is generally known to cause chronic diarrhea especially in individuals who are immunosuppressed -informed him that if an individual's immunoglobulin levels are borderline and they endorse frequent infections, there may be a role to evaluate for any deficies of subclasses of antibodies -discussed that the goal igG level is about 500 -advised patient to inform me if there are any concerns regarding his igG levels -advised patient to stay UTD with age-appropriate vaccinations including influenza, RSV, and COVID-19 if he chooses to -continue taking vitamin D supplements  regularly -will continue to monitor with labs in 1 year -continue to follow with the transplant team annually - #3 elevated blood sugar levels/DM2 -Patient notes weight gain and has elevated blood sugar of 137. -Continue to follow-up for fasting glucose and  hemoglobin A1c with primary care physician.  #4 History of Melanoma, 2009 -Follow-up with dermatology.   #8 Patient Active Problem List   Diagnosis Date Noted   Sinusitis 09/01/2022   Atherosclerosis of aorta (HCC) 08/05/2022   Coronary artery calcification seen on CAT scan 08/05/2022   Elevated blood pressure reading 10/31/2021   Multifocal pneumonia    Non-recurrent acute suppurative otitis media of both ears without spontaneous rupture of tympanic membranes    Haemophilus infection    Penicillin allergy    Malnutrition of moderate degree 10/18/2021   Pneumonia of both lungs due to infectious organism 10/17/2021   Hypokalemia 10/17/2021   Acute respiratory failure with hypoxia (HCC) 10/17/2021   Sepsis due to pneumonia (HCC) 10/16/2021   Abdominal pain 07/22/2021   Type 2 diabetes mellitus without complication, without long-term current use of insulin (HCC) 03/05/2021   Mixed hyperlipidemia 03/05/2021   Encounter for general adult medical examination with abnormal findings 03/05/2021   Non-seasonal allergic rhinitis due to pollen 03/05/2021   Cough variant asthma vs UACS 01/27/2017   Mild asthma 08/05/2016   DJD (degenerative joint disease) 08/05/2016   HLD (hyperlipidemia) 07/27/2016   GERD without esophagitis 07/27/2016   Gout 07/27/2016   OSA (obstructive sleep apnea) 09/19/2013   Thrombocytopenia (HCC) 04/14/2011   SCHATZKI'S RING 08/21/2009  -Continue follow-up with primary to manage other medical issues  FOLLOW-UP: RTC with Dr Candise Che with labs in 12 months  The total time spent in the appointment was *** minutes* .  All of the patient's questions were answered with apparent satisfaction. The patient knows to call the clinic with any problems, questions or concerns.   Wyvonnia Lora MD MS AAHIVMS [facility] Hematology/Oncology Physician [facility]  .*Total Encounter Time as defined by the Centers for Medicare and Medicaid Services includes, in addition  to the face-to-face time of a patient visit (documented in the note above) non-face-to-face time: obtaining and reviewing outside history, ordering and reviewing medications, tests or procedures, care coordination (communications with other health care professionals or caregivers) and documentation in the medical record.    I,Mitra Faeizi,acting as a Neurosurgeon for Wyvonnia Lora, MD.,have documented all relevant documentation on the behalf of Wyvonnia Lora, MD,as directed by  Wyvonnia Lora, MD while in the presence of Wyvonnia Lora, MD.  ***

## 2022-12-27 ENCOUNTER — Other Ambulatory Visit: Payer: Self-pay

## 2022-12-27 ENCOUNTER — Encounter (HOSPITAL_COMMUNITY): Payer: Self-pay | Admitting: Emergency Medicine

## 2022-12-27 ENCOUNTER — Ambulatory Visit (HOSPITAL_COMMUNITY)
Admission: EM | Admit: 2022-12-27 | Discharge: 2022-12-28 | Disposition: A | Discharge status: Home / Self Care | Payer: No Typology Code available for payment source | Attending: Emergency Medicine | Admitting: Emergency Medicine

## 2022-12-27 DIAGNOSIS — K219 Gastro-esophageal reflux disease without esophagitis: Secondary | ICD-10-CM | POA: Diagnosis not present

## 2022-12-27 DIAGNOSIS — T18128A Food in esophagus causing other injury, initial encounter: Secondary | ICD-10-CM | POA: Diagnosis not present

## 2022-12-27 DIAGNOSIS — M1990 Unspecified osteoarthritis, unspecified site: Secondary | ICD-10-CM | POA: Diagnosis not present

## 2022-12-27 DIAGNOSIS — T189XXA Foreign body of alimentary tract, part unspecified, initial encounter: Secondary | ICD-10-CM | POA: Diagnosis present

## 2022-12-27 DIAGNOSIS — Z85820 Personal history of malignant melanoma of skin: Secondary | ICD-10-CM | POA: Diagnosis not present

## 2022-12-27 DIAGNOSIS — G4730 Sleep apnea, unspecified: Secondary | ICD-10-CM | POA: Diagnosis not present

## 2022-12-27 DIAGNOSIS — K222 Esophageal obstruction: Secondary | ICD-10-CM | POA: Diagnosis not present

## 2022-12-27 DIAGNOSIS — E119 Type 2 diabetes mellitus without complications: Secondary | ICD-10-CM | POA: Insufficient documentation

## 2022-12-27 DIAGNOSIS — W44F3XA Food entering into or through a natural orifice, initial encounter: Secondary | ICD-10-CM | POA: Diagnosis not present

## 2022-12-27 DIAGNOSIS — I2510 Atherosclerotic heart disease of native coronary artery without angina pectoris: Secondary | ICD-10-CM | POA: Diagnosis not present

## 2022-12-27 DIAGNOSIS — K2970 Gastritis, unspecified, without bleeding: Secondary | ICD-10-CM | POA: Diagnosis not present

## 2022-12-27 DIAGNOSIS — J45909 Unspecified asthma, uncomplicated: Secondary | ICD-10-CM | POA: Diagnosis not present

## 2022-12-27 DIAGNOSIS — Z7951 Long term (current) use of inhaled steroids: Secondary | ICD-10-CM | POA: Insufficient documentation

## 2022-12-27 NOTE — ED Triage Notes (Signed)
Patient coming to ED for evaluation of foreign body stuck in throat.  Reports he has hx of Schatzki's Ring.  Has chocked twice today.  Tonight he was eating chicken and chocked.  Food lodged in throat.  Has attempted water and continues to vomit.  No reports of pain

## 2022-12-28 ENCOUNTER — Encounter (HOSPITAL_COMMUNITY): Payer: Self-pay

## 2022-12-28 ENCOUNTER — Encounter (HOSPITAL_COMMUNITY)
Admission: EM | Disposition: A | Discharge status: Home / Self Care | Payer: Self-pay | Source: Home / Self Care | Attending: Emergency Medicine

## 2022-12-28 ENCOUNTER — Emergency Department (EMERGENCY_DEPARTMENT_HOSPITAL): Payer: No Typology Code available for payment source | Admitting: Anesthesiology

## 2022-12-28 ENCOUNTER — Emergency Department (HOSPITAL_COMMUNITY): Payer: No Typology Code available for payment source | Admitting: Anesthesiology

## 2022-12-28 DIAGNOSIS — J45909 Unspecified asthma, uncomplicated: Secondary | ICD-10-CM

## 2022-12-28 DIAGNOSIS — I2510 Atherosclerotic heart disease of native coronary artery without angina pectoris: Secondary | ICD-10-CM

## 2022-12-28 DIAGNOSIS — T18128A Food in esophagus causing other injury, initial encounter: Secondary | ICD-10-CM

## 2022-12-28 DIAGNOSIS — K222 Esophageal obstruction: Secondary | ICD-10-CM

## 2022-12-28 HISTORY — PX: IMPACTION REMOVAL: SHX5858

## 2022-12-28 HISTORY — PX: ESOPHAGOGASTRODUODENOSCOPY (EGD) WITH PROPOFOL: SHX5813

## 2022-12-28 LAB — CBG MONITORING, ED
Glucose-Capillary: 44 mg/dL — CL (ref 70–99)
Glucose-Capillary: 463 mg/dL — ABNORMAL HIGH (ref 70–99)
Glucose-Capillary: 86 mg/dL (ref 70–99)

## 2022-12-28 LAB — CBC WITH DIFFERENTIAL/PLATELET
Abs Immature Granulocytes: 0.25 10*3/uL — ABNORMAL HIGH (ref 0.00–0.07)
Basophils Absolute: 0 10*3/uL (ref 0.0–0.1)
Basophils Relative: 0 %
Eosinophils Absolute: 0 10*3/uL (ref 0.0–0.5)
Eosinophils Relative: 0 %
HCT: 44.8 % (ref 39.0–52.0)
Hemoglobin: 14.8 g/dL (ref 13.0–17.0)
Immature Granulocytes: 2 %
Lymphocytes Relative: 12 %
Lymphs Abs: 1.3 10*3/uL (ref 0.7–4.0)
MCH: 30.9 pg (ref 26.0–34.0)
MCHC: 33 g/dL (ref 30.0–36.0)
MCV: 93.5 fL (ref 80.0–100.0)
Monocytes Absolute: 2.9 10*3/uL — ABNORMAL HIGH (ref 0.1–1.0)
Monocytes Relative: 28 %
Neutro Abs: 5.9 10*3/uL (ref 1.7–7.7)
Neutrophils Relative %: 58 %
Platelets: 133 10*3/uL — ABNORMAL LOW (ref 150–400)
RBC: 4.79 MIL/uL (ref 4.22–5.81)
RDW: 13.7 % (ref 11.5–15.5)
WBC: 10.3 10*3/uL (ref 4.0–10.5)
nRBC: 0 % (ref 0.0–0.2)

## 2022-12-28 LAB — BASIC METABOLIC PANEL
Anion gap: 9 (ref 5–15)
BUN: 12 mg/dL (ref 8–23)
CO2: 21 mmol/L — ABNORMAL LOW (ref 22–32)
Calcium: 8.8 mg/dL — ABNORMAL LOW (ref 8.9–10.3)
Chloride: 108 mmol/L (ref 98–111)
Creatinine, Ser: 0.8 mg/dL (ref 0.61–1.24)
GFR, Estimated: >60 mL/min (ref >60)
Glucose, Bld: 430 mg/dL — ABNORMAL HIGH (ref 70–99)
Potassium: 3.5 mmol/L (ref 3.5–5.1)
Sodium: 138 mmol/L (ref 135–145)

## 2022-12-28 SURGERY — ESOPHAGOGASTRODUODENOSCOPY (EGD) WITH PROPOFOL
Anesthesia: General

## 2022-12-28 MED ORDER — LIDOCAINE 2% (20 MG/ML) 5 ML SYRINGE
INTRAMUSCULAR | Status: DC | PRN
  Administered 2022-12-28: 100 mg via INTRAVENOUS

## 2022-12-28 MED ORDER — LACTATED RINGERS IV SOLN
INTRAVENOUS | Status: AC | PRN
Start: 2022-12-28 — End: 2022-12-28
  Administered 2022-12-28: 10 mL/h via INTRAVENOUS

## 2022-12-28 MED ORDER — ONDANSETRON HCL 4 MG/2ML IJ SOLN
4.0000 mg | Freq: Once | INTRAMUSCULAR | Status: AC
  Administered 2022-12-28: 4 mg via INTRAVENOUS
  Filled 2022-12-28: qty 2

## 2022-12-28 MED ORDER — FENTANYL CITRATE (PF) 100 MCG/2ML IJ SOLN
INTRAMUSCULAR | Status: AC
  Filled 2022-12-28: qty 2

## 2022-12-28 MED ORDER — DEXTROSE 50 % IV SOLN
1.0000 | Freq: Once | INTRAVENOUS | Status: AC
  Administered 2022-12-28: 50 mL via INTRAVENOUS
  Filled 2022-12-28: qty 50

## 2022-12-28 MED ORDER — FENTANYL CITRATE (PF) 250 MCG/5ML IJ SOLN
INTRAMUSCULAR | Status: DC | PRN
  Administered 2022-12-28: 100 ug via INTRAVENOUS

## 2022-12-28 MED ORDER — PROPOFOL 10 MG/ML IV BOLUS
INTRAVENOUS | Status: DC | PRN
  Administered 2022-12-28: 160 mg via INTRAVENOUS
  Administered 2022-12-28: 40 mg via INTRAVENOUS

## 2022-12-28 MED ORDER — ONDANSETRON HCL 4 MG/2ML IJ SOLN
INTRAMUSCULAR | Status: DC | PRN
  Administered 2022-12-28: 4 mg via INTRAVENOUS

## 2022-12-28 MED ORDER — SUCCINYLCHOLINE CHLORIDE 200 MG/10ML IV SOSY
PREFILLED_SYRINGE | INTRAVENOUS | Status: DC | PRN
Start: 2022-12-28 — End: 2022-12-28
  Administered 2022-12-28: 120 mg via INTRAVENOUS

## 2022-12-28 MED ORDER — SODIUM CHLORIDE 0.9 % IV SOLN: INTRAVENOUS | Status: DC

## 2022-12-28 MED ORDER — GLUCAGON HCL RDNA (DIAGNOSTIC) 1 MG IJ SOLR
1.0000 mg | Freq: Once | INTRAMUSCULAR | Status: AC
  Administered 2022-12-28: 1 mg via INTRAVENOUS
  Filled 2022-12-28: qty 1

## 2022-12-28 SURGICAL SUPPLY — 15 items

## 2022-12-28 NOTE — Anesthesia Postprocedure Evaluation (Signed)
Anesthesia Post Note  Patient: Joseph Powell  Procedure(s) Performed: ESOPHAGOGASTRODUODENOSCOPY (EGD) WITH PROPOFOL IMPACTION REMOVAL     Patient location during evaluation: PACU Anesthesia Type: General Level of consciousness: awake and alert Pain management: pain level controlled Vital Signs Assessment: post-procedure vital signs reviewed and stable Respiratory status: spontaneous breathing, nonlabored ventilation and respiratory function stable Cardiovascular status: blood pressure returned to baseline and stable Postop Assessment: no apparent nausea or vomiting Anesthetic complications: no   No notable events documented.  Last Vitals:  Vitals:   12/28/22 1116 12/28/22 1120  BP: 134/88 (!) 134/90  Pulse: 79 75  Resp: 14 17  Temp: 36.6 C   SpO2: 100% 97%    Last Pain:  Vitals:   12/28/22 1114  TempSrc:   PainSc: 0-No pain                 Lowella Curb

## 2022-12-28 NOTE — Anesthesia Preprocedure Evaluation (Signed)
Anesthesia Evaluation  Patient identified by MRN, date of birth, ID band Patient awake    Reviewed: Allergy & Precautions, H&P , NPO status , Patient's Chart, lab work & pertinent test results  Airway Mallampati: II  TM Distance: >3 FB Neck ROM: Full    Dental no notable dental hx.    Pulmonary asthma , sleep apnea    Pulmonary exam normal breath sounds clear to auscultation       Cardiovascular + CAD  Normal cardiovascular exam Rhythm:Regular Rate:Normal     Neuro/Psych negative neurological ROS  negative psych ROS   GI/Hepatic Neg liver ROS,GERD  ,,  Endo/Other  diabetes    Renal/GU negative Renal ROS  negative genitourinary   Musculoskeletal  (+) Arthritis , Osteoarthritis,    Abdominal   Peds negative pediatric ROS (+)  Hematology negative hematology ROS (+)   Anesthesia Other Findings   Reproductive/Obstetrics negative OB ROS                             Anesthesia Physical Anesthesia Plan  ASA: 3 and emergent  Anesthesia Plan: General   Post-op Pain Management: Minimal or no pain anticipated   Induction: Intravenous, Rapid sequence and Cricoid pressure planned  PONV Risk Score and Plan: 2 and Ondansetron, Midazolam and Treatment may vary due to age or medical condition  Airway Management Planned: Oral ETT  Additional Equipment:   Intra-op Plan:   Post-operative Plan: Extubation in OR  Informed Consent: I have reviewed the patients History and Physical, chart, labs and discussed the procedure including the risks, benefits and alternatives for the proposed anesthesia with the patient or authorized representative who has indicated his/her understanding and acceptance.     Dental advisory given  Plan Discussed with: CRNA  Anesthesia Plan Comments:        Anesthesia Quick Evaluation

## 2022-12-28 NOTE — ED Notes (Signed)
Pt to endoscopy at this time.  

## 2022-12-28 NOTE — Op Note (Signed)
[facility] Patient Name: Joseph Powell Procedure Date: 12/28/2022 MRN: [id number] Attending MD: Shirley Friar , MD, [id number] Date of Birth: 10/28/1955 CSN: [id number] Age: 67 Admit Type: Emergency Department Procedure:                Upper GI endoscopy Indications:              Dysphagia, Foreign body in the esophagus Providers:                Shirley Friar, MD, Eliberto Ivory, RN, Marja Kays, Technician Referring MD:             ER Medicines:                Propofol per Anesthesia, Monitored Anesthesia Care Complications:            No immediate complications. Estimated Blood Loss:     Estimated blood loss was minimal. Procedure:                Pre-Anesthesia Assessment:                           - Prior to the procedure, a History and Physical                            was performed, and patient medications and                            allergies were reviewed. The patient's tolerance of                            previous anesthesia was also reviewed. The risks                            and benefits of the procedure and the sedation                            options and risks were discussed with the patient.                            All questions were answered, and informed consent                            was obtained. Prior Anticoagulants: The patient has                            taken no anticoagulant or antiplatelet agents. ASA                            Grade Assessment: III - A patient with severe                            systemic disease. After reviewing the risks and  benefits, the patient was deemed in satisfactory                            condition to undergo the procedure.                           After obtaining informed consent, the endoscope was                            passed under direct vision. Throughout the                            procedure, the patient's  blood pressure, pulse, and                            oxygen saturations were monitored continuously. The                            [id number]-[id number] ([id number]) Olympus endoscope was introduced                            through the mouth, and advanced to the second part                            of duodenum. The upper GI endoscopy was performed                            with difficulty due to presence of food. Successful                            completion of the procedure was aided by performing                            the maneuvers documented (below) in this report.                            The patient tolerated the procedure well. Scope In: Scope Out: Findings:      Food was found in the distal esophagus. Removal was accomplished with a       rat-toothed forceps, Raptor grasping device and Roth net. Estimated       blood loss was minimal.      One benign-appearing, intrinsic moderate (circumferential scarring or       stenosis; an endoscope may pass) stenosis was found in the distal       esophagus. The stenosis was traversed. The lesion was not amenable to       dilation, and this was not attempted due to mucosal trauma from the food       impaction.      The Z-line was found 42 cm from the incisors.      Segmental mild inflammation characterized by congestion (edema) and       erythema was found in the gastric antrum.      The cardia and gastric fundus were normal on retroflexion.      The examined duodenum was normal. Impression:               -  Food in the distal esophagus. Removal was                            successful.                           - Benign-appearing esophageal stenosis. Lesion not                            amenable to dilation, and not attempted.                           - Z-line, 42 cm from the incisors.                           - Gastritis.                           - Normal examined duodenum. Moderate Sedation:      N/A - MAC  procedure Recommendation:           - Patient has a contact number available for                            emergencies. The signs and symptoms of potential                            delayed complications were discussed with the                            patient. Return to normal activities tomorrow.                            Written discharge instructions were provided to the                            patient.                           - Advance diet as tolerated and clear liquid diet.                           - Return to GI office in 3 weeks. Procedure Code(s):        --- Professional ---                           [phone], Esophagogastroduodenoscopy, flexible,                            transoral; with removal of foreign body(s) Diagnosis Code(s):        --- Professional ---                           T18.108A, Unspecified foreign body in esophagus                            causing other  injury, initial encounter                           K22.2, Esophageal obstruction                           M57.846N, Food in esophagus causing other injury,                            initial encounter                           R13.10, Dysphagia, unspecified                           K29.70, Gastritis, unspecified, without bleeding CPT copyright 2022 American Medical Association. All rights reserved. The codes documented in this report are preliminary and upon coder review may  be revised to meet current compliance requirements. Shirley Friar, MD 12/28/2022 11:07:10 AM This report has been signed electronically. Number of Addenda: 0

## 2022-12-28 NOTE — Transfer of Care (Signed)
Immediate Anesthesia Transfer of Care Note  Patient: WILLMER HILTON  Procedure(s) Performed: ESOPHAGOGASTRODUODENOSCOPY (EGD) WITH PROPOFOL IMPACTION REMOVAL  Patient Location: PACU  Anesthesia Type:General  Level of Consciousness: awake and patient cooperative  Airway & Oxygen Therapy: Patient Spontanous Breathing and Patient connected to face mask oxygen  Post-op Assessment: Report given to RN and Post -op Vital signs reviewed and stable  Post vital signs: Reviewed and stable  Last Vitals:  Vitals Value Taken Time  BP    Temp    Pulse 80 12/28/22 1112  Resp 17 12/28/22 1112  SpO2 100 % 12/28/22 1112  Vitals shown include unfiled device data.  Last Pain:  Vitals:   12/28/22 1002  TempSrc: Temporal  PainSc: 0-No pain         Complications: No notable events documented.

## 2022-12-28 NOTE — ED Provider Notes (Signed)
[place] EMERGENCY DEPARTMENT AT [facility] Provider Note   CSN: [id number] Arrival date & time: 12/27/22  2318     History  Chief Complaint  Patient presents with   Swallowed Foreign Body    MAYKEL GELL is a 67 y.o. male.  HPI     This is a 67 year old male who presents with concern for food impaction.  Patient was eating chicken and rice earlier tonight when he feels like part of it got stuck.  He has a history of a Schatzki's ring and prior food impaction with similar symptoms.  He states he has not been able to keep anything down.  He is spitting up his saliva.  Denies chest pain or abdominal pain.  Home Medications Prior to Admission medications   Medication Sig Start Date End Date Taking? Authorizing Provider  benzonatate (TESSALON) 200 MG capsule Take 1 capsule (200 mg total) by mouth 3 (three) times daily as needed for cough. 05/14/22  Yes Kalman Shan, MD  CALCIUM PO Take 1 tablet by mouth daily. Pt takes when remember to   Yes [provider]  Cyanocobalamin (VITAMIN B-12 PO) Take by mouth.   Yes [provider]  fluticasone-salmeterol (WIXELA INHUB) 250-50 MCG/ACT AEPB Inhale 1 puff into the lungs in the morning and at bedtime. 05/14/22  Yes Kalman Shan, MD  Evolocumab (REPATHA) 140 MG/ML SOSY Inject 140 mg into the skin every 14 (fourteen) days. Patient taking differently: Inject 140 mg into the skin every 14 (fourteen) days. Every other Monday 11/13/22   Croitoru, Mihai, MD  rosuvastatin (CRESTOR) 10 MG tablet Take 10 mg by mouth daily. Patient not taking: Reported on 12/28/2022    [provider]  Vitamin D, Ergocalciferol, (DRISDOL) 1.25 MG (50000 UNIT) CAPS capsule Take 1 capsule (50,000 Units total) by mouth once a week. 09/25/22   Johney Maine, MD      Allergies    Patient has no known allergies.    Review of Systems   Review of Systems  Constitutional:  Negative for fever.  Gastrointestinal:   Positive for nausea and vomiting. Negative for abdominal pain.  All other systems reviewed and are negative.   Physical Exam Updated Vital Signs BP (!) 139/101   Pulse 86   Temp 97.9 F (36.6 C) (Oral)   Resp 17   Ht 1.778 m (5\' 10" )   Wt 81.2 kg   SpO2 97%   BMI 25.68 kg/m  Physical Exam Vitals and nursing note reviewed.  Constitutional:      Appearance: He is well-developed. He is not ill-appearing.     Comments: ABCs intact, occasionally spitting out bag  HENT:     Head: Normocephalic and atraumatic.  Eyes:     Pupils: Pupils are equal, round, and reactive to light.  Cardiovascular:     Rate and Rhythm: Normal rate and regular rhythm.     Heart sounds: Normal heart sounds. No murmur heard. Pulmonary:     Effort: Pulmonary effort is normal. No respiratory distress.     Breath sounds: Normal breath sounds. No wheezing.  Abdominal:     Palpations: Abdomen is soft.     Tenderness: There is no abdominal tenderness. There is no rebound.  Musculoskeletal:     Cervical back: Neck supple.  Lymphadenopathy:     Cervical: No cervical adenopathy.  Skin:    General: Skin is warm and dry.  Neurological:     Mental Status: He is alert and oriented to  person, place, and time.  Psychiatric:        Mood and Affect: Mood normal.     ED Results / Procedures / Treatments   Labs (all labs ordered are listed, but only abnormal results are displayed) Labs Reviewed  CBG MONITORING, ED - Abnormal; Notable for the following components:      Result Value   Glucose-Capillary 44 (*)    All other components within normal limits  CBC WITH DIFFERENTIAL/PLATELET  BASIC METABOLIC PANEL    EKG None  Radiology No results found.  Procedures Procedures    Medications Ordered in ED Medications  glucagon (human recombinant) (GLUCAGEN) injection 1 mg (1 mg Intravenous Given 12/28/22 0111)  ondansetron (ZOFRAN) injection 4 mg (4 mg Intravenous Given 12/28/22 0112)  dextrose 50 %  solution 50 mL (50 mLs Intravenous Given 12/28/22 0142)    ED Course/ Medical Decision Making/ A&P Clinical Course as of 12/28/22 0244  Sun Dec 28, 2022  0158 Spoke to Dr. Bosie Clos.  Request BMP for anesthesia purposes.  Of note, nursing obtained CBG at bedside.  Patient was asymptomatic; however, CBG 44.  He is NPO.  Was given 1 amp of D50.  CBC and BMP added to workup. [CH]    Clinical Course User Index [CH] Priyah Schmuck, Mayer Masker, MD                                 Medical Decision Making Amount and/or Complexity of Data Reviewed Labs: ordered.  Risk Prescription drug management.   This patient presents to the ED for concern of food impaction, this involves an extensive number of treatment options, and is a complaint that carries with it a high risk of complications and morbidity.  I considered the following differential and admission for this acute, potentially life threatening condition.  The differential diagnosis includes food impaction, esophagitis, esophageal tear  MDM:    This is a 67 year old male who presents with concern for food impaction.  He is overall nontoxic and ABCs are intact.  Vital signs are reassuring.  History and physical exam most consistent with likely food impaction.  Patient was given glucagon and Zofran.  Gastroenterology was consulted.  Nursing recorded CBG of 44.  Patient is asymptomatic.  No history of diabetes.  He is NPO.  Will give 1 amp of D50 although suspect that this may be a tolerated range for the patient.  Gastroenterology requested lab work including BMP.  This was ordered.  (Labs, imaging, consults)  Labs: I Ordered, and personally interpreted labs.  The pertinent results include: CBC, BMP, CBC  Imaging Studies ordered: I ordered imaging studies including none I independently visualized and interpreted imaging. I agree with the radiologist interpretation  Additional history obtained from chart review.  External records from outside source  obtained and reviewed including prior endoscopies  Cardiac Monitoring: The patient was maintained on a cardiac monitor.  If on the cardiac monitor, I personally viewed and interpreted the cardiac monitored which showed an underlying rhythm of: Sinus rhythm  Reevaluation: After the interventions noted above, I reevaluated the patient and found that they have :stayed the same  Social Determinants of Health:  lives independently  Disposition: Endoscopy  Co morbidities that complicate the patient evaluation  Past Medical History:  Diagnosis Date   Allergic rhinitis    Asthma    slight   Bell's palsy    GERD (gastroesophageal reflux disease)  Gout    Hypercholesteremia    Melanoma (HCC)    Osteoarthritis    Sleep apnea    Resolving   Thrombocytopenia (HCC)    Tubular adenoma      Medicines Meds ordered this encounter  Medications   glucagon (human recombinant) (GLUCAGEN) injection 1 mg   ondansetron (ZOFRAN) injection 4 mg   dextrose 50 % solution 50 mL    I have reviewed the patients home medicines and have made adjustments as needed  Problem List / ED Course: Problem List Items Addressed This Visit   None               Final Clinical Impression(s) / ED Diagnoses Final diagnoses:  None    Rx / DC Orders ED Discharge Orders     None         Euell Schiff, Mayer Masker, MD 12/28/22 0246

## 2022-12-28 NOTE — Consult Note (Signed)
Referring Provider: Dr. Wilkie Aye Primary Care Physician:  Tally Joe, MD Primary Gastroenterologist:  Dr. Dulce Sellar  Reason for Consultation:  Food Impaction  HPI: Joseph Powell is a 67 y.o. male presents with a food impaction after eating chicken for dinner last night and after the first bite of chicken he felt like the food hung up.  He has not been able to swallow and saliva or liquids since that time.  He does have a history of a distal esophageal ring that was dilated to 18 mm with a TTS balloon by Dr. Dulce Sellar in November 2023.  He reports doing well following that dilation until sometime this year when he started having recurrent dysphagia to meats and it almost always happens after 1-2 bites.  Denies any trouble swallowing other foods and denies any trouble swallowing liquids.  Reports of food impaction years ago when he first was diagnosed with an esophageal ring but denies any other food impactions requiring endoscopy prior to today.  History of GERD with occasional heartburn.  Colonoscopy in November 2023 where 2 adenomas were removed and showed internal hemorrhoids.   Past Medical History:  Diagnosis Date   Allergic rhinitis    Asthma    slight   Bell's palsy    GERD (gastroesophageal reflux disease)    Gout    Hypercholesteremia    Melanoma (HCC)    Osteoarthritis    Sleep apnea    Resolving   Thrombocytopenia (HCC)    Tubular adenoma     Past Surgical History:  Procedure Laterality Date   IR FLUORO GUIDE PORT INSERTION RIGHT  02/17/2017   IR REMOVAL TUN ACCESS W/ PORT W/O FL MOD SED  01/05/2018   IR US GUIDE VASC ACCESS RIGHT  02/17/2017   KNEE SURGERY  1993   MELANOMA EXCISION     NASAL RECONSTRUCTION     VASECTOMY  1999    Prior to Admission medications   Medication Sig Start Date End Date Taking? Authorizing Provider  benzonatate (TESSALON) 200 MG capsule Take 1 capsule (200 mg total) by mouth 3 (three) times daily as needed for cough. 05/14/22  Yes Kalman Shan, MD  CALCIUM PO Take 1 tablet by mouth daily. Pt takes when remember to   Yes [provider]  Cyanocobalamin (VITAMIN B-12 PO) Take by mouth.   Yes [provider]  fluticasone-salmeterol (WIXELA INHUB) 250-50 MCG/ACT AEPB Inhale 1 puff into the lungs in the morning and at bedtime. 05/14/22  Yes Kalman Shan, MD  Evolocumab (REPATHA) 140 MG/ML SOSY Inject 140 mg into the skin every 14 (fourteen) days. Patient taking differently: Inject 140 mg into the skin every 14 (fourteen) days. Every other Monday 11/13/22   Croitoru, Mihai, MD  rosuvastatin (CRESTOR) 10 MG tablet Take 10 mg by mouth daily. Patient not taking: Reported on 12/28/2022    [provider]  Vitamin D, Ergocalciferol, (DRISDOL) 1.25 MG (50000 UNIT) CAPS capsule Take 1 capsule (50,000 Units total) by mouth once a week. 09/25/22   Johney Maine, MD    Scheduled Meds: Continuous Infusions:  sodium chloride     lactated ringers 10 mL/hr (12/28/22 1016)   PRN Meds:.lactated ringers  Allergies as of 12/27/2022   (No Known Allergies)    Family History  Problem Relation Age of Onset   Diabetes Mother    Asthma Mother    Gout Brother    Colon cancer Paternal Uncle    Esophageal cancer Neg Hx    Stomach cancer  Neg Hx     Social History   Socioeconomic History   Marital status: Married    Spouse name: Not on file   Number of children: Not on file   Years of education: Not on file   Highest education level: Not on file  Occupational History   Occupation: Primary school teacher  Tobacco Use   Smoking status: Never    Passive exposure: Past   Smokeless tobacco: Never  Vaping Use   Vaping status: Never Used  Substance and Sexual Activity   Alcohol use: Yes    Alcohol/week: 5.0 standard drinks of alcohol    Types: 5 Glasses of wine per week   Drug use: No   Sexual activity: Yes    Partners: Female  Other Topics Concern   Not on file  Social History Narrative   Not on file    Social Determinants of Health   Financial Resource Strain: Not on file  Food Insecurity: Not on file  Transportation Needs: Not on file  Physical Activity: Not on file  Stress: Not on file  Social Connections: Not on file  Intimate Partner Violence: Not on file    Review of Systems: All negative except as stated above in HPI.  Physical Exam: Vital signs: Vitals:   12/28/22 0846 12/28/22 1002  BP: (!) 164/93 (!) 172/94  Pulse: 64 76  Resp: 18 15  Temp: 97.9 F (36.6 C) 97.7 F (36.5 C)  SpO2: 96% 99%     General:   Lethargic, elderly, thin, no acute distress, pleasant  Head: normocephalic, atraumatic Eyes: anicteric sclera ENT: oropharynx clear Neck: supple, nontender Lungs:  Clear throughout to auscultation.   No wheezes, crackles, or rhonchi. No acute distress. Heart:  Regular rate and rhythm; no murmurs, clicks, rubs,  or gallops. Abdomen: soft, nontender, nondistended, +BS  Rectal:  Deferred Ext: no edema  GI:  Lab Results: Recent Labs    12/26/22 1237 12/28/22 0255  WBC 8.9 10.3  HGB 15.5 14.8  HCT 44.7 44.8  PLT 123* 133*   BMET Recent Labs    12/26/22 1237 12/28/22 0255  NA 141 138  K 4.2 3.5  CL 105 108  CO2 30 21*  GLUCOSE 112* 430*  BUN 6* 12  CREATININE 0.81 0.80  CALCIUM 9.4 8.8*   LFT Recent Labs    12/26/22 1237  PROT 6.7  ALBUMIN 4.5  AST 25  ALT 10  ALKPHOS 90  BILITOT 0.8   PT/INR No results for input(s): "LABPROT", "INR" in the last 72 hours.   Studies/Results: No results found.  Impression/Plan: 66 year old man with recurrent solid food dysphagia presenting with a food impaction shortly after starting to eat chicken last evening and since that time has been unable to swallow saliva or liquids.  EGD to remove food bolus and will need a repeat EGD in the near future for dilation since he likely has mucosal trauma from the food impaction.  Risk and benefits of upper endoscopy were discussed with patient and he agrees  to proceed.  LOS: 0 days   Shirley Friar  12/28/2022, 10:16 AM  Questions please call [phone]

## 2022-12-28 NOTE — Interval H&P Note (Signed)
History and Physical Interval Note:  12/28/2022 10:27 AM  Joseph Powell  has presented today for surgery, with the diagnosis of food impaction.  The various methods of treatment have been discussed with the patient and family. After consideration of risks, benefits and other options for treatment, the patient has consented to  Procedure(s): ESOPHAGOGASTRODUODENOSCOPY (EGD) WITH PROPOFOL (N/A) as a surgical intervention.  The patient's history has been reviewed, patient examined, no change in status, stable for surgery.  I have reviewed the patient's chart and labs.  Questions were answered to the patient's satisfaction.     Shirley Friar

## 2022-12-28 NOTE — H&P (View-Only) (Signed)
Referring Provider: Dr. Wilkie Aye Primary Care Physician:  Tally Joe, MD Primary Gastroenterologist:  Dr. Dulce Sellar  Reason for Consultation:  Food Impaction  HPI: Joseph Powell is a 67 y.o. male presents with a food impaction after eating chicken for dinner last night and after the first bite of chicken he felt like the food hung up.  He has not been able to swallow and saliva or liquids since that time.  He does have a history of a distal esophageal ring that was dilated to 18 mm with a TTS balloon by Dr. Dulce Sellar in November 2023.  He reports doing well following that dilation until sometime this year when he started having recurrent dysphagia to meats and it almost always happens after 1-2 bites.  Denies any trouble swallowing other foods and denies any trouble swallowing liquids.  Reports of food impaction years ago when he first was diagnosed with an esophageal ring but denies any other food impactions requiring endoscopy prior to today.  History of GERD with occasional heartburn.  Colonoscopy in November 2023 where 2 adenomas were removed and showed internal hemorrhoids.   Past Medical History:  Diagnosis Date   Allergic rhinitis    Asthma    slight   Bell's palsy    GERD (gastroesophageal reflux disease)    Gout    Hypercholesteremia    Melanoma (HCC)    Osteoarthritis    Sleep apnea    Resolving   Thrombocytopenia (HCC)    Tubular adenoma     Past Surgical History:  Procedure Laterality Date   IR FLUORO GUIDE PORT INSERTION RIGHT  02/17/2017   IR REMOVAL TUN ACCESS W/ PORT W/O FL MOD SED  01/05/2018   IR US GUIDE VASC ACCESS RIGHT  02/17/2017   KNEE SURGERY  1993   MELANOMA EXCISION     NASAL RECONSTRUCTION     VASECTOMY  1999    Prior to Admission medications   Medication Sig Start Date End Date Taking? Authorizing Provider  benzonatate (TESSALON) 200 MG capsule Take 1 capsule (200 mg total) by mouth 3 (three) times daily as needed for cough. 05/14/22  Yes Kalman Shan, MD  CALCIUM PO Take 1 tablet by mouth daily. Pt takes when remember to   Yes [provider]  Cyanocobalamin (VITAMIN B-12 PO) Take by mouth.   Yes [provider]  fluticasone-salmeterol (WIXELA INHUB) 250-50 MCG/ACT AEPB Inhale 1 puff into the lungs in the morning and at bedtime. 05/14/22  Yes Kalman Shan, MD  Evolocumab (REPATHA) 140 MG/ML SOSY Inject 140 mg into the skin every 14 (fourteen) days. Patient taking differently: Inject 140 mg into the skin every 14 (fourteen) days. Every other Monday 11/13/22   Croitoru, Mihai, MD  rosuvastatin (CRESTOR) 10 MG tablet Take 10 mg by mouth daily. Patient not taking: Reported on 12/28/2022    [provider]  Vitamin D, Ergocalciferol, (DRISDOL) 1.25 MG (50000 UNIT) CAPS capsule Take 1 capsule (50,000 Units total) by mouth once a week. 09/25/22   Johney Maine, MD    Scheduled Meds: Continuous Infusions:  sodium chloride     lactated ringers 10 mL/hr (12/28/22 1016)   PRN Meds:.lactated ringers  Allergies as of 12/27/2022   (No Known Allergies)    Family History  Problem Relation Age of Onset   Diabetes Mother    Asthma Mother    Gout Brother    Colon cancer Paternal Uncle    Esophageal cancer Neg Hx    Stomach cancer  Neg Hx     Social History   Socioeconomic History   Marital status: Married    Spouse name: Not on file   Number of children: Not on file   Years of education: Not on file   Highest education level: Not on file  Occupational History   Occupation: Primary school teacher  Tobacco Use   Smoking status: Never    Passive exposure: Past   Smokeless tobacco: Never  Vaping Use   Vaping status: Never Used  Substance and Sexual Activity   Alcohol use: Yes    Alcohol/week: 5.0 standard drinks of alcohol    Types: 5 Glasses of wine per week   Drug use: No   Sexual activity: Yes    Partners: Female  Other Topics Concern   Not on file  Social History Narrative   Not on file    Social Determinants of Health   Financial Resource Strain: Not on file  Food Insecurity: Not on file  Transportation Needs: Not on file  Physical Activity: Not on file  Stress: Not on file  Social Connections: Not on file  Intimate Partner Violence: Not on file    Review of Systems: All negative except as stated above in HPI.  Physical Exam: Vital signs: Vitals:   12/28/22 0846 12/28/22 1002  BP: (!) 164/93 (!) 172/94  Pulse: 64 76  Resp: 18 15  Temp: 97.9 F (36.6 C) 97.7 F (36.5 C)  SpO2: 96% 99%     General:   Lethargic, elderly, thin, no acute distress, pleasant  Head: normocephalic, atraumatic Eyes: anicteric sclera ENT: oropharynx clear Neck: supple, nontender Lungs:  Clear throughout to auscultation.   No wheezes, crackles, or rhonchi. No acute distress. Heart:  Regular rate and rhythm; no murmurs, clicks, rubs,  or gallops. Abdomen: soft, nontender, nondistended, +BS  Rectal:  Deferred Ext: no edema  GI:  Lab Results: Recent Labs    12/26/22 1237 12/28/22 0255  WBC 8.9 10.3  HGB 15.5 14.8  HCT 44.7 44.8  PLT 123* 133*   BMET Recent Labs    12/26/22 1237 12/28/22 0255  NA 141 138  K 4.2 3.5  CL 105 108  CO2 30 21*  GLUCOSE 112* 430*  BUN 6* 12  CREATININE 0.81 0.80  CALCIUM 9.4 8.8*   LFT Recent Labs    12/26/22 1237  PROT 6.7  ALBUMIN 4.5  AST 25  ALT 10  ALKPHOS 90  BILITOT 0.8   PT/INR No results for input(s): "LABPROT", "INR" in the last 72 hours.   Studies/Results: No results found.  Impression/Plan: 66 year old man with recurrent solid food dysphagia presenting with a food impaction shortly after starting to eat chicken last evening and since that time has been unable to swallow saliva or liquids.  EGD to remove food bolus and will need a repeat EGD in the near future for dilation since he likely has mucosal trauma from the food impaction.  Risk and benefits of upper endoscopy were discussed with patient and he agrees  to proceed.  LOS: 0 days   Shirley Friar  12/28/2022, 10:16 AM  Questions please call [phone]

## 2022-12-28 NOTE — Anesthesia Procedure Notes (Signed)
Procedure Name: Intubation Date/Time: 12/28/2022 10:38 AM  Performed by: Adria Dill, CRNAPre-anesthesia Checklist: Patient identified, Emergency Drugs available, Suction available and Patient being monitored Patient Re-evaluated:Patient Re-evaluated prior to induction Oxygen Delivery Method: Circle system utilized Preoxygenation: Pre-oxygenation with 100% oxygen Induction Type: IV induction, Rapid sequence and Cricoid Pressure applied Laryngoscope Size: Miller and 3 Grade View: Grade II Tube type: Oral Tube size: 7.5 mm Number of attempts: 1 Airway Equipment and Method: Stylet Placement Confirmation: ETT inserted through vocal cords under direct vision, positive ETCO2 and breath sounds checked- equal and bilateral Secured at: 21 cm Tube secured with: Tape Dental Injury: Teeth and Oropharynx as per pre-operative assessment

## 2023-01-29 ENCOUNTER — Emergency Department (HOSPITAL_BASED_OUTPATIENT_CLINIC_OR_DEPARTMENT_OTHER): Payer: No Typology Code available for payment source | Admitting: Anesthesiology

## 2023-01-29 ENCOUNTER — Emergency Department (HOSPITAL_COMMUNITY): Payer: No Typology Code available for payment source | Admitting: Anesthesiology

## 2023-01-29 ENCOUNTER — Encounter (HOSPITAL_COMMUNITY): Payer: Self-pay | Admitting: Emergency Medicine

## 2023-01-29 ENCOUNTER — Other Ambulatory Visit: Payer: Self-pay

## 2023-01-29 ENCOUNTER — Encounter (HOSPITAL_COMMUNITY)
Admission: EM | Disposition: A | Discharge status: Home / Self Care | Payer: Self-pay | Source: Home / Self Care | Attending: Emergency Medicine

## 2023-01-29 ENCOUNTER — Ambulatory Visit (HOSPITAL_COMMUNITY)
Admission: EM | Admit: 2023-01-29 | Discharge: 2023-01-29 | Disposition: A | Discharge status: Home / Self Care | Payer: No Typology Code available for payment source | Attending: Gastroenterology | Admitting: Gastroenterology

## 2023-01-29 DIAGNOSIS — K219 Gastro-esophageal reflux disease without esophagitis: Secondary | ICD-10-CM | POA: Diagnosis not present

## 2023-01-29 DIAGNOSIS — K3189 Other diseases of stomach and duodenum: Secondary | ICD-10-CM | POA: Insufficient documentation

## 2023-01-29 DIAGNOSIS — M1990 Unspecified osteoarthritis, unspecified site: Secondary | ICD-10-CM | POA: Diagnosis not present

## 2023-01-29 DIAGNOSIS — T18128A Food in esophagus causing other injury, initial encounter: Secondary | ICD-10-CM | POA: Diagnosis present

## 2023-01-29 DIAGNOSIS — R1310 Dysphagia, unspecified: Secondary | ICD-10-CM | POA: Insufficient documentation

## 2023-01-29 DIAGNOSIS — J45909 Unspecified asthma, uncomplicated: Secondary | ICD-10-CM | POA: Diagnosis not present

## 2023-01-29 DIAGNOSIS — K2900 Acute gastritis without bleeding: Secondary | ICD-10-CM | POA: Diagnosis not present

## 2023-01-29 DIAGNOSIS — K2970 Gastritis, unspecified, without bleeding: Secondary | ICD-10-CM

## 2023-01-29 DIAGNOSIS — Z7951 Long term (current) use of inhaled steroids: Secondary | ICD-10-CM | POA: Insufficient documentation

## 2023-01-29 DIAGNOSIS — Z85820 Personal history of malignant melanoma of skin: Secondary | ICD-10-CM | POA: Insufficient documentation

## 2023-01-29 HISTORY — PX: FOREIGN BODY REMOVAL: SHX962

## 2023-01-29 HISTORY — PX: ESOPHAGOGASTRODUODENOSCOPY: SHX5428

## 2023-01-29 LAB — BASIC METABOLIC PANEL
Anion gap: 10 (ref 5–15)
BUN: 9 mg/dL (ref 8–23)
CO2: 27 mmol/L (ref 22–32)
Calcium: 9.8 mg/dL (ref 8.9–10.3)
Chloride: 102 mmol/L (ref 98–111)
Creatinine, Ser: 0.74 mg/dL (ref 0.61–1.24)
GFR, Estimated: >60 mL/min (ref >60)
Glucose, Bld: 124 mg/dL — ABNORMAL HIGH (ref 70–99)
Potassium: 4.3 mmol/L (ref 3.5–5.1)
Sodium: 139 mmol/L (ref 135–145)

## 2023-01-29 LAB — CBC WITH DIFFERENTIAL/PLATELET
Abs Immature Granulocytes: 0.16 10*3/uL — ABNORMAL HIGH (ref 0.00–0.07)
Basophils Absolute: 0 10*3/uL (ref 0.0–0.1)
Basophils Relative: 0 %
Eosinophils Absolute: 0.1 10*3/uL (ref 0.0–0.5)
Eosinophils Relative: 1 %
HCT: 52.3 % — ABNORMAL HIGH (ref 39.0–52.0)
Hemoglobin: 17.3 g/dL — ABNORMAL HIGH (ref 13.0–17.0)
Immature Granulocytes: 1 %
Lymphocytes Relative: 11 %
Lymphs Abs: 1.4 10*3/uL (ref 0.7–4.0)
MCH: 31.3 pg (ref 26.0–34.0)
MCHC: 33.1 g/dL (ref 30.0–36.0)
MCV: 94.6 fL (ref 80.0–100.0)
Monocytes Absolute: 2.7 10*3/uL — ABNORMAL HIGH (ref 0.1–1.0)
Monocytes Relative: 22 %
Neutro Abs: 7.8 10*3/uL — ABNORMAL HIGH (ref 1.7–7.7)
Neutrophils Relative %: 65 %
Platelets: 129 10*3/uL — ABNORMAL LOW (ref 150–400)
RBC: 5.53 MIL/uL (ref 4.22–5.81)
RDW: 13.9 % (ref 11.5–15.5)
WBC: 12.1 10*3/uL — ABNORMAL HIGH (ref 4.0–10.5)
nRBC: 0 % (ref 0.0–0.2)

## 2023-01-29 SURGERY — EGD (ESOPHAGOGASTRODUODENOSCOPY)
Anesthesia: General

## 2023-01-29 MED ORDER — PROPOFOL 10 MG/ML IV BOLUS
INTRAVENOUS | Status: AC
  Filled 2023-01-29: qty 20

## 2023-01-29 MED ORDER — ONDANSETRON HCL 4 MG/2ML IJ SOLN
INTRAMUSCULAR | Status: DC | PRN
  Administered 2023-01-29: 4 mg via INTRAVENOUS

## 2023-01-29 MED ORDER — LIDOCAINE HCL (CARDIAC) PF 100 MG/5ML IV SOSY
PREFILLED_SYRINGE | INTRAVENOUS | Status: DC | PRN
  Administered 2023-01-29: 80 mg via INTRAVENOUS

## 2023-01-29 MED ORDER — LACTATED RINGERS IV SOLN
INTRAVENOUS | Status: AC | PRN
  Administered 2023-01-29: 1000 mL via INTRAVENOUS

## 2023-01-29 MED ORDER — SODIUM CHLORIDE 0.9 % IV SOLN: INTRAVENOUS | Status: DC

## 2023-01-29 MED ORDER — SUCCINYLCHOLINE CHLORIDE 200 MG/10ML IV SOSY
PREFILLED_SYRINGE | INTRAVENOUS | Status: DC | PRN
Start: 2023-01-29 — End: 2023-01-29
  Administered 2023-01-29: 180 mg via INTRAVENOUS

## 2023-01-29 MED ORDER — ROCURONIUM BROMIDE 100 MG/10ML IV SOLN
INTRAVENOUS | Status: DC | PRN
  Administered 2023-01-29: 10 mg via INTRAVENOUS

## 2023-01-29 MED ORDER — FENTANYL CITRATE (PF) 100 MCG/2ML IJ SOLN
INTRAMUSCULAR | Status: DC | PRN
  Administered 2023-01-29 (×2): 50 ug via INTRAVENOUS

## 2023-01-29 MED ORDER — LACTATED RINGERS IV SOLN: INTRAVENOUS | Status: DC | PRN

## 2023-01-29 MED ORDER — PROPOFOL 10 MG/ML IV BOLUS
INTRAVENOUS | Status: DC | PRN
  Administered 2023-01-29: 150 mg via INTRAVENOUS

## 2023-01-29 MED ORDER — SUGAMMADEX SODIUM 200 MG/2ML IV SOLN
INTRAVENOUS | Status: DC | PRN
  Administered 2023-01-29: 160 mg via INTRAVENOUS

## 2023-01-29 MED ORDER — GLUCAGON HCL RDNA (DIAGNOSTIC) 1 MG IJ SOLR
1.0000 mg | Freq: Once | INTRAMUSCULAR | Status: AC
  Administered 2023-01-29: 1 mg via INTRAVENOUS
  Filled 2023-01-29: qty 1

## 2023-01-29 MED ORDER — FENTANYL CITRATE (PF) 100 MCG/2ML IJ SOLN
INTRAMUSCULAR | Status: AC
  Filled 2023-01-29: qty 2

## 2023-01-29 NOTE — Transfer of Care (Signed)
Immediate Anesthesia Transfer of Care Note  Patient: Joseph Powell  Procedure(s) Performed: ESOPHAGOGASTRODUODENOSCOPY (EGD) FOREIGN BODY REMOVAL  Patient Location: PACU  Anesthesia Type:general  Level of Consciousness: awake and alert   Airway & Oxygen Therapy: Patient Spontanous Breathing  Post-op Assessment: Report given to RN and Post -op Vital signs reviewed and stable  Post vital signs: Reviewed and stable  Last Vitals:  Vitals Value Taken Time  BP    Temp    Pulse 88 01/29/23 1623  Resp 17 01/29/23 1623  SpO2 93 % 01/29/23 1623  Vitals shown include unfiled device data.  Last Pain:  Vitals:   01/29/23 1514  TempSrc: Temporal  PainSc: 0-No pain         Complications: No notable events documented.

## 2023-01-29 NOTE — Consult Note (Signed)
Referring Provider: ER Primary Care Physician:  Tally Joe, MD Primary Gastroenterologist:  Dr. Dulce Sellar  Reason for Consultation:  Food Impaction  HPI: Joseph Powell is a 67 y.o. male with history of dysphagia and food impaction in August presents with sensation of pork hanging up after 2 bites last night and unable to swallow saliva or liquids since then. Chest pressure not as bad now compared with last night following food hanging up. S/P EGD with balloon dilation (TTS 18 mm) of distal esophageal stricture on 01/20/23 (Dr. Dulce Sellar) and was eating without difficulty until last night. S/P Glucagon in ER with signs of ongoing food impaction. Wife at bedside.  Past Medical History:  Diagnosis Date   Allergic rhinitis    Asthma    slight   Bell's palsy    GERD (gastroesophageal reflux disease)    Gout    Hypercholesteremia    Melanoma (HCC)    Osteoarthritis    Sleep apnea    Resolving   Thrombocytopenia (HCC)    Tubular adenoma     Past Surgical History:  Procedure Laterality Date   ESOPHAGOGASTRODUODENOSCOPY (EGD) WITH PROPOFOL N/A 12/28/2022   Procedure: ESOPHAGOGASTRODUODENOSCOPY (EGD) WITH PROPOFOL;  Surgeon: Charlott Rakes, MD;  Location: [facility];  Service: Gastroenterology;  Laterality: N/A;   IMPACTION REMOVAL  12/28/2022   Procedure: IMPACTION REMOVAL;  Surgeon: Charlott Rakes, MD;  Location: [facility];  Service: Gastroenterology;;   IR FLUORO GUIDE PORT INSERTION RIGHT  02/17/2017   IR REMOVAL TUN ACCESS W/ PORT W/O FL MOD SED  01/05/2018   IR US GUIDE VASC ACCESS RIGHT  02/17/2017   KNEE SURGERY  1993   MELANOMA EXCISION     NASAL RECONSTRUCTION     VASECTOMY  1999    Prior to Admission medications   Medication Sig Start Date End Date Taking? Authorizing Provider  benzonatate (TESSALON) 200 MG capsule Take 1 capsule (200 mg total) by mouth 3 (three) times daily as needed for cough. 05/14/22  Yes Kalman Shan, MD  CALCIUM PO Take 1 tablet by mouth  daily. Pt takes when remember to   Yes [provider]  Cyanocobalamin (VITAMIN B-12 PO) Take by mouth.   Yes [provider]  fluticasone-salmeterol (WIXELA INHUB) 250-50 MCG/ACT AEPB Inhale 1 puff into the lungs in the morning and at bedtime. 05/14/22  Yes Kalman Shan, MD  Evolocumab (REPATHA) 140 MG/ML SOSY Inject 140 mg into the skin every 14 (fourteen) days. Patient taking differently: Inject 140 mg into the skin every 14 (fourteen) days. Every other Monday 11/13/22   Croitoru, Mihai, MD  rosuvastatin (CRESTOR) 10 MG tablet Take 10 mg by mouth daily. Patient not taking: Reported on 12/28/2022    [provider]  Vitamin D, Ergocalciferol, (DRISDOL) 1.25 MG (50000 UNIT) CAPS capsule Take 1 capsule (50,000 Units total) by mouth once a week. 09/25/22   Johney Maine, MD    Scheduled Meds: Continuous Infusions:  sodium chloride     PRN Meds:.  Allergies as of 01/29/2023   (No Known Allergies)    Family History  Problem Relation Age of Onset   Diabetes Mother    Asthma Mother    Gout Brother    Colon cancer Paternal Uncle    Esophageal cancer Neg Hx    Stomach cancer Neg Hx     Social History   Socioeconomic History   Marital status: Married    Spouse name: Not on file   Number of children: Not on file  Years of education: Not on file   Highest education level: Not on file  Occupational History   Occupation: Primary school teacher  Tobacco Use   Smoking status: Never    Passive exposure: Past   Smokeless tobacco: Never  Vaping Use   Vaping status: Never Used  Substance and Sexual Activity   Alcohol use: Yes    Alcohol/week: 5.0 standard drinks of alcohol    Types: 5 Glasses of wine per week   Drug use: No   Sexual activity: Yes    Partners: Female  Other Topics Concern   Not on file  Social History Narrative   Not on file   Social Determinants of Health   Financial Resource Strain: Not on file  Food Insecurity: Not on file   Transportation Needs: Not on file  Physical Activity: Not on file  Stress: Not on file  Social Connections: Not on file  Intimate Partner Violence: Not on file    Review of Systems: All negative except as stated above in HPI.  Physical Exam: Vital signs: Vitals:   01/29/23 1342 01/29/23 1514  BP: 131/79 (!) 155/94  Pulse: 74 80  Resp: 16 (!) 21  Temp: 98.3 F (36.8 C) 98 F (36.7 C)  SpO2: 96% 96%     General:   Alert,  Well-developed, well-nourished, pleasant and cooperative in NAD Head: normocephalic, atraumatic Eyes: anicteric sclera ENT: oropharynx clear Neck: supple, nontender Lungs:  Clear throughout to auscultation.   No wheezes, crackles, or rhonchi. No acute distress. Heart:  Regular rate and rhythm; no murmurs, clicks, rubs,  or gallops. Abdomen: soft, nontender, nondistended, +BS  Rectal:  Deferred Ext: no edema  GI:  Lab Results: Recent Labs    01/29/23 1422  WBC 12.1*  HGB 17.3*  HCT 52.3*  PLT 129*   BMET Recent Labs    01/29/23 1422  NA 139  K 4.3  CL 102  CO2 27  GLUCOSE 124*  BUN 9  CREATININE 0.74  CALCIUM 9.8   LFT No results for input(s): "PROT", "ALBUMIN", "AST", "ALT", "ALKPHOS", "BILITOT", "BILIDIR", "IBILI" in the last 72 hours. PT/INR No results for input(s): "LABPROT", "INR" in the last 72 hours.   Studies/Results: No results found.  Impression/Plan: Food impaction in setting of esophageal balloon dilation to 18 mm last week suggests esophageal dysmotility and will need an esophageal manometry in near future to evaluate for achalasia. EGD now to remove food bolus. Risks/benefits of EGD discussed and he agrees to proceed. Anesthesia team for sedation.    LOS: 0 days   Shirley Friar  01/29/2023, 3:19 PM  Questions please call [phone]

## 2023-01-29 NOTE — ED Provider Notes (Signed)
[facility] EMERGENCY DEPARTMENT AT [facility] Provider Note   CSN: [id number] Arrival date & time: 01/29/23  1258     History Chief Complaint  Patient presents with   Emesis    Food bolus stuck in throat    Joseph Powell is a 67 y.o. male patient with history of Schatzki's ring who presents to the Emergency Department with a food impaction that occurred last night.  Patient states that he had some pork chop and then was having this sensation that it is stuck.  Patient had this roughly 1 month ago and saw Dr. Bosie Clos for a emergent endoscopy.  Patient has not been able to tolerate any solids or fluids since.  He denies any shortness of breath or chest pain.   Emesis      Home Medications Prior to Admission medications   Medication Sig Start Date End Date Taking? Authorizing Provider  benzonatate (TESSALON) 200 MG capsule Take 1 capsule (200 mg total) by mouth 3 (three) times daily as needed for cough. 05/14/22  Yes Kalman Shan, MD  CALCIUM PO Take 1 tablet by mouth daily. Pt takes when remember to   Yes [provider]  Cyanocobalamin (VITAMIN B-12 PO) Take by mouth.   Yes [provider]  fluticasone-salmeterol (WIXELA INHUB) 250-50 MCG/ACT AEPB Inhale 1 puff into the lungs in the morning and at bedtime. 05/14/22  Yes Kalman Shan, MD  Evolocumab (REPATHA) 140 MG/ML SOSY Inject 140 mg into the skin every 14 (fourteen) days. Patient taking differently: Inject 140 mg into the skin every 14 (fourteen) days. Every other Monday 11/13/22   Croitoru, Mihai, MD  rosuvastatin (CRESTOR) 10 MG tablet Take 10 mg by mouth daily. Patient not taking: Reported on 12/28/2022    [provider]  Vitamin D, Ergocalciferol, (DRISDOL) 1.25 MG (50000 UNIT) CAPS capsule Take 1 capsule (50,000 Units total) by mouth once a week. 09/25/22   Johney Maine, MD      Allergies    Patient has no known allergies.    Review of Systems   Review of  Systems  Gastrointestinal:  Positive for vomiting.  All other systems reviewed and are negative.   Physical Exam Updated Vital Signs BP (!) 155/94   Pulse 80   Temp 98 F (36.7 C) (Temporal)   Resp (!) 21   Ht 5\' 10"  (1.778 m)   Wt 81.2 kg   SpO2 96%   BMI 25.69 kg/m  Physical Exam Vitals and nursing note reviewed.  Constitutional:      General: He is not in acute distress.    Appearance: Normal appearance.  HENT:     Head: Normocephalic and atraumatic.  Eyes:     General:        Right eye: No discharge.        Left eye: No discharge.  Cardiovascular:     Comments: Regular rate and rhythm.  S1/S2 are distinct without any evidence of murmur, rubs, or gallops.  Radial pulses are 2+ bilaterally.  Dorsalis pedis pulses are 2+ bilaterally.  No evidence of pedal edema. Pulmonary:     Comments: Clear to auscultation bilaterally.  Normal effort.  No respiratory distress.  No evidence of wheezes, rales, or rhonchi heard throughout. Abdominal:     General: Abdomen is flat. Bowel sounds are normal. There is no distension.     Tenderness: There is no abdominal tenderness. There is no guarding or rebound.  Musculoskeletal:  General: Normal range of motion.     Cervical back: Neck supple.  Skin:    General: Skin is warm and dry.     Findings: No rash.  Neurological:     General: No focal deficit present.     Mental Status: He is alert.  Psychiatric:        Mood and Affect: Mood normal.        Behavior: Behavior normal.     ED Results / Procedures / Treatments   Labs (all labs ordered are listed, but only abnormal results are displayed) Labs Reviewed  CBC WITH DIFFERENTIAL/PLATELET - Abnormal; Notable for the following components:      Result Value   WBC 12.1 (*)    Hemoglobin 17.3 (*)    HCT 52.3 (*)    Platelets 129 (*)    Neutro Abs 7.8 (*)    Monocytes Absolute 2.7 (*)    Abs Immature Granulocytes 0.16 (*)    All other components within normal limits  BASIC  METABOLIC PANEL - Abnormal; Notable for the following components:   Glucose, Bld 124 (*)    All other components within normal limits    EKG None  Radiology No results found.  Procedures Procedures    Medications Ordered in ED Medications  0.9 %  sodium chloride infusion (has no administration in time range)  lactated ringers infusion (1,000 mLs Intravenous New Bag/Given 01/29/23 1521)  glucagon (human recombinant) (GLUCAGEN) injection 1 mg (1 mg Intravenous Given 01/29/23 1454)    ED Course/ Medical Decision Making/ A&P Clinical Course as of 01/29/23 1559  Thu Jan 29, 2023  1357 Spoke with Dr. Bosie Clos with gastroenterology who recommends glucagon and calling him back in 1 hour. [CF]  1556 Patient was taken to get an endoscopy with Dr. Bosie Clos.  I was not notified about this but the patient will be admitted to their service. [CF]  1557 CBC with Differential(!) There is some evidence of leukocytosis. [CF]  1557 Basic metabolic panel(!) Normal. [CF]    Clinical Course User Index [CF] Teressa Lower, PA-C   {   Click here for ABCD2, HEART and other calculators  Medical Decision Making KRISTION HOLIFIELD is a 67 y.o. male patient who presents to the emergency department for further evaluation of a food impaction.  Patient has a known Schatzki's ring.  Dr. Bosie Clos is on-call.  I will go ahead and get him involved early as the patient likely needs a dilation.  Patient needed to have this roughly 1 month ago.  He is stable at this time with normal vital signs in no acute distress.  He is in no respiratory distress at this time either.  Will get some basic labs considering that the patient has not been able to tolerate fluids since last night to evaluate for possible dehydration.  Patient taken to get an endoscopy with Dr. Bosie Clos and admitted to their service.  Amount and/or Complexity of Data Reviewed Labs: ordered.  Risk Prescription drug management.    Final  Clinical Impression(s) / ED Diagnoses Final diagnoses:  Food impaction of esophagus, initial encounter    Rx / DC Orders ED Discharge Orders     None         Teressa Lower, [place] 01/29/23 1600    Margarita Grizzle, MD 02/01/23 [phone]

## 2023-01-29 NOTE — ED Triage Notes (Signed)
Pt arrived POV c/o food caught in throat after dinner last night, pt called his GI doctor this morning who recommended he be seen in the ED. Pt has had approx 5-6 episodes of vomiting. Denies pain or any other symptoms. Had esophageal dilation on 01/20/23.

## 2023-01-29 NOTE — H&P (View-Only) (Signed)
Referring Provider: ER Primary Care Physician:  Tally Joe, MD Primary Gastroenterologist:  Dr. Dulce Sellar  Reason for Consultation:  Food Impaction  HPI: Joseph Powell is a 67 y.o. male with history of dysphagia and food impaction in August presents with sensation of pork hanging up after 2 bites last night and unable to swallow saliva or liquids since then. Chest pressure not as bad now compared with last night following food hanging up. S/P EGD with balloon dilation (TTS 18 mm) of distal esophageal stricture on 01/20/23 (Dr. Dulce Sellar) and was eating without difficulty until last night. S/P Glucagon in ER with signs of ongoing food impaction. Wife at bedside.  Past Medical History:  Diagnosis Date   Allergic rhinitis    Asthma    slight   Bell's palsy    GERD (gastroesophageal reflux disease)    Gout    Hypercholesteremia    Melanoma (HCC)    Osteoarthritis    Sleep apnea    Resolving   Thrombocytopenia (HCC)    Tubular adenoma     Past Surgical History:  Procedure Laterality Date   ESOPHAGOGASTRODUODENOSCOPY (EGD) WITH PROPOFOL N/A 12/28/2022   Procedure: ESOPHAGOGASTRODUODENOSCOPY (EGD) WITH PROPOFOL;  Surgeon: Charlott Rakes, MD;  Location: [facility];  Service: Gastroenterology;  Laterality: N/A;   IMPACTION REMOVAL  12/28/2022   Procedure: IMPACTION REMOVAL;  Surgeon: Charlott Rakes, MD;  Location: [facility];  Service: Gastroenterology;;   IR FLUORO GUIDE PORT INSERTION RIGHT  02/17/2017   IR REMOVAL TUN ACCESS W/ PORT W/O FL MOD SED  01/05/2018   IR US GUIDE VASC ACCESS RIGHT  02/17/2017   KNEE SURGERY  1993   MELANOMA EXCISION     NASAL RECONSTRUCTION     VASECTOMY  1999    Prior to Admission medications   Medication Sig Start Date End Date Taking? Authorizing Provider  benzonatate (TESSALON) 200 MG capsule Take 1 capsule (200 mg total) by mouth 3 (three) times daily as needed for cough. 05/14/22  Yes Kalman Shan, MD  CALCIUM PO Take 1 tablet by mouth  daily. Pt takes when remember to   Yes [provider]  Cyanocobalamin (VITAMIN B-12 PO) Take by mouth.   Yes [provider]  fluticasone-salmeterol (WIXELA INHUB) 250-50 MCG/ACT AEPB Inhale 1 puff into the lungs in the morning and at bedtime. 05/14/22  Yes Kalman Shan, MD  Evolocumab (REPATHA) 140 MG/ML SOSY Inject 140 mg into the skin every 14 (fourteen) days. Patient taking differently: Inject 140 mg into the skin every 14 (fourteen) days. Every other Monday 11/13/22   Croitoru, Mihai, MD  rosuvastatin (CRESTOR) 10 MG tablet Take 10 mg by mouth daily. Patient not taking: Reported on 12/28/2022    [provider]  Vitamin D, Ergocalciferol, (DRISDOL) 1.25 MG (50000 UNIT) CAPS capsule Take 1 capsule (50,000 Units total) by mouth once a week. 09/25/22   Johney Maine, MD    Scheduled Meds: Continuous Infusions:  sodium chloride     PRN Meds:.  Allergies as of 01/29/2023   (No Known Allergies)    Family History  Problem Relation Age of Onset   Diabetes Mother    Asthma Mother    Gout Brother    Colon cancer Paternal Uncle    Esophageal cancer Neg Hx    Stomach cancer Neg Hx     Social History   Socioeconomic History   Marital status: Married    Spouse name: Not on file   Number of children: Not on file  Years of education: Not on file   Highest education level: Not on file  Occupational History   Occupation: Primary school teacher  Tobacco Use   Smoking status: Never    Passive exposure: Past   Smokeless tobacco: Never  Vaping Use   Vaping status: Never Used  Substance and Sexual Activity   Alcohol use: Yes    Alcohol/week: 5.0 standard drinks of alcohol    Types: 5 Glasses of wine per week   Drug use: No   Sexual activity: Yes    Partners: Female  Other Topics Concern   Not on file  Social History Narrative   Not on file   Social Determinants of Health   Financial Resource Strain: Not on file  Food Insecurity: Not on file   Transportation Needs: Not on file  Physical Activity: Not on file  Stress: Not on file  Social Connections: Not on file  Intimate Partner Violence: Not on file    Review of Systems: All negative except as stated above in HPI.  Physical Exam: Vital signs: Vitals:   01/29/23 1342 01/29/23 1514  BP: 131/79 (!) 155/94  Pulse: 74 80  Resp: 16 (!) 21  Temp: 98.3 F (36.8 C) 98 F (36.7 C)  SpO2: 96% 96%     General:   Alert,  Well-developed, well-nourished, pleasant and cooperative in NAD Head: normocephalic, atraumatic Eyes: anicteric sclera ENT: oropharynx clear Neck: supple, nontender Lungs:  Clear throughout to auscultation.   No wheezes, crackles, or rhonchi. No acute distress. Heart:  Regular rate and rhythm; no murmurs, clicks, rubs,  or gallops. Abdomen: soft, nontender, nondistended, +BS  Rectal:  Deferred Ext: no edema  GI:  Lab Results: Recent Labs    01/29/23 1422  WBC 12.1*  HGB 17.3*  HCT 52.3*  PLT 129*   BMET Recent Labs    01/29/23 1422  NA 139  K 4.3  CL 102  CO2 27  GLUCOSE 124*  BUN 9  CREATININE 0.74  CALCIUM 9.8   LFT No results for input(s): "PROT", "ALBUMIN", "AST", "ALT", "ALKPHOS", "BILITOT", "BILIDIR", "IBILI" in the last 72 hours. PT/INR No results for input(s): "LABPROT", "INR" in the last 72 hours.   Studies/Results: No results found.  Impression/Plan: Food impaction in setting of esophageal balloon dilation to 18 mm last week suggests esophageal dysmotility and will need an esophageal manometry in near future to evaluate for achalasia. EGD now to remove food bolus. Risks/benefits of EGD discussed and he agrees to proceed. Anesthesia team for sedation.    LOS: 0 days   Shirley Friar  01/29/2023, 3:19 PM  Questions please call [phone]

## 2023-01-29 NOTE — Discharge Instructions (Addendum)
CLEAR LIQUIDS ONLY TODAY. Tomorrow advance diet as tolerated to soft foods. Avoid all meats and other solid foods until further notice.  YOU HAD AN ENDOSCOPIC PROCEDURE TODAY: Refer to the procedure report and other information in the discharge instructions given to you for any specific questions about what was found during the examination. If this information does not answer your questions, please call [facility] office at [phone] to clarify.   YOU SHOULD EXPECT: Some feelings of bloating in the abdomen. Passage of more gas than usual. Walking can help get rid of the air that was put into your GI tract during the procedure and reduce the bloating. If you had a lower endoscopy (such as a colonoscopy or flexible sigmoidoscopy) you may notice spotting of blood in your stool or on the toilet paper. Some abdominal soreness may be present for a day or two, also.  DIET: Your first meal following the procedure should be a light meal and then it is ok to progress to your normal diet. A half-sandwich or bowl of soup is an example of a good first meal. Heavy or fried foods are harder to digest and may make you feel nauseous or bloated. Drink plenty of fluids but you should avoid alcoholic beverages for 24 hours. If you had a esophageal dilation, please see attached instructions for diet.    ACTIVITY: Your care partner should take you home directly after the procedure. You should plan to take it easy, moving slowly for the rest of the day. You can resume normal activity the day after the procedure however YOU SHOULD NOT DRIVE, use power tools, machinery or perform tasks that involve climbing or major physical exertion for 24 hours (because of the sedation medicines used during the test).   SYMPTOMS TO REPORT IMMEDIATELY: A gastroenterologist can be reached at any hour. Please call [phone]  for any of the following symptoms:  Following lower endoscopy (colonoscopy, flexible sigmoidoscopy) Excessive amounts  of blood in the stool  Significant tenderness, worsening of abdominal pains  Swelling of the abdomen that is new, acute  Fever of 100 or higher  Following upper endoscopy (EGD, EUS, ERCP, esophageal dilation) Vomiting of blood or coffee ground material  New, significant abdominal pain  New, significant chest pain or pain under the shoulder blades  Painful or persistently difficult swallowing  New shortness of breath  Black, tarry-looking or red, bloody stools  FOLLOW UP:  If any biopsies were taken you will be contacted by phone or by letter within the next 1-3 weeks. Call [phone]  if you have not heard about the biopsies in 3 weeks.  Please also call with any specific questions about appointments or follow up tests.

## 2023-01-29 NOTE — Anesthesia Preprocedure Evaluation (Addendum)
Anesthesia Evaluation  Patient identified by MRN, date of birth, ID band Patient awake    Reviewed: Allergy & Precautions, H&P , NPO status , Patient's Chart, lab work & pertinent test results  Airway Mallampati: II  TM Distance: >3 FB Neck ROM: Full    Dental no notable dental hx. (+) Teeth Intact, Dental Advisory Given   Pulmonary asthma    Pulmonary exam normal breath sounds clear to auscultation       Cardiovascular negative cardio ROS  Rhythm:Regular Rate:Normal     Neuro/Psych negative neurological ROS  negative psych ROS   GI/Hepatic Neg liver ROS,GERD  Medicated,,  Endo/Other  negative endocrine ROS    Renal/GU negative Renal ROS  negative genitourinary   Musculoskeletal  (+) Arthritis , Osteoarthritis,    Abdominal   Peds  Hematology negative hematology ROS (+)   Anesthesia Other Findings   Reproductive/Obstetrics negative OB ROS                             Anesthesia Physical Anesthesia Plan  ASA: 2 and emergent  Anesthesia Plan: General   Post-op Pain Management: Minimal or no pain anticipated   Induction: Intravenous, Rapid sequence and Cricoid pressure planned  PONV Risk Score and Plan: 3 and Ondansetron, Dexamethasone and Treatment may vary due to age or medical condition  Airway Management Planned: Oral ETT  Additional Equipment:   Intra-op Plan:   Post-operative Plan: Extubation in OR  Informed Consent: I have reviewed the patients History and Physical, chart, labs and discussed the procedure including the risks, benefits and alternatives for the proposed anesthesia with the patient or authorized representative who has indicated his/her understanding and acceptance.     Dental advisory given  Plan Discussed with: CRNA  Anesthesia Plan Comments:        Anesthesia Quick Evaluation

## 2023-01-29 NOTE — Interval H&P Note (Signed)
History and Physical Interval Note:  01/29/2023 3:23 PM  Joseph Powell  has presented today for surgery, with the diagnosis of food impaction.  The various methods of treatment have been discussed with the patient and family. After consideration of risks, benefits and other options for treatment, the patient has consented to  Procedure(s): ESOPHAGOGASTRODUODENOSCOPY (EGD) (N/A) as a surgical intervention.  The patient's history has been reviewed, patient examined, no change in status, stable for surgery.  I have reviewed the patient's chart and labs.  Questions were answered to the patient's satisfaction.     Shirley Friar

## 2023-01-29 NOTE — Anesthesia Procedure Notes (Signed)
Procedure Name: Intubation Date/Time: 01/29/2023 4:01 PM  Performed by: Deri Fuelling, CRNAPre-anesthesia Checklist: Patient identified, Emergency Drugs available, Suction available and Patient being monitored Patient Re-evaluated:Patient Re-evaluated prior to induction Oxygen Delivery Method: Circle system utilized Preoxygenation: Pre-oxygenation with 100% oxygen Induction Type: IV induction Ventilation: Mask ventilation without difficulty Laryngoscope Size: Mac and 4 Grade View: Grade II Tube type: Oral Tube size: 7.5 mm Number of attempts: 1 Airway Equipment and Method: Stylet and Oral airway Placement Confirmation: ETT inserted through vocal cords under direct vision, positive ETCO2 and breath sounds checked- equal and bilateral Secured at: 23 cm Tube secured with: Tape Dental Injury: Teeth and Oropharynx as per pre-operative assessment

## 2023-01-29 NOTE — Op Note (Signed)
[facility] Patient Name: Joseph Powell Procedure Date: 01/29/2023 MRN: [id number] Attending MD: Shirley Friar , MD, [id number] Date of Birth: Jun 01, 1955 CSN: [id number] Age: 67 Admit Type: Inpatient Procedure:                Upper GI endoscopy Indications:              Dysphagia, Foreign body in the esophagus Providers:                Shirley Friar, MD, Stephens Shire RN, RN,                            Adin Hector, RN, Rozetta Nunnery, Technician Referring MD:             ER Medicines:                Propofol per Anesthesia, Monitored Anesthesia Care Complications:            No immediate complications. Estimated Blood Loss:     Estimated blood loss: none. Procedure:                Pre-Anesthesia Assessment:                           - Prior to the procedure, a History and Physical                            was performed, and patient medications and                            allergies were reviewed. The patient's tolerance of                            previous anesthesia was also reviewed. The risks                            and benefits of the procedure and the sedation                            options and risks were discussed with the patient.                            All questions were answered, and informed consent                            was obtained. Prior Anticoagulants: The patient has                            taken no anticoagulant or antiplatelet agents. ASA                            Grade Assessment: II - A patient with mild systemic                            disease. After reviewing the risks and benefits,  the patient was deemed in satisfactory condition to                            undergo the procedure.                           After obtaining informed consent, the endoscope was                            passed under direct vision. Throughout the                            procedure, the patient's  blood pressure, pulse, and                            oxygen saturations were monitored continuously. The                            [id number]-[id number] ([id number]) Olympus endoscope was introduced                            through the mouth, and advanced to the second part                            of duodenum. The upper GI endoscopy was                            accomplished without difficulty. The patient                            tolerated the procedure well. Scope In: Scope Out: Findings:      Food was found in the distal esophagus. Removal was accomplished with a       Roth net.      The Z-line was regular and was found 42 cm from the incisors.      Segmental mild inflammation characterized by congestion (edema) and       erythema was found in the gastric antrum.      The cardia and gastric fundus were normal on retroflexion.      Patchy mild mucosal changes characterized by congestion and erythema       were found in the duodenal bulb. Impression:               - Food in the distal esophagus. Removal was                            successful.                           - Z-line regular, 42 cm from the incisors.                           - Acute gastritis.                           - Mucosal changes in the duodenum. Moderate Sedation:  N/A - MAC procedure Recommendation:           - Patient has a contact number available for                            emergencies. The signs and symptoms of potential                            delayed complications were discussed with the                            patient. Return to normal activities tomorrow.                            Written discharge instructions were provided to the                            patient.                           - Advance diet as tolerated (to soft diet) and                            clear liquid diet.                           - Perform ambulatory esophageal manometry next week. Procedure Code(s):        ---  Professional ---                           [phone], Esophagogastroduodenoscopy, flexible,                            transoral; with removal of foreign body(s) Diagnosis Code(s):        --- Professional ---                           V25.366Y, Food in esophagus causing other injury,                            initial encounter                           T18.108A, Unspecified foreign body in esophagus                            causing other injury, initial encounter                           R13.10, Dysphagia, unspecified                           K29.00, Acute gastritis without bleeding                           K31.89, Other diseases of stomach and duodenum CPT copyright 2022 American Medical Association. All rights reserved. The codes  documented in this report are preliminary and upon coder review may  be revised to meet current compliance requirements. Shirley Friar, MD 01/29/2023 4:15:43 PM This report has been signed electronically. Number of Addenda: 0

## 2023-01-30 NOTE — Anesthesia Postprocedure Evaluation (Signed)
Anesthesia Post Note  Patient: Joseph Powell  Procedure(s) Performed: ESOPHAGOGASTRODUODENOSCOPY (EGD) FOREIGN BODY REMOVAL     Patient location during evaluation: Endoscopy Anesthesia Type: General Level of consciousness: awake and alert Pain management: pain level controlled Vital Signs Assessment: post-procedure vital signs reviewed and stable Respiratory status: spontaneous breathing, nonlabored ventilation and respiratory function stable Cardiovascular status: stable and blood pressure returned to baseline Postop Assessment: no apparent nausea or vomiting Anesthetic complications: no  No notable events documented.  Last Vitals:  Vitals:   01/29/23 1642 01/29/23 1643  BP:    Pulse: 80 79  Resp: 12 12  Temp:    SpO2: 95% 94%    Last Pain:  Vitals:   01/29/23 1640  TempSrc:   PainSc: 0-No pain                 Christana Angelica,W. EDMOND

## 2023-02-01 ENCOUNTER — Encounter (HOSPITAL_COMMUNITY): Payer: Self-pay | Admitting: Gastroenterology

## 2023-02-04 ENCOUNTER — Ambulatory Visit (HOSPITAL_COMMUNITY)
Admission: RE | Admit: 2023-02-04 | Discharge: 2023-02-04 | Disposition: A | Discharge status: Home / Self Care | Payer: No Typology Code available for payment source | Attending: Gastroenterology | Admitting: Gastroenterology

## 2023-02-04 ENCOUNTER — Encounter (HOSPITAL_COMMUNITY)
Admission: RE | Disposition: A | Discharge status: Home / Self Care | Payer: Self-pay | Source: Home / Self Care | Attending: Gastroenterology

## 2023-02-04 ENCOUNTER — Encounter (HOSPITAL_COMMUNITY): Payer: Self-pay | Admitting: Gastroenterology

## 2023-02-04 DIAGNOSIS — R1310 Dysphagia, unspecified: Secondary | ICD-10-CM | POA: Diagnosis present

## 2023-02-04 HISTORY — PX: ESOPHAGEAL MANOMETRY: SHX5429

## 2023-02-04 SURGERY — MANOMETRY, ESOPHAGUS

## 2023-02-04 MED ORDER — LIDOCAINE VISCOUS HCL 2 % MT SOLN
OROMUCOSAL | Status: AC
  Filled 2023-02-04: qty 15

## 2023-02-04 SURGICAL SUPPLY — 2 items
FACESHIELD LNG OPTICON STERILE (SAFETY) IMPLANT
GLOVE BIO SURGEON STRL SZ8 (GLOVE) ×2 IMPLANT

## 2023-02-04 NOTE — Progress Notes (Signed)
Esophageal manometry performed per protocol.  Patient tolerated well.

## 2023-02-09 ENCOUNTER — Encounter (HOSPITAL_COMMUNITY): Payer: Self-pay | Admitting: Gastroenterology

## 2023-02-25 ENCOUNTER — Encounter: Payer: Self-pay | Admitting: Cardiovascular Disease

## 2023-02-25 NOTE — Telephone Encounter (Signed)
Please send in a prescription for bisoprolol 5 mg once daily for blood pressure control.

## 2023-02-26 MED ORDER — BISOPROLOL FUMARATE 5 MG PO TABS: 5.0000 mg | ORAL_TABLET | Freq: Every day | ORAL | 3 refills | Status: DC

## 2023-03-01 ENCOUNTER — Other Ambulatory Visit: Payer: Self-pay | Admitting: Hematology

## 2023-03-01 ENCOUNTER — Other Ambulatory Visit: Payer: Self-pay | Admitting: Internal Medicine

## 2023-03-01 DIAGNOSIS — C8442 Peripheral T-cell lymphoma, not classified, intrathoracic lymph nodes: Secondary | ICD-10-CM

## 2023-03-10 ENCOUNTER — Other Ambulatory Visit: Payer: Self-pay | Admitting: Hematology

## 2023-03-10 DIAGNOSIS — C8442 Peripheral T-cell lymphoma, not classified, intrathoracic lymph nodes: Secondary | ICD-10-CM

## 2023-05-26 ENCOUNTER — Other Ambulatory Visit: Payer: Self-pay | Admitting: Hematology

## 2023-05-26 DIAGNOSIS — C8442 Peripheral T-cell lymphoma, not classified, intrathoracic lymph nodes: Secondary | ICD-10-CM

## 2023-06-09 ENCOUNTER — Encounter: Payer: Self-pay | Admitting: Allergy and Immunology

## 2023-06-09 ENCOUNTER — Ambulatory Visit (INDEPENDENT_AMBULATORY_CARE_PROVIDER_SITE_OTHER): Payer: Medicare Other | Admitting: Allergy and Immunology

## 2023-06-09 VITALS — BP 124/76 | HR 67 | Temp 98.3°F | Resp 18 | Ht 69.0 in | Wt 182.0 lb

## 2023-06-09 DIAGNOSIS — J324 Chronic pansinusitis: Secondary | ICD-10-CM | POA: Diagnosis not present

## 2023-06-09 DIAGNOSIS — H7013 Chronic mastoiditis, bilateral: Secondary | ICD-10-CM

## 2023-06-09 DIAGNOSIS — D801 Nonfamilial hypogammaglobulinemia: Secondary | ICD-10-CM | POA: Diagnosis not present

## 2023-06-09 DIAGNOSIS — H7011 Chronic mastoiditis, right ear: Secondary | ICD-10-CM

## 2023-06-09 MED ORDER — BUDESONIDE 0.5 MG/2ML IN SUSP: RESPIRATORY_TRACT | 0 refills | Status: DC

## 2023-06-09 MED ORDER — AMOXICILLIN-POT CLAVULANATE 875-125 MG PO TABS: 1.0000 | ORAL_TABLET | Freq: Two times a day (BID) | ORAL | 0 refills | Status: DC

## 2023-06-09 NOTE — Progress Notes (Signed)
 [facility] [place] [facility] [place] - [place]   Dear Dr. Seabron,  Thank you for referring Joseph Powell to the [facility] [place]  on 06/09/2023.   Below is a summation of this patient's evaluation and recommendations.  Thank you for your referral. I will keep you informed about this patient's response to treatment.   If you have any questions please do not hesitate to contact me.   Sincerely,  Camellia DOROTHA Denis, MD Allergy  / Immunology [facility] [place]    ______________________________________________________________________    NEW PATIENT NOTE  Referring Provider: Seabron Lenis, MD Primary Provider: Seabron Lenis, MD Date of office visit: 06/09/2023    Subjective:   Chief Complaint:  Joseph Powell (DOB: 1955/11/23) is a 68 y.o. male who presents to the clinic on 06/09/2023 with a chief complaint of Cough and Sinusitis .     HPI: Joseph Powell presents to this clinic in evaluation of hypogammaglobulinemia.  Tim's complaint revolves around the fact that he has constant postnasal drip and throat clearing and a glob stuck in his throat and head fullness and tunnel hearing and blowing out ugly nasal discharge and decreased ability to smell along with cough.  Has been stuck in this issue for a few years.  Joseph Powell has hypogammaglobulinemia that has been documented over the course of the past several years.  He has been treated with intermittent and rare use of intravenous immunoglobulin infusions.  In review of his record his immunoglobulin levels never go above 650 and most of the time they were around 400-500 Mg/DL IgG without any detection of IgA or IgM.  This appears to be the result of a T-cell lymphoma that was treated with a stem cell transplant successfully 7 to 8 years ago.  He is not on any immunosuppressive drugs at this point in time.  He has been evaluated by both pulmonology and ENT for  his respiratory tract symptoms.  He tells me that he started Wixela for asthma several years ago but in review it sounds as though he has actually been treated with [facility] for the chronic cough that he has which is his choking cough because of all the drainage in his throat.  He has been tried on a nasal steroid for short period in time which did not help him.  He has been treated with antibiotics which has helped him.  He has been treated with Augmentin  which he definitely thinks he has helped.  Most recently is finishing up a course of clindamycin  started on 03 June 2023.SABRA  Past Medical History:  Diagnosis Date   Allergic rhinitis    Asthma    slight   Bell's palsy    GERD (gastroesophageal reflux disease)    Gout    Hypercholesteremia    Melanoma (HCC)    Osteoarthritis    Sleep apnea    Resolving   Thrombocytopenia (HCC)    Tubular adenoma     Past Surgical History:  Procedure Laterality Date   ESOPHAGEAL MANOMETRY N/A 02/04/2023   Procedure: ESOPHAGEAL MANOMETRY (EM);  Surgeon: Dianna Specking, MD;  Location: [facility];  Service: Gastroenterology;  Laterality: N/A;   ESOPHAGOGASTRODUODENOSCOPY N/A 01/29/2023   Procedure: ESOPHAGOGASTRODUODENOSCOPY (EGD);  Surgeon: Dianna Specking, MD;  Location: [facility];  Service: Gastroenterology;  Laterality: N/A;   ESOPHAGOGASTRODUODENOSCOPY (EGD) WITH PROPOFOL  N/A 12/28/2022   Procedure: ESOPHAGOGASTRODUODENOSCOPY (EGD) WITH PROPOFOL ;  Surgeon: Dianna Specking, MD;  Location: [facility];  Service: Gastroenterology;  Laterality: N/A;   FOREIGN BODY REMOVAL  01/29/2023   Procedure: FOREIGN BODY REMOVAL;  Surgeon: Dianna Specking, MD;  Location: [facility];  Service: Gastroenterology;;   IMPACTION REMOVAL  12/28/2022   Procedure: IMPACTION REMOVAL;  Surgeon: Dianna Specking, MD;  Location: [facility];  Service: Gastroenterology;;   IR FLUORO GUIDE PORT INSERTION RIGHT  02/17/2017   IR REMOVAL TUN ACCESS W/ PORT W/O FL  MOD SED  01/05/2018   IR US  GUIDE VASC ACCESS RIGHT  02/17/2017   KNEE SURGERY  1993   MELANOMA EXCISION     NASAL RECONSTRUCTION     VASECTOMY  1999    Allergies as of 06/09/2023   No Known Allergies      Medication List    benzonatate  200 MG capsule Commonly known as: TESSALON  Take 1 capsule (200 mg total) by mouth 3 (three) times daily as needed for cough.   bisoprolol  5 MG tablet Commonly known as: ZEBETA  Take 1 tablet (5 mg total) by mouth daily.   budesonide  0.5 MG/2ML nebulizer solution Commonly known as: Pulmicort  Nasal saline wash twice daily Started by: Seng Larch J Mikyle Sox   diphenhydrAMINE  25 mg capsule Commonly known as: BENADRYL  Take 25 mg by mouth in the morning, at noon, and at bedtime.   fluticasone -salmeterol 250-50 MCG/ACT Aepb Commonly known as: Wixela Inhub Inhale 1 puff into the lungs in the morning and at bedtime.   NON FORMULARY IgG Infusions   Repatha  140 MG/ML Sosy Generic drug: Evolocumab  Inject 140 mg into the skin every 14 (fourteen) days.   VITAMIN B-12 PO Take by mouth.   Vitamin D  (Ergocalciferol ) 1.25 MG (50000 UNIT) Caps capsule Commonly known as: DRISDOL  Take 1 capsule by mouth once a week    Review of systems negative except as noted in HPI / PMHx or noted below:  Review of Systems  Constitutional: Negative.   HENT: Negative.    Eyes: Negative.   Respiratory: Negative.    Cardiovascular: Negative.   Gastrointestinal: Negative.   Genitourinary: Negative.   Musculoskeletal: Negative.   Skin: Negative.   Neurological: Negative.   Endo/Heme/Allergies: Negative.   Psychiatric/Behavioral: Negative.      Family History  Problem Relation Age of Onset   Diabetes Mother    Asthma Mother    Gout Brother    Colon cancer Paternal Uncle    Esophageal cancer Neg Hx    Stomach cancer Neg Hx     Social History   Socioeconomic History   Marital status: Married    Spouse name: Not on file   Number of children: Not on file    Years of education: Not on file   Highest education level: Not on file  Occupational History   Occupation: primary school teacher  Tobacco Use   Smoking status: Never    Passive exposure: Past   Smokeless tobacco: Never  Vaping Use   Vaping status: Never Used  Substance and Sexual Activity   Alcohol use: Yes    Alcohol/week: 5.0 standard drinks of alcohol    Types: 5 Glasses of wine per week    Comment: several/ week   Drug use: No   Sexual activity: Yes    Partners: Female  Other Topics Concern   Not on file  Social History Narrative   Not on file   Environmental and Social history  Lives in a house with a dry environment, no animals located inside the household, carpet in the bedroom, plastic on the bed, no plastic  on the pillow, no smoking ongoing with inside the household.  Objective:   Vitals:   06/09/23 1343  BP: 124/76  Pulse: 67  Resp: 18  Temp: 98.3 F (36.8 C)  SpO2: 98%   Height: 5' 9 (175.3 cm) Weight: 182 lb (82.6 kg)  Physical Exam Constitutional:      Appearance: He is not diaphoretic.     Comments: Nasal voice, throat clearing, coughing  HENT:     Head: Normocephalic.     Right Ear: External ear normal.     Left Ear: External ear normal.     Nose: Nose normal. No mucosal edema or rhinorrhea.     Mouth/Throat:     Pharynx: Uvula midline. No oropharyngeal exudate.  Eyes:     Conjunctiva/sclera: Conjunctivae normal.  Neck:     Thyroid : No thyromegaly.     Trachea: Trachea normal. No tracheal tenderness or tracheal deviation.  Cardiovascular:     Rate and Rhythm: Normal rate and regular rhythm.     Heart sounds: Normal heart sounds, S1 normal and S2 normal. No murmur heard. Pulmonary:     Effort: No respiratory distress.     Breath sounds: Normal breath sounds. No stridor. No wheezing or rales.  Lymphadenopathy:     Head:     Right side of head: No tonsillar adenopathy.     Left side of head: No tonsillar adenopathy.     Cervical: No cervical  adenopathy.  Skin:    Findings: No erythema or rash.     Nails: There is no clubbing.  Neurological:     Mental Status: He is alert.     Diagnostics: Allergy  skin tests were not performed.  Results of blood tests obtained 21 February 2022 identifies IgE 2 KU/L with a negative aero allergen IgE profile  Results of blood tests obtained 14 April 2023 identifies IgG 428 Mg/DL, IgM less than 20 Mg/DL, IgA less than 10 Mg/DL, WBC 9.7, absolute eosinophil 0, absolute lymphocyte 2400, hemoglobin 14.7, platelet 111.SABRA  Results of a sinus CT scan obtained 04 April 2022 identifies the following:  Sinuses: There is mild mucosal thickening of the maxillary sinuses, right greater than left. There are no air-fluid levels identified. There is obstruction of the right ostiomeatal complex. There is partial opacification of right mastoid air cells.  Results of a cardiac CT scan obtained 07 August 2022 identifies the following:  Lungs/Pleura: Bibasilar linear scarring or subsegmental atelectasis. No pneumonia or pulmonary edema. No pleural effusion or pneumothorax.  Assessment and Plan:    1. Hypogammaglobulinemia (HCC)   2. Chronic pansinusitis   3. Chronic mastoiditis, right    1. To clear chronic sinusitis / mastoiditis / airway inflammation:   A. Augmentin  875 - 1 tablet 2 times per day for 30 days  B. Budesonide  0.5 / 200 ml nasal saline wash 2 times per day  C. Need IgG level at 1,000 mg/dl   2. If Oncologist agreeable, we would start SQ immunoglobulin infusions  3. Influenza = Tamiflu. Covid = Paxlovid  4. Return to clinic 30 days or earlier if problem  Joseph Powell will never clear his upper or lower airway or mastoid space completely of infectious organisms with an IgG less than 500, no IgM, and no IgA.  He needs immunoglobulin infusions aiming for a serum level of 1000 Mg/DL and we can provide him that with subcutaneous immunoglobulin infusion.  He will need to have a discussion with  his oncologist about having us  provide him  with subcutaneous immunoglobulin.  He has an appointment coming up in the next week.  I am going to place him on prolonged antibiotic administration and have him use upper airway anti-inflammatory nasal saline washes.  I will see him back in this clinic in 30 days or earlier if there is a problem.  Camellia DOROTHA Denis, MD Allergy  / Immunology [facility] [place] 

## 2023-06-09 NOTE — Patient Instructions (Addendum)
  1. To clear chronic sinusitis / mastoiditis / airway inflammation:   A. Augmentin  875 - 1 tablet 2 times per day for 30 days  B. Budesonide  0.5 / 200 ml nasal saline wash 2 times per day  C. Need IgG level at 1,000 mg/dl   2. If Oncologist agreeable, we would start SQ immunoglobulin infusions  3. Influenza = Tamiflu. Covid = Paxlovid  4. Return to clinic 30 days or earlier if problem

## 2023-06-10 ENCOUNTER — Encounter: Payer: Self-pay | Admitting: Allergy and Immunology

## 2023-07-03 ENCOUNTER — Other Ambulatory Visit: Payer: Self-pay | Admitting: Allergy and Immunology

## 2023-07-21 ENCOUNTER — Other Ambulatory Visit: Payer: Self-pay

## 2023-07-21 ENCOUNTER — Encounter: Payer: Self-pay | Admitting: Allergy and Immunology

## 2023-07-21 ENCOUNTER — Ambulatory Visit: Payer: Medicare Other | Admitting: Allergy and Immunology

## 2023-07-21 VITALS — BP 124/76 | HR 67 | Temp 97.2°F | Resp 18 | Ht 71.0 in | Wt 185.7 lb

## 2023-07-21 DIAGNOSIS — D801 Nonfamilial hypogammaglobulinemia: Secondary | ICD-10-CM | POA: Diagnosis not present

## 2023-07-21 DIAGNOSIS — J324 Chronic pansinusitis: Secondary | ICD-10-CM | POA: Diagnosis not present

## 2023-07-21 DIAGNOSIS — H7011 Chronic mastoiditis, right ear: Secondary | ICD-10-CM

## 2023-07-21 MED ORDER — AMOXICILLIN-POT CLAVULANATE 875-125 MG PO TABS: 1.0000 | ORAL_TABLET | Freq: Two times a day (BID) | ORAL | 0 refills | Status: AC

## 2023-07-21 NOTE — Patient Instructions (Signed)
  1. Prevent airway infection and inflammation:   A. Budesonide 0.5/200 ml nasal saline wash 1-2 times per day  C.  Cutaneous immunoglobulin infusions  2.  Check IgA/G/M prior to 11th infusion.  Change dose???  3.  Prescription for Augmentin 875 1 tablet twice a day for 14 days to be taken to [place] during springtime trip  4.  Letter for [employer] explaining need for transporting subcutaneous immunoglobulin/needles/infusion apparatus  5. Influenza = Tamiflu. Covid = Paxlovid  6. Return to clinic in 12 weeks or earlier if problem

## 2023-07-21 NOTE — Progress Notes (Unsigned)
 [place] - [place] - [place]   Follow-up Note  Referring Provider: Tally Joe, MD Primary Provider: Tally Joe, MD Date of Office Visit: 07/21/2023  Subjective:   Joseph Powell (DOB: [birth year]) is a 68 y.o. male who [facility] 07[facility]/2023 in re-evaluation of the following:  HPI: Joseph Powell returns to this clinic in evaluation of hypogammaglobulinemia.  I last saw him in this clinic for February 2025.  During his last visit he had documented hypogammaglobulinemia and what appeared to be chronic sinusitis with chronic cough and we started him on a therapy which includes subcutaneous immunoglobulin and Augmentin for 30 days and upper airway hygiene with budesonide twice a day.  He has resolved his cough.  He can smell and he has no ugly nasal discharge at this point.  Most of the drip in his throat along with throat clearing has resolved.  He no longer has any ear issues.  He has had an IV infusion of immunoglobulin and he has had 4 weekly subcutaneous infusions.  He is going to [place] later this spring.  Allergies as of 07/21/2023   No Known Allergies      Medication List    benzonatate 200 MG capsule Commonly known as: TESSALON Take 1 capsule (200 mg total) by mouth 3 (three) times daily as needed for cough.   bisoprolol 5 MG tablet Commonly known as: ZEBETA Take 1 tablet (5 mg total) by mouth daily.   budesonide 0.5 MG/2ML nebulizer solution Commonly known as: PULMICORT USE AS DIRECTED NASAL  SALINE  WASH  TWICE  DAILY   diphenhydrAMINE 25 mg capsule Commonly known as: BENADRYL Take 25 mg by mouth in the morning, at noon, and at bedtime.   fluticasone-salmeterol 250-50 MCG/ACT Aepb Commonly known as: Wixela Inhub Inhale 1 puff into the lungs in the morning and at bedtime.   NON FORMULARY IgG Infusions   Repatha 140 MG/ML Sosy Generic drug: Evolocumab Inject 140 mg into the skin every 14  (fourteen) days. What changed: additional instructions   VITAMIN B-12 PO Take by mouth.   Vitamin D (Ergocalciferol) 1.25 MG (50000 UNIT) Caps capsule Commonly known as: DRISDOL Take 1 capsule by mouth once a week    Past Medical History:  Diagnosis Date   Allergic rhinitis    Asthma    slight   Bell's palsy    GERD (gastroesophageal reflux disease)    Gout    Hypercholesteremia    Melanoma (HCC)    Osteoarthritis    Sleep apnea    Resolving   Thrombocytopenia (HCC)    Tubular adenoma     Past Surgical History:  Procedure Laterality Date   ESOPHAGEAL MANOMETRY N/A 02/04/2023   Procedure: ESOPHAGEAL MANOMETRY (EM);  Surgeon: Charlott Rakes, MD;  [facility]: [facility];  Service: Gastroenterology;  Laterality: N/A;   ESOPHAGOGASTRODUODENOSCOPY N/A 01/29/2023   Procedure: ESOPHAGOGASTRODUODENOSCOPY (EGD);  Surgeon: Charlott Rakes, MD;  Location: [facility];  Service: Gastroenterology;  Laterality: N/A;   ESOPHAGOGASTRODUODENOSCOPY (EGD) WITH PROPOFOL N/A 12/28/2022   Procedure: ESOPHAGOGASTRODUODENOSCOPY (EGD) WITH PROPOFOL;  Surgeon: Charlott Rakes, MD;  [facility];  Service: Gastroenterology;  Laterality: N/A;   FOREIGN BODY REMOVAL  01/29/2023   Procedure: FOREIGN BODY REMOVAL;  Surgeon: Charlott Rakes, MD;  [facility];  Service: Gastroenterology;;   IMPACTION REMOVAL  12/28/2022   Procedure: IMPACTION REMOVAL;  Surgeon: Charlott Rakes, MD;  [facility];  Service: Gastroenterology;;   IR FLUORO GUIDE  PORT INSERTION RIGHT  02/17/2017   IR REMOVAL TUN ACCESS W/ PORT W/O FL MOD SED  01/05/2018   IR US GUIDE VASC ACCESS RIGHT  02/17/2017   KNEE SURGERY  1993   MELANOMA EXCISION     NASAL RECONSTRUCTION     VASECTOMY  1999    Review of systems negative except as noted in HPI / PMHx or noted below:  Review of Systems  Constitutional: Negative.   HENT: Negative.    Eyes: Negative.   Respiratory: Negative.    Cardiovascular:  Negative.   Gastrointestinal: Negative.   Genitourinary: Negative.   Musculoskeletal: Negative.   Skin: Negative.   Neurological: Negative.   Endo/Heme/Allergies: Negative.   Psychiatric/Behavioral: Negative.       Objective:   Vitals:   07/21/23 0852  BP: 124/76  Pulse: 67  Resp: 18  Temp: (!) 97.2 F (36.2 C)  SpO2: 97%   Height: 5\' 11"  (180.3 cm)  Weight: 185 lb 11.2 oz (84.2 kg)   Physical Exam Constitutional:      Appearance: He is not diaphoretic.  HENT:     Head: Normocephalic.     Right Ear: Tympanic membrane, ear canal and external ear normal.     Left Ear: Tympanic membrane, ear canal and external ear normal.     Nose: Nose normal. No mucosal edema or rhinorrhea.     Mouth/Throat:     Pharynx: Uvula midline. No oropharyngeal exudate.  Eyes:     Conjunctiva/sclera: Conjunctivae normal.  Neck:     Thyroid: No thyromegaly.     Trachea: Trachea normal. No tracheal tenderness or tracheal deviation.  Cardiovascular:     Rate and Rhythm: Normal rate and regular rhythm.     Heart sounds: Normal heart sounds, S1 normal and S2 normal. No murmur heard. Pulmonary:     Effort: No respiratory distress.     Breath sounds: Normal breath sounds. No stridor. No wheezing or rales.  Lymphadenopathy:     Head:     Right side of head: No tonsillar adenopathy.     Left side of head: No tonsillar adenopathy.     Cervical: No cervical adenopathy.  Skin:    Findings: No erythema or rash.     Nails: There is no clubbing.  Neurological:     Mental Status: He is alert.     Diagnostics: none  Assessment and Plan:   1. Hypogammaglobulinemia (HCC)    1. Prevent airway infection and inflammation:   A. Budesonide 0.5/200 ml nasal saline wash 1-2 times per day  C.  Cutaneous immunoglobulin infusions  2.  Check IgA/G/M prior to 11th infusion.  Change dose???  3.  Prescription for Augmentin 875 1 tablet twice a day for 14 days to be taken to [place] during springtime  trip  4.  Letter for [employer] explaining need for transporting subcutaneous immunoglobulin/needles/infusion apparatus  5. Influenza = Tamiflu. Covid = Paxlovid  6. Return to clinic in 12 weeks or earlier if problem  Joseph Powell is doing much better regarding his hypogammaglobulinemia, chronic sinusitis, chronic mastoiditis.  We are going to continue him on subcutaneous immunoglobulin infusions and we are going to check his IgG level prior to his trip to [place] so that we can adjust his dose prior to that trip aiming for an IgG level of 1000 mg/dL.  I have given him a prescription for Augmentin so that he can take this on his trip to [place] in case he does develop a infection.  Will also provide him a letter for [employer] explaining the need for transporting his needles and infusion apparatus in his bottles of subcutaneous immunoglobulin.  Will see him back in this clinic in 12 weeks.  Laurette Schimke, MD Allergy / Immunology [facility]

## 2023-07-22 ENCOUNTER — Encounter: Payer: Self-pay | Admitting: Allergy and Immunology

## 2023-08-10 NOTE — Telephone Encounter (Unsigned)
 Copied from CRM [id number]. Topic: Clinical - Prescription Issue >> Aug 10, 2023 10:26 AM Theodis Sato wrote: Reason for CRM: Please send the patients following medications to his insurances preferred pharmacy has changed. fluticasone-salmeterol (WIXELA INHUB) 250-50 MCG/ACT AEPB // benzonatate (TESSALON) 200 MG capsule  Preferred pharmacy: [facility] [address], [place] - [address] [place] [address] Phone: [phone] Fax: [phone]

## 2023-08-24 ENCOUNTER — Ambulatory Visit (INDEPENDENT_AMBULATORY_CARE_PROVIDER_SITE_OTHER): Admitting: Adult Health

## 2023-08-24 ENCOUNTER — Encounter: Payer: Self-pay | Admitting: Adult Health

## 2023-08-24 VITALS — BP 140/80 | HR 74 | Temp 97.6°F | Ht 69.0 in | Wt 191.6 lb

## 2023-08-24 DIAGNOSIS — D801 Nonfamilial hypogammaglobulinemia: Secondary | ICD-10-CM | POA: Diagnosis not present

## 2023-08-24 DIAGNOSIS — J329 Chronic sinusitis, unspecified: Secondary | ICD-10-CM

## 2023-08-24 DIAGNOSIS — J189 Pneumonia, unspecified organism: Secondary | ICD-10-CM | POA: Diagnosis not present

## 2023-08-24 DIAGNOSIS — R053 Chronic cough: Secondary | ICD-10-CM

## 2023-08-24 DIAGNOSIS — J4530 Mild persistent asthma, uncomplicated: Secondary | ICD-10-CM

## 2023-08-24 MED ORDER — ALBUTEROL SULFATE HFA 108 (90 BASE) MCG/ACT IN AERS: 1.0000 | INHALATION_SPRAY | Freq: Four times a day (QID) | RESPIRATORY_TRACT | 2 refills | Status: AC | PRN

## 2023-08-24 MED ORDER — BENZONATATE 200 MG PO CAPS: 200.0000 mg | ORAL_CAPSULE | Freq: Three times a day (TID) | ORAL | 3 refills | Status: DC | PRN

## 2023-08-24 MED ORDER — FLUTICASONE-SALMETEROL 250-50 MCG/ACT IN AEPB: 1.0000 | INHALATION_SPRAY | Freq: Two times a day (BID) | RESPIRATORY_TRACT | 3 refills | Status: AC

## 2023-08-24 NOTE — Progress Notes (Unsigned)
 @Patient  ID: Joseph Powell, male    DOB: 11/23/1955, 68 y.o.   MRN: [id number]  Chief Complaint  Patient presents with   Follow-up    Referring provider: Rae Bugler, MD  HPI: 68 yo male never smoker followed for chronic cough, cough variant asthma, abnormal CT chest  Followed by immunology for hypogammaglobulinemia Medical history significant for high-grade T-cell non-Hodgkin's lymphoma status post CHOP and stem cell transplant (2019)followed by oncology, Chronic sinusitis  TEST/EVENTS :  Hospitalization June 2023 for acute hypoxic respiratory failure secondary to bilateral pneumonia in the setting of parainfluenza and haemophilus influenza w/ ARDS requiring BIPAP (was on a cruise in [place]),  -Blood cultures positive for haemophilus influenzae PNA. Viral panel + parainfluenza virus 3 - Echo August 2023 EF 55 to 60%, grade 1 diastolic dysfunction, right ventricular size normal, RV SF normal  11/2021 -IgG 129 L  12/2021 IgG 169 L  10/2022 Immunoglobulin panel - IgA <10, IgG 466, IgM <20 (all Low )   PFT 06/2022 no airflow obstruction or restriction.  Decreased diffusing capacity at 62%. Spirometry 03/10/2017  FEV1 3.29 (89%)  Ratio 86 p am Dulera  100 x 2 pffs   Allergy  profile 01/31/2021 >  Eos 0. /  IgE   <2 Allergy  panel 02/2022 neg, IgE 2 , EOS ab -29   CT sinus 04/2022 -mucosal thickening in the maxillary sinuses right greater than left with obstruction of the right ostiomeatal complex.  No air-fluid level.  Partial opacification of the right mastoid air cells.  08/24/2023 Follow up ; chronic cough, cough variant asthma, chronic sinusitis, abnormal CT chest. Patient returns for a follow-up visit.  Last seen in February 2024.  Patient is followed for chronic cough and cough variant asthma.  He remains on Wixela twice daily.  Says overall breathing is doing better.  Patient is being followed by immunology for hypogammaglobulinemia.  Recently treated for chronic sinusitis and  mastoiditis treated with a 30-day course of Augmentin  and budesonide  nasal saline rinse twice daily.  Patient was started on IV infusion of immunoglobulin followed by weekly subcutaneous infusion.  Immunology notes were reviewed-with recommendations for IgG level of 1000mg /dL .  Patient says he is doing much better.  Has really noticed a significant improvement in how he is feeling.  Sinus congestion has improved as well.  Cough has decreased.  He continues to use Tessalon  pearls twice a day most days.  He denies any fever, discolored mucus, unintentional weight loss, hemoptysis.  When hospitalized in 2023 for acute respiratory failure with bilateral pneumonia, ARDS in the setting of parainfluenza and haemophilus influenza pneumonia.  CT chest showed patchy opacities in the bilateral mid and lower lung zones with areas of consolidation at the left lung. Patient has been recommended for a high-resolution CT chest last visit that is still pending.  Patient is leaving for upcoming trip to [place] next month.   INo Known Allergies  Immunization History  Administered Date(s) Administered   DTaP / IPV 02/18/2018, 05/11/2018, 09/09/2018   Fluad Quad(high Dose 65+) 01/31/2021, 02/21/2022   HIB (PRP-T) 02/18/2018, 05/11/2018, 09/09/2018   Hepatitis B, PED/ADOLESCENT 02/18/2018, 05/11/2018, 12/09/2018   Influenza Split 04/01/2011   Influenza, High Dose Seasonal PF 04/05/2018, 04/07/2019   Influenza,inj,quad, With Preservative 02/03/2019   Janssen (J&J) SARS-COV-2 Vaccination 08/11/2019   MMR 09/09/2019   Moderna Sars-Covid-2 Vaccination 06/06/2019, 07/03/2019   Pneumococcal Conjugate-13 02/18/2018, 05/11/2018, 09/09/2018   Pneumococcal Polysaccharide-23 05/05/2010, 12/09/2018   Tdap 08/25/2013, 09/09/2018   Zoster Recombinant(Shingrix) 02/12/2017, 02/07/2019,  04/04/2019   Zoster, Live 01/23/2017, 01/18/2019, 04/04/2019    Past Medical History:  Diagnosis Date   Allergic rhinitis    Asthma     slight   Bell's palsy    GERD (gastroesophageal reflux disease)    Gout    Hypercholesteremia    Melanoma (HCC)    Osteoarthritis    Sleep apnea    Resolving   Thrombocytopenia (HCC)    Tubular adenoma     Tobacco History: Social History   Tobacco Use  Smoking Status Never   Passive exposure: Past  Smokeless Tobacco Never   Counseling given: Not Answered   Outpatient Medications Prior to Visit  Medication Sig Dispense Refill   benzonatate  (TESSALON ) 200 MG capsule Take 1 capsule (200 mg total) by mouth 3 (three) times daily as needed for cough. 270 capsule 3   bisoprolol  (ZEBETA ) 5 MG tablet Take 1 tablet (5 mg total) by mouth daily. 90 tablet 3   budesonide  (PULMICORT ) 0.5 MG/2ML nebulizer solution USE AS DIRECTED NASAL  SALINE  WASH  TWICE  DAILY 120 mL 0   Cyanocobalamin  (VITAMIN B-12 PO) Take by mouth.     diphenhydrAMINE  (BENADRYL ) 25 mg capsule Take 25 mg by mouth in the morning, at noon, and at bedtime.     Evolocumab  (REPATHA ) 140 MG/ML SOSY Inject 140 mg into the skin every 14 (fourteen) days. (Patient taking differently: Inject 140 mg into the skin every 14 (fourteen) days. Every other Monday) 3 mL 7   fluticasone -salmeterol (WIXELA INHUB) 250-50 MCG/ACT AEPB Inhale 1 puff into the lungs in the morning and at bedtime. 3 each 3   NON FORMULARY IgG Infusions     Vitamin D , Ergocalciferol , (DRISDOL ) 1.25 MG (50000 UNIT) CAPS capsule Take 1 capsule by mouth once a week 12 capsule 0   amoxicillin -clavulanate (AUGMENTIN ) 875-125 MG tablet Take 1 tablet by mouth 2 (two) times daily. (Patient not taking: Reported on 08/24/2023) 60 tablet 0   No facility-administered medications prior to visit.     Review of Systems:   Constitutional:   No  weight loss, night sweats,  Fevers, chills, +fatigue, or  lassitude.  HEENT:   No headaches,  Difficulty swallowing,  Tooth/dental problems, or  Sore throat,                No sneezing, itching, ear ache, +nasal congestion, post nasal  drip,   CV:  No chest pain,  Orthopnea, PND, swelling in lower extremities, anasarca, dizziness, palpitations, syncope.   GI  No heartburn, indigestion, abdominal pain, nausea, vomiting, diarrhea, change in bowel habits, loss of appetite, bloody stools.   Resp:   No chest wall deformity  Skin: no rash or lesions.  GU: no dysuria, change in color of urine, no urgency or frequency.  No flank pain, no hematuria   MS:  No joint pain or swelling.  No decreased range of motion.  No back pain.    Physical Exam  BP (!) 140/80 (BP Location: Left Arm, Patient Position: Sitting, Cuff Size: Normal)   Pulse 74   Temp 97.6 F (36.4 C) (Oral)   Ht 5\' 9"  (1.753 m)   Wt 191 lb 9.6 oz (86.9 kg)   SpO2 98%   BMI 28.29 kg/m   GEN: A/Ox3; pleasant , NAD, well nourished    HEENT:  [place]/AT,  EACs-clear, TMs-wnl, NOSE-clear, THROAT-clear, no lesions, no postnasal drip or exudate noted.   NECK:  Supple w/ fair ROM; no JVD; normal carotid impulses w/o  bruits; no thyromegaly or nodules palpated; no lymphadenopathy.    RESP faint bibasilar crackles  no accessory muscle use, no dullness to percussion  CARD:  RRR, no m/r/g, no peripheral edema, pulses intact, no cyanosis or clubbing.  GI:   Soft & nt; nml bowel sounds; no organomegaly or masses detected.   Musco: Warm bil, no deformities or joint swelling noted.   Neuro: alert, no focal deficits noted.    Skin: Warm, no lesions or rashes    Lab Results:  CBC      Imaging: No results found.  Administration History     None          Latest Ref Rng & Units 06/26/2022    5:04 PM  PFT Results  FVC-Predicted Pre % 89   FVC-Post L 4.10   FVC-Predicted Post % 89   Pre FEV1/FVC % % 79   Post FEV1/FCV % % 79   FEV1-Pre L 3.22   FEV1-Predicted Pre % 94   FEV1-Post L 3.25   DLCO uncorrected ml/min/mmHg 16.96   DLCO UNC% % 63   DLCO corrected ml/min/mmHg 16.55   DLCO COR %Predicted % 62   DLVA Predicted % 66   TLC L 6.23   TLC %  Predicted % 88   RV % Predicted % 87     No results found for: "NITRICOXIDE"      Assessment & Plan:   No problem-specific Assessment & Plan notes found for this encounter.     Roena Clark, NP 08/24/2023

## 2023-08-24 NOTE — Patient Instructions (Signed)
 Continue on Wixela 1 puff Twice daily , rinse after use.  Albuterol  inhaler As needed   Activity as tolerated  HRCT chest  Continue follow up with Immunology as planned  Follow up with Dr. Bertrum Brodie in 1 year and As needed

## 2023-08-25 DIAGNOSIS — D801 Nonfamilial hypogammaglobulinemia: Secondary | ICD-10-CM | POA: Insufficient documentation

## 2023-08-25 NOTE — Assessment & Plan Note (Signed)
 Cough variant asthma/mild persistent asthma.  Continue on Wixela twice daily.  Inhaler oral care discussed.  Plan  Patient Instructions  Continue on Wixela 1 puff Twice daily , rinse after use.  Albuterol  inhaler As needed   Activity as tolerated  HRCT chest  Continue follow up with Immunology as planned  Follow up with Dr. Bertrum Brodie in 1 year and As needed

## 2023-08-25 NOTE — Assessment & Plan Note (Signed)
 Clinically improved on immunoglobulin fusion.  Continue follow-up with immunology.

## 2023-08-25 NOTE — Assessment & Plan Note (Signed)
 Previous multifocal pneumonia in 2023 in the setting of parainfluenza and haemophilus influenza pneumonia with ARDS.  Previous PFT showed no significant airflow obstruction or restriction but decreased diffusing capacity.  Previous CT chest showed bilateral opacities and consolidation.  High-resolution CT chest was recommended last visit.  Will ensure scheduling.   Plan  Patient Instructions  Continue on Wixela 1 puff Twice daily , rinse after use.  Albuterol  inhaler As needed   Activity as tolerated  HRCT chest  Continue follow up with Immunology as planned  Follow up with Dr. Bertrum Brodie in 1 year and As needed

## 2023-08-25 NOTE — Assessment & Plan Note (Signed)
 Chronic sinusitis and mastoiditis in the setting of hypogammaglobulinemia-followed by immunology Has completed a 30-day course of Augmentin . Along with immunoglobulin infusion.  Clinically patient is improved.  Continue follow-up with immunology.

## 2023-08-26 ENCOUNTER — Ambulatory Visit (HOSPITAL_BASED_OUTPATIENT_CLINIC_OR_DEPARTMENT_OTHER)
Admission: RE | Admit: 2023-08-26 | Discharge: 2023-08-26 | Disposition: A | Discharge status: Home / Self Care | Source: Ambulatory Visit | Attending: Adult Health | Admitting: Adult Health

## 2023-08-26 DIAGNOSIS — R053 Chronic cough: Secondary | ICD-10-CM | POA: Insufficient documentation

## 2023-08-31 MED ORDER — BENZONATATE 200 MG PO CAPS: 200.0000 mg | ORAL_CAPSULE | Freq: Three times a day (TID) | ORAL | 2 refills | Status: DC | PRN

## 2023-08-31 NOTE — Telephone Encounter (Signed)
 Tammy,  Please see patient's message regarding Tessalon  prescription for 90 days (going out of the country soon) and advise.  Thank you.

## 2023-09-08 ENCOUNTER — Ambulatory Visit: Admitting: Nurse Practitioner

## 2023-09-08 LAB — IGG, IGA, IGM
IgG (Immunoglobin G), Serum: 950 mg/dL (ref 603–1613)
IgM (Immunoglobulin M), Srm: <5 mg/dL — ABNORMAL LOW (ref 20–172)
Immunoglobulin A, (IgA) QN, Serum: 20 mg/dL — ABNORMAL LOW (ref 61–437)

## 2023-09-09 ENCOUNTER — Other Ambulatory Visit: Payer: Self-pay | Admitting: *Deleted

## 2023-09-09 ENCOUNTER — Encounter: Payer: Self-pay | Admitting: *Deleted

## 2023-09-09 DIAGNOSIS — R918 Other nonspecific abnormal finding of lung field: Secondary | ICD-10-CM

## 2023-09-09 NOTE — Progress Notes (Signed)
 Order for CT scan without contrast placef for 3 months.  Sent patient mychart message to call the office to schedule 3 month follow up after he gets the date of his CT scan so he can schedule his office visit.  Nothing further needed.

## 2023-10-03 ENCOUNTER — Other Ambulatory Visit: Payer: Self-pay | Admitting: Cardiovascular Disease

## 2023-10-12 NOTE — Progress Notes (Unsigned)
 Cardiology Office Note:  .   Date:  10/12/2023  ID:  Joseph Powell, DOB Dec 15, 1955, MRN [id number] PCP: Rae Bugler, MD  [facility] Providers Cardiologist:  Luana Rumple, MD { History of Present Illness: .   Joseph Powell is a 68 y.o. male with PMHx of Elevated Ca Score of 2056 (08/2022),  Diastolic Dysfunction (2021 and most recent echo in 2023: EF 55-60%), HLD, DM2, PVCs, hx of chemotherapy-induced cardiomyopathy, T-cell lymphoma s/p R-CHOEP 2018 and autologous stem cell transplant 2019, hypogammaglobulinemia, OSA, asthma, history of acute respiratory failure for parainfluenza and haemophilus influenza pneumonia with ARDS June 2023, bronchiectasis, history of esophageal dilation for Schatzki's ring, left total knee replacement 2023. reports to office for follow up.   Last seen in heartcare OV 08/05/2022 with Dr. Alvis Ba.  At that time, denied any cardiac symptoms.  Discontinue carvedilol  with recent echo 2023 being negative for LVH or LA enlargement, no history of clinical congestive HF, and NYHA functional class.  Noted that coronary calcification was difficult to quantify due to motion artifact.  Ordered coronary calcium  score, which noted Ca score of 2056 in 08/2022.  Started on atorvastatin  40 mg daily.   In 10/2022 patient reported via MyChart that he stopped statin medications due to myalgias despite taking statin and magnesium  in combination. Dr. Alvis Ba recommended to stop atorvastatin  and start rosuvastatin  10 mg daily.  Noted that if same side effects, can try injectable alternatives such as Repatha  or Praluent.   In 02/2023 patient reported via MyChart that BP has been elevated 135-150/90 and noted discontinuing carvedilol  a while back as well as taken some antihistamines for allergy  relief. Dr. Alvis Ba recommended restarting bisoprolol  5 mg daily with pulmonary history and once daily as opposed to carvedilol .  Also noted that antihistamines should not increase your blood  pressure unless combined with a decongestant such as Sudafed, which can increase HR and BP.   Today, reports ### and denies ###. Reports compliance with medications.  Dietary habitats:  Activity level:  Social: Denies tobacco use/Binging ETOH/drug use   Walks around the neighborhood with his wife for approximately 2 miles in 40 minutes???  HTN  02/2023 patient reported via MyChart concerns for elevated BP since discontinuing carvedilol .  Ordered bisoprolol  5 mg daily.  BP this office visit:  Continue on bisoprolol  5 mg daily  Elevated Ca score Coronary artery calcification via CT scan HLD 08/2022: Ca score of 2056.  Started on atorvastatin  40 mg 10/2022: Discontinued atorvastatin  due to myalgia.  Started on Crestor .  08/2023: LDL 47, AST/ALT WNL Continue Crestor  10 mg  Diastolic Dysfunction  Echo in 4098 showed LVEF 50-55% with mild LVH and grade 1 diastolic dysfunction.  Echo in 2023 showed LVEF 55 to 60%, G1 DD, without any signs of LVH or significant valvular abnormalities 08/2022 discontinued carvedilol  since no signs of LVH or clinical HF.   PVC's Symptomatic in the past not requiring any medication.   Hx of chemotherapy-induced cardiomyopathy resolved spontaneously suggesting being due to Rituxan  rather than Adriamycin    Studies Reviewed: Aaron Aas       Labs reviewed:  08/2023 LDL 47, A1c 6.4, CMP WNL   CT cardiac scoring IMPRESSION: 08/2022 Coronary calcium  score of 2056. This was 58 th percentile for age and sex matched control. Consider f/u perfusion study given how high score is Somewhat difficult to distinguish calcium  in mitral annulus from LCX  ECHO IMPRESSIONS 12/2021  1. Left ventricular ejection fraction, by estimation, is 55 to 60%. The  left ventricle has normal function. The left ventricle has no regional  wall motion abnormalities. Left ventricular diastolic parameters are  consistent with Grade I diastolic dysfunction (impaired relaxation).   2. Right  ventricular systolic function is normal. The right ventricular  size is normal. Tricuspid regurgitation signal is inadequate for assessing  PA pressure.   3. The mitral valve is normal in structure. Trivial mitral valve  regurgitation. No evidence of mitral stenosis.   4. The aortic valve is tricuspid. Aortic valve regurgitation is not  visualized. No aortic stenosis is present.   5. IVC not visualized    Risk Assessment/Calculations:   {Does this patient have ATRIAL FIBRILLATION?:[id number]} No BP recorded.  {Refresh Note OR Click here to enter BP  :1}***       Physical Exam:   VS:  There were no vitals taken for this visit.   Wt Readings from Last 3 Encounters:  08/24/23 191 lb 9.6 oz (86.9 kg)  07/21/23 185 lb 11.2 oz (84.2 kg)  06/09/23 182 lb (82.6 kg)    GEN: Well nourished, well developed in no acute distress NECK: No JVD; No carotid bruits CARDIAC: ***RRR, no murmurs, rubs, gallops RESPIRATORY:  Clear to auscultation without rales, wheezing or rhonchi  ABDOMEN: Soft, non-tender, non-distended EXTREMITIES:  No edema; No deformity   ASSESSMENT AND PLAN: .   ***    {Are you ordering a CV Procedure (e.g. stress test, cath, DCCV, TEE, etc)?   Press F2        :[id number]}  Dispo: ***  Signed, Metta Actis, PA-C

## 2023-10-13 ENCOUNTER — Ambulatory Visit: Attending: Nurse Practitioner | Admitting: Physician Assistant

## 2023-10-13 ENCOUNTER — Encounter: Payer: Self-pay | Admitting: Nurse Practitioner

## 2023-10-13 VITALS — BP 128/60 | HR 69 | Ht 69.0 in | Wt 191.4 lb

## 2023-10-13 DIAGNOSIS — I2510 Atherosclerotic heart disease of native coronary artery without angina pectoris: Secondary | ICD-10-CM | POA: Diagnosis not present

## 2023-10-13 DIAGNOSIS — I5189 Other ill-defined heart diseases: Secondary | ICD-10-CM

## 2023-10-13 DIAGNOSIS — T451X5A Adverse effect of antineoplastic and immunosuppressive drugs, initial encounter: Secondary | ICD-10-CM

## 2023-10-13 DIAGNOSIS — I493 Ventricular premature depolarization: Secondary | ICD-10-CM

## 2023-10-13 DIAGNOSIS — I10 Essential (primary) hypertension: Secondary | ICD-10-CM | POA: Diagnosis not present

## 2023-10-13 DIAGNOSIS — R931 Abnormal findings on diagnostic imaging of heart and coronary circulation: Secondary | ICD-10-CM

## 2023-10-13 DIAGNOSIS — E785 Hyperlipidemia, unspecified: Secondary | ICD-10-CM | POA: Diagnosis not present

## 2023-10-13 DIAGNOSIS — T451X5D Adverse effect of antineoplastic and immunosuppressive drugs, subsequent encounter: Secondary | ICD-10-CM

## 2023-10-13 DIAGNOSIS — I427 Cardiomyopathy due to drug and external agent: Secondary | ICD-10-CM

## 2023-10-13 MED ORDER — BISOPROLOL FUMARATE 5 MG PO TABS: 5.0000 mg | ORAL_TABLET | Freq: Every day | ORAL | 3 refills | Status: AC

## 2023-10-13 NOTE — Patient Instructions (Signed)
 Medication Instructions:  Your physician recommends that you continue on your current medications as directed. Please refer to the Current Medication list given to you today.  *If you need a refill on your cardiac medications before your next appointment, please call your pharmacy*  Lab Work: NONE ordered at this time of appointment   Testing/Procedures: NONE ordered at this time of appointment    Follow-Up: At [facility], you and your health needs are our priority.  As part of our continuing mission to provide you with exceptional heart care, our providers are all part of one team.  This team includes your primary Cardiologist (physician) and Advanced Practice Providers or APPs (Physician Assistants and Nurse Practitioners) who all work together to provide you with the care you need, when you need it.  Your next appointment:   1 year(s)  Provider:   Luana Rumple, MD    We recommend signing up for the patient portal called "MyChart".  Sign up information is provided on this After Visit Summary.  MyChart is used to connect with patients for Virtual Visits (Telemedicine).  Patients are able to view lab/test results, encounter notes, upcoming appointments, etc.  Non-urgent messages can be sent to your provider as well.   To learn more about what you can do with MyChart, go to [email or web address].

## 2023-11-07 ENCOUNTER — Other Ambulatory Visit: Payer: Self-pay | Admitting: Cardiovascular Disease

## 2023-11-09 ENCOUNTER — Other Ambulatory Visit: Payer: Self-pay | Admitting: Hematology

## 2023-11-09 DIAGNOSIS — C8442 Peripheral T-cell lymphoma, not classified, intrathoracic lymph nodes: Secondary | ICD-10-CM

## 2023-12-10 ENCOUNTER — Ambulatory Visit
Admission: RE | Admit: 2023-12-10 | Discharge: 2023-12-10 | Disposition: A | Discharge status: Home / Self Care | Source: Ambulatory Visit | Attending: Adult Health | Admitting: Adult Health

## 2023-12-10 DIAGNOSIS — R918 Other nonspecific abnormal finding of lung field: Secondary | ICD-10-CM

## 2023-12-24 ENCOUNTER — Other Ambulatory Visit: Payer: Self-pay

## 2023-12-24 ENCOUNTER — Ambulatory Visit: Payer: Self-pay | Admitting: Adult Health

## 2023-12-24 DIAGNOSIS — C8442 Peripheral T-cell lymphoma, not classified, intrathoracic lymph nodes: Secondary | ICD-10-CM

## 2023-12-25 ENCOUNTER — Inpatient Hospital Stay: Payer: No Typology Code available for payment source | Attending: Hematology

## 2023-12-25 ENCOUNTER — Ambulatory Visit: Payer: Self-pay | Admitting: Allergy and Immunology

## 2023-12-25 ENCOUNTER — Inpatient Hospital Stay (HOSPITAL_BASED_OUTPATIENT_CLINIC_OR_DEPARTMENT_OTHER): Payer: No Typology Code available for payment source | Admitting: Hematology

## 2023-12-25 VITALS — BP 142/76 | HR 62 | Temp 97.0°F | Resp 20 | Wt 196.7 lb

## 2023-12-25 DIAGNOSIS — C844A Peripheral t-cell lymphoma, not elsewhere classified, in remission: Secondary | ICD-10-CM | POA: Insufficient documentation

## 2023-12-25 DIAGNOSIS — C8442 Peripheral T-cell lymphoma, not classified, intrathoracic lymph nodes: Secondary | ICD-10-CM

## 2023-12-25 DIAGNOSIS — D696 Thrombocytopenia, unspecified: Secondary | ICD-10-CM | POA: Diagnosis not present

## 2023-12-25 DIAGNOSIS — Z85820 Personal history of malignant melanoma of skin: Secondary | ICD-10-CM | POA: Diagnosis not present

## 2023-12-25 DIAGNOSIS — Z9481 Bone marrow transplant status: Secondary | ICD-10-CM | POA: Insufficient documentation

## 2023-12-25 DIAGNOSIS — Z9221 Personal history of antineoplastic chemotherapy: Secondary | ICD-10-CM | POA: Diagnosis not present

## 2023-12-25 DIAGNOSIS — Z79899 Other long term (current) drug therapy: Secondary | ICD-10-CM | POA: Diagnosis not present

## 2023-12-25 DIAGNOSIS — Z800 Family history of malignant neoplasm of digestive organs: Secondary | ICD-10-CM | POA: Diagnosis not present

## 2023-12-25 LAB — CBC WITH DIFFERENTIAL (CANCER CENTER ONLY)
Abs Immature Granulocytes: 0.1 K/uL — ABNORMAL HIGH (ref 0.00–0.07)
Basophils Absolute: 0 K/uL (ref 0.0–0.1)
Basophils Relative: 0 %
Eosinophils Absolute: 0 K/uL (ref 0.0–0.5)
Eosinophils Relative: 0 %
HCT: 46.7 % (ref 39.0–52.0)
Hemoglobin: 16.4 g/dL (ref 13.0–17.0)
Immature Granulocytes: 1 %
Lymphocytes Relative: 21 %
Lymphs Abs: 1.5 K/uL (ref 0.7–4.0)
MCH: 31.9 pg (ref 26.0–34.0)
MCHC: 35.1 g/dL (ref 30.0–36.0)
MCV: 90.9 fL (ref 80.0–100.0)
Monocytes Absolute: 2 K/uL — ABNORMAL HIGH (ref 0.1–1.0)
Monocytes Relative: 29 %
Neutro Abs: 3.4 K/uL (ref 1.7–7.7)
Neutrophils Relative %: 49 %
Platelet Count: 105 K/uL — ABNORMAL LOW (ref 150–400)
RBC: 5.14 MIL/uL (ref 4.22–5.81)
RDW: 12.6 % (ref 11.5–15.5)
WBC Count: 7.1 K/uL (ref 4.0–10.5)
nRBC: 0 % (ref 0.0–0.2)

## 2023-12-25 LAB — LACTATE DEHYDROGENASE: LDH: 164 U/L (ref 98–192)

## 2023-12-25 LAB — CMP (CANCER CENTER ONLY)
ALT: 16 U/L (ref 0–44)
AST: 25 U/L (ref 15–41)
Albumin: 4.7 g/dL (ref 3.5–5.0)
Alkaline Phosphatase: 90 U/L (ref 38–126)
Anion gap: 4 — ABNORMAL LOW (ref 5–15)
BUN: 13 mg/dL (ref 8–23)
CO2: 31 mmol/L (ref 22–32)
Calcium: 9.8 mg/dL (ref 8.9–10.3)
Chloride: 104 mmol/L (ref 98–111)
Creatinine: 0.86 mg/dL (ref 0.61–1.24)
GFR, Estimated: >60 mL/min (ref >60)
Glucose, Bld: 108 mg/dL — ABNORMAL HIGH (ref 70–99)
Potassium: 4.7 mmol/L (ref 3.5–5.1)
Sodium: 139 mmol/L (ref 135–145)
Total Bilirubin: 0.6 mg/dL (ref 0.0–1.2)
Total Protein: 7.4 g/dL (ref 6.5–8.1)

## 2023-12-31 NOTE — Progress Notes (Signed)
 HEMATOLOGY/ONCOLOGY CLINIC NOTE  Date of Service: .12/25/2023   Patient Care Team: Seabron Lenis, MD as PCP - General (Family Medicine) Croitoru, Jerel, MD as PCP - Cardiology (Cardiology)  CHIEF COMPLAINTS/PURPOSE OF CONSULTATION:   Follow-up for active surveillance of high-grade T-cell non-Hodgkin's lymphoma  CURRENT THERAPY: post BMT active surveillance  Previous Treatment; R-CHOEP x 6 cycles BEAM -Auto HSCT  HISTORY OF PRESENTING ILLNESS:  See previous note for details  INTERVAL HISTORY: Joseph Powell is a 68 y.o. male here for follow-up of his T-cell non-Hodgkin's lymphoma.  He notes he has been doing well and has no other acute new symptoms. He had a good vacation in [place] and [place]. He was recently seen by his transplant team at [facility] and no new concerns were noted. No fevers no chills no night sweats. No unexpected weight loss. No new lumps or bumps.   MEDICAL HISTORY:  Past Medical History:  Diagnosis Date   Allergic rhinitis    Asthma    slight   Bell's palsy    GERD (gastroesophageal reflux disease)    Gout    Hypercholesteremia    Melanoma (HCC)    Osteoarthritis    Sleep apnea    Resolving   Thrombocytopenia (HCC)    Tubular adenoma     SURGICAL HISTORY: Past Surgical History:  Procedure Laterality Date   ESOPHAGEAL MANOMETRY N/A 02/04/2023   Procedure: ESOPHAGEAL MANOMETRY (EM);  Surgeon: Dianna Specking, MD;  [facility]: [facility];  Service: Gastroenterology;  Laterality: N/A;   ESOPHAGOGASTRODUODENOSCOPY N/A 01/29/2023   Procedure: ESOPHAGOGASTRODUODENOSCOPY (EGD);  Surgeon: Dianna Specking, MD;  Location: [facility];  Service: Gastroenterology;  Laterality: N/A;   ESOPHAGOGASTRODUODENOSCOPY (EGD) WITH PROPOFOL  N/A 12/28/2022   Procedure: ESOPHAGOGASTRODUODENOSCOPY (EGD) WITH PROPOFOL ;  Surgeon: Dianna Specking, MD;  Location: [facility];  Service: Gastroenterology;  Laterality: N/A;   FOREIGN BODY  REMOVAL  01/29/2023   Procedure: FOREIGN BODY REMOVAL;  Surgeon: Dianna Specking, MD;  Location: [facility];  Service: Gastroenterology;;   IMPACTION REMOVAL  12/28/2022   Procedure: IMPACTION REMOVAL;  Surgeon: Dianna Specking, MD;  Location: [facility];  Service: Gastroenterology;;   IR FLUORO GUIDE PORT INSERTION RIGHT  02/17/2017   IR REMOVAL TUN ACCESS W/ PORT W/O FL MOD SED  01/05/2018   IR US  GUIDE VASC ACCESS RIGHT  02/17/2017   KNEE SURGERY  1993   MELANOMA EXCISION     NASAL RECONSTRUCTION     VASECTOMY  1999    SOCIAL HISTORY: Social History   Socioeconomic History   Marital status: Married    Spouse name: Not on file   Number of children: Not on file   Years of education: Not on file   Highest education level: Not on file  Occupational History   Occupation: Primary school teacher  Tobacco Use   Smoking status: Never    Passive exposure: Past   Smokeless tobacco: Never  Vaping Use   Vaping status: Never Used  Substance and Sexual Activity   Alcohol use: Yes    Alcohol/week: 5.0 standard drinks of alcohol    Types: 5 Glasses of wine per week    Comment: several/ week   Drug use: No   Sexual activity: Yes    Partners: Female  Other Topics Concern   Not on file  Social History Narrative   Not on file   Social Drivers of Health   Financial Resource Strain: Not on file  Food Insecurity: Not on file  Transportation Needs: Not  on file  Physical Activity: Not on file  Stress: Not on file  Social Connections: Not on file  Intimate Partner Violence: Not on file    FAMILY HISTORY: Family History  Problem Relation Age of Onset   Diabetes Mother    Asthma Mother    Gout Brother    Colon cancer Paternal Uncle    Esophageal cancer Neg Hx    Stomach cancer Neg Hx     ALLERGIES:  has no known allergies.  MEDICATIONS:  Current Outpatient Medications  Medication Sig Dispense Refill   albuterol  (VENTOLIN  HFA) 108 (90 Base) MCG/ACT inhaler Inhale 1-2 puffs into  the lungs every 6 (six) hours as needed. 8 g 2   bisoprolol  (ZEBETA ) 5 MG tablet Take 1 tablet (5 mg total) by mouth daily. 90 tablet 3   Cyanocobalamin  (VITAMIN B-12 PO) Take by mouth.     Evolocumab  (REPATHA ) 140 MG/ML SOSY INJECT 140MG  INTO THE SKIN EVERY 14 DAYS 3 mL 11   fluticasone -salmeterol (WIXELA INHUB) 250-50 MCG/ACT AEPB Inhale 1 puff into the lungs in the morning and at bedtime. 3 each 3   NON FORMULARY IgG Infusions     Vitamin D , Ergocalciferol , (DRISDOL ) 1.25 MG (50000 UNIT) CAPS capsule Take 1 capsule by mouth once a week 12 capsule 0   No current facility-administered medications for this visit.    REVIEW OF SYSTEMS:    .10 Point review of Systems was done is negative except as noted above.  PHYSICAL EXAMINATION: ECOG PERFORMANCE STATUS: 1 - Symptomatic but completely ambulatory  Vitals:   12/25/23 1329  BP: (!) 142/76  Pulse: 62  Resp: 20  Temp: (!) 97 F (36.1 C)  SpO2: 99%    Filed Weights   12/25/23 1329  Weight: 196 lb 11.2 oz (89.2 kg)   .Body mass index is 29.05 kg/m.  SABRA GENERAL:alert, in no acute distress and comfortable SKIN: no acute rashes, no significant lesions EYES: conjunctiva are pink and non-injected, sclera anicteric OROPHARYNX: MMM, no exudates, no oropharyngeal erythema or ulceration NECK: supple, no JVD LYMPH:  no palpable lymphadenopathy in the cervical, axillary or inguinal regions LUNGS: clear to auscultation b/l with normal respiratory effort HEART: regular rate & rhythm ABDOMEN:  normoactive bowel sounds , non tender, not distended.  No palpable hepatosplenomegaly Extremity: no pedal edema PSYCH: alert & oriented x 3 with fluent speech NEURO: no focal motor/sensory deficits   LABORATORY DATA:  I have reviewed the data as listed  .    Latest Ref Rng & Units 12/25/2023   12:25 PM 01/29/2023    2:22 PM 12/28/2022    2:55 AM  CBC  WBC 4.0 - 10.5 K/uL 7.1  12.1  10.3   Hemoglobin 13.0 - 17.0 g/dL 83.5  82.6  85.1    Hematocrit 39.0 - 52.0 % 46.7  52.3  44.8   Platelets 150 - 400 K/uL 105  129  133   .CBC    Component Value Date/Time   WBC 7.1 12/25/2023 1225   WBC 12.1 (H) 01/29/2023 1422   RBC 5.14 12/25/2023 1225   HGB 16.4 12/25/2023 1225   HGB 8.6 (L) 05/01/2017 0815   HCT 46.7 12/25/2023 1225   HCT 27.6 (L) 05/01/2017 0815   PLT 105 (L) 12/25/2023 1225   PLT 30 (L) 05/01/2017 0815   MCV 90.9 12/25/2023 1225   MCV 87.3 05/01/2017 0815   MCH 31.9 12/25/2023 1225   MCHC 35.1 12/25/2023 1225   RDW 12.6 12/25/2023 1225  RDW 18.4 (H) 05/01/2017 0815   LYMPHSABS 1.5 12/25/2023 1225   LYMPHSABS 1.0 05/01/2017 0815   MONOABS 2.0 (H) 12/25/2023 1225   MONOABS 1.8 (H) 05/01/2017 0815   EOSABS 0.0 12/25/2023 1225   EOSABS 0.0 05/01/2017 0815   BASOSABS 0.0 12/25/2023 1225   BASOSABS 0.3 (H) 05/01/2017 0815       Latest Ref Rng & Units 12/25/2023   12:25 PM 01/29/2023    2:22 PM 12/28/2022    2:55 AM  CMP  Glucose 70 - 99 mg/dL 891  875  569   BUN 8 - 23 mg/dL 13  9  12    Creatinine 0.61 - 1.24 mg/dL 9.13  9.25  9.19   Sodium 135 - 145 mmol/L 139  139  138   Potassium 3.5 - 5.1 mmol/L 4.7  4.3  3.5   Chloride 98 - 111 mmol/L 104  102  108   CO2 22 - 32 mmol/L 31  27  21    Calcium  8.9 - 10.3 mg/dL 9.8  9.8  8.8   Total Protein 6.5 - 8.1 g/dL 7.4     Total Bilirubin 0.0 - 1.2 mg/dL 0.6     Alkaline Phos 38 - 126 U/L 90     AST 15 - 41 U/L 25     ALT 0 - 44 U/L 16      . Lab Results  Component Value Date   LDH 164 12/25/2023       06/22/17 Cytogenetics Report:   06/22/17 Flow Cytometry:    06/22/17 BM Bx:    RADIOGRAPHIC STUDIES:  I have personally reviewed the radiological images as listed and agreed with the findings in the report. CT Chest Wo Contrast Result Date: 12/22/2023 CLINICAL DATA:  Follow-up lung nodule EXAM: CT CHEST WITHOUT CONTRAST TECHNIQUE: Multidetector CT imaging of the chest was performed following the standard protocol without IV contrast.  RADIATION DOSE REDUCTION: This exam was performed according to the departmental dose-optimization program which includes automated exposure control, adjustment of the mA and/or kV according to patient size and/or use of iterative reconstruction technique. COMPARISON:  Chest CT 08/26/2023, chest CT 10/17/2021 FINDINGS: Cardiovascular: Limited evaluation without intravenous contrast. Mild aortic atherosclerosis. No aneurysm. Multi vessel coronary vascular calcification. Normal cardiac size. No pericardial effusion Mediastinum/Nodes: Patent trachea. No thyroid  mass. No suspicious lymph nodes. Esophagus within normal limits. Lungs/Pleura: Mild scarring and bronchiectasis in the lower lobes. Resolution of previously noted left apical ground-glass pulmonary nodule. Resolution of heterogeneous nodular consolidation at the lingula. Mild bandlike scarring at the lingula. Upper Abdomen: No acute finding Musculoskeletal: No acute osseous abnormality IMPRESSION: 1. Resolution of previously noted left apical ground-glass pulmonary nodule and heterogeneous nodular consolidation at the lingula. Findings presumably related to infectious or inflammatory process. 2. Mild scarring and bronchiectasis in the lower lobes. Mild scarring at the lingula 3. Aortic atherosclerosis. Aortic Atherosclerosis (ICD10-I70.0). Electronically Signed   By: Luke Bun M.D.   On: 12/22/2023 22:12     11/13/17 PET/CT    ASSESSMENT & PLAN:    Joseph Powell is a 68 y.o. 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 Patient has some chronic thrombocytopenia 85-125k for a few years. Platelets upon initial visit are down to 36k in the setting of newly diagnosed lymphoma and treatment with R-CHOEP and from hypersplenism but  then improved. PLt counts had normalized after  treatment of the lymphoma pre-transplant Has continued to have some low-grade thrombocytopenia posttransplant.  PLAN: Discussed lab results done today in detail with the patient. CBC stable other than mild chronic thrombocytopenia at 105k CMP within normal limits LDH within normal limits at 164 Patient is now more than 6 years out from his transplant and continues to be in CR. He has no lab or clinical findings suggestive of lymphoma recurrence/progression. He recently had a CT of the chest to follow-up on a lung nodule by pulmonology.  Lung nodule was noted to have resolved. Patient does continue to see the bone marrow transplant team at [facility] yearly versus recently seen by them.  We discussed that it might be dependent for both of us  to continue seeing him and he was recommended to continue his active surveillance with the transplant team once a year and with his PCP. We shall see him back as needed. Follow-up with PCP for age-appropriate cancer screening and vaccinations.  -#4 History of Melanoma, 2009 -Follow-up with dermatology.   #8 Patient Active Problem List   Diagnosis Date Noted   Hypogammaglobulinemia (HCC) 08/25/2023   Food impaction of esophagus 12/28/2022   Sinusitis 09/01/2022   Atherosclerosis of aorta (HCC) 08/05/2022   Coronary artery calcification seen on CAT scan 08/05/2022   Elevated blood pressure reading 10/31/2021   Multifocal pneumonia    Non-recurrent acute suppurative otitis media of both ears without spontaneous rupture of tympanic membranes    Haemophilus infection    Penicillin allergy     Malnutrition of moderate degree 10/18/2021   Pneumonia of both lungs due to infectious organism 10/17/2021   Hypokalemia 10/17/2021   Acute respiratory failure with hypoxia (HCC) 10/17/2021   Sepsis due to pneumonia (HCC) 10/16/2021   Abdominal pain 07/22/2021   Type 2 diabetes mellitus without complication, without long-term current use of  insulin  (HCC) 03/05/2021   Mixed hyperlipidemia 03/05/2021   Encounter for general adult medical examination with abnormal findings 03/05/2021   Non-seasonal allergic rhinitis due to pollen 03/05/2021   Cough variant asthma vs UACS 01/27/2017   Mild asthma 08/05/2016   DJD (degenerative joint disease) 08/05/2016   HLD (hyperlipidemia) 07/27/2016   GERD without esophagitis 07/27/2016   Gout 07/27/2016   OSA (obstructive sleep apnea) 09/19/2013   Thrombocytopenia (HCC) 04/14/2011   SCHATZKI'S RING 08/21/2009  -Continue follow-up with primary to manage other medical issues  FOLLOW-UP: Continue yearly follow-up with [facility] transplant team Return to clinic with PCP  .The total time spent in the appointment was 30 minutes* .  All of the patient's questions were answered with apparent satisfaction. The patient knows to call the clinic with any problems, questions or concerns.   Emaline Saran MD MS AAHIVMS [facility] Hematology/Oncology Physician [facility]  .*Total Encounter Time as defined by the Centers for Medicare and Medicaid Services includes, in addition to the face-to-face time of a patient visit (documented in the note above) non-face-to-face time: obtaining and reviewing outside history, ordering and reviewing medications, tests or procedures, care coordination (communications with other health care professionals or caregivers) and documentation in the medical record.

## 2024-01-29 ENCOUNTER — Other Ambulatory Visit: Payer: Self-pay | Admitting: Hematology

## 2024-01-29 DIAGNOSIS — C8442 Peripheral T-cell lymphoma, not classified, intrathoracic lymph nodes: Secondary | ICD-10-CM

## 2024-04-07 ENCOUNTER — Telehealth: Payer: Self-pay | Admitting: Allergy and Immunology

## 2024-04-07 ENCOUNTER — Other Ambulatory Visit: Payer: Self-pay | Admitting: *Deleted

## 2024-04-07 ENCOUNTER — Other Ambulatory Visit: Payer: Self-pay | Admitting: Family Medicine

## 2024-04-07 MED ORDER — BUDESONIDE 0.5 MG/2ML IN SUSP: RESPIRATORY_TRACT | 1 refills | Status: DC

## 2024-04-07 NOTE — Telephone Encounter (Signed)
 Pt called asking if needs to still continue his nasal rinse steroid until he comes in for his follow up  if so then he needs a new prescription for it.

## 2024-04-07 NOTE — Telephone Encounter (Signed)
 Prescription for Budesonide  has been sent in with directions to use twice daily with nasal saline wash. Called patient and left a voicemail asking for a return call to inform.

## 2024-04-19 ENCOUNTER — Other Ambulatory Visit: Payer: Self-pay | Admitting: Hematology

## 2024-04-19 DIAGNOSIS — C8442 Peripheral T-cell lymphoma, not classified, intrathoracic lymph nodes: Secondary | ICD-10-CM

## 2024-05-24 ENCOUNTER — Encounter: Payer: Self-pay | Admitting: Allergy and Immunology

## 2024-05-24 ENCOUNTER — Ambulatory Visit: Admitting: Allergy and Immunology

## 2024-05-24 ENCOUNTER — Other Ambulatory Visit: Payer: Self-pay

## 2024-05-24 VITALS — BP 120/72 | HR 65 | Temp 97.2°F | Resp 18 | Ht 71.0 in | Wt 193.1 lb

## 2024-05-24 DIAGNOSIS — D801 Nonfamilial hypogammaglobulinemia: Secondary | ICD-10-CM

## 2024-05-24 DIAGNOSIS — Z9481 Bone marrow transplant status: Secondary | ICD-10-CM | POA: Diagnosis not present

## 2024-05-24 DIAGNOSIS — J449 Chronic obstructive pulmonary disease, unspecified: Secondary | ICD-10-CM

## 2024-05-24 MED ORDER — BUDESONIDE 0.5 MG/2ML IN SUSP: RESPIRATORY_TRACT | 1 refills | Status: DC

## 2024-05-24 MED ORDER — FLUTICASONE-SALMETEROL 250-50 MCG/ACT IN AEPB: 1.0000 | INHALATION_SPRAY | Freq: Two times a day (BID) | RESPIRATORY_TRACT | 1 refills | Status: AC

## 2024-05-24 NOTE — Progress Notes (Unsigned)
 "  [place] - [place] - [place] - [place] - [place]   Follow-up Note  Referring Provider: Seabron Lenis, MD Primary Provider: Seabron Lenis, MD Date of Office Visit: 05/24/2024  Subjective:   Joseph Powell (DOB: March 14, 1956) is a 69 y.o. male [facility] in re-evaluation of the following:  HPI: Joseph Powell presents to this clinic in evaluation of hypogammaglobulinemia with chronic sinusitis/mastoiditis in the context of autologous stem cell bone marrow transplant for T-cell lymphoma treated with subcutaneous immunoglobulin started February 2025.  I last saw him in this clinic 21 July 2023.  From a infectious standpoint he has really done very well.  He has not required a antibiotic to treat any type of infection.  He continues on his immunoglobulin infusions every week without adverse effect.  He still has some cough.  This cough does not really disturb him very much.  He does not really have any shortness of breath or chest tightness associated with this cough.  He does have some drainage occasionally associated with this cough.  His sense of smell is diminished although he can smell food.  He has been consistently using nasal budesonide  washes twice a day and continues to use inhaled anti-inflammatory agents twice a day on a consistent basis.  Rarely does not use any albuterol .  Allergies as of 05/24/2024   No Known Allergies      Medication List    albuterol  108 (90 Base) MCG/ACT inhaler Commonly known as: VENTOLIN  HFA Inhale 1-2 puffs into the lungs every 6 (six) hours as needed.   benzonatate  200 MG capsule Commonly known as: TESSALON  Take 200 mg by mouth 3 (three) times daily as needed.   bisoprolol  5 MG tablet Commonly known as: ZEBETA  Take 1 tablet (5 mg total) by mouth daily.   budesonide  0.5 MG/2ML nebulizer solution Commonly known as: PULMICORT  Use as directed with nasal saline wash two times daily.    fluticasone -salmeterol 250-50 MCG/ACT Aepb Commonly known as: Wixela Inhub Inhale 1 puff into the lungs in the morning and at bedtime.   NON FORMULARY IgG Infusions   Repatha  140 MG/ML Sosy Generic drug: Evolocumab  INJECT 140MG  INTO THE SKIN EVERY 14 DAYS   VITAMIN B-12 PO Take by mouth.   Vitamin D  (Ergocalciferol ) 1.25 MG (50000 UNIT) Caps capsule Commonly known as: DRISDOL  Take 1 capsule by mouth once a week    Past Medical History:  Diagnosis Date   Allergic rhinitis    Asthma    slight   Bell's palsy    GERD (gastroesophageal reflux disease)    Gout    Hypercholesteremia    Melanoma (HCC)    Osteoarthritis    Sleep apnea    Resolving   Thrombocytopenia    Tubular adenoma     Past Surgical History:  Procedure Laterality Date   ESOPHAGEAL MANOMETRY N/A 02/04/2023   Procedure: ESOPHAGEAL MANOMETRY (EM);  Surgeon: Dianna Specking, MD;  [facility]: [facility];  Service: Gastroenterology;  Laterality: N/A;   ESOPHAGOGASTRODUODENOSCOPY N/A 01/29/2023   Procedure: ESOPHAGOGASTRODUODENOSCOPY (EGD);  Surgeon: Dianna Specking, MD;  Location: [facility];  Service: Gastroenterology;  Laterality: N/A;   ESOPHAGOGASTRODUODENOSCOPY (EGD) WITH PROPOFOL  N/A 12/28/2022   Procedure: ESOPHAGOGASTRODUODENOSCOPY (EGD) WITH PROPOFOL ;  Surgeon: Dianna Specking, MD;  [facility]: [facility];  Service: Gastroenterology;  Laterality: N/A;   FOREIGN BODY REMOVAL  01/29/2023   Procedure: FOREIGN BODY REMOVAL;  Surgeon: Dianna Specking, MD;  [facility]: [facility];  Service: Gastroenterology;;   BERLINDA  REMOVAL  12/28/2022   Procedure: IMPACTION REMOVAL;  Surgeon: Dianna Specking, MD;  [facility]: [facility];  Service: Gastroenterology;;   IR FLUORO GUIDE PORT INSERTION RIGHT  02/17/2017   IR REMOVAL TUN ACCESS W/ PORT W/O FL MOD SED  01/05/2018   IR US  GUIDE VASC ACCESS RIGHT  02/17/2017   KNEE SURGERY  1993   MELANOMA EXCISION     NASAL RECONSTRUCTION     VASECTOMY  1999     Review of systems negative except as noted in HPI / PMHx or noted below:  Review of Systems  Constitutional: Negative.   HENT: Negative.    Eyes: Negative.   Respiratory: Negative.    Cardiovascular: Negative.   Gastrointestinal: Negative.   Genitourinary: Negative.   Musculoskeletal: Negative.   Skin: Negative.   Neurological: Negative.   Endo/Heme/Allergies: Negative.   Psychiatric/Behavioral: Negative.       Objective:   Vitals:   05/24/24 0915  BP: 120/72  Pulse: 65  Resp: 18  Temp: (!) 97.2 F (36.2 C)  SpO2: 97%   Height: 5' 11 (180.3 cm)  Weight: 193 lb 1.6 oz (87.6 kg)   Physical Exam Constitutional:      Appearance: He is not diaphoretic.     Comments: Slightly nasal voice  HENT:     Head: Normocephalic.     Right Ear: Tympanic membrane, ear canal and external ear normal.     Left Ear: Tympanic membrane, ear canal and external ear normal.     Nose: Nose normal. No mucosal edema or rhinorrhea.     Mouth/Throat:     Pharynx: Uvula midline. No oropharyngeal exudate.  Eyes:     Conjunctiva/sclera: Conjunctivae normal.  Neck:     Thyroid : No thyromegaly.     Trachea: Trachea normal. No tracheal tenderness or tracheal deviation.  Cardiovascular:     Rate and Rhythm: Normal rate and regular rhythm.     Heart sounds: Normal heart sounds, S1 normal and S2 normal. No murmur heard. Pulmonary:     Effort: No respiratory distress.     Breath sounds: Normal breath sounds. No stridor. No wheezing or rales.  Lymphadenopathy:     Head:     Right side of head: No tonsillar adenopathy.     Left side of head: No tonsillar adenopathy.     Cervical: No cervical adenopathy.  Skin:    Findings: No erythema or rash.     Nails: There is no clubbing.  Neurological:     Mental Status: He is alert.     Diagnostics:    Spirometry was performed and demonstrated an FEV1 of 1.85 at 55 % of predicted. FEV1/FVC = 0.53  Results of blood tests obtained 07 Sep 2023  identifies IgG 950 MGs/DL, IgA 20 mg/DL, IgM less than 5 MGs/DL.  Results of a chest CT scan obtained 10 December 2023 identifies the following:  Cardiovascular: Limited evaluation without intravenous contrast. Mild aortic atherosclerosis. No aneurysm. Multi vessel coronary vascular calcification. Normal cardiac size. No pericardial effusion   Mediastinum/Nodes: Patent trachea. No thyroid  mass. No suspicious lymph nodes. Esophagus within normal limits.   Lungs/Pleura: Mild scarring and bronchiectasis in the lower lobes. Resolution of previously noted left apical ground-glass pulmonary nodule. Resolution of heterogeneous nodular consolidation at the lingula. Mild bandlike scarring at the lingula.  Assessment and Plan:   1. Hypogammaglobulinemia   2. History of bone marrow transplant (HCC)   3. Obstructive lung disease (HCC)    1. Prevent airway infection:  A.  Subcutaneous immunoglobulin infusions Cuvitru 10 grams weekly   2. Prevent airway inflammation:   A. Budesonide  0.5/200 ml nasal saline wash 2 times per day  B. Advair 250 - 1 inhalation 2 times per day (empty lungs)  C. Albuterol  - 2 inhalations every 4-6 hours if needed  2.  Check IgA/G/M. Change dose???  3. Influenza = Tamiflu. Covid = Paxlovid  4. Return to clinic in 6 months or earlier if problem  Joseph Powell appears to be doing pretty well regarding his airway in regard to both infection and inflammation on his current plan of using subcutaneous immunoglobulin infusions every week with Hizentra and using anti-inflammatory agents for both his upper and lower airway.  I am going to check his immunoglobulin levels and we may need to change his dose of subcutaneous immunoglobulin infusion based upon those results.  I will contact him with the results once they are available for review.  He will continue on the plan noted above and we will see him back in this clinic in 6 months or earlier if there is a problem.  Joseph Denis,  MD Allergy  / Immunology [facility] "

## 2024-05-24 NOTE — Patient Instructions (Addendum)
" °  1. Prevent airway infection:  A.  Subcutaneous immunoglobulin infusions every week  2. Prevent airway inflammation:   A. Budesonide  0.5/200 ml nasal saline wash 2 times per day  B. Advair 250 - 1 inhalation 2 times per day (empty lungs)  C. Albuterol  - 2 inhalations every 4-6 hours if needed  2.  Check IgA/G/M. Change dose???  3. Influenza = Tamiflu. Covid = Paxlovid  4. Return to clinic in 6 months or earlier if problem "

## 2024-05-25 ENCOUNTER — Ambulatory Visit: Payer: Self-pay | Admitting: Allergy and Immunology

## 2024-05-25 ENCOUNTER — Encounter: Payer: Self-pay | Admitting: Allergy and Immunology

## 2024-05-25 LAB — IGG, IGA, IGM
IgG (Immunoglobin G), Serum: 1069 mg/dL (ref 603–1613)
IgM (Immunoglobulin M), Srm: <5 mg/dL — ABNORMAL LOW (ref 20–172)
Immunoglobulin A, (IgA) QN, Serum: 51 mg/dL — ABNORMAL LOW (ref 61–437)

## 2024-05-25 NOTE — Addendum Note (Signed)
 Addended by: OTHA MADELIN HERO on: 05/25/2024 08:32 AM   Modules accepted: Orders

## 2024-10-17 ENCOUNTER — Ambulatory Visit: Admitting: Cardiovascular Disease

## 2024-11-22 ENCOUNTER — Ambulatory Visit: Admitting: Allergy and Immunology
